# Patient Record
Sex: Female | Born: 1970 | Race: White | Hispanic: No | Smoking: Former smoker
Health system: Southern US, Community
[De-identification: ages and names within clinical notes are randomized; demographics above are authoritative.]

## PROBLEM LIST (undated history)

## (undated) DIAGNOSIS — G4700 Insomnia, unspecified: Secondary | ICD-10-CM

## (undated) DIAGNOSIS — F32A Depression, unspecified: Secondary | ICD-10-CM

## (undated) DIAGNOSIS — K1379 Other lesions of oral mucosa: Secondary | ICD-10-CM

## (undated) DIAGNOSIS — C801 Malignant (primary) neoplasm, unspecified: Secondary | ICD-10-CM

## (undated) DIAGNOSIS — J9610 Chronic respiratory failure, unspecified whether with hypoxia or hypercapnia: Secondary | ICD-10-CM

## (undated) DIAGNOSIS — G8929 Other chronic pain: Secondary | ICD-10-CM

## (undated) DIAGNOSIS — K219 Gastro-esophageal reflux disease without esophagitis: Secondary | ICD-10-CM

## (undated) DIAGNOSIS — F419 Anxiety disorder, unspecified: Secondary | ICD-10-CM

## (undated) DIAGNOSIS — J449 Chronic obstructive pulmonary disease, unspecified: Secondary | ICD-10-CM

## (undated) DIAGNOSIS — F329 Major depressive disorder, single episode, unspecified: Secondary | ICD-10-CM

## (undated) DIAGNOSIS — J309 Allergic rhinitis, unspecified: Secondary | ICD-10-CM

## (undated) DIAGNOSIS — M25569 Pain in unspecified knee: Secondary | ICD-10-CM

## (undated) DIAGNOSIS — M1990 Unspecified osteoarthritis, unspecified site: Secondary | ICD-10-CM

## (undated) DIAGNOSIS — M549 Dorsalgia, unspecified: Secondary | ICD-10-CM

## (undated) DIAGNOSIS — E079 Disorder of thyroid, unspecified: Secondary | ICD-10-CM

## (undated) HISTORY — PX: CHOLECYSTECTOMY: SHX55

## (undated) HISTORY — PX: TONSILLECTOMY: SUR1361

## (undated) HISTORY — PX: ABDOMINAL HYSTERECTOMY: SHX81

## (undated) HISTORY — PX: JOINT REPLACEMENT: SHX530

## (undated) HISTORY — PX: PORTACATH PLACEMENT: SHX2246

## (undated) HISTORY — PX: KNEE SURGERY: SHX244

---

## 1999-10-10 ENCOUNTER — Emergency Department (HOSPITAL_COMMUNITY): Admission: EM | Admit: 1999-10-10 | Discharge: 1999-10-10 | Payer: Self-pay | Admitting: Emergency Medicine

## 1999-10-29 ENCOUNTER — Emergency Department (HOSPITAL_COMMUNITY): Admission: EM | Admit: 1999-10-29 | Discharge: 1999-10-29 | Payer: Self-pay | Admitting: Emergency Medicine

## 2000-10-11 ENCOUNTER — Encounter: Payer: Self-pay | Admitting: Emergency Medicine

## 2000-10-11 ENCOUNTER — Emergency Department (HOSPITAL_COMMUNITY): Admission: EM | Admit: 2000-10-11 | Discharge: 2000-10-11 | Payer: Self-pay | Admitting: Emergency Medicine

## 2000-11-22 ENCOUNTER — Encounter: Payer: Self-pay | Admitting: Emergency Medicine

## 2000-11-22 ENCOUNTER — Emergency Department (HOSPITAL_COMMUNITY): Admission: EM | Admit: 2000-11-22 | Discharge: 2000-11-22 | Payer: Self-pay | Admitting: Emergency Medicine

## 2000-11-25 ENCOUNTER — Encounter: Payer: Self-pay | Admitting: *Deleted

## 2000-11-25 ENCOUNTER — Encounter (INDEPENDENT_AMBULATORY_CARE_PROVIDER_SITE_OTHER): Payer: Self-pay | Admitting: *Deleted

## 2000-11-25 ENCOUNTER — Inpatient Hospital Stay (HOSPITAL_COMMUNITY): Admission: EM | Admit: 2000-11-25 | Discharge: 2000-11-28 | Payer: Self-pay | Admitting: Emergency Medicine

## 2003-12-31 ENCOUNTER — Emergency Department (HOSPITAL_COMMUNITY): Admission: EM | Admit: 2003-12-31 | Discharge: 2003-12-31 | Payer: Self-pay | Admitting: Emergency Medicine

## 2004-02-27 ENCOUNTER — Emergency Department (HOSPITAL_COMMUNITY): Admission: EM | Admit: 2004-02-27 | Discharge: 2004-02-27 | Payer: Self-pay | Admitting: Emergency Medicine

## 2007-02-04 ENCOUNTER — Emergency Department (HOSPITAL_COMMUNITY): Admission: EM | Admit: 2007-02-04 | Discharge: 2007-02-04 | Payer: Self-pay | Admitting: Emergency Medicine

## 2007-02-26 ENCOUNTER — Ambulatory Visit (HOSPITAL_BASED_OUTPATIENT_CLINIC_OR_DEPARTMENT_OTHER): Admission: RE | Admit: 2007-02-26 | Discharge: 2007-02-26 | Payer: Self-pay | Admitting: Orthopedic Surgery

## 2007-03-19 ENCOUNTER — Encounter: Admission: RE | Admit: 2007-03-19 | Discharge: 2007-06-17 | Payer: Self-pay | Admitting: Orthopedic Surgery

## 2007-05-26 ENCOUNTER — Emergency Department (HOSPITAL_COMMUNITY): Admission: EM | Admit: 2007-05-26 | Discharge: 2007-05-26 | Payer: Self-pay | Admitting: Emergency Medicine

## 2009-01-02 ENCOUNTER — Ambulatory Visit: Payer: Self-pay | Admitting: Interventional Radiology

## 2009-01-02 ENCOUNTER — Emergency Department (HOSPITAL_BASED_OUTPATIENT_CLINIC_OR_DEPARTMENT_OTHER): Admission: EM | Admit: 2009-01-02 | Discharge: 2009-01-02 | Payer: Self-pay | Admitting: Emergency Medicine

## 2009-08-28 ENCOUNTER — Emergency Department (HOSPITAL_BASED_OUTPATIENT_CLINIC_OR_DEPARTMENT_OTHER): Admission: EM | Admit: 2009-08-28 | Discharge: 2009-08-28 | Payer: Self-pay | Admitting: Emergency Medicine

## 2010-02-27 ENCOUNTER — Emergency Department (HOSPITAL_COMMUNITY): Admission: EM | Admit: 2010-02-27 | Discharge: 2010-02-27 | Payer: Self-pay | Admitting: Emergency Medicine

## 2010-11-05 LAB — DIFFERENTIAL
Basophils Absolute: 0 10*3/uL (ref 0.0–0.1)
Basophils Relative: 1 % (ref 0–1)
Lymphocytes Relative: 31 % (ref 12–46)
Neutro Abs: 2.7 10*3/uL (ref 1.7–7.7)

## 2010-11-05 LAB — COMPREHENSIVE METABOLIC PANEL
Alkaline Phosphatase: 57 U/L (ref 39–117)
BUN: 7 mg/dL (ref 6–23)
CO2: 25 mEq/L (ref 19–32)
Chloride: 108 mEq/L (ref 96–112)
Creatinine, Ser: 0.83 mg/dL (ref 0.4–1.2)
GFR calc non Af Amer: >60 mL/min (ref >60)
Glucose, Bld: 103 mg/dL — ABNORMAL HIGH (ref 70–99)
Total Bilirubin: 0.8 mg/dL (ref 0.3–1.2)

## 2010-11-05 LAB — CBC
HCT: 34 % — ABNORMAL LOW (ref 36.0–46.0)
Hemoglobin: 11.5 g/dL — ABNORMAL LOW (ref 12.0–15.0)
MCH: 31.9 pg (ref 26.0–34.0)
MCV: 94.4 fL (ref 78.0–100.0)
WBC: 4.9 10*3/uL (ref 4.0–10.5)

## 2010-11-05 LAB — LIPASE, BLOOD: Lipase: 31 U/L (ref 11–59)

## 2010-11-05 LAB — URINALYSIS, ROUTINE W REFLEX MICROSCOPIC
Ketones, ur: NEGATIVE mg/dL
Nitrite: NEGATIVE
Protein, ur: NEGATIVE mg/dL

## 2010-11-08 ENCOUNTER — Other Ambulatory Visit: Payer: Self-pay | Admitting: Family Medicine

## 2010-11-08 DIAGNOSIS — Z09 Encounter for follow-up examination after completed treatment for conditions other than malignant neoplasm: Secondary | ICD-10-CM

## 2010-11-28 LAB — DIFFERENTIAL
Basophils Absolute: 0.1 10*3/uL (ref 0.0–0.1)
Basophils Relative: 1 % (ref 0–1)
Lymphocytes Relative: 20 % (ref 12–46)
Monocytes Absolute: 0.4 10*3/uL (ref 0.1–1.0)
Neutro Abs: 5.6 10*3/uL (ref 1.7–7.7)
Neutrophils Relative %: 72 % (ref 43–77)

## 2010-11-28 LAB — URINALYSIS, ROUTINE W REFLEX MICROSCOPIC
Glucose, UA: NEGATIVE mg/dL
Ketones, ur: NEGATIVE mg/dL
Nitrite: NEGATIVE
Protein, ur: NEGATIVE mg/dL
Urobilinogen, UA: 0.2 mg/dL (ref 0.0–1.0)

## 2010-11-28 LAB — SEDIMENTATION RATE: Sed Rate: 27 mm/hr — ABNORMAL HIGH (ref 0–22)

## 2010-11-28 LAB — BASIC METABOLIC PANEL
Calcium: 9 mg/dL (ref 8.4–10.5)
Creatinine, Ser: 0.8 mg/dL (ref 0.4–1.2)
GFR calc Af Amer: >60 mL/min (ref >60)
GFR calc non Af Amer: >60 mL/min (ref >60)
Glucose, Bld: 87 mg/dL (ref 70–99)
Sodium: 140 mEq/L (ref 135–145)

## 2010-11-28 LAB — PREGNANCY, URINE: Preg Test, Ur: NEGATIVE

## 2010-11-28 LAB — CBC
Hemoglobin: 11.9 g/dL — ABNORMAL LOW (ref 12.0–15.0)
MCHC: 32.8 g/dL (ref 30.0–36.0)
RBC: 4.15 MIL/uL (ref 3.87–5.11)
RDW: 15.2 % (ref 11.5–15.5)

## 2010-12-18 ENCOUNTER — Emergency Department (HOSPITAL_COMMUNITY)
Admission: EM | Admit: 2010-12-18 | Discharge: 2010-12-18 | Disposition: A | Discharge status: Home / Self Care | Payer: Medicaid Other | Attending: Emergency Medicine | Admitting: Emergency Medicine

## 2010-12-18 ENCOUNTER — Emergency Department (HOSPITAL_COMMUNITY): Payer: Medicaid Other

## 2010-12-18 DIAGNOSIS — R197 Diarrhea, unspecified: Secondary | ICD-10-CM | POA: Insufficient documentation

## 2010-12-18 DIAGNOSIS — E039 Hypothyroidism, unspecified: Secondary | ICD-10-CM | POA: Insufficient documentation

## 2010-12-18 DIAGNOSIS — J45909 Unspecified asthma, uncomplicated: Secondary | ICD-10-CM | POA: Insufficient documentation

## 2010-12-18 DIAGNOSIS — R112 Nausea with vomiting, unspecified: Secondary | ICD-10-CM | POA: Insufficient documentation

## 2010-12-18 DIAGNOSIS — K5732 Diverticulitis of large intestine without perforation or abscess without bleeding: Secondary | ICD-10-CM | POA: Insufficient documentation

## 2010-12-18 DIAGNOSIS — F341 Dysthymic disorder: Secondary | ICD-10-CM | POA: Insufficient documentation

## 2010-12-18 DIAGNOSIS — R109 Unspecified abdominal pain: Secondary | ICD-10-CM | POA: Insufficient documentation

## 2010-12-18 DIAGNOSIS — Z79899 Other long term (current) drug therapy: Secondary | ICD-10-CM | POA: Insufficient documentation

## 2010-12-18 LAB — HEPATIC FUNCTION PANEL
Albumin: 3.9 g/dL (ref 3.5–5.2)
Bilirubin, Direct: <0.1 mg/dL (ref 0.0–0.3)
Total Bilirubin: 0.8 mg/dL (ref 0.3–1.2)

## 2010-12-18 LAB — POCT PREGNANCY, URINE: Preg Test, Ur: NEGATIVE

## 2010-12-18 LAB — BASIC METABOLIC PANEL
BUN: 3 mg/dL — ABNORMAL LOW (ref 6–23)
Chloride: 110 mEq/L (ref 96–112)
GFR calc Af Amer: >60 mL/min (ref >60)
GFR calc non Af Amer: >60 mL/min (ref >60)
Potassium: 3.2 mEq/L — ABNORMAL LOW (ref 3.5–5.1)

## 2010-12-18 LAB — DIFFERENTIAL
Basophils Absolute: 0 10*3/uL (ref 0.0–0.1)
Basophils Relative: 0 % (ref 0–1)
Eosinophils Absolute: 0.5 10*3/uL (ref 0.0–0.7)
Eosinophils Relative: 6 % — ABNORMAL HIGH (ref 0–5)
Neutrophils Relative %: 60 % (ref 43–77)

## 2010-12-18 LAB — URINALYSIS, ROUTINE W REFLEX MICROSCOPIC
Ketones, ur: 15 mg/dL — AB
Nitrite: NEGATIVE
Specific Gravity, Urine: 1.025 (ref 1.005–1.030)
Urobilinogen, UA: 0.2 mg/dL (ref 0.0–1.0)
pH: 6 (ref 5.0–8.0)

## 2010-12-18 LAB — CBC
MCV: 90.9 fL (ref 78.0–100.0)
Platelets: 277 10*3/uL (ref 150–400)
RBC: 4.28 MIL/uL (ref 3.87–5.11)
RDW: 14.2 % (ref 11.5–15.5)
WBC: 7.8 10*3/uL (ref 4.0–10.5)

## 2010-12-18 LAB — LIPASE, BLOOD: Lipase: 35 U/L (ref 11–59)

## 2011-01-02 NOTE — Op Note (Signed)
NAMEKATELY, GRAFFAM               ACCOUNT NO.:  [id number]   MEDICAL RECORD NO.:  [id number]          PATIENT TYPE:  AMB   LOCATION:  DSC                          FACILITY:  [facility]   PHYSICIAN:  Dyke Brackett, M.D.    DATE OF BIRTH:  December 04, 1970   DATE OF PROCEDURE:  02/26/2007  DATE OF DISCHARGE:                               OPERATIVE REPORT   PREOPERATIVE DIAGNOSES:  1. Anterior cruciate ligament tear.  2. Osteoarthritis knee.  3. Medial and lateral meniscal tear.   POSTOPERATIVE DIAGNOSES:  1. Anterior cruciate ligament tear.  2. Osteoarthritis knee.  3. Medial and lateral meniscal tear.   PROCEDURE:  1. Partial medial meniscectomy (posterior 50%).  2. Partial lateral meniscectomy (20%).  3. Tri-compartmental debridement.  4. Debridement anterior cruciate ligament stump.   SURGEON:  Dyke Brackett, M.D.   ASSISTANT:  P.A. Clark   INDICATIONS:  A 40 year old with MRI-proven degenerative knee meniscal  tearing and ACL insufficiency.  She was advised that based on the degree  of degenerative change that we may consider reconstruction but if she  had a significant amount of change this would not be feasible.   DESCRIPTION OF PROCEDURE:  The patient had a 3 to 4+ Lachman and a  positive pivot shift.  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  Unfortunately she had significant degenerative change in the  patellofemoral joint, grade 3 borderline grade 4 changes in the  patellofemoral joint.  Likewise, there were corresponding changes  particularly over the femoral condyle medially with a very large  irregularly shaped posterior horn meniscus tear.  It was my judgment  based on the degree of significant osteoarthritis in the patellofemoral  joint and the medial compartment that ACL reconstruction was  contraindicated.  There was mild degenerative change and the beginning  of degenerative change on the lateral compartment.  The posterior horn  of the lateral meniscus was torn as  well requiring debridment of about  20 to 30% of the meniscus substance.  Again, bilateral meniscectomy was  carried out, approximately 40 to 50% of the meniscus removed medially,  and aggressive debridement of the patellofemoral joint  laterally and medially.  The knee was drained free of fluid.  The portals were closed with nylon.  A lightly compressive sterile  dressing was applied.  The patient was taken to the recovery room in  stable condition.      Dyke Brackett, M.D.  Electronically Signed     WDC/MEDQ  D:  02/26/2007  T:  02/27/2007  Job:  [id number]

## 2011-01-05 NOTE — H&P (Signed)
[facility]  Patient:    Debra [name], Debra [name]                      MRN: [id number] Adm. Date:  [id number] Attending:  Sharyn Dross                         History and Physical  CHIEF COMPLAINT: This 40 year old white female was admitted to the hospital for a three-plus week history of diarrhea as well as lower gastrointestinal bleeding.  HISTORY OF PRESENT ILLNESS: The patient states her symptoms started approximately three weeks to a month ago when she started having diarrhea, which has progressively worsened since that time.  She had done to see her primary physician approximately a week after the event started, who advised her she may be dealing with a viral process.  She was treated conservatively but the diarrhea persisted at this time.  She remained home for approximately one week but after she remained home the diarrhea gradually subsided.  She returned to work and within four days after returning to work the diarrhea progressively worsened.  She again went to her primary physician, who felt this may be related to a gastroenteritis process but the treatment remained the same.  She persistently had diarrhea over the course of this time with watery stools throughout.  Initially the stools were clear to greenish in color but approximately three days prior to admission the patient started having bloody stools.  Associated with the bloody stools was crampy infraumbilical pains as well as pains in the left lower quadrant region.  FAMILY HISTORY: There is no family history of inflammatory bowel disease that is noted.  There is no history of any ulcerative colitis or Crohns process, and no history of any infectious process that is ongoing.  SOCIAL HISTORY: The patient denies any ETOH abuse or use at this time or any major smoking process.  She does have a child and she is a single parent, working gainfully at this time.  CURRENT MEDICATIONS:  1.  Flagyl.  2. Vicoprofen.  She was initially to be started on Cipro but in discussion with the EDP prior to my coming down here it was felt I needed to evaluate the patient prior to the use of Cipro being given.  REVIEW OF SYSTEMS: Her Review Of Systems is essentially noncontributory at the moment.  PHYSICAL EXAMINATION:  GENERAL: She is a pleasant female resting comfortably on a stretcher in the emergency room.  VITAL SIGNS: Stable.  HEENT: Negative.  NECK: Supple.  LUNGS: Clear.  HEART: Regular rate and rhythm without heaves, thrills, murmurs, or gallops.  ABDOMEN: Soft.  Positive tenderness to palpation in the infraumbilical region as well as in the left lower quadrant.  No rebound or referred tenderness noted.  RECTAL: Digital examination deferred today.  EXTREMITIES: No clubbing, cyanosis, or edema.  LABORATORY DATA: Presently not available at this time.  X-ray studies done from her previous emergency room evaluation approximately three days ago showed evidence of diffuse edema present throughout the bowel parenchyma tissue.  The differential was considered with pseudomembranous colitis versus inflammatory bowel process.  IMPRESSION:  1. Lower gastrointestinal bleeding.  2. Possibly inflammatory bowel disease.  Rule out other causes at this time.  PLAN:  1. I am going to admit the patient to the hospital for the diarrhea.  2. Stool for analysis.  3. Depending upon what is evaluated colonoscopy in the  a.m. and depending     upon results will determine the course of therapy. DD:  11/25/00 TD:  11/26/00 Job: [id number]

## 2011-01-05 NOTE — Discharge Summary (Signed)
. [facility]  Patient:    Debra [name], Debra [name]                      MRN: [id number] Adm. Date:  [id number] Disc. Date: 11/28/00 Attending:  Sharyn Dross                           Discharge Summary  ADMISSION DIAGNOSIS:  Diarrhea with bloody diarrhea.  DISCHARGE DIAGNOSIS:  Ulcerative colitis, diffuse.  CONDITION ON DISCHARGE:  Stable and improved.  DISCHARGE MEDICATIONS:  Azulfidine 500 mg up to q.i.d.  COMPLICATIONS:  None.  CONSULTING PHYSICIANS:  None.  FOLLOW-UP:  The patient is to follow up with me in two weeks.  PROCEDURE:  November 26, 2000, colonoscopy.  Results; diffuse colitis, ulcerative, to the proximal transverse colon, normal colon from ascending colon to cecum.  HOSPITAL COURSE:  The patient was admitted into the hospital after evaluation of the patient in the emergency room.  Once it was known that the patient had evidence of diarrhea for a months period of time, she was brought in for analysis of her stools as well as colonoscopic examination.  Colonoscopy report was consistent with diffuse colitis that was present at this time from the rectosigmoid through to the proximal transverse colon that was noted. Random biopsies were taken, but the pathology reports presently are not available yet.  The patient was started with Azulfidine on a q.d. basis at this time.  She has done relatively well and her diarrhea has gradually showed evidence of good improvement that was noted.  The patient will be discharged from the hospital today to increase her Azulfidine every three days to a q.i.d. dose.  She will be maintained on a q.i.d. dose for approximately on month, whereabouts she will undergo a repeat colonoscopic examination to see the healing effects of this medication.  She will continually be taking a low residue diet which should help to put the bowel at rest and help to improve for the at least the next two weeks.  She will follow  up to see me in approximately two weeks for a reevaluation. DD:  11/28/00 TD:  11/28/00 Job: [id number]

## 2011-02-17 ENCOUNTER — Emergency Department (HOSPITAL_COMMUNITY)
Admission: EM | Admit: 2011-02-17 | Discharge: 2011-02-17 | Disposition: A | Discharge status: Home / Self Care | Payer: Medicaid Other | Attending: Emergency Medicine | Admitting: Emergency Medicine

## 2011-02-17 DIAGNOSIS — R4182 Altered mental status, unspecified: Secondary | ICD-10-CM | POA: Insufficient documentation

## 2011-02-17 DIAGNOSIS — F341 Dysthymic disorder: Secondary | ICD-10-CM | POA: Insufficient documentation

## 2011-02-17 DIAGNOSIS — J45909 Unspecified asthma, uncomplicated: Secondary | ICD-10-CM | POA: Insufficient documentation

## 2011-02-17 DIAGNOSIS — Z79899 Other long term (current) drug therapy: Secondary | ICD-10-CM | POA: Insufficient documentation

## 2011-02-17 DIAGNOSIS — E039 Hypothyroidism, unspecified: Secondary | ICD-10-CM | POA: Insufficient documentation

## 2011-02-17 LAB — RAPID URINE DRUG SCREEN, HOSP PERFORMED
Barbiturates: NOT DETECTED
Benzodiazepines: NOT DETECTED
Cocaine: POSITIVE — AB

## 2011-02-17 LAB — CBC
Hemoglobin: 12.5 g/dL (ref 12.0–15.0)
Platelets: 227 10*3/uL (ref 150–400)
RBC: 4.08 MIL/uL (ref 3.87–5.11)
WBC: 8.8 10*3/uL (ref 4.0–10.5)

## 2011-02-17 LAB — DIFFERENTIAL
Basophils Relative: 0 % (ref 0–1)
Eosinophils Absolute: 0.3 10*3/uL (ref 0.0–0.7)
Neutro Abs: 5.9 10*3/uL (ref 1.7–7.7)
Neutrophils Relative %: 67 % (ref 43–77)

## 2011-02-17 LAB — COMPREHENSIVE METABOLIC PANEL
CO2: 18 mEq/L — ABNORMAL LOW (ref 19–32)
Calcium: 9 mg/dL (ref 8.4–10.5)
Creatinine, Ser: 1.23 mg/dL — ABNORMAL HIGH (ref 0.50–1.10)
GFR calc Af Amer: 58 mL/min — ABNORMAL LOW (ref >60)
GFR calc non Af Amer: 48 mL/min — ABNORMAL LOW (ref >60)
Glucose, Bld: 78 mg/dL (ref 70–99)

## 2011-06-05 LAB — BASIC METABOLIC PANEL
GFR calc Af Amer: >60
GFR calc non Af Amer: >60
Potassium: 3.6
Sodium: 138

## 2011-06-05 LAB — POCT HEMOGLOBIN-HEMACUE: Operator id: 208731

## 2011-07-07 ENCOUNTER — Other Ambulatory Visit: Payer: Self-pay

## 2011-07-07 ENCOUNTER — Emergency Department (INDEPENDENT_AMBULATORY_CARE_PROVIDER_SITE_OTHER): Payer: Medicaid Other

## 2011-07-07 ENCOUNTER — Emergency Department (HOSPITAL_BASED_OUTPATIENT_CLINIC_OR_DEPARTMENT_OTHER)
Admission: EM | Admit: 2011-07-07 | Discharge: 2011-07-07 | Disposition: A | Discharge status: Home / Self Care | Payer: Medicaid Other | Attending: Emergency Medicine | Admitting: Emergency Medicine

## 2011-07-07 ENCOUNTER — Encounter: Payer: Self-pay | Admitting: Emergency Medicine

## 2011-07-07 DIAGNOSIS — R0602 Shortness of breath: Secondary | ICD-10-CM | POA: Insufficient documentation

## 2011-07-07 DIAGNOSIS — E079 Disorder of thyroid, unspecified: Secondary | ICD-10-CM | POA: Insufficient documentation

## 2011-07-07 DIAGNOSIS — F341 Dysthymic disorder: Secondary | ICD-10-CM | POA: Insufficient documentation

## 2011-07-07 DIAGNOSIS — J40 Bronchitis, not specified as acute or chronic: Secondary | ICD-10-CM | POA: Insufficient documentation

## 2011-07-07 DIAGNOSIS — J45909 Unspecified asthma, uncomplicated: Secondary | ICD-10-CM | POA: Insufficient documentation

## 2011-07-07 DIAGNOSIS — Z79899 Other long term (current) drug therapy: Secondary | ICD-10-CM | POA: Insufficient documentation

## 2011-07-07 DIAGNOSIS — R079 Chest pain, unspecified: Secondary | ICD-10-CM | POA: Insufficient documentation

## 2011-07-07 DIAGNOSIS — G8929 Other chronic pain: Secondary | ICD-10-CM | POA: Insufficient documentation

## 2011-07-07 DIAGNOSIS — R0789 Other chest pain: Secondary | ICD-10-CM

## 2011-07-07 HISTORY — DX: Dorsalgia, unspecified: M54.9

## 2011-07-07 HISTORY — DX: Major depressive disorder, single episode, unspecified: F32.9

## 2011-07-07 HISTORY — DX: Anxiety disorder, unspecified: F41.9

## 2011-07-07 HISTORY — DX: Depression, unspecified: F32.A

## 2011-07-07 HISTORY — DX: Pain in unspecified knee: M25.569

## 2011-07-07 HISTORY — DX: Other chronic pain: G89.29

## 2011-07-07 HISTORY — DX: Disorder of thyroid, unspecified: E07.9

## 2011-07-07 MED ORDER — ALBUTEROL SULFATE (5 MG/ML) 0.5% IN NEBU
5.0000 mg | INHALATION_SOLUTION | Freq: Once | RESPIRATORY_TRACT | Status: AC
  Administered 2011-07-07: 5 mg via RESPIRATORY_TRACT
  Filled 2011-07-07: qty 1

## 2011-07-07 MED ORDER — PREDNISONE 20 MG PO TABS
ORAL_TABLET | ORAL | Status: AC
  Administered 2011-07-07: 60 mg
  Filled 2011-07-07: qty 3

## 2011-07-07 MED ORDER — OXYCODONE-ACETAMINOPHEN 5-325 MG PO TABS
1.0000 | ORAL_TABLET | Freq: Once | ORAL | Status: AC
  Administered 2011-07-07: 1 via ORAL
  Filled 2011-07-07: qty 1

## 2011-07-07 MED ORDER — PREDNISONE 50 MG PO TABS: 60.0000 mg | ORAL_TABLET | Freq: Once | ORAL | Status: DC

## 2011-07-07 MED ORDER — ONDANSETRON 8 MG PO TBDP
8.0000 mg | ORAL_TABLET | Freq: Once | ORAL | Status: AC
  Administered 2011-07-07: 8 mg via ORAL
  Filled 2011-07-07: qty 1

## 2011-07-07 MED ORDER — PREDNISONE 20 MG PO TABS: 60.0000 mg | ORAL_TABLET | Freq: Every day | ORAL | Status: AC

## 2011-07-07 MED ORDER — IPRATROPIUM BROMIDE 0.02 % IN SOLN
0.5000 mg | Freq: Once | RESPIRATORY_TRACT | Status: AC
  Administered 2011-07-07: 0.5 mg via RESPIRATORY_TRACT
  Filled 2011-07-07: qty 2.5

## 2011-07-07 MED ORDER — NAPROXEN 375 MG PO TABS: 375.0000 mg | ORAL_TABLET | Freq: Two times a day (BID) | ORAL | Status: AC

## 2011-07-07 MED ORDER — PREDNISONE 50 MG PO TABS: 60.0000 mg | ORAL_TABLET | Freq: Every day | ORAL | Status: DC

## 2011-07-07 NOTE — ED Provider Notes (Signed)
History     CSN: [id number] Arrival date & time: 07/07/2011 11:26 AM   First MD Initiated Contact with Patient 07/07/11 1149      Chief Complaint  Patient presents with  . Shortness of Breath  . Chest Pain  . Cough    (Consider location/radiation/quality/duration/timing/severity/associated sxs/prior treatment) HPI Comments: Patient has seen her Dr. and has been prescribed antibiotics twice in the last few months. She does not feel like it's been helping that much. She has been taking her albuterol treatments as well is her Singulair. Patient is a having a sharp pain in the center of her chest increased with coughing.  She has not noticed any leg swelling or pain. She does not have history of heart disease or blood clots the  Patient is a 40 y.o. female presenting with shortness of breath, chest pain, and cough. The history is provided by the patient.  Shortness of Breath  The current episode started more than 2 weeks ago. The onset was gradual. The problem occurs continuously. The problem has been unchanged. The problem is moderate. The symptoms are relieved by nothing. The symptoms are aggravated by activity. Associated symptoms include chest pain, cough and shortness of breath. The cough is non-productive. There is no color change associated with the cough. Nothing relieves the cough. Her past medical history is significant for asthma.  Chest Pain Primary symptoms include shortness of breath and cough.  The patient's medical history is significant for asthma.    Cough Associated symptoms include chest pain and shortness of breath. Her past medical history is significant for asthma.    Past Medical History  Diagnosis Date  . Asthma   . Ulcerative colitis   . Anxiety   . Depression   . Chronic back pain   . Chronic knee pain   . Thyroid disease     hypo    Past Surgical History  Procedure Date  . Knee surgery   . Cholecystectomy   . Abdominal hysterectomy   . Cesarean  section     History reviewed. No pertinent family history.  History  Substance Use Topics  . Smoking status: Current Everyday Smoker  . Smokeless tobacco: Not on file  . Alcohol Use: No    OB History    Grav Para Term Preterm Abortions TAB SAB Ect Mult Living                  Review of Systems  Respiratory: Positive for cough and shortness of breath.   Cardiovascular: Positive for chest pain.    Allergies  Penicillins  Home Medications   Current Outpatient Rx  Name Route Sig Dispense Refill  . ALBUTEROL SULFATE (2.5 MG/3ML) 0.083% IN NEBU Nebulization Take 2.5 mg by nebulization every 6 (six) hours as needed.      Maximino Greenland 18-103 MCG/ACT IN AERO Inhalation Inhale 2 puffs into the lungs every 6 (six) hours as needed.      . ALPRAZOLAM 0.25 MG PO TABS Oral Take 0.25 mg by mouth at bedtime as needed.      Brigitte Pulse XL PO Oral Take by mouth.      Marland Kitchen CITALOPRAM HYDROBROMIDE 10 MG PO TABS Oral Take 10 mg by mouth daily.      Marland Kitchen ESOMEPRAZOLE MAGNESIUM 40 MG PO CPDR Oral Take 40 mg by mouth daily before breakfast.      . HYDROCODONE-ACETAMINOPHEN 10-325 MG PO TABS Oral Take 1 tablet by mouth every 6 (six) hours as needed.      Marland Kitchen  LEVOTHYROXINE SODIUM 125 MCG PO TABS Oral Take 125 mcg by mouth daily.      . MORPHINE SULFATE 30 MG PO TABS Oral Take 30 mg by mouth 2 (two) times daily.      Marland Kitchen MOXIFLOXACIN HCL 400 MG PO TABS Oral Take 400 mg by mouth daily.      . TRAZODONE HCL 100 MG PO TABS Oral Take 100 mg by mouth at bedtime.      Marland Kitchen MONTELUKAST SODIUM 10 MG PO TABS Oral Take 10 mg by mouth at bedtime.        BP 109/61  Pulse 56  Temp(Src) 98 F (36.7 C) (Oral)  Resp 18  Ht 5\' 2"  (1.575 m)  Wt 223 lb (101.152 kg)  BMI 40.79 kg/m2  SpO2 100%  Physical Exam  Nursing note and vitals reviewed. Constitutional: She appears well-developed and well-nourished. No distress.  HENT:  Head: Normocephalic and atraumatic.  Right Ear: External ear normal.  Left Ear:  External ear normal.  Eyes: Conjunctivae are normal. Right eye exhibits no discharge. Left eye exhibits no discharge. No scleral icterus.  Neck: Neck supple. No tracheal deviation present.  Cardiovascular: Normal rate, regular rhythm and intact distal pulses.   Pulmonary/Chest: Effort normal. No stridor. No respiratory distress. She has wheezes. She has no rales.  Abdominal: Soft. Bowel sounds are normal. She exhibits no distension. There is no tenderness. There is no rebound and no guarding.  Musculoskeletal: She exhibits no edema and no tenderness.  Neurological: She is alert. She has normal strength. No sensory deficit. Cranial nerve deficit:  no gross defecits noted. She exhibits normal muscle tone. She displays no seizure activity. Coordination normal.  Skin: Skin is warm and dry. No rash noted.  Psychiatric: She has a normal mood and affect.    ED Course  Procedures (including critical care time)   Medications                          predniSONE (DELTASONE) tablet 60 mg (  Oral Canceled Entry 07/07/11 1229)  albuterol (PROVENTIL) (5 MG/ML) 0.5% nebulizer solution 5 mg (5 mg Nebulization Given 07/07/11 1207)  ipratropium (ATROVENT) nebulizer solution 0.5 mg (0.5 mg Nebulization Given 07/07/11 1207)  predniSONE (DELTASONE) 20 MG tablet (60 mg  Given 07/07/11 1227)    1:19 PM patient feeling better after breathing treatment. Repeat exams have any further wheezing    Date: 07/07/2011  Rate: 55  Rhythm: sinus bradycardia  QRS Axis: normal  Intervals: QT prolonged  ST/T Wave abnormalities: normal  Conduction Disutrbances:none  Narrative Interpretation:   Old EKG Reviewed: unchanged   Labs Reviewed - No data to display Dg Chest 2 View  07/07/2011  *RADIOLOGY REPORT*  Clinical Data: Midsternal chest pain and shortness of breath  CHEST - 2 VIEW  Comparison: 02/27/2004  Findings: Mild cardiomegaly again noted. Lung volumes are low with crowding of the bronchovascular  markings.  No focal pulmonary opacity otherwise.  No pleural effusion.  No acute osseous abnormality.  IMPRESSION: Low volumes with bibasilar dependent probable atelectasis.  No focal acute finding.  Original Report Authenticated By: Harrel Lemon, M.D.      MDM  Patient with bronchitis. There is no evidence of pneumonia. She has some mild wheezing but there does not appear to be any evidence of a severe exacerbation. Patient discharged home with  course of steroids. At This point without pneumonia on chest x-ray there is not need  for  another course of antibiotics.    Celene Kras, MD 07/07/11 [phone]

## 2011-07-07 NOTE — ED Notes (Signed)
Pt states she is having chest tightness and SOB.  Is currently being treated for URI by her MD.  Pt states she is not getting better.

## 2012-09-18 ENCOUNTER — Inpatient Hospital Stay (HOSPITAL_COMMUNITY)
Admission: EM | Admit: 2012-09-18 | Discharge: 2012-09-22 | DRG: 193 | Disposition: A | Discharge status: Home / Self Care | Payer: Medicaid Other | Attending: Internal Medicine | Admitting: Internal Medicine

## 2012-09-18 ENCOUNTER — Encounter (HOSPITAL_COMMUNITY): Payer: Self-pay | Admitting: *Deleted

## 2012-09-18 ENCOUNTER — Emergency Department (HOSPITAL_COMMUNITY): Payer: Medicaid Other

## 2012-09-18 DIAGNOSIS — F3289 Other specified depressive episodes: Secondary | ICD-10-CM | POA: Diagnosis present

## 2012-09-18 DIAGNOSIS — J45902 Unspecified asthma with status asthmaticus: Secondary | ICD-10-CM

## 2012-09-18 DIAGNOSIS — E079 Disorder of thyroid, unspecified: Secondary | ICD-10-CM | POA: Diagnosis present

## 2012-09-18 DIAGNOSIS — B370 Candidal stomatitis: Secondary | ICD-10-CM | POA: Diagnosis present

## 2012-09-18 DIAGNOSIS — R7309 Other abnormal glucose: Secondary | ICD-10-CM | POA: Diagnosis not present

## 2012-09-18 DIAGNOSIS — D72829 Elevated white blood cell count, unspecified: Secondary | ICD-10-CM

## 2012-09-18 DIAGNOSIS — M129 Arthropathy, unspecified: Secondary | ICD-10-CM | POA: Diagnosis present

## 2012-09-18 DIAGNOSIS — Z90710 Acquired absence of both cervix and uterus: Secondary | ICD-10-CM

## 2012-09-18 DIAGNOSIS — G4700 Insomnia, unspecified: Secondary | ICD-10-CM | POA: Diagnosis present

## 2012-09-18 DIAGNOSIS — G8929 Other chronic pain: Secondary | ICD-10-CM | POA: Diagnosis present

## 2012-09-18 DIAGNOSIS — E039 Hypothyroidism, unspecified: Secondary | ICD-10-CM | POA: Diagnosis present

## 2012-09-18 DIAGNOSIS — J9600 Acute respiratory failure, unspecified whether with hypoxia or hypercapnia: Secondary | ICD-10-CM | POA: Diagnosis present

## 2012-09-18 DIAGNOSIS — M549 Dorsalgia, unspecified: Secondary | ICD-10-CM | POA: Diagnosis present

## 2012-09-18 DIAGNOSIS — Z6841 Body Mass Index (BMI) 40.0 and over, adult: Secondary | ICD-10-CM

## 2012-09-18 DIAGNOSIS — J13 Pneumonia due to Streptococcus pneumoniae: Principal | ICD-10-CM | POA: Diagnosis present

## 2012-09-18 DIAGNOSIS — J154 Pneumonia due to other streptococci: Secondary | ICD-10-CM

## 2012-09-18 DIAGNOSIS — K219 Gastro-esophageal reflux disease without esophagitis: Secondary | ICD-10-CM | POA: Diagnosis present

## 2012-09-18 DIAGNOSIS — D649 Anemia, unspecified: Secondary | ICD-10-CM

## 2012-09-18 DIAGNOSIS — F411 Generalized anxiety disorder: Secondary | ICD-10-CM | POA: Diagnosis present

## 2012-09-18 DIAGNOSIS — M1990 Unspecified osteoarthritis, unspecified site: Secondary | ICD-10-CM | POA: Diagnosis present

## 2012-09-18 DIAGNOSIS — J309 Allergic rhinitis, unspecified: Secondary | ICD-10-CM | POA: Diagnosis present

## 2012-09-18 DIAGNOSIS — J189 Pneumonia, unspecified organism: Secondary | ICD-10-CM

## 2012-09-18 DIAGNOSIS — Z8719 Personal history of other diseases of the digestive system: Secondary | ICD-10-CM

## 2012-09-18 DIAGNOSIS — F32A Depression, unspecified: Secondary | ICD-10-CM | POA: Diagnosis present

## 2012-09-18 DIAGNOSIS — Z22322 Carrier or suspected carrier of Methicillin resistant Staphylococcus aureus: Secondary | ICD-10-CM

## 2012-09-18 DIAGNOSIS — Z79899 Other long term (current) drug therapy: Secondary | ICD-10-CM

## 2012-09-18 DIAGNOSIS — N766 Ulceration of vulva: Secondary | ICD-10-CM | POA: Diagnosis present

## 2012-09-18 DIAGNOSIS — F329 Major depressive disorder, single episode, unspecified: Secondary | ICD-10-CM | POA: Diagnosis present

## 2012-09-18 DIAGNOSIS — E669 Obesity, unspecified: Secondary | ICD-10-CM | POA: Diagnosis present

## 2012-09-18 DIAGNOSIS — G4733 Obstructive sleep apnea (adult) (pediatric): Secondary | ICD-10-CM | POA: Diagnosis present

## 2012-09-18 DIAGNOSIS — J9601 Acute respiratory failure with hypoxia: Secondary | ICD-10-CM

## 2012-09-18 DIAGNOSIS — Z9089 Acquired absence of other organs: Secondary | ICD-10-CM

## 2012-09-18 DIAGNOSIS — M25569 Pain in unspecified knee: Secondary | ICD-10-CM | POA: Diagnosis present

## 2012-09-18 DIAGNOSIS — T380X5A Adverse effect of glucocorticoids and synthetic analogues, initial encounter: Secondary | ICD-10-CM | POA: Diagnosis not present

## 2012-09-18 DIAGNOSIS — F419 Anxiety disorder, unspecified: Secondary | ICD-10-CM | POA: Diagnosis present

## 2012-09-18 DIAGNOSIS — Z880 Allergy status to penicillin: Secondary | ICD-10-CM

## 2012-09-18 HISTORY — DX: Unspecified osteoarthritis, unspecified site: M19.90

## 2012-09-18 HISTORY — DX: Allergic rhinitis, unspecified: J30.9

## 2012-09-18 HISTORY — DX: Gastro-esophageal reflux disease without esophagitis: K21.9

## 2012-09-18 HISTORY — DX: Insomnia, unspecified: G47.00

## 2012-09-18 LAB — POCT I-STAT, CHEM 8
BUN: 7 mg/dL (ref 6–23)
Calcium, Ion: 1.18 mmol/L (ref 1.12–1.23)
Creatinine, Ser: 1.2 mg/dL — ABNORMAL HIGH (ref 0.50–1.10)
Hemoglobin: 14.3 g/dL (ref 12.0–15.0)
Sodium: 138 mEq/L (ref 135–145)
TCO2: 27 mmol/L (ref 0–100)

## 2012-09-18 LAB — CBC
MCH: 28.2 pg (ref 26.0–34.0)
MCHC: 31.3 g/dL (ref 30.0–36.0)
MCV: 90.1 fL (ref 78.0–100.0)
Platelets: 261 10*3/uL (ref 150–400)

## 2012-09-18 LAB — INFLUENZA PANEL BY PCR (TYPE A & B)
H1N1 flu by pcr: NOT DETECTED
Influenza B By PCR: NEGATIVE

## 2012-09-18 LAB — MRSA PCR SCREENING: MRSA by PCR: POSITIVE — AB

## 2012-09-18 MED ORDER — PANTOPRAZOLE SODIUM 40 MG PO TBEC
80.0000 mg | DELAYED_RELEASE_TABLET | Freq: Every day | ORAL | Status: DC
  Administered 2012-09-18 – 2012-09-21 (×4): 80 mg via ORAL
  Filled 2012-09-18 (×4): qty 2

## 2012-09-18 MED ORDER — BUPROPION HCL ER (XL) 300 MG PO TB24
300.0000 mg | ORAL_TABLET | Freq: Every day | ORAL | Status: DC
Start: 2012-09-18 — End: 2012-09-22
  Administered 2012-09-18 – 2012-09-22 (×5): 300 mg via ORAL
  Filled 2012-09-18 (×5): qty 1

## 2012-09-18 MED ORDER — CHLORHEXIDINE GLUCONATE CLOTH 2 % EX PADS
6.0000 | MEDICATED_PAD | Freq: Every day | CUTANEOUS | Status: DC
  Administered 2012-09-19 – 2012-09-21 (×3): 6 via TOPICAL

## 2012-09-18 MED ORDER — IPRATROPIUM BROMIDE 0.02 % IN SOLN
500.0000 ug | Freq: Four times a day (QID) | RESPIRATORY_TRACT | Status: DC
  Administered 2012-09-18 – 2012-09-20 (×8): 500 ug via RESPIRATORY_TRACT
  Filled 2012-09-18 (×8): qty 2.5

## 2012-09-18 MED ORDER — CITALOPRAM HYDROBROMIDE 40 MG PO TABS
40.0000 mg | ORAL_TABLET | Freq: Every day | ORAL | Status: DC
  Administered 2012-09-18 – 2012-09-22 (×5): 40 mg via ORAL
  Filled 2012-09-18 (×5): qty 1

## 2012-09-18 MED ORDER — MONTELUKAST SODIUM 10 MG PO TABS
10.0000 mg | ORAL_TABLET | Freq: Every day | ORAL | Status: DC
  Administered 2012-09-18 – 2012-09-21 (×4): 10 mg via ORAL
  Filled 2012-09-18 (×5): qty 1

## 2012-09-18 MED ORDER — MAGIC MOUTHWASH
10.0000 mL | Freq: Three times a day (TID) | ORAL | Status: DC
  Administered 2012-09-18 – 2012-09-22 (×14): 10 mL via ORAL
  Filled 2012-09-18 (×19): qty 10

## 2012-09-18 MED ORDER — MUPIROCIN 2 % EX OINT
1.0000 "application " | TOPICAL_OINTMENT | Freq: Two times a day (BID) | CUTANEOUS | Status: DC
  Administered 2012-09-18 – 2012-09-22 (×8): 1 via NASAL
  Filled 2012-09-18 (×2): qty 22

## 2012-09-18 MED ORDER — ALBUTEROL SULFATE (5 MG/ML) 0.5% IN NEBU
2.5000 mg | INHALATION_SOLUTION | Freq: Four times a day (QID) | RESPIRATORY_TRACT | Status: DC
  Administered 2012-09-18 – 2012-09-20 (×7): 2.5 mg via RESPIRATORY_TRACT
  Filled 2012-09-18 (×6): qty 0.5

## 2012-09-18 MED ORDER — IPRATROPIUM BROMIDE 0.02 % IN SOLN
RESPIRATORY_TRACT | Status: AC
  Administered 2012-09-18: 0.5 mg via RESPIRATORY_TRACT
  Filled 2012-09-18: qty 2.5

## 2012-09-18 MED ORDER — ALBUTEROL SULFATE (5 MG/ML) 0.5% IN NEBU
5.0000 mg | INHALATION_SOLUTION | RESPIRATORY_TRACT | Status: DC | PRN
  Administered 2012-09-18: 2.5 mg via RESPIRATORY_TRACT
  Filled 2012-09-18 (×2): qty 0.5

## 2012-09-18 MED ORDER — MORPHINE SULFATE 4 MG/ML IJ SOLN
INTRAMUSCULAR | Status: AC
  Administered 2012-09-18: 4 mg via INTRAVENOUS
  Filled 2012-09-18: qty 1

## 2012-09-18 MED ORDER — DEXTROSE 5 % IV SOLN
1.0000 g | Freq: Three times a day (TID) | INTRAVENOUS | Status: DC
  Administered 2012-09-18 – 2012-09-19 (×3): 1 g via INTRAVENOUS
  Filled 2012-09-18 (×5): qty 1

## 2012-09-18 MED ORDER — ALBUTEROL SULFATE (5 MG/ML) 0.5% IN NEBU
INHALATION_SOLUTION | RESPIRATORY_TRACT | Status: AC
  Administered 2012-09-18: 5 mg via RESPIRATORY_TRACT
  Filled 2012-09-18: qty 0.5

## 2012-09-18 MED ORDER — ALBUTEROL SULFATE (5 MG/ML) 0.5% IN NEBU
INHALATION_SOLUTION | RESPIRATORY_TRACT | Status: AC
  Administered 2012-09-18: 5 mg via RESPIRATORY_TRACT
  Filled 2012-09-18: qty 1

## 2012-09-18 MED ORDER — MORPHINE SULFATE 4 MG/ML IJ SOLN
4.0000 mg | Freq: Once | INTRAMUSCULAR | Status: AC
  Administered 2012-09-18: 4 mg via INTRAVENOUS

## 2012-09-18 MED ORDER — LEVOTHYROXINE SODIUM 125 MCG PO TABS
125.0000 ug | ORAL_TABLET | Freq: Every day | ORAL | Status: DC
  Administered 2012-09-19 – 2012-09-22 (×4): 125 ug via ORAL
  Filled 2012-09-18 (×5): qty 1

## 2012-09-18 MED ORDER — METHYLPREDNISOLONE SODIUM SUCC 125 MG IJ SOLR
60.0000 mg | Freq: Four times a day (QID) | INTRAMUSCULAR | Status: DC
  Administered 2012-09-18 – 2012-09-19 (×4): 60 mg via INTRAVENOUS
  Filled 2012-09-18 (×8): qty 0.96

## 2012-09-18 MED ORDER — LEVOFLOXACIN IN D5W 750 MG/150ML IV SOLN
750.0000 mg | INTRAVENOUS | Status: DC
  Administered 2012-09-18 – 2012-09-19 (×2): 750 mg via INTRAVENOUS
  Filled 2012-09-18 (×2): qty 150

## 2012-09-18 MED ORDER — ALBUTEROL SULFATE (5 MG/ML) 0.5% IN NEBU
5.0000 mg | INHALATION_SOLUTION | Freq: Once | RESPIRATORY_TRACT | Status: AC
  Administered 2012-09-18: 5 mg via RESPIRATORY_TRACT
  Filled 2012-09-18: qty 1

## 2012-09-18 MED ORDER — ALBUTEROL (5 MG/ML) CONTINUOUS INHALATION SOLN
INHALATION_SOLUTION | RESPIRATORY_TRACT | Status: AC
  Filled 2012-09-18: qty 20

## 2012-09-18 MED ORDER — MORPHINE SULFATE 15 MG PO TABS
15.0000 mg | ORAL_TABLET | ORAL | Status: DC | PRN
  Administered 2012-09-18 – 2012-09-22 (×20): 15 mg via ORAL
  Filled 2012-09-18 (×21): qty 1

## 2012-09-18 MED ORDER — ALBUTEROL SULFATE (5 MG/ML) 0.5% IN NEBU
2.5000 mg | INHALATION_SOLUTION | RESPIRATORY_TRACT | Status: DC | PRN
  Administered 2012-09-18 – 2012-09-19 (×2): 2.5 mg via RESPIRATORY_TRACT
  Filled 2012-09-18 (×3): qty 0.5

## 2012-09-18 MED ORDER — MORPHINE SULFATE ER 30 MG PO TBCR
30.0000 mg | EXTENDED_RELEASE_TABLET | Freq: Two times a day (BID) | ORAL | Status: DC
  Administered 2012-09-18 – 2012-09-22 (×9): 30 mg via ORAL
  Filled 2012-09-18 (×9): qty 1

## 2012-09-18 MED ORDER — ALPRAZOLAM 0.25 MG PO TABS
0.2500 mg | ORAL_TABLET | Freq: Two times a day (BID) | ORAL | Status: DC
  Administered 2012-09-18 – 2012-09-19 (×3): 0.25 mg via ORAL
  Filled 2012-09-18 (×3): qty 1

## 2012-09-18 MED ORDER — HYDROMORPHONE HCL PF 1 MG/ML IJ SOLN: 1.0000 mg | Freq: Once | INTRAMUSCULAR | Status: DC

## 2012-09-18 MED ORDER — IPRATROPIUM BROMIDE 0.02 % IN SOLN
0.5000 mg | Freq: Once | RESPIRATORY_TRACT | Status: AC
  Administered 2012-09-18: 0.5 mg via RESPIRATORY_TRACT
  Filled 2012-09-18: qty 2.5

## 2012-09-18 MED ORDER — IPRATROPIUM BROMIDE 0.02 % IN SOLN
0.5000 mg | RESPIRATORY_TRACT | Status: AC
  Administered 2012-09-18: 0.5 mg via RESPIRATORY_TRACT

## 2012-09-18 MED ORDER — ALBUTEROL SULFATE (5 MG/ML) 0.5% IN NEBU
5.0000 mg | INHALATION_SOLUTION | RESPIRATORY_TRACT | Status: AC
  Administered 2012-09-18: 5 mg via RESPIRATORY_TRACT

## 2012-09-18 MED ORDER — ALBUTEROL (5 MG/ML) CONTINUOUS INHALATION SOLN
10.0000 mg/h | INHALATION_SOLUTION | RESPIRATORY_TRACT | Status: DC
  Administered 2012-09-18: 10 mg/h via RESPIRATORY_TRACT

## 2012-09-18 MED ORDER — SODIUM CHLORIDE 0.9 % IV SOLN
1250.0000 mg | Freq: Two times a day (BID) | INTRAVENOUS | Status: DC
  Administered 2012-09-18 (×2): 1250 mg via INTRAVENOUS
  Filled 2012-09-18 (×3): qty 1250

## 2012-09-18 MED ORDER — TRAZODONE HCL 100 MG PO TABS
100.0000 mg | ORAL_TABLET | Freq: Every day | ORAL | Status: DC
  Administered 2012-09-18 – 2012-09-21 (×4): 100 mg via ORAL
  Filled 2012-09-18 (×5): qty 1

## 2012-09-18 MED ORDER — ENOXAPARIN SODIUM 40 MG/0.4ML ~~LOC~~ SOLN
40.0000 mg | SUBCUTANEOUS | Status: DC
  Administered 2012-09-18 – 2012-09-20 (×3): 40 mg via SUBCUTANEOUS
  Filled 2012-09-18 (×5): qty 0.4

## 2012-09-18 NOTE — ED Notes (Signed)
Attempted to call report x1. RN requests call back in 10 min.

## 2012-09-18 NOTE — ED Notes (Signed)
Pt c/o "spot" on labia x1 wk that she wants looked at. Jacubowitz, EDP made aware.

## 2012-09-18 NOTE — ED Notes (Signed)
[id number]<QI> Expected date:<BR> Expected time:<BR> Means of arrival:<BR> Comments:<BR> COPD nebs on board

## 2012-09-18 NOTE — Progress Notes (Signed)
ANTIBIOTIC CONSULT NOTE - INITIAL  Pharmacy Consult for vancomycin, pharmacy to adjust abx (cefepime/levofloxacin) Indication: rule out pneumonia  Allergies  Allergen Reactions  . Penicillins Rash    Patient Measurements:   Adjusted Body Weight:   Vital Signs: Temp: 100.5 F (38.1 C) (01/30 0812) BP: 128/73 mmHg (01/30 0812) Pulse Rate: 111  (01/30 0812) Intake/Output from previous day:   Intake/Output from this shift:    Labs:  Basename 09/18/12 0944 09/18/12 0930  WBC -- 13.7*  HGB 14.3 12.5  PLT -- 261  LABCREA -- --  CREATININE 1.20* --   The CrCl is unknown because both a height and weight (above a minimum accepted value) are required for this calculation. No results found for this basename: VANCOTROUGH:2,VANCOPEAK:2,VANCORANDOM:2,GENTTROUGH:2,GENTPEAK:2,GENTRANDOM:2,TOBRATROUGH:2,TOBRAPEAK:2,TOBRARND:2,AMIKACINPEAK:2,AMIKACINTROU:2,AMIKACIN:2, in the last 72 hours   Microbiology: No results found for this or any previous visit (from the past 720 hour(s)).  Medical History: Past Medical History  Diagnosis Date  . Asthma   . Ulcerative colitis   . Anxiety   . Depression   . Chronic back pain   . Chronic knee pain   . Thyroid disease     hypo    Assessment: 60 YOF admitted with asthma attack.  CXR shows possible RUL PNA. Orders for broad spectrum abx, ? HCAP vs CAP (does not appear to meet criteria for HCAP). 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

## 2012-09-18 NOTE — Progress Notes (Signed)
Pt confirms Debra [name] as pcp epic updated

## 2012-09-18 NOTE — ED Notes (Signed)
Pt reports asthma attack this am. Normally takes 3-4 breathing tx a day. Was taking one this am and power went out. Was unable to complete treatment. Ems called to complete. Pt received 15mg  albuterol and 1mg  atrovent, 125mg  solumedrol en route. Pt remains wheezy but ems and pt reports much better after treatments. Pt c/o chest pain after coughing. Usually has 2 asthma attacks per week.

## 2012-09-18 NOTE — ED Notes (Signed)
Pt disconnected from continuous neb tx to ambulate to bathroom. Pt insisted on ambulation and refused nonambulatory methods. Pt also had moved self out of bed to chair at bedside without assistance and had removed O2 monitor and spilled part of continuous neb tx. Unknown how much pt received or spilled. Will inform RT and have them reassess at completion of remaining neb.

## 2012-09-18 NOTE — H&P (Signed)
[facility] History and Physical  TANEESHA EDGIN NWG:[id number] DOB: 1971-05-10 DOA: 09/18/2012  Referring physician:   Doug Sou PCP:  Karle Plumber, MD  PCP:  [facility] Ponto  Chief Complaint:  Shortness of breath HPI:  The patient is a 42 y.o. year-old F with severe persistent asthma since childhood, multiple admissions to the ICU with bipap but denies intubation, who states that she was at her baseline health until 2 weeks ago.  Around 4AM this morning, she woke up and felt Kayren Holck of breath so she gave herself a duoneb, which helped for about an hour or hour and twenty minutes.  Normally they last for about 3 hours.  She then felt very Anhthu Perdew of breath and gave herself a second duoneb, however, the electricity went out in the middle of her treatment.  She has been having a dry cough for the last two weeks, increasing.  Cough is productive of green or yellow phlegm occasionally.  She has felt very wheezy and feels SOB even at rest.  For the last few weeks she has been giving herself breathing treatments 3-4 times per day.   She has also had some chest tightness and dull aching pain today.  She has had some fevers today.  She has had her flu shot and pneumonia shot.  She has had rhinorrhea, sinus congestion and some dry scratchy throat.    Baseline: Daytime:  daily Nighttime:  daily Albuterol:  Once daily  Exercise:  1 block DOE Exacerbation:  6 X in last year, last in November at [facility] Pulmonologist:  Dr. Eulis Foster at [facility]  In EMS, she received solumedrol and albuterol 15mg /h and atrovent x 1.  In the ER, she required continuous albuterol at 10mg /h and was placed on 2L [place] with oxygen saturations in the low 90s.  CXR demonstrated possible infiltrate.  She is being admitted for status asthmaticus.    Review of Systems:  Endorses fever in the emergency department, denies chills, changes in hearing and vision.  Mild sore throat.  Substernal chest tightness  since this morning that is relieved somewhat by nebulizer treatments.  Denies nausea, vomiting, diarrhea, constipation, dysuria.  She has a genital sore (swabbed by ER physician).  Denies rash, abnormal bruising or bleeding, focal numbness or weakness, slurred speech, confusion.  Endorses anxiety and stress related to assisting her mother with caring for her father with Parkinsons.    Past Medical History  Diagnosis Date  . Asthma   . Ulcerative colitis   . Anxiety   . Depression   . Chronic back pain   . Chronic knee pain   . Thyroid disease     hypo  . GERD (gastroesophageal reflux disease)   . Insomnia   . Allergic rhinitis   . Arthritis    Past Surgical History  Procedure Date  . Knee surgery     x3, two on left and one on right.  Arthoscopy  . Cholecystectomy   . Abdominal hysterectomy   . Cesarean section   . Tonsillectomy    Social History:  reports that she quit smoking about 5 months ago. Her smoking use included Cigarettes. She smoked .25 packs per day. She has never used smokeless tobacco. She reports that she does not drink alcohol or use illicit drugs. Lives with her mom currently, but she usually rents an apartment with her daughter.  Occasionally uses a cane.    PCP:  D[facility] Ponto  Allergies  Allergen Reactions  . Penicillins  Swelling and Rash    Family History  Problem Relation Age of Onset  . High blood pressure Mother   . Bipolar disorder Mother   . Asthma Father   . Parkinson's disease Father     Prior to Admission medications   Medication Sig Start Date End Date Taking? Authorizing Provider  albuterol (PROVENTIL) (2.5 MG/3ML) 0.083% nebulizer solution Take 2.5 mg by nebulization every 6 (six) hours as needed.     Yes Historical Provider, MD  albuterol-ipratropium (COMBIVENT) 18-103 MCG/ACT inhaler Inhale 2 puffs into the lungs every 6 (six) hours as needed.     Yes Historical Provider, MD  ALPRAZolam (XANAX) 0.25 MG tablet Take 0.25 mg by mouth 2  (two) times daily.    Yes Historical Provider, MD  buPROPion (WELLBUTRIN XL) 300 MG 24 hr tablet Take 300 mg by mouth daily.   Yes Historical Provider, MD  citalopram (CELEXA) 10 MG tablet Take 40 mg by mouth daily.    Yes Historical Provider, MD  ipratropium (ATROVENT) 0.02 % nebulizer solution Take 500 mcg by nebulization 4 (four) times daily.   Yes Historical Provider, MD  levothyroxine (SYNTHROID, LEVOTHROID) 125 MCG tablet Take 125 mcg by mouth daily.     Yes Historical Provider, MD  montelukast (SINGULAIR) 10 MG tablet Take 10 mg by mouth at bedtime.     Yes Historical Provider, MD  morphine (MS CONTIN) 30 MG 12 hr tablet Take 30 mg by mouth 2 (two) times daily.   Yes Historical Provider, MD  morphine (MSIR) 15 MG tablet Take 15 mg by mouth every 4 (four) hours as needed. pain   Yes Historical Provider, MD  omeprazole (PRILOSEC) 40 MG capsule Take 40 mg by mouth daily.   Yes Historical Provider, MD  traZODone (DESYREL) 100 MG tablet Take 100 mg by mouth at bedtime.     Yes Historical Provider, MD   Physical Exam: Filed Vitals:   09/18/12 1600 09/18/12 1654 09/18/12 1700 09/18/12 1800  BP: 112/58  110/65 115/99  Pulse: 78  74 81  Temp: 96.7 F (35.9 C)     TempSrc: Axillary     Resp: 12  13 11   Height:      Weight:      SpO2: 88% 93% 91% 93%     General:  Caucasian female, moderate respiratory distress, sitting up on stretcher, SCM and subcostal retractions and forced expiratory phase 20 minutes after stopping continuous albuterol.    Eyes:  PERRL, anicteric, non-injected.  ENT:  Nares mildly congested.  OP with dry MM and erythema of the posterior oropharynx, no exudates.    Neck:  Supple without TM or JVD.  Lymph:  No cervical, supraclavicular, or submandibular LAD.  Cardiovascular:  RRR without m/r/g.  Respiratory:   Course bilateral rales at the bases and diminished.  Moderate to high pitched full expiratory wheeze throughout, no rhonchi.    Abdomen:  NABS.  Soft, ND,  mild tenderness diffusely to palpation without rebound or guarding.   Skin:  No rashes or focal lesions.  Musculoskeletal:  Normal bulk and tone.  No LE edema.  Psychiatric:  A & O x 4.  Appropriate affect.  Neurologic:  CN 3-12 intact.  5/5 strength.  Sensation intact.  Labs on Admission:  Basic Metabolic Panel:  Lab 09/18/12 4540  NA 138  K 4.3  CL 102  CO2 --  GLUCOSE 123*  BUN 7  CREATININE 1.20*  CALCIUM --  MG --  PHOS --  Liver Function Tests: No results found for this basename: AST:5,ALT:5,ALKPHOS:5,BILITOT:5,PROT:5,ALBUMIN:5 in the last 168 hours No results found for this basename: LIPASE:5,AMYLASE:5 in the last 168 hours No results found for this basename: AMMONIA:5 in the last 168 hours CBC:  Lab 09/18/12 0944 09/18/12 0930  WBC -- 13.7*  NEUTROABS -- --  HGB 14.3 12.5  HCT 42.0 39.9  MCV -- 90.1  PLT -- 261   Cardiac Enzymes:  Lab 09/18/12 1426  CKTOTAL --  CKMB --  CKMBINDEX --  TROPONINI <0.30    BNP (last 3 results) No results found for this basename: PROBNP:3 in the last 8760 hours CBG: No results found for this basename: GLUCAP:5 in the last 168 hours  Radiological Exams on Admission: Dg Chest 2 View  09/18/2012  *RADIOLOGY REPORT*  Clinical Data: Cough.  Wheezing.  Shortness of breath.  Asthma.  CHEST - 2 VIEW  Comparison: 07/07/2011  Findings: Patient is partially rotated to the right however there is also some element right lung volume loss.  Airspace disease is seen in the right upper lobe, suspicious for pneumonia.  Left lung is clear.  No evidence of pleural effusion.  Hilar and mediastinal structures are difficult to evaluate due to patient rotation.  Impression:  Right upper lobe airspace disease and volume loss, suspicious for pneumonia. Post-treatment  radiographic followup recommended to confirm resolution.   Original Report Authenticated By: Myles Rosenthal, M.D.     EKG: Independently reviewed. Sinus  tachycardia  Assessment/Plan Active Problems:  Status asthmaticus  HCAP (healthcare-associated pneumonia)  Acute respiratory failure with hypoxia  Anxiety  Depression  Thyroid disease  GERD (gastroesophageal reflux disease)  Insomnia  Allergic rhinitis  Arthritis  Acute hypoxic respiratory failure:  Likely due to pneumonia and asthma.   -  Wean nasal canula as tolerated, keeping oxygen saturations > 92%  Status asthmaticus, likely triggered by URI and weather change and pneumonia -  Atrovent q6h -  Albuterol 2.5mg  q6h and neb q2h prn -  Solumedrol 60mg  IV q6h -  Continue singulair  HCAP, right upper lobe  -  Flu PCR -  tamiflu -  Vanc, cefepime, levofloxacin per pharmacy -  Legionella, s. Pneumo ag positive (also MRSA swab positive) -  Radiology recommending post-tx CXR to confirm resolution -  F/u blood cultures  Chest pressure:  Likely due to asthma and pneumonia -  Telemetry -  Cycle troponins  Sore throat, appears thrush like and may be related to her high dose adviar:  Magic mouthwash  Anxiety & Depression:  Stable. Continue wellbutrin and xanax  Thyroid disease:   Stable.  Continue synthroid  GERD (gastroesophageal reflux disease):  Stable.  Continue PPI  Insomnia:  Stable,  Continue trazodone  Allergic rhinitis:    Arthritis:  Stable.  Continue MS contin and morphine IR  Diet:  Regular Access:  PIV IVF:  none Proph:  lovenox  Code Status: full code Family Communication: spoke with patient  Disposition Plan:  Admit to stepdown  Time spent: 60 min  Samarrah Tranchina [facility] Pager [phone]  If 7PM-7AM, please contact night-coverage [email or web address] Password [email or web address] 09/18/2012, 7:07 PM

## 2012-09-18 NOTE — ED Provider Notes (Signed)
History     CSN: [id number]  Arrival date & time 09/18/12  0802   First MD Initiated Contact with Patient 09/18/12 [phone]      Chief Complaint  Patient presents with  . Asthma    (Consider location/radiation/quality/duration/timing/severity/associated sxs/prior treatment) HPI Complaint of wheezing 2 asthma onset 5:30 AM today. Treated herself with home nebulizer. EMS treated patient with albuterol 15 mg and Atrovent 1 mg prior to arrival and Solu-Medrol 25 mg IV. Reports some improvement of breathing. [place] chest pain with cough. No other complaint. No other associated symptoms. No fever. Past Medical History  Diagnosis Date  . Asthma   . Ulcerative colitis   . Anxiety   . Depression   . Chronic back pain   . Chronic knee pain   . Thyroid disease     hypo   Chronic pain Past Surgical History  Procedure Date  . Knee surgery   . Cholecystectomy   . Abdominal hysterectomy   . Cesarean section     No family history on file.  History  Substance Use Topics  . Smoking status: Current Every Day Smoker  . Smokeless tobacco: Not on file  . Alcohol Use: No    OB History    Grav Para Term Preterm Abortions TAB SAB Ect Mult Living                  Review of Systems  Constitutional: Negative.   HENT: Negative.   Respiratory: Positive for cough, shortness of breath and wheezing.   Cardiovascular: Negative.   Gastrointestinal: Negative.   Genitourinary: Positive for vaginal pain.       Lesion on right labia for past 1.5 weeks  Musculoskeletal: Negative.   Skin: Negative.   Neurological: Negative.   Hematological: Negative.   Psychiatric/Behavioral: Negative.   All other systems reviewed and are negative.    Allergies  Penicillins  Home Medications   Current Outpatient Rx  Name  Route  Sig  Dispense  Refill  . ALBUTEROL SULFATE (2.5 MG/3ML) 0.083% IN NEBU   Nebulization   Take 2.5 mg by nebulization every 6 (six) hours as needed.           Maximino Greenland 18-103 MCG/ACT IN AERO   Inhalation   Inhale 2 puffs into the lungs every 6 (six) hours as needed.           . ALPRAZOLAM 0.25 MG PO TABS   Oral   Take 0.25 mg by mouth at bedtime as needed.           Brigitte Pulse XL PO   Oral   Take by mouth.           Marland Kitchen CITALOPRAM HYDROBROMIDE 10 MG PO TABS   Oral   Take 10 mg by mouth daily.           Marland Kitchen ESOMEPRAZOLE MAGNESIUM 40 MG PO CPDR   Oral   Take 40 mg by mouth daily before breakfast.           . HYDROCODONE-ACETAMINOPHEN 10-325 MG PO TABS   Oral   Take 1 tablet by mouth every 6 (six) hours as needed.           Marland Kitchen LEVOTHYROXINE SODIUM 125 MCG PO TABS   Oral   Take 125 mcg by mouth daily.           Marland Kitchen MONTELUKAST SODIUM 10 MG PO TABS   Oral   Take 10 mg by mouth at  bedtime.           . MORPHINE SULFATE 30 MG PO TABS   Oral   Take 30 mg by mouth 2 (two) times daily.           Marland Kitchen MOXIFLOXACIN HCL 400 MG PO TABS   Oral   Take 400 mg by mouth daily.           . TRAZODONE HCL 100 MG PO TABS   Oral   Take 100 mg by mouth at bedtime.             There were no vitals taken for this visit.  Physical Exam  Nursing note and vitals reviewed. Constitutional: She appears well-developed and well-nourished. No distress.       Speaks in paragraphs  HENT:  Head: Normocephalic and atraumatic.  Eyes: Conjunctivae normal are normal. Pupils are equal, round, and reactive to light.  Neck: Neck supple. No tracheal deviation present. No thyromegaly present.  Cardiovascular: Regular rhythm.   No murmur heard.      Mildly tachycardic  Pulmonary/Chest: She exhibits tenderness.       Prolonged expiratory phase with expiratory wheeze. Chest wall is tender anteriorly, reproducing pain exactly  Abdominal: Soft. Bowel sounds are normal. She exhibits no distension. There is no tenderness.       obese  Genitourinary:       External exam only, right labia minora with dime sized reddened area , tenderwith  central whitish clearing  Musculoskeletal: Normal range of motion. She exhibits no edema and no tenderness.  Neurological: She is alert. Coordination normal.  Skin: Skin is warm and dry. No rash noted.  Psychiatric: She has a normal mood and affect.    ED Course  Procedures (including critical care time)  Labs Reviewed - No data to display No results found.   No diagnosis found.   Date: 09/18/2012  Rate: 110  Rhythm: sinus tachycardia  QRS Axis: normal  Intervals: normal  ST/T Wave abnormalities: normal  Conduction Disutrbances:none  Narrative Interpretation:   Old EKG Reviewed: Tracing from 07/07/2011 shows sinus bradycardia at 55 beats per minute otherwise no significant change from today's tracing, interpreted by me Results for orders placed during the hospital encounter of 09/18/12  CBC      Component Value Range   WBC 13.7 (*) 4.0 - 10.5 K/uL   RBC 4.43  3.87 - 5.11 MIL/uL   Hemoglobin 12.5  12.0 - 15.0 g/dL   HCT 16.1  09.6 - 04.5 %   MCV 90.1  78.0 - 100.0 fL   MCH 28.2  26.0 - 34.0 pg   MCHC 31.3  30.0 - 36.0 g/dL   RDW 40.9  81.1 - 91.4 %   Platelets 261  150 - 400 K/uL  POCT I-STAT, CHEM 8      Component Value Range   Sodium 138  135 - 145 mEq/L   Potassium 4.3  3.5 - 5.1 mEq/L   Chloride 102  96 - 112 mEq/L   BUN 7  6 - 23 mg/dL   Creatinine, Ser 7.82 (*) 0.50 - 1.10 mg/dL   Glucose, Bld 956 (*) 70 - 99 mg/dL   Calcium, Ion 2.13  0.86 - 1.23 mmol/L   TCO2 27  0 - 100 mmol/L   Hemoglobin 14.3  12.0 - 15.0 g/dL   HCT 57.8  46.9 - 62.9 %   Dg Chest 2 View  09/18/2012  *RADIOLOGY REPORT*  Clinical Data: Cough.  Wheezing.  Shortness of breath.  Asthma.  CHEST - 2 VIEW  Comparison: 07/07/2011  Findings: Patient is partially rotated to the right however there is also some element right lung volume loss.  Airspace disease is seen in the right upper lobe, suspicious for pneumonia.  Left lung is clear.  No evidence of pleural effusion.  Hilar and mediastinal  structures are difficult to evaluate due to patient rotation.  Impression:  Right upper lobe airspace disease and volume loss, suspicious for pneumonia. Post-treatment  radiographic followup recommended to confirm resolution.   Original Report Authenticated By: Myles Rosenthal, M.D.     11:30 PM chest pain improved after treatment with morphine however requesting more pain medicine. Additional morphine ordered. After continuous nebulization in the emergency department after wheeze protocol patient's breathing is not baseline. She continues to wheeze she speaks in paragraphs has prolonged expiratory phase expiratory wheezes. MDM  Up with Dr. Malachi Bonds plan admit step down unit We'll treat for healthcare associated pneumonia in light of fever, cough and chest x-ray findings. Patient was hospitalized December 2013 Diagnosis #1 status asthmaticus #2 healthcare associated pneumonia #3Genital lesion  CRITICAL CARE Performed by: Doug Sou   Total critical care time: 30 minute  Critical care time was exclusive of separately billable procedures and treating other patients.  Critical care was necessary to treat or prevent imminent or life-threatening deterioration.  Critical care was time spent personally by me on the following activities: development of treatment plan with patient and/or surrogate as well as nursing, discussions with consultants, evaluation of patient's response to treatment, examination of patient, obtaining history from patient or surrogate, ordering and performing treatments and interventions, ordering and review of laboratory studies, ordering and review of radiographic studies, pulse oximetry and re-evaluation of patient's condition.      Doug Sou, MD 09/18/12 1110

## 2012-09-18 NOTE — ED Notes (Signed)
Attempted to call report x2. RN on phone receiving report on another pt. Will reattempt in 10 min.

## 2012-09-18 NOTE — ED Notes (Signed)
Patient transported to X-ray 

## 2012-09-19 DIAGNOSIS — D649 Anemia, unspecified: Secondary | ICD-10-CM

## 2012-09-19 DIAGNOSIS — J154 Pneumonia due to other streptococci: Secondary | ICD-10-CM

## 2012-09-19 DIAGNOSIS — D72829 Elevated white blood cell count, unspecified: Secondary | ICD-10-CM

## 2012-09-19 LAB — CBC
MCHC: 31.4 g/dL (ref 30.0–36.0)
MCV: 89.3 fL (ref 78.0–100.0)
Platelets: 243 10*3/uL (ref 150–400)
RDW: 15.1 % (ref 11.5–15.5)
WBC: 17.1 10*3/uL — ABNORMAL HIGH (ref 4.0–10.5)

## 2012-09-19 LAB — BASIC METABOLIC PANEL
CO2: 25 mEq/L (ref 19–32)
Calcium: 9.1 mg/dL (ref 8.4–10.5)
Creatinine, Ser: 0.86 mg/dL (ref 0.50–1.10)
GFR calc Af Amer: >90 mL/min (ref >90)

## 2012-09-19 LAB — LEGIONELLA ANTIGEN, URINE: Legionella Antigen, Urine: NEGATIVE

## 2012-09-19 LAB — TROPONIN I: Troponin I: <0.3 ng/mL (ref <0.30)

## 2012-09-19 MED ORDER — METHYLPREDNISOLONE SODIUM SUCC 125 MG IJ SOLR
60.0000 mg | Freq: Two times a day (BID) | INTRAMUSCULAR | Status: DC
  Administered 2012-09-19 – 2012-09-21 (×4): 60 mg via INTRAVENOUS
  Filled 2012-09-19 (×6): qty 0.96

## 2012-09-19 MED ORDER — ALPRAZOLAM 0.25 MG PO TABS: 0.2500 mg | ORAL_TABLET | Freq: Two times a day (BID) | ORAL | Status: DC

## 2012-09-19 MED ORDER — DEXTROSE 5 % IV SOLN
2.0000 g | INTRAVENOUS | Status: DC
  Administered 2012-09-19 – 2012-09-22 (×4): 2 g via INTRAVENOUS
  Filled 2012-09-19 (×4): qty 2

## 2012-09-19 MED ORDER — ALPRAZOLAM 0.25 MG PO TABS
0.2500 mg | ORAL_TABLET | Freq: Two times a day (BID) | ORAL | Status: DC
  Administered 2012-09-19: 0.25 mg via ORAL
  Filled 2012-09-19: qty 1

## 2012-09-19 MED ORDER — BENZONATATE 100 MG PO CAPS
200.0000 mg | ORAL_CAPSULE | Freq: Three times a day (TID) | ORAL | Status: DC
  Administered 2012-09-19 – 2012-09-22 (×9): 200 mg via ORAL
  Filled 2012-09-19 (×12): qty 2

## 2012-09-19 MED ORDER — ALPRAZOLAM 1 MG PO TABS
1.0000 mg | ORAL_TABLET | Freq: Two times a day (BID) | ORAL | Status: DC
  Administered 2012-09-20 – 2012-09-22 (×6): 1 mg via ORAL
  Filled 2012-09-19 (×6): qty 1

## 2012-09-19 MED ORDER — ALPRAZOLAM 0.5 MG PO TABS
0.7500 mg | ORAL_TABLET | Freq: Once | ORAL | Status: AC
  Administered 2012-09-19: 0.75 mg via ORAL
  Filled 2012-09-19: qty 3

## 2012-09-19 NOTE — Progress Notes (Signed)
Offered support. Patient was wheezing some; talked about needing a breathing treatment. Reported this to RN.  Patient's father is on palliative unit in 1328.  Her mother was recently released from the hospital. Patient talked about feeling overwhelmed with all the medical situations in her family. She requested prayer; prayed. Presence; listening.

## 2012-09-19 NOTE — Progress Notes (Signed)
[facility] PROGRESS NOTE  Debra [name] NWG:[id number] DOB: 1971-04-17 DOA: 09/18/2012 PCP: Karle Plumber, MD  Assessment/Plan  Acute hypoxic respiratory failure: Likely due to pneumonia and asthma.  - Wean nasal canula as tolerated, keeping oxygen saturations > 92%   Status asthmaticus, likely triggered by URI and weather change and pneumonia:  Was ambulating in room a little on nasal canula yesterday - Atrovent q6h  - Albuterol 2.5mg  q6h and neb q2h prn  - Wean Solumedrol 60mg  IV q12h  - Continue singulair   HCAP, right upper lobe  - Flu PCR neg - D/C tamiflu  - Vanc, cefepime, levofloxacin per pharmacy  - Legionella, s. Pneumo ag positive (also MRSA swab positive)  - Radiology recommending post-tx CXR to confirm resolution  - blood cultures pending -  Will touch base with infectious disease regarding tapering of antibiotics based on ag.    Evidence of obstructive sleep apnea with desaturations to the high 70s overnight -  Oxygen QHS during hospitalization -  Will need outpatient sleep study for CPAP titration  Chest pressure: Likely due to asthma and pneumonia  - Telemetry  - troponins neg  Hyperglycemia:  Likely steroid induced -  Start low dose SSI Leukocytosis:  May be due to pneumonia or more likely due to steroids -  Trend Normocytic anemia:  May be dilutional versus secondary to acute illness  -  Trend hgb -  Monitor for signs of bleeding -  Tx for hgb < 7  Sore throat, appears thrush like and may be related to her high dose advair, improved: Magic mouthwash  Anxiety & Depression: Stable. Continue wellbutrin and xanax  Thyroid disease: Stable. Continue synthroid  GERD (gastroesophageal reflux disease): Stable. Continue PPI  Insomnia: Stable, Continue trazodone  Allergic rhinitis:  Arthritis: Stable. Continue MS contin and morphine IR  Diet: Regular  Access: PIV  IVF: none  Proph: lovenox   Code Status: full code  Family Communication: spoke  with patient  Disposition Plan:  Transfer to telemetry   Consultants:  none  Procedures:  none  Antibiotics:  vanc 1/30 >>  Cefepime 1/30 >>  Levofloxacin 1/30 >>   HPI/Subjective:  Patient states that her breathing feels worse than yesterday.  She continues to have substernal chest pressure and wheezing and cough.  Has nausea without vomiting.  Denies diarrhea, constipation, fevers, chills.  Throat feels better this morning  Objective: Filed Vitals:   09/18/12 2100 09/19/12 0000 09/19/12 0400 09/19/12 0547  BP: 96/55 96/57    Pulse: 77 75    Temp:  98.6 F (37 C) 97.9 F (36.6 C)   TempSrc:  Oral Oral   Resp: 13 11    Height:      Weight:      SpO2: 93% 92% 91% 94%    Intake/Output Summary (Last 24 hours) at 09/19/12 0758 Last data filed at 09/19/12 0600  Gross per 24 hour  Intake   1890 ml  Output   2900 ml  Net  -1010 ml   Filed Weights   09/18/12 1211  Weight: 96.8 kg (213 lb 6.5 oz)    Exam:   General:  Obese CF, moderate respiratory distress with SCM and subcostal retractions and forced expiratory phase.    HEENT:  Nonrebreather in place.  MMM  Cardiovascular:  RRR, no mrg, 2+ pulses  Respiratory:  Rales at the bilateral bases L>R, diminished at bases.  Full expiratory wheeze and prolonged expiratory phase  Abdomen:  NABS, soft, ND/NT  MSK:  Trace ankle edema  Neuro:  Grossly intact.  A&Ox4  Data Reviewed: Basic Metabolic Panel:  Lab 09/19/12 8119 09/18/12 0944  NA 135 138  K 4.4 4.3  CL 101 102  CO2 25 --  GLUCOSE 162* 123*  BUN 13 7  CREATININE 0.86 1.20*  CALCIUM 9.1 --  MG -- --  PHOS -- --   Liver Function Tests: No results found for this basename: AST:5,ALT:5,ALKPHOS:5,BILITOT:5,PROT:5,ALBUMIN:5 in the last 168 hours No results found for this basename: LIPASE:5,AMYLASE:5 in the last 168 hours No results found for this basename: AMMONIA:5 in the last 168 hours CBC:  Lab 09/19/12 0206 09/18/12 0944 09/18/12 0930  WBC  17.1* -- 13.7*  NEUTROABS -- -- --  HGB 11.0* 14.3 12.5  HCT 35.0* 42.0 39.9  MCV 89.3 -- 90.1  PLT 243 -- 261   Cardiac Enzymes:  Lab 09/19/12 0206 09/18/12 1933 09/18/12 1426  CKTOTAL -- -- --  CKMB -- -- --  CKMBINDEX -- -- --  TROPONINI <0.30 <0.30 <0.30   BNP (last 3 results) No results found for this basename: PROBNP:3 in the last 8760 hours CBG: No results found for this basename: GLUCAP:5 in the last 168 hours  Recent Results (from the past 240 hour(s))  MRSA PCR SCREENING     Status: Abnormal   Collection Time   09/18/12  2:00 PM      Component Value Range Status Comment   MRSA by PCR POSITIVE (*) NEGATIVE Final   CULTURE, BLOOD (ROUTINE X 2)     Status: Normal (Preliminary result)   Collection Time   09/18/12  2:26 PM      Component Value Range Status Comment   Specimen Description BLOOD RIGHT ARM   Final    Special Requests BOTTLES DRAWN AEROBIC AND ANAEROBIC 4CC   Final    Culture  Setup Time 09/18/2012 20:21   Final    Culture     Final    Value:        BLOOD CULTURE RECEIVED NO GROWTH TO DATE CULTURE WILL BE HELD FOR 5 DAYS BEFORE ISSUING A FINAL NEGATIVE REPORT   Report Status PENDING   Incomplete   CULTURE, BLOOD (ROUTINE X 2)     Status: Normal (Preliminary result)   Collection Time   09/18/12  2:33 PM      Component Value Range Status Comment   Specimen Description BLOOD LEFT HAND   Final    Special Requests BOTTLES DRAWN AEROBIC ONLY 5CC   Final    Culture  Setup Time 09/18/2012 20:21   Final    Culture     Final    Value:        BLOOD CULTURE RECEIVED NO GROWTH TO DATE CULTURE WILL BE HELD FOR 5 DAYS BEFORE ISSUING A FINAL NEGATIVE REPORT   Report Status PENDING   Incomplete      Studies: Dg Chest 2 View  09/18/2012  *RADIOLOGY REPORT*  Clinical Data: Cough.  Wheezing.  Shortness of breath.  Asthma.  CHEST - 2 VIEW  Comparison: 07/07/2011  Findings: Patient is partially rotated to the right however there is also some element right lung volume loss.   Airspace disease is seen in the right upper lobe, suspicious for pneumonia.  Left lung is clear.  No evidence of pleural effusion.  Hilar and mediastinal structures are difficult to evaluate due to patient rotation.  Impression:  Right upper lobe airspace disease and volume loss, suspicious for pneumonia. Post-treatment  radiographic followup recommended to confirm resolution.   Original Report Authenticated By: Myles Rosenthal, M.D.     Scheduled Meds:   . albuterol  2.5 mg Nebulization Q6H  . ALPRAZolam  0.25 mg Oral BID  . benzonatate  200 mg Oral TID  . buPROPion  300 mg Oral Daily  . ceFEPime (MAXIPIME) IV  1 g Intravenous Q8H  . Chlorhexidine Gluconate Cloth  6 each Topical Q0600  . citalopram  40 mg Oral Daily  . enoxaparin (LOVENOX) injection  40 mg Subcutaneous Q24H  . ipratropium  500 mcg Nebulization QID  . levofloxacin (LEVAQUIN) IV  750 mg Intravenous Q24H  . levothyroxine  125 mcg Oral Daily  . magic mouthwash  10 mL Oral TID AC & HS  . methylPREDNISolone (SOLU-MEDROL) injection  60 mg Intravenous Q6H  . montelukast  10 mg Oral QHS  . morphine  30 mg Oral BID  . mupirocin ointment  1 application Nasal BID  . pantoprazole  80 mg Oral Daily  . traZODone  100 mg Oral QHS  . vancomycin  1,250 mg Intravenous Q12H   Continuous Infusions:   Active Problems:  Status asthmaticus  HCAP (healthcare-associated pneumonia)  Acute respiratory failure with hypoxia  Anxiety  Depression  Thyroid disease  GERD (gastroesophageal reflux disease)  Insomnia  Allergic rhinitis  Arthritis    Time spent: 30 min    Najae Filsaime, [facility]  [facility] Pager [phone]. If 8PM-8AM, please contact night-coverage at [email or web address], password [facility] 09/19/2012, 7:58 AM  LOS: 1 day

## 2012-09-19 NOTE — Progress Notes (Signed)
   CARE MANAGEMENT NOTE 09/19/2012  Patient:  Debra [name],Debra [name]   Account Number:  [id number]  Date Initiated:  09/19/2012  Documentation initiated by:  Jiles Crocker  Subjective/Objective Assessment:   ADMITTED WITH PNEUMONIA     Action/Plan:   PCP:  Karle Plumber, MD  LIVES WITH HER DAUGHTER   Anticipated DC Date:  09/26/2012   Anticipated DC Plan:  HOME/SELF CARE      DC Planning Services  CM consult         Status of service:  In process, will continue to follow Medicare Important Message given?  NA - LOS <3 / Initial given by admissions (If response is "NO", the following Medicare IM given date fields will be blank)  Per UR Regulation:  Reviewed for med. necessity/level of care/duration of stay  Comments:  09/19/2012- B CHANDLERR RN,BSN,MHA

## 2012-09-19 NOTE — Progress Notes (Signed)
IV removed do to infiltration and 2 restarts unsuccessful. IV team notified and will be coming to replace IV. Erskin Burnet RN

## 2012-09-20 DIAGNOSIS — J189 Pneumonia, unspecified organism: Secondary | ICD-10-CM

## 2012-09-20 LAB — CBC
Hemoglobin: 10.3 g/dL — ABNORMAL LOW (ref 12.0–15.0)
MCHC: 31.4 g/dL (ref 30.0–36.0)
Platelets: 226 10*3/uL (ref 150–400)

## 2012-09-20 LAB — BASIC METABOLIC PANEL
GFR calc Af Amer: >90 mL/min (ref >90)
GFR calc non Af Amer: 85 mL/min — ABNORMAL LOW (ref >90)
Glucose, Bld: 160 mg/dL — ABNORMAL HIGH (ref 70–99)
Potassium: 4.2 mEq/L (ref 3.5–5.1)
Sodium: 141 mEq/L (ref 135–145)

## 2012-09-20 LAB — GLUCOSE, CAPILLARY

## 2012-09-20 MED ORDER — DOCUSATE SODIUM 100 MG PO CAPS
100.0000 mg | ORAL_CAPSULE | Freq: Two times a day (BID) | ORAL | Status: DC
  Administered 2012-09-20 – 2012-09-22 (×5): 100 mg via ORAL
  Filled 2012-09-20 (×6): qty 1

## 2012-09-20 MED ORDER — FLUTICASONE PROPIONATE 50 MCG/ACT NA SUSP
2.0000 | Freq: Every day | NASAL | Status: DC
  Administered 2012-09-20 – 2012-09-22 (×3): 2 via NASAL
  Filled 2012-09-20: qty 16

## 2012-09-20 MED ORDER — INSULIN ASPART 100 UNIT/ML ~~LOC~~ SOLN
0.0000 [IU] | Freq: Three times a day (TID) | SUBCUTANEOUS | Status: DC
  Administered 2012-09-20: 3 [IU] via SUBCUTANEOUS
  Administered 2012-09-21: 1 [IU] via SUBCUTANEOUS

## 2012-09-20 MED ORDER — INSULIN ASPART 100 UNIT/ML ~~LOC~~ SOLN: 0.0000 [IU] | Freq: Every day | SUBCUTANEOUS | Status: DC

## 2012-09-20 MED ORDER — ALUM & MAG HYDROXIDE-SIMETH 200-200-20 MG/5ML PO SUSP
15.0000 mL | ORAL | Status: DC | PRN
  Administered 2012-09-20: 15 mL via ORAL
  Filled 2012-09-20: qty 30

## 2012-09-20 MED ORDER — IPRATROPIUM BROMIDE 0.02 % IN SOLN
500.0000 ug | RESPIRATORY_TRACT | Status: DC
  Administered 2012-09-20 – 2012-09-22 (×13): 500 ug via RESPIRATORY_TRACT
  Filled 2012-09-20 (×13): qty 2.5

## 2012-09-20 MED ORDER — ALBUTEROL SULFATE (5 MG/ML) 0.5% IN NEBU
2.5000 mg | INHALATION_SOLUTION | RESPIRATORY_TRACT | Status: DC
  Administered 2012-09-20 – 2012-09-22 (×13): 2.5 mg via RESPIRATORY_TRACT
  Filled 2012-09-20 (×13): qty 0.5

## 2012-09-20 MED ORDER — BISACODYL 5 MG PO TBEC
10.0000 mg | DELAYED_RELEASE_TABLET | Freq: Every day | ORAL | Status: DC | PRN
  Administered 2012-09-20: 10 mg via ORAL
  Filled 2012-09-20: qty 2

## 2012-09-20 NOTE — Progress Notes (Addendum)
[facility] PROGRESS NOTE  Debra [name] WNU:[id number] DOB: 02/23/1971 DOA: 09/18/2012 PCP: Karle Plumber, MD  Assessment/Plan  Acute hypoxic respiratory failure: Likely due to pneumonia and asthma.  - Wean nasal canula as tolerated, keeping oxygen saturations > 92%   Status asthmaticus, likely triggered by URI and weather change and pneumonia:  Was ambulating in room a little on nasal canula yesterday.  Patient states that she uses her atrovent q4h at home and would like more frequent treatments.   - Atrovent q4h  - Albuterol 2.5mg  q4h and neb q2h prn  - Wean Solumedrol 60mg  IV q12h again today and wean to prednisone tomorrow - Continue singulair  - Add BID PPI  HCAP, Strep pneumo pneumonia (Ag positive).  Tapered to ceftriaxone yesterday.   - Flu PCR neg - Ceftriaxone 2gm once daily - Legionella neg, s. Pneumo ag positive (also MRSA swab positive)  - Radiology recommending post-tx CXR to confirm resolution  - blood cultures NGTD  Evidence of obstructive sleep apnea with desaturations to the high 70s overnight -  Oxygen QHS during hospitalization -  Will need outpatient sleep study for CPAP titration  Chest pressure: Likely due to asthma and pneumonia  - Telemetry  - troponins neg  Hyperglycemia:  Likely steroid induced -  Start low dose SSI Leukocytosis:  May be due to pneumonia or more likely due to steroids, trending down -  Trend Normocytic anemia:  May be dilutional versus secondary to acute illness  -  Trend hgb -  Monitor for signs of bleeding -  Tx for hgb < 7  Sore throat, appears thrush like and may be related to her high dose advair, improved: Magic mouthwash  Anxiety & Depression: Stable. Continue wellbutrin and xanax  Thyroid disease: Stable. Continue synthroid  GERD (gastroesophageal reflux disease): Stable. Continue PPI  Insomnia: Stable, Continue trazodone  Allergic rhinitis: stable.  Add flonase to singulair Arthritis: Stable. Continue MS  contin and morphine IR  Diet: Regular  Access: PIV  IVF: none  Proph: lovenox   Code Status: full code  Family Communication: spoke with patient  Disposition Plan:   Pending weaned from oxygen.  Will need close follow up with pulmonology     Consultants:  none  Procedures:  none  Antibiotics:  vanc 1/30 >> 1/31  Cefepime 1/30 >> 1/31  Levofloxacin 1/30 >> 1/31  Ceftriaxone 1/31 >>  HPI/Subjective:  Patient states that her breathing feels better than yesterday.  Denies substernal chest pressure.  Has wheezing and cough.  Denies nausea without vomiting and tolerating diet.  Denies diarrhea, constipation, fevers, chills.  Throat feels better this morning  Objective: Filed Vitals:   09/19/12 2329 09/20/12 0602 09/20/12 0834 09/20/12 0841  BP: 114/57 111/66    Pulse: 77 81    Temp: 98.1 F (36.7 C) 98.5 F (36.9 C)    TempSrc: Oral Oral    Resp: 20 20    Height:      Weight:      SpO2: 89% 92% 94% 94%    Intake/Output Summary (Last 24 hours) at 09/20/12 1026 Last data filed at 09/20/12 0834  Gross per 24 hour  Intake    770 ml  Output      0 ml  Net    770 ml   Filed Weights   09/18/12 1211 09/19/12 0955  Weight: 96.8 kg (213 lb 6.5 oz) 99.247 kg (218 lb 12.8 oz)    Exam:   General:  Obese CF, mild respiratory  distress with subtle SCM retractions when speaking and forced expiratory phase.  Nasal canula in place  HEENT:  MMM  Cardiovascular:  RRR, no mrg, 2+ pulses  Respiratory:  Rales at the bilateral bases, diminished at bases.  Full high pitched expiratory wheeze and prolonged expiratory phase  Abdomen:  NABS, soft, ND/NT  MSK:  Trace ankle edema  Neuro:  Grossly intact.  A&Ox4  Data Reviewed: Basic Metabolic Panel:  Lab 09/20/12 4098 09/19/12 0206 09/18/12 0944  NA 141 135 138  K 4.2 4.4 4.3  CL 107 101 102  CO2 27 25 --  GLUCOSE 160* 162* 123*  BUN 16 13 7   CREATININE 0.84 0.86 1.20*  CALCIUM 9.0 9.1 --  MG -- -- --  PHOS -- --  --   Liver Function Tests: No results found for this basename: AST:5,ALT:5,ALKPHOS:5,BILITOT:5,PROT:5,ALBUMIN:5 in the last 168 hours No results found for this basename: LIPASE:5,AMYLASE:5 in the last 168 hours No results found for this basename: AMMONIA:5 in the last 168 hours CBC:  Lab 09/20/12 0515 09/19/12 0206 09/18/12 0944 09/18/12 0930  WBC 15.1* 17.1* -- 13.7*  NEUTROABS -- -- -- --  HGB 10.3* 11.0* 14.3 12.5  HCT 32.8* 35.0* 42.0 39.9  MCV 90.9 89.3 -- 90.1  PLT 226 243 -- 261   Cardiac Enzymes:  Lab 09/19/12 0206 09/18/12 1933 09/18/12 1426  CKTOTAL -- -- --  CKMB -- -- --  CKMBINDEX -- -- --  TROPONINI <0.30 <0.30 <0.30   BNP (last 3 results) No results found for this basename: PROBNP:3 in the last 8760 hours CBG: No results found for this basename: GLUCAP:5 in the last 168 hours  Recent Results (from the past 240 hour(s))  MRSA PCR SCREENING     Status: Abnormal   Collection Time   09/18/12  2:00 PM      Component Value Range Status Comment   MRSA by PCR POSITIVE (*) NEGATIVE Final   CULTURE, BLOOD (ROUTINE X 2)     Status: Normal (Preliminary result)   Collection Time   09/18/12  2:26 PM      Component Value Range Status Comment   Specimen Description BLOOD RIGHT ARM   Final    Special Requests BOTTLES DRAWN AEROBIC AND ANAEROBIC 4CC   Final    Culture  Setup Time 09/18/2012 20:21   Final    Culture     Final    Value:        BLOOD CULTURE RECEIVED NO GROWTH TO DATE CULTURE WILL BE HELD FOR 5 DAYS BEFORE ISSUING A FINAL NEGATIVE REPORT   Report Status PENDING   Incomplete   CULTURE, BLOOD (ROUTINE X 2)     Status: Normal (Preliminary result)   Collection Time   09/18/12  2:33 PM      Component Value Range Status Comment   Specimen Description BLOOD LEFT HAND   Final    Special Requests BOTTLES DRAWN AEROBIC ONLY 5CC   Final    Culture  Setup Time 09/18/2012 20:21   Final    Culture     Final    Value:        BLOOD CULTURE RECEIVED NO GROWTH TO DATE  CULTURE WILL BE HELD FOR 5 DAYS BEFORE ISSUING A FINAL NEGATIVE REPORT   Report Status PENDING   Incomplete      Studies: No results found.  Scheduled Meds:    . albuterol  2.5 mg Nebulization Q6H  . ALPRAZolam  1 mg Oral BID  .  benzonatate  200 mg Oral TID  . buPROPion  300 mg Oral Daily  . cefTRIAXone (ROCEPHIN)  IV  2 g Intravenous Q24H  . Chlorhexidine Gluconate Cloth  6 each Topical Q0600  . citalopram  40 mg Oral Daily  . enoxaparin (LOVENOX) injection  40 mg Subcutaneous Q24H  . ipratropium  500 mcg Nebulization QID  . levothyroxine  125 mcg Oral Daily  . magic mouthwash  10 mL Oral TID AC & HS  . methylPREDNISolone (SOLU-MEDROL) injection  60 mg Intravenous Q12H  . montelukast  10 mg Oral QHS  . morphine  30 mg Oral BID  . mupirocin ointment  1 application Nasal BID  . pantoprazole  80 mg Oral Daily  . traZODone  100 mg Oral QHS   Continuous Infusions:   Principal Problem:  *Status asthmaticus Active Problems:  HCAP (healthcare-associated pneumonia)  Acute respiratory failure with hypoxia  Anxiety  Depression  Thyroid disease  GERD (gastroesophageal reflux disease)  Insomnia  Allergic rhinitis  Arthritis  Leukocytosis  Normocytic anemia  Streptococcal pneumonia    Time spent: 30 min    Joliet Mallozzi, [facility]  [facility] Pager [phone]. If 8PM-8AM, please contact night-coverage at [email or web address], password [facility] 09/20/2012, 10:26 AM  LOS: 2 days

## 2012-09-20 NOTE — Progress Notes (Signed)
Patients immediate family member, who wishes to remain anonymous to patient, is concerned patient is abusing pain medication.  Family member states other family members are also concerned about the change in her behavior, enjoying the way the pain medications make her feel.  Family member wants to see patient get help because she believes patient is addicted to pain medication.

## 2012-09-20 NOTE — Plan of Care (Signed)
Problem: Phase II Progression Outcomes Goal: Wean O2 if indicated Outcome: Progressing 98% on 1.5L at rest

## 2012-09-21 DIAGNOSIS — K219 Gastro-esophageal reflux disease without esophagitis: Secondary | ICD-10-CM

## 2012-09-21 DIAGNOSIS — F329 Major depressive disorder, single episode, unspecified: Secondary | ICD-10-CM

## 2012-09-21 LAB — GLUCOSE, CAPILLARY
Glucose-Capillary: 153 mg/dL — ABNORMAL HIGH (ref 70–99)
Glucose-Capillary: 96 mg/dL (ref 70–99)

## 2012-09-21 MED ORDER — PREDNISONE 50 MG PO TABS
60.0000 mg | ORAL_TABLET | Freq: Every day | ORAL | Status: DC
  Administered 2012-09-22: 60 mg via ORAL
  Filled 2012-09-21 (×2): qty 1

## 2012-09-21 MED ORDER — FAMOTIDINE 20 MG PO TABS
20.0000 mg | ORAL_TABLET | Freq: Two times a day (BID) | ORAL | Status: DC
  Administered 2012-09-21 – 2012-09-22 (×2): 20 mg via ORAL
  Filled 2012-09-21 (×4): qty 1

## 2012-09-21 NOTE — Progress Notes (Addendum)
[facility] PROGRESS NOTE  Debra [name] ZOX:[id number] DOB: [birth year] DOA: 09/18/2012 PCP: Karle Plumber, MD  Assessment/Plan  Acute hypoxic respiratory failure: Likely due to pneumonia and asthma.  - Wean nasal canula as tolerated, keeping oxygen saturations > 92%  -  Walking pulse oximetry today  Status asthmaticus, likely triggered by URI and weather change and pneumonia:  Was ambulating in room a little on nasal canula yesterday.  Patient states that she uses her atrovent q4h at home and would like more frequent treatments.   - Atrovent q4h  - Albuterol 2.5mg  q4h and neb q2h prn  - Wean to prednisone tomorrow - Continue singulair  -  D/c PPI due to drug-drug interaction with celexa  HCAP, Strep pneumo pneumonia (Ag positive).  Tapered to ceftriaxone.   - Flu PCR neg - Ceftriaxone 2gm once daily day 4 of antibiotics - Legionella neg, s. Pneumo ag positive (also MRSA swab positive)  - Radiology recommending post-tx CXR to confirm resolution  - blood cultures NGTD  Evidence of obstructive sleep apnea with desaturations to the high 70s overnight -  Oxygen QHS during hospitalization -  Will need outpatient sleep study for CPAP titration  Chest pressure: Likely due to asthma and pneumonia.  No events on telemetry - d/c Telemetry  - troponins neg  Hyperglycemia:  Likely steroid induced, improved -  Continue low dose SSI  Leukocytosis and anemia:  Repeat as outpatient to ensure resolution  Sore throat, appears thrush like and may be related to her high dose advair, improved: Magic mouthwash  Anxiety & Depression: Stable. Continue wellbutrin and xanax  Thyroid disease: Stable. Continue synthroid  GERD (gastroesophageal reflux disease): Stable. D/c PPI due to drug-drug interaction and start famotidine 20mg  bid Insomnia: Stable, Continue trazodone  Allergic rhinitis: stable.  Add flonase to singulair Arthritis: Stable. Continue MS contin and morphine IR.  Will need  outpatient pain management consultation.    Diet: Regular  Access: PIV  IVF: none  Proph: lovenox   Code Status: full code  Family Communication: spoke with patient  Disposition Plan:   Pending weaned from oxygen.  Will need close follow up with pulmonology.    Consultants:  none  Procedures:  none  Antibiotics:  vanc 1/30 >> 1/31  Cefepime 1/30 >> 1/31  Levofloxacin 1/30 >> 1/31  Ceftriaxone 1/31 >>  HPI/Subjective:  Patient states that her breathing feels better than yesterday, but she required reapplication of oxygen due to desaturations and severe dyspnea after going to the bathroom without oxygen on.  Denies substernal chest pressure.  Has wheezing and cough and had coughing spell overnight.  Denies nausea without vomiting and tolerating diet.  Denies diarrhea, constipation, fevers, chills.      Objective: Filed Vitals:   09/21/12 0302 09/21/12 0604 09/21/12 0735 09/21/12 1151  BP:  124/65    Pulse:  73    Temp:  97.7 F (36.5 C)    TempSrc:  Oral    Resp:  18    Height:      Weight:      SpO2: 94% 94% 97% 95%    Intake/Output Summary (Last 24 hours) at 09/21/12 1229 Last data filed at 09/21/12 0500  Gross per 24 hour  Intake   2020 ml  Output      0 ml  Net   2020 ml   Filed Weights   09/18/12 1211 09/19/12 0955  Weight: 96.8 kg (213 lb 6.5 oz) 99.247 kg (218 lb 12.8 oz)  Exam:   General:  Obese CF, no respiratory disress, nasal canula in place  HEENT:  MMM  Cardiovascular:  RRR, no mrg, 2+ pulses  Respiratory:   Full moderate pitched expiratory wheeze and prolonged expiratory phase, no focal rales or rhonchi  Abdomen:  NABS, soft, ND/NT  MSK:  Trace ankle edema  Neuro:  Grossly intact.  A&Ox4  Data Reviewed: Basic Metabolic Panel:  Lab 09/20/12 4098 09/19/12 0206 09/18/12 0944  NA 141 135 138  K 4.2 4.4 4.3  CL 107 101 102  CO2 27 25 --  GLUCOSE 160* 162* 123*  BUN 16 13 7   CREATININE 0.84 0.86 1.20*  CALCIUM 9.0 9.1 --   MG -- -- --  PHOS -- -- --   Liver Function Tests: No results found for this basename: AST:5,ALT:5,ALKPHOS:5,BILITOT:5,PROT:5,ALBUMIN:5 in the last 168 hours No results found for this basename: LIPASE:5,AMYLASE:5 in the last 168 hours No results found for this basename: AMMONIA:5 in the last 168 hours CBC:  Lab 09/20/12 0515 09/19/12 0206 09/18/12 0944 09/18/12 0930  WBC 15.1* 17.1* -- 13.7*  NEUTROABS -- -- -- --  HGB 10.3* 11.0* 14.3 12.5  HCT 32.8* 35.0* 42.0 39.9  MCV 90.9 89.3 -- 90.1  PLT 226 243 -- 261   Cardiac Enzymes:  Lab 09/19/12 0206 09/18/12 1933 09/18/12 1426  CKTOTAL -- -- --  CKMB -- -- --  CKMBINDEX -- -- --  TROPONINI <0.30 <0.30 <0.30   BNP (last 3 results) No results found for this basename: PROBNP:3 in the last 8760 hours CBG:  Lab 09/21/12 1132 09/21/12 0757 09/20/12 2118 09/20/12 1720 09/20/12 1156  GLUCAP 128* 96 153* 154* 108*    Recent Results (from the past 240 hour(s))  MRSA PCR SCREENING     Status: Abnormal   Collection Time   09/18/12  2:00 PM      Component Value Range Status Comment   MRSA by PCR POSITIVE (*) NEGATIVE Final   CULTURE, BLOOD (ROUTINE X 2)     Status: Normal (Preliminary result)   Collection Time   09/18/12  2:26 PM      Component Value Range Status Comment   Specimen Description BLOOD RIGHT ARM   Final    Special Requests BOTTLES DRAWN AEROBIC AND ANAEROBIC 4CC   Final    Culture  Setup Time 09/18/2012 20:21   Final    Culture     Final    Value:        BLOOD CULTURE RECEIVED NO GROWTH TO DATE CULTURE WILL BE HELD FOR 5 DAYS BEFORE ISSUING A FINAL NEGATIVE REPORT   Report Status PENDING   Incomplete   CULTURE, BLOOD (ROUTINE X 2)     Status: Normal (Preliminary result)   Collection Time   09/18/12  2:33 PM      Component Value Range Status Comment   Specimen Description BLOOD LEFT HAND   Final    Special Requests BOTTLES DRAWN AEROBIC ONLY 5CC   Final    Culture  Setup Time 09/18/2012 20:21   Final    Culture      Final    Value:        BLOOD CULTURE RECEIVED NO GROWTH TO DATE CULTURE WILL BE HELD FOR 5 DAYS BEFORE ISSUING A FINAL NEGATIVE REPORT   Report Status PENDING   Incomplete      Studies: No results found.  Scheduled Meds:    . albuterol  2.5 mg Nebulization Q4H  . ALPRAZolam  1  mg Oral BID  . benzonatate  200 mg Oral TID  . buPROPion  300 mg Oral Daily  . cefTRIAXone (ROCEPHIN)  IV  2 g Intravenous Q24H  . Chlorhexidine Gluconate Cloth  6 each Topical Q0600  . citalopram  40 mg Oral Daily  . docusate sodium  100 mg Oral BID  . enoxaparin (LOVENOX) injection  40 mg Subcutaneous Q24H  . fluticasone  2 spray Each Nare Daily  . insulin aspart  0-5 Units Subcutaneous QHS  . insulin aspart  0-9 Units Subcutaneous TID WC  . ipratropium  500 mcg Nebulization Q4H  . levothyroxine  125 mcg Oral Daily  . magic mouthwash  10 mL Oral TID AC & HS  . methylPREDNISolone (SOLU-MEDROL) injection  60 mg Intravenous Q12H  . montelukast  10 mg Oral QHS  . morphine  30 mg Oral BID  . mupirocin ointment  1 application Nasal BID  . pantoprazole  80 mg Oral Daily  . traZODone  100 mg Oral QHS   Continuous Infusions:   Principal Problem:  *Status asthmaticus Active Problems:  HCAP (healthcare-associated pneumonia)  Acute respiratory failure with hypoxia  Anxiety  Depression  Thyroid disease  GERD (gastroesophageal reflux disease)  Insomnia  Allergic rhinitis  Arthritis  Leukocytosis  Normocytic anemia  Streptococcal pneumonia    Time spent: 30 min    Lugene Hitt, [facility]  [facility] Pager [phone]. If 7PM-7AM, please contact night-coverage at [email or web address], password [facility] 09/21/2012, 12:29 PM  LOS: 3 days

## 2012-09-21 NOTE — Discharge Summary (Signed)
Physician Discharge Summary  Debra [name] XBJ:[id number] DOB: 05-15-1971 DOA: 09/18/2012  PCP: Karle Plumber, MD  Admit date: 09/18/2012 Discharge date: 09/22/2012  Recommendations for Outpatient Follow-up:  1. Follow up within 1 week of discharge with primary care doctor for respiratory exam.  Repeat CBC to document improvement in leukocytosis and anemia 2. Follow up with pulmonology for PFTs and sleep study as outpatient when feeling better.   3. PCP to repeat CXR in 4-6 weeks to document that opacity has cleared and follow up the final results of the blood cultures and vulvar lesion HSV test.   4. F/u at Pain management clinic for ongoing care 5. Radiology recommending outpatient repeat CXR to document resolution of pneumonia.    Discharge Diagnoses:  Principal Problem:  *Status asthmaticus Active Problems:  HCAP (healthcare-associated pneumonia)  Acute respiratory failure with hypoxia  Anxiety  Depression  Thyroid disease  GERD (gastroesophageal reflux disease)  Insomnia  Allergic rhinitis  Arthritis  Leukocytosis  Normocytic anemia  Streptococcal pneumonia   Discharge Condition: stable, improved  Diet recommendation: regular  Wt Readings from Last 3 Encounters:  09/19/12 99.247 kg (218 lb 12.8 oz)  07/07/11 101.152 kg (223 lb)    History of present illness:   The patient is a 42 y.o. year-old F with severe persistent asthma since childhood, multiple admissions to the ICU with bipap but denies intubation, who states that she was at her baseline health until 2 weeks ago. Around 4AM this morning, she woke up and felt Joelys Staubs of breath so she gave herself a duoneb, which helped for about an hour or hour and twenty minutes. Normally they last for about 3 hours. She then felt very Cire Deyarmin of breath and gave herself a second duoneb, however, the electricity went out in the middle of her treatment. She has been having a dry cough for the last two weeks, increasing. Cough is  productive of green or yellow phlegm occasionally. She has felt very wheezy and feels SOB even at rest. For the last few weeks she has been giving herself breathing treatments 3-4 times per day. She has also had some chest tightness and dull aching pain today. She has had some fevers today. She has had her flu shot and pneumonia shot. She has had rhinorrhea, sinus congestion and some dry scratchy throat.   Hospital Course:   Acute hypoxic respiratory failure: Likely due to pneumonia and asthma.  She was admitted to the stepdown unit where she required oxygen up to nonrebreather overnight during her first night.  She appeared to have sleep apnea with desaturations to the high 70s intermittently.  During the day, however, she was able to maintain her oxygen saturations on room air by the time of discharge.  She should have a sleep study done to evaluate for sleep apnea.    Status asthmaticus, likely triggered by URI, weather change and pneumonia:  She initially required frequent albuterol every 2-4 hours, but was able to space to every 4 hours.  She was given IV solumedrol and should complete a long taper of steroids.  She should continue breathing treatments every 4-6 hours until she follows up with her primary care doctor.  She should continue singulair and her ICS/LABA.    HCAP, Strep pneumo pneumonia (Ag positive). Transitioned from vancomycin, cefepime, and levofloxacin to ceftriaxone when the urine antigen was positive.  Her blood cultures were negative.   - Flu PCR neg  - She completed 5 days of antibiotics and remained afebrile.   -  Legionella neg, s. Pneumo ag positive (also MRSA swab positive)  - Radiology recommending post-tx CXR to confirm resolution  - blood cultures NGTD   Evidence of obstructive sleep apnea with desaturations to the high 70s overnight.   - Will need outpatient sleep study for CPAP titration   Chest pressure: Likely due to asthma and pneumonia. No events on telemetry  and troponins neg.  Improved with improvement in breathing  Hyperglycemia: Likely steroid induced, improved.  She does not need insulin as an outpateint and should follow up with her PCP for routine diabetes screening. - Healthy heart, low carbohydrate diet, particularly when on steroids.    Vulvar ulcer:  Swabbed for HSV, results pending at time of discharge. -  PCP to follow up final results -  HIV nonreactive.    Leukocytosis and anemia: Repeat as outpatient to ensure resolution  Sore throat, appeared thrush like and may be related to her high dose advair, improved: Magic mouthwash  Anxiety & Depression: Stable. Continue wellbutrin and xanax  Thyroid disease: Stable. Continue synthroid  GERD (gastroesophageal reflux disease): Stable. D/c'd PPI due to drug-drug interaction with celexa and started famotidine 20mg  bid inpatient.  Given Rx for ranitidine outpatient.   Insomnia: Stable, Continued trazodone  Allergic rhinitis: stable. Added flonase to singulair  Arthritis: Stable. Continue MS contin and morphine IR. Follow up with pain clinic for ongoing care.     Consultants:  none Procedures:  none Antibiotics:  vanc 1/30 >> 1/31  Cefepime 1/30 >> 1/31  Levofloxacin 1/30 >> 1/31  Ceftriaxone 1/31 >> 2/3   Discharge Exam: Filed Vitals:   09/22/12 0617  BP: 129/87  Pulse: 68  Temp: 98.5 F (36.9 C)  Resp: 18   Filed Vitals:   09/21/12 2357 09/22/12 0411 09/22/12 0617 09/22/12 0817  BP:   129/87   Pulse:   68   Temp:   98.5 F (36.9 C)   TempSrc:   Oral   Resp:   18   Height:      Weight:      SpO2: 93% 92% 96% 95%   Patient states that she feels well and was able to ambulate downstairs to visit her dad without SOB until she returned, at which time she was due for a breathing treatment.    General: Obese CF, no respiratory disress on room air HEENT: MMM  Cardiovascular: RRR, no mrg, 2+ pulses  Respiratory: Full moderate pitched expiratory wheeze and prolonged  expiratory phase, no focal rales or rhonchi  Abdomen: NABS, soft, ND/NT  MSK: Trace ankle edema  Neuro: Grossly intact. A&Ox4  Discharge Instructions      Discharge Orders    Future Orders Please Complete By Expires   Diet - low sodium heart healthy      Increase activity slowly      Discharge instructions      Comments:   You were hospitalized with Strep pneumoniae pneumonia and asthma exacerbation.  You were treated with IV antibiotics for 5 days and have completed a course.  You were also given IV steroids and breathing treatments every 2-4 hours and your wheezing improved.  At the time of discharge, you do not require oxygen, even with exertion.  Please take a long taper of steroids.  Take 6 tabs by mouth daily for 2 days, 5 tabs by mouth daily for 2 days, 4 tabs by mouth for 2 days, 3 tabs by mouth for 2 days, 2 tabs by mouth for 2 days, 1 tab  by mouth for 2 days, then stop.  Continue your breathing treatments every 4-6 hours scheduled until you follow up with your primary care doctor within one week.  Stop your omeprazole due to a drug-drug interaction and start ranitidine instead.  C   Call MD for:  temperature >100.4      Call MD for:  persistant nausea and vomiting      Call MD for:  severe uncontrolled pain      Call MD for:  difficulty breathing, headache or visual disturbances      Call MD for:  hives      Call MD for:  persistant dizziness or light-headedness      Call MD for:  extreme fatigue          Medication List     As of 09/22/2012 12:56 PM    STOP taking these medications         omeprazole 40 MG capsule   Commonly known as: PRILOSEC      TAKE these medications         albuterol (2.5 MG/3ML) 0.083% nebulizer solution   Commonly known as: PROVENTIL   Take 3 mLs (2.5 mg total) by nebulization every 4 (four) hours as needed for wheezing or shortness of breath.      albuterol-ipratropium 18-103 MCG/ACT inhaler   Commonly known as: COMBIVENT   Inhale 2 puffs  into the lungs every 6 (six) hours as needed.      ALPRAZolam 0.25 MG tablet   Commonly known as: XANAX   Take 4 tablets (1 mg total) by mouth 2 (two) times daily as needed for anxiety.      benzonatate 200 MG capsule   Commonly known as: TESSALON   Take 1 capsule (200 mg total) by mouth 3 (three) times daily.      bisacodyl 5 MG EC tablet   Commonly known as: DULCOLAX   Take 2 tablets (10 mg total) by mouth daily as needed.      buPROPion 300 MG 24 hr tablet   Commonly known as: WELLBUTRIN XL   Take 300 mg by mouth daily.      citalopram 10 MG tablet   Commonly known as: CELEXA   Take 40 mg by mouth daily.      DSS 100 MG Caps   Take 100 mg by mouth 2 (two) times daily.      fluticasone 50 MCG/ACT nasal spray   Commonly known as: FLONASE   Place 2 sprays into the nose daily.      Fluticasone-Salmeterol 500-50 MCG/DOSE Aepb   Commonly known as: ADVAIR   Inhale 1 puff into the lungs every 12 (twelve) hours.      ipratropium 0.02 % nebulizer solution   Commonly known as: ATROVENT   Take 2.5 mLs (500 mcg total) by nebulization 4 (four) times daily.      levothyroxine 125 MCG tablet   Commonly known as: SYNTHROID, LEVOTHROID   Take 125 mcg by mouth daily.      montelukast 10 MG tablet   Commonly known as: SINGULAIR   Take 10 mg by mouth at bedtime.      morphine 15 MG tablet   Commonly known as: MSIR   Take 15 mg by mouth every 4 (four) hours as needed. pain      morphine 30 MG 12 hr tablet   Commonly known as: MS CONTIN   Take 30 mg by mouth 2 (two) times daily.  predniSONE 10 MG tablet   Commonly known as: DELTASONE   Take 6 tabs PO daily x 2 days, 5 tabs x 2 days, 4 tabs x 2 days, 3 tabs x 2 days, 2 tabs x 2 days, 1 tab x 2 days, then stop.      ranitidine 150 MG tablet   Commonly known as: ZANTAC   Take 1 tablet (150 mg total) by mouth 2 (two) times daily.      traZODone 100 MG tablet   Commonly known as: DESYREL   Take 100 mg by mouth at bedtime.          Follow-up Information    Follow up with Karle Plumber, MD. Schedule an appointment as soon as possible for a visit in 1 week.   Contact information:   [address] [place] [address] [phone]           The results of significant diagnostics from this hospitalization (including imaging, microbiology, ancillary and laboratory) are listed below for reference.    Significant Diagnostic Studies: Dg Chest 2 View  09/18/2012  *RADIOLOGY REPORT*  Clinical Data: Cough.  Wheezing.  Shortness of breath.  Asthma.  CHEST - 2 VIEW  Comparison: 07/07/2011  Findings: Patient is partially rotated to the right however there is also some element right lung volume loss.  Airspace disease is seen in the right upper lobe, suspicious for pneumonia.  Left lung is clear.  No evidence of pleural effusion.  Hilar and mediastinal structures are difficult to evaluate due to patient rotation.  Impression:  Right upper lobe airspace disease and volume loss, suspicious for pneumonia. Post-treatment  radiographic followup recommended to confirm resolution.   Original Report Authenticated By: Myles Rosenthal, M.D.     Microbiology: Recent Results (from the past 240 hour(s))  HERPES SIMPLEX VIRUS CULTURE     Status: Normal (Preliminary result)   Collection Time   09/18/12 10:40 AM      Component Value Range Status Comment   Specimen Description VAGINA   Final    Special Requests Normal   Final    Culture Culture has been initiated.   Final    Report Status PENDING   Incomplete   MRSA PCR SCREENING     Status: Abnormal   Collection Time   09/18/12  2:00 PM      Component Value Range Status Comment   MRSA by PCR POSITIVE (*) NEGATIVE Final   CULTURE, BLOOD (ROUTINE X 2)     Status: Normal (Preliminary result)   Collection Time   09/18/12  2:26 PM      Component Value Range Status Comment   Specimen Description BLOOD RIGHT ARM   Final    Special Requests BOTTLES DRAWN AEROBIC AND ANAEROBIC 4CC    Final    Culture  Setup Time 09/18/2012 20:21   Final    Culture     Final    Value:        BLOOD CULTURE RECEIVED NO GROWTH TO DATE CULTURE WILL BE HELD FOR 5 DAYS BEFORE ISSUING A FINAL NEGATIVE REPORT   Report Status PENDING   Incomplete   CULTURE, BLOOD (ROUTINE X 2)     Status: Normal (Preliminary result)   Collection Time   09/18/12  2:33 PM      Component Value Range Status Comment   Specimen Description BLOOD LEFT HAND   Final    Special Requests BOTTLES DRAWN AEROBIC ONLY 5CC   Final  Culture  Setup Time 09/18/2012 20:21   Final    Culture     Final    Value:        BLOOD CULTURE RECEIVED NO GROWTH TO DATE CULTURE WILL BE HELD FOR 5 DAYS BEFORE ISSUING A FINAL NEGATIVE REPORT   Report Status PENDING   Incomplete      Labs: Basic Metabolic Panel:  Lab 09/20/12 4696 09/19/12 0206 09/18/12 0944  NA 141 135 138  K 4.2 4.4 4.3  CL 107 101 102  CO2 27 25 --  GLUCOSE 160* 162* 123*  BUN 16 13 7   CREATININE 0.84 0.86 1.20*  CALCIUM 9.0 9.1 --  MG -- -- --  PHOS -- -- --   Liver Function Tests: No results found for this basename: AST:5,ALT:5,ALKPHOS:5,BILITOT:5,PROT:5,ALBUMIN:5 in the last 168 hours No results found for this basename: LIPASE:5,AMYLASE:5 in the last 168 hours No results found for this basename: AMMONIA:5 in the last 168 hours CBC:  Lab 09/20/12 0515 09/19/12 0206 09/18/12 0944 09/18/12 0930  WBC 15.1* 17.1* -- 13.7*  NEUTROABS -- -- -- --  HGB 10.3* 11.0* 14.3 12.5  HCT 32.8* 35.0* 42.0 39.9  MCV 90.9 89.3 -- 90.1  PLT 226 243 -- 261   Cardiac Enzymes:  Lab 09/19/12 0206 09/18/12 1933 09/18/12 1426  CKTOTAL -- -- --  CKMB -- -- --  CKMBINDEX -- -- --  TROPONINI <0.30 <0.30 <0.30   BNP: BNP (last 3 results) No results found for this basename: PROBNP:3 in the last 8760 hours CBG:  Lab 09/22/12 1203 09/22/12 0754 09/21/12 2056 09/21/12 1719 09/21/12 1132  GLUCAP 93 119* 116* 119* 128*    Time coordinating discharge: 45  minutes  Signed:  Ambrosio Reuter  [facility] 09/22/2012, 12:56 PM

## 2012-09-21 NOTE — Progress Notes (Signed)
No labs available

## 2012-09-22 DIAGNOSIS — J309 Allergic rhinitis, unspecified: Secondary | ICD-10-CM

## 2012-09-22 DIAGNOSIS — M129 Arthropathy, unspecified: Secondary | ICD-10-CM

## 2012-09-22 DIAGNOSIS — F411 Generalized anxiety disorder: Secondary | ICD-10-CM

## 2012-09-22 DIAGNOSIS — J9600 Acute respiratory failure, unspecified whether with hypoxia or hypercapnia: Secondary | ICD-10-CM

## 2012-09-22 DIAGNOSIS — J45902 Unspecified asthma with status asthmaticus: Secondary | ICD-10-CM

## 2012-09-22 LAB — HERPES SIMPLEX VIRUS CULTURE: Special Requests: NORMAL

## 2012-09-22 MED ORDER — PREDNISONE 10 MG PO TABS: ORAL_TABLET | ORAL | Status: DC

## 2012-09-22 MED ORDER — ALPRAZOLAM 0.25 MG PO TABS: 1.0000 mg | ORAL_TABLET | Freq: Two times a day (BID) | ORAL | Status: DC | PRN

## 2012-09-22 MED ORDER — ALBUTEROL SULFATE (2.5 MG/3ML) 0.083% IN NEBU: 2.5000 mg | INHALATION_SOLUTION | RESPIRATORY_TRACT | Status: DC | PRN

## 2012-09-22 MED ORDER — FLUTICASONE PROPIONATE 50 MCG/ACT NA SUSP: 2.0000 | Freq: Every day | NASAL | Status: DC

## 2012-09-22 MED ORDER — RANITIDINE HCL 150 MG PO TABS: 150.0000 mg | ORAL_TABLET | Freq: Two times a day (BID) | ORAL | Status: DC

## 2012-09-22 MED ORDER — BENZONATATE 200 MG PO CAPS: 200.0000 mg | ORAL_CAPSULE | Freq: Three times a day (TID) | ORAL | Status: DC

## 2012-09-22 MED ORDER — DSS 100 MG PO CAPS: 100.0000 mg | ORAL_CAPSULE | Freq: Two times a day (BID) | ORAL | Status: DC

## 2012-09-22 MED ORDER — BISACODYL 5 MG PO TBEC: 10.0000 mg | DELAYED_RELEASE_TABLET | Freq: Every day | ORAL | Status: DC | PRN

## 2012-09-22 MED ORDER — IPRATROPIUM BROMIDE 0.02 % IN SOLN: 500.0000 ug | Freq: Four times a day (QID) | RESPIRATORY_TRACT | Status: DC

## 2012-09-22 NOTE — Progress Notes (Signed)
Patient has Medicaid insurance for medical and prescription coverage Health Benefit Plan Coverage Coverage status: Active Coverage Covered: Individual Insurance Type: HMO Description:  ACCESS Primary Care Provider: [facility] Address: [address] [place], [place] [address] Contact: [facility] Telephone: [phone] Telephone: [phone] Coverage status: Active Coverage Covered: Individual Insurance Type: Other Description: 38 Other/Additional Payer's Identifiers: Group Number: [id number] Insurance Policy Number: [id number] Eligibility Dates: Eligibility: 07/30/2005 Insured (Other Insurance): Woody Address: [address] [place] [address] Contact: Andree Elk [facility] Telephone: [phone]

## 2012-09-24 LAB — CULTURE, BLOOD (ROUTINE X 2)

## 2012-09-27 ENCOUNTER — Emergency Department (HOSPITAL_COMMUNITY)
Admission: EM | Admit: 2012-09-27 | Discharge: 2012-09-27 | Disposition: A | Discharge status: Home / Self Care | Payer: Medicaid Other | Attending: Emergency Medicine | Admitting: Emergency Medicine

## 2012-09-27 DIAGNOSIS — J45909 Unspecified asthma, uncomplicated: Secondary | ICD-10-CM | POA: Insufficient documentation

## 2012-09-27 DIAGNOSIS — G4700 Insomnia, unspecified: Secondary | ICD-10-CM | POA: Insufficient documentation

## 2012-09-27 DIAGNOSIS — Z79899 Other long term (current) drug therapy: Secondary | ICD-10-CM | POA: Insufficient documentation

## 2012-09-27 DIAGNOSIS — G8929 Other chronic pain: Secondary | ICD-10-CM | POA: Insufficient documentation

## 2012-09-27 DIAGNOSIS — K5190 Ulcerative colitis, unspecified, without complications: Secondary | ICD-10-CM | POA: Insufficient documentation

## 2012-09-27 DIAGNOSIS — Z90710 Acquired absence of both cervix and uterus: Secondary | ICD-10-CM | POA: Insufficient documentation

## 2012-09-27 DIAGNOSIS — E039 Hypothyroidism, unspecified: Secondary | ICD-10-CM | POA: Insufficient documentation

## 2012-09-27 DIAGNOSIS — Z87891 Personal history of nicotine dependence: Secondary | ICD-10-CM | POA: Insufficient documentation

## 2012-09-27 DIAGNOSIS — K219 Gastro-esophageal reflux disease without esophagitis: Secondary | ICD-10-CM | POA: Insufficient documentation

## 2012-09-27 DIAGNOSIS — Z9089 Acquired absence of other organs: Secondary | ICD-10-CM | POA: Insufficient documentation

## 2012-09-27 DIAGNOSIS — F411 Generalized anxiety disorder: Secondary | ICD-10-CM | POA: Insufficient documentation

## 2012-09-27 DIAGNOSIS — F329 Major depressive disorder, single episode, unspecified: Secondary | ICD-10-CM | POA: Insufficient documentation

## 2012-09-27 DIAGNOSIS — R197 Diarrhea, unspecified: Secondary | ICD-10-CM | POA: Insufficient documentation

## 2012-09-27 DIAGNOSIS — M549 Dorsalgia, unspecified: Secondary | ICD-10-CM | POA: Insufficient documentation

## 2012-09-27 DIAGNOSIS — R109 Unspecified abdominal pain: Secondary | ICD-10-CM | POA: Insufficient documentation

## 2012-09-27 DIAGNOSIS — R112 Nausea with vomiting, unspecified: Secondary | ICD-10-CM | POA: Insufficient documentation

## 2012-09-27 DIAGNOSIS — F3289 Other specified depressive episodes: Secondary | ICD-10-CM | POA: Insufficient documentation

## 2012-09-27 DIAGNOSIS — M25569 Pain in unspecified knee: Secondary | ICD-10-CM | POA: Insufficient documentation

## 2012-09-27 DIAGNOSIS — J309 Allergic rhinitis, unspecified: Secondary | ICD-10-CM | POA: Insufficient documentation

## 2012-09-27 DIAGNOSIS — IMO0002 Reserved for concepts with insufficient information to code with codable children: Secondary | ICD-10-CM | POA: Insufficient documentation

## 2012-09-27 LAB — CBC WITH DIFFERENTIAL/PLATELET
Basophils Absolute: 0 10*3/uL (ref 0.0–0.1)
Basophils Relative: 0 % (ref 0–1)
Eosinophils Absolute: 0 10*3/uL (ref 0.0–0.7)
Eosinophils Relative: 0 % (ref 0–5)
MCH: 28.1 pg (ref 26.0–34.0)
MCV: 87.2 fL (ref 78.0–100.0)
Platelets: 383 10*3/uL (ref 150–400)
RDW: 15.1 % (ref 11.5–15.5)
WBC: 12.5 10*3/uL — ABNORMAL HIGH (ref 4.0–10.5)

## 2012-09-27 LAB — POCT I-STAT, CHEM 8
Calcium, Ion: 0.99 mmol/L — ABNORMAL LOW (ref 1.12–1.23)
HCT: 45 % (ref 36.0–46.0)
TCO2: 22 mmol/L (ref 0–100)

## 2012-09-27 LAB — LIPASE, BLOOD: Lipase: 19 U/L (ref 11–59)

## 2012-09-27 MED ORDER — HYDROMORPHONE HCL PF 1 MG/ML IJ SOLN
1.0000 mg | Freq: Once | INTRAMUSCULAR | Status: AC
  Administered 2012-09-27: 1 mg via INTRAVENOUS
  Filled 2012-09-27: qty 1

## 2012-09-27 MED ORDER — ONDANSETRON HCL 4 MG/2ML IJ SOLN
4.0000 mg | Freq: Once | INTRAMUSCULAR | Status: AC
  Administered 2012-09-27: 4 mg via INTRAVENOUS
  Filled 2012-09-27: qty 2

## 2012-09-27 MED ORDER — SODIUM CHLORIDE 0.9 % IV SOLN
Freq: Once | INTRAVENOUS | Status: AC
  Administered 2012-09-27: 21:00:00 via INTRAVENOUS

## 2012-09-27 NOTE — ED Notes (Signed)
JYN:[id number]<AO> Expected date:09/27/12<BR> Expected time: 6:20 PM<BR> Means of arrival:Ambulance<BR> Comments:<BR> N/V/D

## 2012-09-27 NOTE — ED Provider Notes (Signed)
History     CSN: [id number]  Arrival date & time 09/27/12  9562   First MD Initiated Contact with Patient 09/27/12 1938      Chief Complaint  Patient presents with  . Abdominal Pain  . Nausea  . Emesis  . Diarrhea    (Consider location/radiation/quality/duration/timing/severity/associated sxs/prior treatment) HPI Patient presents with abdominal pain, nausea, vomiting, diarrhea.  She states that he began approximately 2 days ago, since onset symptoms have been persistent, with no relief from anything.  She states that she is also out of her home medication.  She states that these symptoms are focally about the left upper quadrant, with minimal radiation, the pain is sharp.  She associates this pain with ulcer of colitis flares. She denies fever, chills, headache, confusion, disorientation.  Past Medical History  Diagnosis Date  . Asthma   . Ulcerative colitis   . Anxiety   . Depression   . Chronic back pain   . Chronic knee pain   . Thyroid disease     hypo  . GERD (gastroesophageal reflux disease)   . Insomnia   . Allergic rhinitis   . Arthritis     Past Surgical History  Procedure Laterality Date  . Knee surgery      x3, two on left and one on right.  Arthoscopy  . Cholecystectomy    . Abdominal hysterectomy    . Cesarean section    . Tonsillectomy      Family History  Problem Relation Age of Onset  . High blood pressure Mother   . Bipolar disorder Mother   . Asthma Father   . Parkinson's disease Father     History  Substance Use Topics  . Smoking status: Former Smoker -- 0.25 packs/day    Types: Cigarettes    Quit date: 03/20/2012  . Smokeless tobacco: Never Used  . Alcohol Use: No    OB History   Grav Para Term Preterm Abortions TAB SAB Ect Mult Living                  Review of Systems  Constitutional:       Per HPI, otherwise negative  HENT:       Per HPI, otherwise negative  Respiratory:       Per HPI, otherwise negative   Cardiovascular:       Per HPI, otherwise negative  Gastrointestinal: Positive for nausea, vomiting, abdominal pain and diarrhea.  Endocrine:       Negative aside from HPI  Genitourinary:       Neg aside from HPI   Musculoskeletal:       Per HPI, otherwise negative  Skin: Negative.   Neurological: Negative for syncope.    Allergies  Penicillins  Home Medications   Current Outpatient Rx  Name  Route  Sig  Dispense  Refill  . albuterol (PROVENTIL) (2.5 MG/3ML) 0.083% nebulizer solution   Nebulization   Take 3 mLs (2.5 mg total) by nebulization every 4 (four) hours as needed for wheezing or shortness of breath.   75 mL   0   . ALPRAZolam (XANAX) 0.25 MG tablet   Oral   Take 4 tablets (1 mg total) by mouth 2 (two) times daily as needed for anxiety.   30 tablet   0   . buPROPion (WELLBUTRIN XL) 300 MG 24 hr tablet   Oral   Take 300 mg by mouth daily.         . citalopram (  CELEXA) 10 MG tablet   Oral   Take 40 mg by mouth daily.          . fluticasone (FLONASE) 50 MCG/ACT nasal spray   Nasal   Place 2 sprays into the nose daily.   16 g   0   . Fluticasone-Salmeterol (ADVAIR) 500-50 MCG/DOSE AEPB   Inhalation   Inhale 1 puff into the lungs every 12 (twelve) hours.         Marland Kitchen ipratropium (ATROVENT) 0.02 % nebulizer solution   Nebulization   Take 2.5 mLs (500 mcg total) by nebulization 4 (four) times daily.   75 mL   0   . levothyroxine (SYNTHROID, LEVOTHROID) 125 MCG tablet   Oral   Take 125 mcg by mouth daily.           . montelukast (SINGULAIR) 10 MG tablet   Oral   Take 10 mg by mouth at bedtime.           Marland Kitchen morphine (MS CONTIN) 30 MG 12 hr tablet   Oral   Take 30 mg by mouth 2 (two) times daily.         Marland Kitchen morphine (MSIR) 15 MG tablet   Oral   Take 15 mg by mouth every 4 (four) hours as needed. pain         . predniSONE (DELTASONE) 10 MG tablet      Take 6 tabs PO daily x 2 days, 5 tabs x 2 days, 4 tabs x 2 days, 3 tabs x 2 days, 2  tabs x 2 days, 1 tab x 2 days, then stop.   42 tablet   0   . ranitidine (ZANTAC) 150 MG tablet   Oral   Take 1 tablet (150 mg total) by mouth 2 (two) times daily.   60 tablet   0   . traZODone (DESYREL) 100 MG tablet   Oral   Take 100 mg by mouth at bedtime.             BP 133/89  Pulse 66  Temp(Src) 98 F (36.7 C) (Oral)  Resp 21  SpO2 100%  Physical Exam  Nursing note and vitals reviewed. Constitutional: She is oriented to person, place, and time. She appears well-developed and well-nourished. No distress.  HENT:  Head: Normocephalic and atraumatic.  Eyes: Conjunctivae and EOM are normal.  Cardiovascular: Normal rate and regular rhythm.   Pulmonary/Chest: Effort normal and breath sounds normal. No stridor. No respiratory distress.  Abdominal: She exhibits no distension. There is tenderness in the epigastric area and left upper quadrant.  Musculoskeletal: She exhibits no edema.  Neurological: She is alert and oriented to person, place, and time. No cranial nerve deficit.  Skin: Skin is warm and dry.  Psychiatric: She has a normal mood and affect.    ED Course  Procedures (including critical care time)  Labs Reviewed  CBC WITH DIFFERENTIAL - Abnormal; Notable for the following:    WBC 12.5 (*)    Neutrophils Relative 79 (*)    Neutro Abs 9.8 (*)    All other components within normal limits  POCT I-STAT, CHEM 8 - Abnormal; Notable for the following:    Calcium, Ion 0.99 (*)    Hemoglobin 15.3 (*)    All other components within normal limits  LIPASE, BLOOD  URINALYSIS, ROUTINE W REFLEX MICROSCOPIC   No results found.   No diagnosis found.  Update: Patient is substantially better.  MDM  This patient with  chronic abdominal pain/ulcerative colitis presents with new left upper quadrant, epigastric pain.  On exam she is afebrile, with unremarkable vital signs.  The patient was substantially with fluids, analgesics, antiemetics.  She was discharged in stable  condition after this improvement, with PMD followup  Gerhard Munch, MD 09/27/12 [phone]

## 2012-09-27 NOTE — ED Notes (Signed)
Pt c/o nausea, vomiting, abdominal pain and diarrhea x 2 days. Pt states she has UC and she thinks this is a flare up.

## 2013-04-01 ENCOUNTER — Emergency Department (HOSPITAL_BASED_OUTPATIENT_CLINIC_OR_DEPARTMENT_OTHER): Payer: Medicaid Other

## 2013-04-01 ENCOUNTER — Encounter (HOSPITAL_BASED_OUTPATIENT_CLINIC_OR_DEPARTMENT_OTHER): Payer: Self-pay | Admitting: *Deleted

## 2013-04-01 ENCOUNTER — Inpatient Hospital Stay (HOSPITAL_BASED_OUTPATIENT_CLINIC_OR_DEPARTMENT_OTHER)
Admission: EM | Admit: 2013-04-01 | Discharge: 2013-04-04 | DRG: 193 | Disposition: A | Discharge status: Home / Self Care | Payer: Medicaid Other | Attending: Internal Medicine | Admitting: Internal Medicine

## 2013-04-01 DIAGNOSIS — F419 Anxiety disorder, unspecified: Secondary | ICD-10-CM

## 2013-04-01 DIAGNOSIS — E039 Hypothyroidism, unspecified: Secondary | ICD-10-CM | POA: Diagnosis present

## 2013-04-01 DIAGNOSIS — J9601 Acute respiratory failure with hypoxia: Secondary | ICD-10-CM

## 2013-04-01 DIAGNOSIS — F319 Bipolar disorder, unspecified: Secondary | ICD-10-CM | POA: Diagnosis present

## 2013-04-01 DIAGNOSIS — E079 Disorder of thyroid, unspecified: Secondary | ICD-10-CM

## 2013-04-01 DIAGNOSIS — J9692 Respiratory failure, unspecified with hypercapnia: Secondary | ICD-10-CM

## 2013-04-01 DIAGNOSIS — G8929 Other chronic pain: Secondary | ICD-10-CM | POA: Diagnosis present

## 2013-04-01 DIAGNOSIS — S0990XA Unspecified injury of head, initial encounter: Secondary | ICD-10-CM | POA: Diagnosis present

## 2013-04-01 DIAGNOSIS — R51 Headache: Secondary | ICD-10-CM | POA: Diagnosis present

## 2013-04-01 DIAGNOSIS — D72829 Elevated white blood cell count, unspecified: Secondary | ICD-10-CM

## 2013-04-01 DIAGNOSIS — M25579 Pain in unspecified ankle and joints of unspecified foot: Secondary | ICD-10-CM | POA: Diagnosis present

## 2013-04-01 DIAGNOSIS — F411 Generalized anxiety disorder: Secondary | ICD-10-CM | POA: Diagnosis present

## 2013-04-01 DIAGNOSIS — J154 Pneumonia due to other streptococci: Secondary | ICD-10-CM

## 2013-04-01 DIAGNOSIS — J309 Allergic rhinitis, unspecified: Secondary | ICD-10-CM

## 2013-04-01 DIAGNOSIS — K219 Gastro-esophageal reflux disease without esophagitis: Secondary | ICD-10-CM | POA: Diagnosis present

## 2013-04-01 DIAGNOSIS — J9600 Acute respiratory failure, unspecified whether with hypoxia or hypercapnia: Secondary | ICD-10-CM | POA: Diagnosis present

## 2013-04-01 DIAGNOSIS — J441 Chronic obstructive pulmonary disease with (acute) exacerbation: Secondary | ICD-10-CM | POA: Diagnosis present

## 2013-04-01 DIAGNOSIS — F329 Major depressive disorder, single episode, unspecified: Secondary | ICD-10-CM

## 2013-04-01 DIAGNOSIS — IMO0002 Reserved for concepts with insufficient information to code with codable children: Secondary | ICD-10-CM

## 2013-04-01 DIAGNOSIS — J189 Pneumonia, unspecified organism: Principal | ICD-10-CM

## 2013-04-01 DIAGNOSIS — M1990 Unspecified osteoarthritis, unspecified site: Secondary | ICD-10-CM

## 2013-04-01 DIAGNOSIS — D649 Anemia, unspecified: Secondary | ICD-10-CM | POA: Diagnosis present

## 2013-04-01 DIAGNOSIS — R4182 Altered mental status, unspecified: Secondary | ICD-10-CM

## 2013-04-01 DIAGNOSIS — J45902 Unspecified asthma with status asthmaticus: Secondary | ICD-10-CM

## 2013-04-01 DIAGNOSIS — W010XXA Fall on same level from slipping, tripping and stumbling without subsequent striking against object, initial encounter: Secondary | ICD-10-CM | POA: Diagnosis present

## 2013-04-01 DIAGNOSIS — Z87891 Personal history of nicotine dependence: Secondary | ICD-10-CM

## 2013-04-01 DIAGNOSIS — G4700 Insomnia, unspecified: Secondary | ICD-10-CM

## 2013-04-01 DIAGNOSIS — Z880 Allergy status to penicillin: Secondary | ICD-10-CM

## 2013-04-01 DIAGNOSIS — F1920 Other psychoactive substance dependence, uncomplicated: Secondary | ICD-10-CM | POA: Diagnosis present

## 2013-04-01 HISTORY — DX: Chronic obstructive pulmonary disease, unspecified: J44.9

## 2013-04-01 LAB — COMPREHENSIVE METABOLIC PANEL
ALT: 38 U/L — ABNORMAL HIGH (ref 0–35)
AST: 65 U/L — ABNORMAL HIGH (ref 0–37)
Albumin: 3.2 g/dL — ABNORMAL LOW (ref 3.5–5.2)
CO2: 31 mEq/L (ref 19–32)
Chloride: 103 mEq/L (ref 96–112)
Creatinine, Ser: 1.1 mg/dL (ref 0.50–1.10)
GFR calc non Af Amer: 61 mL/min — ABNORMAL LOW (ref >90)
Potassium: 3.9 mEq/L (ref 3.5–5.1)
Sodium: 141 mEq/L (ref 135–145)
Total Bilirubin: 1 mg/dL (ref 0.3–1.2)

## 2013-04-01 LAB — POCT I-STAT 3, ART BLOOD GAS (G3+)
Bicarbonate: 30 mEq/L — ABNORMAL HIGH (ref 20.0–24.0)
pCO2 arterial: 59.3 mmHg (ref 35.0–45.0)
pO2, Arterial: 65 mmHg — ABNORMAL LOW (ref 80.0–100.0)

## 2013-04-01 LAB — URINALYSIS, ROUTINE W REFLEX MICROSCOPIC
Bilirubin Urine: NEGATIVE
Hgb urine dipstick: NEGATIVE
Nitrite: POSITIVE — AB
Protein, ur: NEGATIVE mg/dL
Urobilinogen, UA: 0.2 mg/dL (ref 0.0–1.0)

## 2013-04-01 LAB — URINE MICROSCOPIC-ADD ON

## 2013-04-01 LAB — RAPID URINE DRUG SCREEN, HOSP PERFORMED
Amphetamines: NOT DETECTED
Barbiturates: NOT DETECTED
Opiates: POSITIVE — AB
Tetrahydrocannabinol: NOT DETECTED

## 2013-04-01 LAB — CBC WITH DIFFERENTIAL/PLATELET
Basophils Absolute: 0 10*3/uL (ref 0.0–0.1)
Basophils Relative: 0 % (ref 0–1)
HCT: 34.5 % — ABNORMAL LOW (ref 36.0–46.0)
Lymphocytes Relative: 16 % (ref 12–46)
MCHC: 29.6 g/dL — ABNORMAL LOW (ref 30.0–36.0)
Monocytes Absolute: 0.7 10*3/uL (ref 0.1–1.0)
Neutro Abs: 13.2 10*3/uL — ABNORMAL HIGH (ref 1.7–7.7)
Neutrophils Relative %: 79 % — ABNORMAL HIGH (ref 43–77)
Platelets: 264 10*3/uL (ref 150–400)
RDW: 15.3 % (ref 11.5–15.5)
WBC: 16.8 10*3/uL — ABNORMAL HIGH (ref 4.0–10.5)

## 2013-04-01 MED ORDER — ONDANSETRON HCL 4 MG/2ML IJ SOLN
4.0000 mg | Freq: Once | INTRAMUSCULAR | Status: AC
  Administered 2013-04-01: 4 mg via INTRAVENOUS
  Filled 2013-04-01: qty 2

## 2013-04-01 MED ORDER — IOHEXOL 300 MG/ML  SOLN
80.0000 mL | Freq: Once | INTRAMUSCULAR | Status: AC | PRN
  Administered 2013-04-01: 80 mL via INTRAVENOUS

## 2013-04-01 MED ORDER — DEXTROSE 5 % IV SOLN
500.0000 mg | Freq: Once | INTRAVENOUS | Status: AC
  Administered 2013-04-02: 500 mg via INTRAVENOUS
  Filled 2013-04-01: qty 500

## 2013-04-01 MED ORDER — DEXTROSE 5 % IV SOLN
1.0000 g | Freq: Once | INTRAVENOUS | Status: AC
  Administered 2013-04-01: 1 g via INTRAVENOUS
  Filled 2013-04-01: qty 10

## 2013-04-01 NOTE — Progress Notes (Signed)
Patient ambulated while on pulse oximeter.  Patient's SPO2 dropped to 83% after walking 60 feet.  Patient returned to her bed and placed back on 2 liter nasal cannula.

## 2013-04-01 NOTE — ED Notes (Addendum)
EMS reports patient C/O dyspnea due to a diagnosis of pneumonia 4 days ago. Patient C/O falling early Monday morning. States that she struck her head.  Patient states that she felt dizzy and lightheaded.  States that she stepped in a hole and fell again twisting her ankle.  C/O having a headache with pain behind her ears and in her neck. Patient denies LOC.

## 2013-04-01 NOTE — ED Provider Notes (Signed)
CSN: [id number]     Arrival date & time 04/01/13  2030 History  This chart was scribed for Glynn Octave, MD by Bennett Scrape, ED Scribe. This patient was seen in room MH08/MH08 and the patient's care was started at 8:53 PM.   Chief Complaint  Patient presents with  . Shortness of Breath    The history is provided by the patient. No language interpreter was used.   HPI Comments: Debra [name] is a 42 y.o. female with a h/o COPD and asthma brought in by ambulance, who presents to the Emergency Department complaining of constant dyspnea due to an asthma attack that started gradually today and has been worsening since onset. She reports a recent PNA diagnosis 4 days ago. She states that she has used her albuterol inhaler x5 today with no improvement and wears O2 at home PRN, mostly at night. Her oxygen saturation was 83% upon arrival but improved to 94% with 2L [place]. Pt states that she currently feels improved with the [place] oxygen. She states that she has been tested for sleep apnea before but denies any prior diagnoses.  She also c/o a fall in a bedroom shelf that occurred 3 days ago with positive head trauma. She states that she did loose consciousness and has been having a constant frontal HA with 3 associated episodes of emesis and intermittent lightheadedness. Last episode of emesis was yesterday morning. Mother witnessed the fall and denies LOC.  Pt also reports that she tripped in a hole in the yard and fell yesterday. Pt states that she felt dizzy at the time of the fall and c/o associated  right ankle pain. She denies being evaluated until today and states that she came in due to the asthma attack. She states that she has been taking pain medication since the age of 4 after a severe trauma. She admits to taking ibuprofen, morphine and Xanax today with no improvement. Currently, her words are slurred and her voice is hoarse. Mother states that this is not her baseline.   Past Medical History   Diagnosis Date  . Asthma   . Ulcerative colitis   . Anxiety   . Depression   . Chronic back pain   . Chronic knee pain   . Thyroid disease     hypo  . GERD (gastroesophageal reflux disease)   . Insomnia   . Allergic rhinitis   . Arthritis    Past Surgical History  Procedure Laterality Date  . Knee surgery      x3, two on left and one on right.  Arthoscopy  . Cholecystectomy    . Abdominal hysterectomy    . Cesarean section    . Tonsillectomy     Family History  Problem Relation Age of Onset  . High blood pressure Mother   . Bipolar disorder Mother   . Asthma Father   . Parkinson's disease Father    History  Substance Use Topics  . Smoking status: Former Smoker -- 0.25 packs/day    Types: Cigarettes    Quit date: 03/20/2012  . Smokeless tobacco: Never Used  . Alcohol Use: No   No OB history provided.  Review of Systems  A complete 10 system review of systems was obtained and all systems are negative except as noted in the HPI and PMH.   Allergies  Penicillins  Home Medications   Current Outpatient Rx  Name  Route  Sig  Dispense  Refill  . albuterol (PROVENTIL) (2.5 MG/3ML)  0.083% nebulizer solution   Nebulization   Take 3 mLs (2.5 mg total) by nebulization every 4 (four) hours as needed for wheezing or shortness of breath.   75 mL   0   . ALPRAZolam (XANAX) 0.25 MG tablet   Oral   Take 4 tablets (1 mg total) by mouth 2 (two) times daily as needed for anxiety.   30 tablet   0   . buPROPion (WELLBUTRIN XL) 300 MG 24 hr tablet   Oral   Take 300 mg by mouth daily.         . citalopram (CELEXA) 10 MG tablet   Oral   Take 40 mg by mouth daily.          . fluticasone (FLONASE) 50 MCG/ACT nasal spray   Nasal   Place 2 sprays into the nose daily.   16 g   0   . Fluticasone-Salmeterol (ADVAIR) 500-50 MCG/DOSE AEPB   Inhalation   Inhale 1 puff into the lungs every 12 (twelve) hours.         Marland Kitchen ipratropium (ATROVENT) 0.02 % nebulizer  solution   Nebulization   Take 2.5 mLs (500 mcg total) by nebulization 4 (four) times daily.   75 mL   0   . levothyroxine (SYNTHROID, LEVOTHROID) 125 MCG tablet   Oral   Take 125 mcg by mouth daily.           . montelukast (SINGULAIR) 10 MG tablet   Oral   Take 10 mg by mouth at bedtime.           Marland Kitchen morphine (MS CONTIN) 30 MG 12 hr tablet   Oral   Take 30 mg by mouth 2 (two) times daily.         Marland Kitchen morphine (MSIR) 15 MG tablet   Oral   Take 15 mg by mouth every 4 (four) hours as needed. pain         . predniSONE (DELTASONE) 10 MG tablet      Take 6 tabs PO daily x 2 days, 5 tabs x 2 days, 4 tabs x 2 days, 3 tabs x 2 days, 2 tabs x 2 days, 1 tab x 2 days, then stop.   42 tablet   0   . ranitidine (ZANTAC) 150 MG tablet   Oral   Take 1 tablet (150 mg total) by mouth 2 (two) times daily.   60 tablet   0   . traZODone (DESYREL) 100 MG tablet   Oral   Take 100 mg by mouth at bedtime.            Triage Vitals: BP 102/50  Pulse 80  Temp(Src) 98.2 F (36.8 C) (Oral)  Resp 20  SpO2 83%  Physical Exam  Nursing note and vitals reviewed. Constitutional: She is oriented to person, place, and time. She appears well-developed and well-nourished. No distress.  HENT:  Head: Normocephalic.  Abrasion to left forehead, no trismus, dentition is normal  Eyes: Conjunctivae and EOM are normal. Pupils are equal, round, and reactive to light.  Neck: Neck supple. No tracheal deviation present.  No cervical spine tenderness, no step offs or deformities  Cardiovascular: Normal rate, regular rhythm and intact distal pulses.   Pulmonary/Chest: Effort normal and breath sounds normal. No respiratory distress. She has no wheezes.  Abdominal: Soft. She exhibits no distension.  Musculoskeletal: Normal range of motion. She exhibits tenderness. She exhibits no edema.  Mild right lateral malleolus tenderness, no deformity or  swelling  Neurological: She is alert and oriented to person,  place, and time. No sensory deficit.  Slurring words, appears overmedicated, 5/5 strength throughout, moving all extremities   Skin: Skin is warm and dry.  ecchymosis to left ulnar forearm  Psychiatric: She has a normal mood and affect. Her behavior is normal.    ED Course   DIAGNOSTIC STUDIES: Oxygen Saturation is 98% on [place], normal by my interpretation.    COORDINATION OF CARE: 9:02 PM-Advised pt that she will not receive any narcotics for pain which pt verbalized her understanding of. Discussed treatment plan which includes CT of c-spine, CT of head, x-ray of ankle, CXR, CBC panel, CMP and UA with pt at bedside and pt agreed to plan.   Procedures (including critical care time)  Labs Reviewed  CBC WITH DIFFERENTIAL - Abnormal; Notable for the following:    WBC 16.8 (*)    Hemoglobin 10.2 (*)    HCT 34.5 (*)    MCHC 29.6 (*)    Neutrophils Relative % 79 (*)    Neutro Abs 13.2 (*)    All other components within normal limits  COMPREHENSIVE METABOLIC PANEL - Abnormal; Notable for the following:    Albumin 3.2 (*)    AST 65 (*)    ALT 38 (*)    GFR calc non Af Amer 61 (*)    GFR calc Af Amer 70 (*)    All other components within normal limits  URINE RAPID DRUG SCREEN (HOSP PERFORMED) - Abnormal; Notable for the following:    Opiates POSITIVE (*)    Benzodiazepines POSITIVE (*)    All other components within normal limits  URINALYSIS, ROUTINE W REFLEX MICROSCOPIC - Abnormal; Notable for the following:    Nitrite POSITIVE (*)    All other components within normal limits  URINE MICROSCOPIC-ADD ON - Abnormal; Notable for the following:    Bacteria, UA MANY (*)    All other components within normal limits  POCT I-STAT 3, BLOOD GAS (G3+) - Abnormal; Notable for the following:    pH, Arterial 7.310 (*)    pCO2 arterial 59.3 (*)    pO2, Arterial 65.0 (*)    Bicarbonate 30.0 (*)    Acid-Base Excess 3.0 (*)    All other components within normal limits  TROPONIN I  D-DIMER,  QUANTITATIVE   Dg Chest 2 View  04/01/2013   *RADIOLOGY REPORT*  Clinical Data: Shortness of breath.  Dyspnea.  CHEST - 2 VIEW  Comparison: Chest x-ray 09/18/2012.  Findings: Diffuse interstitial and patchy air space disease noted throughout the right lung, with some evidence of volume loss is demonstrated by slight rightward shift of cardiomediastinal structures.  Fullness in the right infrahilar region noted only on the frontal projection.  Left lung is clear.  No definite pleural effusions.  Heart size is within normal limits.  IMPRESSION: 1.  Highly unusual appearance of the thorax.  Findings may suggest post infectious or inflammatory scarring or post inflammatory process such as cryptogenic organizing pneumonia, particularly given the apparent right-sided pneumonia on chest x-ray 09/18/2012. However, there is soft tissue fullness in the right infrahilar region which could be indicative of an obstructing neoplasm or lymphadenopathy.  Clinical correlation is recommended, with consideration for further evaluation with contrast enhanced chest CT to better evaluate these findings.   Original Report Authenticated By: Trudie Reed, M.D.   Dg Forearm Right  04/01/2013   *RADIOLOGY REPORT*  Clinical Data: Right forearm pain.  History of fall.  RIGHT FOREARM - 2 VIEW  Comparison: No priors.  Findings: AP and lateral views of the right forearm demonstrate no acute displaced fractures.  Overlying soft tissues are unremarkable.  IMPRESSION: 1.  No acute radiographic abnormality of the right radius or ulna.   Original Report Authenticated By: Trudie Reed, M.D.   Dg Ankle Complete Right  04/01/2013   *RADIOLOGY REPORT*  Clinical Data: Shortness of breath.  History of fall.  Ankle pain.  RIGHT ANKLE - COMPLETE 3+ VIEW  Comparison: No priors.  Findings: Three views of the right ankle demonstrate no definite acute displaced fracture, subluxation, dislocation, joint or soft tissue abnormality.  A small plantar  calcaneal spur incidentally noted.  IMPRESSION: No acute radiographic abnormality of the right ankle.   Original Report Authenticated By: Trudie Reed, M.D.   Ct Head Wo Contrast  04/01/2013   *RADIOLOGY REPORT*  Clinical Data: Trauma post fall 3 days ago with headache and neck pain as well as shortness of breath, nausea and dizziness.  CT HEAD WITHOUT CONTRAST  Technique:  Contiguous axial images were obtained from the base of the skull through the vertex without contrast.  Comparison: None.  Findings: Ventricles, cisterns and others CSF spaces are within normal.  There is no mass, mass effect, shift midline structures or acute hemorrhage.  There is no evidence of acute infarction. Remaining bones and soft tissues are unremarkable.  IMPRESSION: No acute intracranial findings.   Original Report Authenticated By: Elberta Fortis, M.D.   Ct Angio Chest Pe W/cm &/or Wo Cm  04/01/2013   *RADIOLOGY REPORT*  Clinical Data: Shortness of breath and chest pain.  Abnormal chest x-ray.  CT ANGIOGRAPHY CHEST  Technique:  Multidetector CT imaging of the chest using the standard protocol during bolus administration of intravenous contrast. Multiplanar reconstructed images including MIPs were obtained and reviewed to evaluate the vascular anatomy.  Contrast: 80mL OMNIPAQUE IOHEXOL 300 MG/ML  SOLN  Comparison: No priors.  Findings:  Mediastinum: There are no filling defects within the pulmonary arterial tree to suggest underlying pulmonary embolism. Heart size is mildly enlarged. There is no significant pericardial fluid, thickening or pericardial calcification.  Numerous borderline enlarged and mildly enlarged mediastinal and right hilar lymph nodes are noted, with the largest node or a conglomeration of lymph nodes in the right hilar region measuring up to 2.1 x 1.4 cm. Small hiatal hernia.  Lungs/Pleura: There is diffuse bronchial wall thickening in the lungs bilaterally (right greater than left), and patchy areas of  profound thickening of the peribronchovascular interstitium throughout the right lung.  In addition, there are innumerable centrilobular ground-glass attenuation micro and micronodules throughout the right lung, with a small amount in the medial aspect of the left lower lobe as well.  In several areas within the right lung these nodules have formed confluent consolidative areas.  No pleural effusion.  No centrally obstructing endobronchial lesion is identified.  Upper Abdomen: Status post cholecystectomy.  Musculoskeletal: There are no aggressive appearing lytic or blastic lesions noted in the visualized portions of the skeleton.  IMPRESSION: 1.  No evidence of pulmonary embolism. 2.  Findings on the recent chest x-ray appear to represent a very severe multilobar bronchopneumonia in the right lung with some early endobronchial spread of infection into the medial aspect of the left lower lobe.  At this time, there is no definite central obstructing lesion identified. 3.  Borderline enlarged and mildly enlarged mediastinal and right hilar lymph nodes are presumably reactive. 4.  Mild cardiomegaly. 5.  Small hiatal hernia. 6.  Status post cholecystectomy.   Original Report Authenticated By: Trudie Reed, M.D.   Ct Cervical Spine Wo Contrast  04/01/2013   *RADIOLOGY REPORT*  Clinical Data: Post fall 3 days ago with neck pain.  CT CERVICAL SPINE WITHOUT CONTRAST  Technique:  Multidetector CT imaging of the cervical spine was performed. Multiplanar CT image reconstructions were also generated.  Comparison: None.  Findings: Vertebral body alignment, heights and disc spaces are within normal.  There is minimal spondylosis present.  Prevertebral soft tissues are within normal.  There is no evidence of compression fracture or subluxation.  Remainder of the exam is within normal.  IMPRESSION: No acute findings.   Original Report Authenticated By: Elberta Fortis, M.D.   1. Respiratory failure with hypercapnia   2. CAP  (community acquired pneumonia)   3. Mental status change     MDM  Patient with multiple complaints. Had a fall 3 days ago and struck her head and that headache and dizziness since then. Feels her breathing worsens tonight and her asthma is acting up. Denies any chest pain or abdominal pain.  She is slurring her words and appears to be overly medicated. She is not wheezing on exam. Initially hypoxic in the 80s with improvement in the mid 90s with 2 L. She does wear 2 L of oxygen at home as needed.  Hypoxic to 80s on RA. 90s on 2L [place]. CXR abnormal.  CT without PE but severe appearing RLL pneumonia.  Antibiotics started.  Mental status appears to be depressed 2/2 overmedication. CT head negative. CO2 retention on ABG.  bipap started.  No wheezing on exam.   Date: 04/01/2013  Rate: 76  Rhythm: normal sinus rhythm  QRS Axis: normal  Intervals: normal  ST/T Wave abnormalities: normal  Conduction Disutrbances:none  Narrative Interpretation:   Old EKG Reviewed: unchanged  CRITICAL CARE Performed by: Glynn Octave Total critical care time: 40 Critical care time was exclusive of separately billable procedures and treating other patients. Critical care was necessary to treat or prevent imminent or life-threatening deterioration. Critical care was time spent personally by me on the following activities: development of treatment plan with patient and/or surrogate as well as nursing, discussions with consultants, evaluation of patient's response to treatment, examination of patient, obtaining history from patient or surrogate, ordering and performing treatments and interventions, ordering and review of laboratory studies, ordering and review of radiographic studies, pulse oximetry and re-evaluation of patient's condition.     I personally performed the services described in this documentation, which was scribed in my presence. The recorded information has been reviewed and is  accurate.    Glynn Octave, MD 04/02/13 [phone]

## 2013-04-01 NOTE — Progress Notes (Signed)
Patient SPO2 is 85% on room air.  Patient placed on 2 Liter nasal cannula.  Patient's SPO2 increased and remained at 94%

## 2013-04-02 ENCOUNTER — Encounter (HOSPITAL_COMMUNITY): Payer: Self-pay | Admitting: *Deleted

## 2013-04-02 DIAGNOSIS — J189 Pneumonia, unspecified organism: Secondary | ICD-10-CM

## 2013-04-02 DIAGNOSIS — J9692 Respiratory failure, unspecified with hypercapnia: Secondary | ICD-10-CM

## 2013-04-02 DIAGNOSIS — J9600 Acute respiratory failure, unspecified whether with hypoxia or hypercapnia: Secondary | ICD-10-CM

## 2013-04-02 DIAGNOSIS — E039 Hypothyroidism, unspecified: Secondary | ICD-10-CM | POA: Diagnosis present

## 2013-04-02 DIAGNOSIS — J45902 Unspecified asthma with status asthmaticus: Secondary | ICD-10-CM

## 2013-04-02 LAB — CBC WITH DIFFERENTIAL/PLATELET
Basophils Absolute: 0 10*3/uL (ref 0.0–0.1)
Basophils Relative: 0 % (ref 0–1)
Eosinophils Relative: 0 % (ref 0–5)
HCT: 34.6 % — ABNORMAL LOW (ref 36.0–46.0)
MCH: 25.7 pg — ABNORMAL LOW (ref 26.0–34.0)
MCHC: 30.1 g/dL (ref 30.0–36.0)
MCV: 85.6 fL (ref 78.0–100.0)
Monocytes Absolute: 0.1 10*3/uL (ref 0.1–1.0)
RDW: 15.4 % (ref 11.5–15.5)

## 2013-04-02 LAB — COMPREHENSIVE METABOLIC PANEL
ALT: 35 U/L (ref 0–35)
Albumin: 3.3 g/dL — ABNORMAL LOW (ref 3.5–5.2)
Alkaline Phosphatase: 90 U/L (ref 39–117)
Glucose, Bld: 123 mg/dL — ABNORMAL HIGH (ref 70–99)
Potassium: 4.3 mEq/L (ref 3.5–5.1)
Sodium: 139 mEq/L (ref 135–145)
Total Protein: 6.8 g/dL (ref 6.0–8.3)

## 2013-04-02 LAB — TSH: TSH: 0.465 u[IU]/mL (ref 0.350–4.500)

## 2013-04-02 MED ORDER — METHYLPREDNISOLONE SODIUM SUCC 125 MG IJ SOLR
60.0000 mg | Freq: Two times a day (BID) | INTRAMUSCULAR | Status: DC
  Administered 2013-04-02 (×2): 60 mg via INTRAVENOUS
  Filled 2013-04-02 (×5): qty 0.96

## 2013-04-02 MED ORDER — ALBUTEROL SULFATE (5 MG/ML) 0.5% IN NEBU
2.5000 mg | INHALATION_SOLUTION | Freq: Four times a day (QID) | RESPIRATORY_TRACT | Status: DC
  Administered 2013-04-02 – 2013-04-03 (×6): 2.5 mg via RESPIRATORY_TRACT
  Filled 2013-04-02 (×4): qty 0.5

## 2013-04-02 MED ORDER — CITALOPRAM HYDROBROMIDE 40 MG PO TABS
40.0000 mg | ORAL_TABLET | Freq: Every day | ORAL | Status: DC
  Administered 2013-04-02 – 2013-04-04 (×3): 40 mg via ORAL
  Filled 2013-04-02 (×3): qty 1

## 2013-04-02 MED ORDER — BUDESONIDE 0.25 MG/2ML IN SUSP
0.2500 mg | Freq: Two times a day (BID) | RESPIRATORY_TRACT | Status: DC
  Administered 2013-04-02 – 2013-04-04 (×5): 0.25 mg via RESPIRATORY_TRACT
  Filled 2013-04-02 (×7): qty 2

## 2013-04-02 MED ORDER — BUDESONIDE 0.25 MG/2ML IN SUSP
0.2500 mg | Freq: Two times a day (BID) | RESPIRATORY_TRACT | Status: DC
  Filled 2013-04-02 (×5): qty 2

## 2013-04-02 MED ORDER — LEVOTHYROXINE SODIUM 125 MCG PO TABS
125.0000 ug | ORAL_TABLET | Freq: Every day | ORAL | Status: DC
  Administered 2013-04-02 – 2013-04-04 (×3): 125 ug via ORAL
  Filled 2013-04-02 (×5): qty 1

## 2013-04-02 MED ORDER — SODIUM CHLORIDE 0.9 % IJ SOLN
3.0000 mL | Freq: Two times a day (BID) | INTRAMUSCULAR | Status: DC
  Administered 2013-04-02 – 2013-04-04 (×5): 3 mL via INTRAVENOUS

## 2013-04-02 MED ORDER — ENOXAPARIN SODIUM 40 MG/0.4ML ~~LOC~~ SOLN
40.0000 mg | SUBCUTANEOUS | Status: DC
  Administered 2013-04-02: 40 mg via SUBCUTANEOUS
  Filled 2013-04-02 (×2): qty 0.4

## 2013-04-02 MED ORDER — ALPRAZOLAM 0.5 MG PO TABS: 1.0000 mg | ORAL_TABLET | Freq: Two times a day (BID) | ORAL | Status: DC | PRN

## 2013-04-02 MED ORDER — METHYLPREDNISOLONE SODIUM SUCC 40 MG IJ SOLR
40.0000 mg | Freq: Every day | INTRAMUSCULAR | Status: DC
  Filled 2013-04-02: qty 1

## 2013-04-02 MED ORDER — BUPROPION HCL ER (XL) 300 MG PO TB24
300.0000 mg | ORAL_TABLET | Freq: Every day | ORAL | Status: DC
  Administered 2013-04-02 – 2013-04-04 (×3): 300 mg via ORAL
  Filled 2013-04-02 (×3): qty 1

## 2013-04-02 MED ORDER — IPRATROPIUM BROMIDE 0.02 % IN SOLN
0.5000 mg | Freq: Once | RESPIRATORY_TRACT | Status: AC
  Administered 2013-04-02: 0.5 mg via RESPIRATORY_TRACT
  Filled 2013-04-02: qty 2.5

## 2013-04-02 MED ORDER — ALBUTEROL SULFATE (5 MG/ML) 0.5% IN NEBU
5.0000 mg | INHALATION_SOLUTION | Freq: Once | RESPIRATORY_TRACT | Status: AC
  Administered 2013-04-02: 5 mg via RESPIRATORY_TRACT
  Filled 2013-04-02: qty 1

## 2013-04-02 MED ORDER — ACETAMINOPHEN 650 MG RE SUPP: 650.0000 mg | Freq: Four times a day (QID) | RECTAL | Status: DC | PRN

## 2013-04-02 MED ORDER — ONDANSETRON HCL 4 MG/2ML IJ SOLN: 4.0000 mg | Freq: Four times a day (QID) | INTRAMUSCULAR | Status: DC | PRN

## 2013-04-02 MED ORDER — ACETAMINOPHEN 325 MG PO TABS
650.0000 mg | ORAL_TABLET | Freq: Four times a day (QID) | ORAL | Status: DC | PRN
  Administered 2013-04-02: 650 mg via ORAL
  Filled 2013-04-02: qty 2

## 2013-04-02 MED ORDER — CEFTRIAXONE SODIUM 1 G IJ SOLR
1.0000 g | INTRAMUSCULAR | Status: DC
  Administered 2013-04-02 – 2013-04-03 (×2): 1 g via INTRAVENOUS
  Filled 2013-04-02 (×4): qty 10

## 2013-04-02 MED ORDER — SODIUM CHLORIDE 0.9 % IJ SOLN
3.0000 mL | Freq: Two times a day (BID) | INTRAMUSCULAR | Status: DC
  Administered 2013-04-02 – 2013-04-04 (×3): 3 mL via INTRAVENOUS

## 2013-04-02 MED ORDER — DEXTROSE 5 % IV SOLN
500.0000 mg | INTRAVENOUS | Status: DC
  Administered 2013-04-02: 500 mg via INTRAVENOUS
  Filled 2013-04-02 (×2): qty 500

## 2013-04-02 MED ORDER — MORPHINE SULFATE ER 30 MG PO TBCR: 30.0000 mg | EXTENDED_RELEASE_TABLET | Freq: Two times a day (BID) | ORAL | Status: DC

## 2013-04-02 MED ORDER — ALBUTEROL SULFATE (5 MG/ML) 0.5% IN NEBU: 2.5000 mg | INHALATION_SOLUTION | RESPIRATORY_TRACT | Status: DC | PRN

## 2013-04-02 MED ORDER — ONDANSETRON HCL 4 MG/2ML IJ SOLN: 4.0000 mg | Freq: Three times a day (TID) | INTRAMUSCULAR | Status: DC | PRN

## 2013-04-02 MED ORDER — TRAZODONE HCL 100 MG PO TABS
100.0000 mg | ORAL_TABLET | Freq: Every day | ORAL | Status: DC
  Filled 2013-04-02: qty 1

## 2013-04-02 MED ORDER — ALPRAZOLAM 0.25 MG PO TABS
0.5000 mg | ORAL_TABLET | Freq: Two times a day (BID) | ORAL | Status: DC | PRN
  Administered 2013-04-02 (×2): 0.5 mg via ORAL
  Filled 2013-04-02 (×2): qty 2

## 2013-04-02 MED ORDER — ACETAMINOPHEN 500 MG PO TABS
ORAL_TABLET | ORAL | Status: AC
  Administered 2013-04-02: 1000 mg via ORAL
  Filled 2013-04-02: qty 2

## 2013-04-02 MED ORDER — FLUTICASONE PROPIONATE 50 MCG/ACT NA SUSP
2.0000 | Freq: Every day | NASAL | Status: DC
  Administered 2013-04-02 – 2013-04-04 (×3): 2 via NASAL
  Filled 2013-04-02: qty 16

## 2013-04-02 MED ORDER — MONTELUKAST SODIUM 10 MG PO TABS
10.0000 mg | ORAL_TABLET | Freq: Every day | ORAL | Status: DC
  Administered 2013-04-02 – 2013-04-03 (×2): 10 mg via ORAL
  Filled 2013-04-02 (×3): qty 1

## 2013-04-02 MED ORDER — FAMOTIDINE 20 MG PO TABS
20.0000 mg | ORAL_TABLET | Freq: Two times a day (BID) | ORAL | Status: DC
  Administered 2013-04-02 – 2013-04-04 (×5): 20 mg via ORAL
  Filled 2013-04-02 (×7): qty 1

## 2013-04-02 MED ORDER — IPRATROPIUM BROMIDE 0.02 % IN SOLN
0.5000 mg | Freq: Four times a day (QID) | RESPIRATORY_TRACT | Status: DC
  Administered 2013-04-02 – 2013-04-03 (×6): 0.5 mg via RESPIRATORY_TRACT
  Filled 2013-04-02 (×4): qty 2.5

## 2013-04-02 MED ORDER — ONDANSETRON HCL 4 MG PO TABS: 4.0000 mg | ORAL_TABLET | Freq: Four times a day (QID) | ORAL | Status: DC | PRN

## 2013-04-02 MED ORDER — ACETAMINOPHEN 500 MG PO TABS: 1000.0000 mg | ORAL_TABLET | Freq: Once | ORAL | Status: AC

## 2013-04-02 MED ORDER — MORPHINE SULFATE 15 MG PO TABS
15.0000 mg | ORAL_TABLET | ORAL | Status: DC | PRN
  Administered 2013-04-02: 15 mg via ORAL
  Filled 2013-04-02: qty 1

## 2013-04-02 MED ORDER — MORPHINE SULFATE ER 15 MG PO TBCR
15.0000 mg | EXTENDED_RELEASE_TABLET | Freq: Two times a day (BID) | ORAL | Status: DC
  Administered 2013-04-02 (×2): 15 mg via ORAL
  Filled 2013-04-02 (×2): qty 1

## 2013-04-02 MED ORDER — METHYLPREDNISOLONE SODIUM SUCC 125 MG IJ SOLR
125.0000 mg | Freq: Once | INTRAMUSCULAR | Status: AC
  Administered 2013-04-02: 125 mg via INTRAVENOUS
  Filled 2013-04-02: qty 2

## 2013-04-02 NOTE — ED Notes (Signed)
Jasmine, RN on 2600 called and updated on admin of Tylenol and that pt was en route to her.

## 2013-04-02 NOTE — H&P (Addendum)
[facility] History and Physical  TORIANA [name] WJX:[id number] DOB: [birth year] DOA: 04/01/2013  Referring physician: ER physician. Patient was transferred from [facility]. PCP: Karle Plumber, MD   Chief Complaint: Shortness of breath.  HPI: Debra [name] is a 42 y.o. female with history of asthma chronic pain and hypothyroidism presented to the ER because of shortness of breath. Patient states she's been short of breath last 2 weeks which has been gradually worsening with nonproductive cough. Patient also has subjective feeling of fever chills. Denies any chest pain diaphoresis or any palpitations. In addition patient had a fall 3 days ago which patient states she came down some heaps of books. CT head was negative for any acute. Patient had CT angiography chest which shows pneumonic process. Since patient also was found to be mildly lethargic probably from her chronic pain and antianxiety medications and ABG was done which showed mild hypercarbia. Patient has been admitted for further management and placed on BiPAP. Antibiotics have been ordered he started for pneumonia. Patient was admitted to the hospital was in March at [facility]. Patient was admitted to [facility] in January of this year for pneumonia.  Review of Systems: As presented in the history of presenting illness, rest negative.  Past Medical History  Diagnosis Date  . Asthma   . Ulcerative colitis   . Anxiety   . Depression   . Chronic back pain   . Chronic knee pain   . Thyroid disease     hypo  . GERD (gastroesophageal reflux disease)   . Insomnia   . Allergic rhinitis   . Arthritis   . COPD (chronic obstructive pulmonary disease)    Past Surgical History  Procedure Laterality Date  . Knee surgery      x3, two on left and one on right.  Arthoscopy  . Cholecystectomy    . Abdominal hysterectomy    . Cesarean section    . Tonsillectomy    . Joint replacement       2x on left, 1x on right   Social History:  reports that she quit smoking about 17 months ago. Her smoking use included Cigarettes. She has a 3 pack-year smoking history. She has never used smokeless tobacco. She reports that she does not drink alcohol or use illicit drugs. Home. where does patient live-- Can do ADLs. Can patient participate in ADLs?  Allergies  Allergen Reactions  . Penicillins Swelling and Rash    Family History  Problem Relation Age of Onset  . High blood pressure Mother   . Bipolar disorder Mother   . Asthma Father   . Parkinson's disease Father       Prior to Admission medications   Medication Sig Start Date End Date Taking? Authorizing Provider  albuterol (PROVENTIL) (2.5 MG/3ML) 0.083% nebulizer solution Take 3 mLs (2.5 mg total) by nebulization every 4 (four) hours as needed for wheezing or shortness of breath. 09/22/12   Renae Fickle, MD  ALPRAZolam Prudy Feeler) 0.25 MG tablet Take 1 mg by mouth 2 (two) times daily as needed for anxiety (2-3 times a day PRN anxiety). 09/22/12   Renae Fickle, MD  buPROPion (WELLBUTRIN XL) 300 MG 24 hr tablet Take 300 mg by mouth daily.    Historical Provider, MD  citalopram (CELEXA) 10 MG tablet Take 40 mg by mouth daily.     Historical Provider, MD  fluticasone (FLONASE) 50 MCG/ACT nasal spray Place 2 sprays into the nose  daily. 09/22/12   Renae Fickle, MD  Fluticasone-Salmeterol (ADVAIR) 500-50 MCG/DOSE AEPB Inhale 1 puff into the lungs every 12 (twelve) hours.    Historical Provider, MD  ipratropium (ATROVENT) 0.02 % nebulizer solution Take 2.5 mLs (500 mcg total) by nebulization 4 (four) times daily. 09/22/12   Renae Fickle, MD  levothyroxine (SYNTHROID, LEVOTHROID) 125 MCG tablet Take 125 mcg by mouth daily.      Historical Provider, MD  montelukast (SINGULAIR) 10 MG tablet Take 10 mg by mouth at bedtime.      Historical Provider, MD  morphine (MS CONTIN) 30 MG 12 hr tablet Take 30 mg by mouth 2 (two) times daily.     Historical Provider, MD  morphine (MSIR) 15 MG tablet Take 15 mg by mouth every 4 (four) hours as needed. pain    Historical Provider, MD  predniSONE (DELTASONE) 10 MG tablet Take 6 tabs PO daily x 2 days, 5 tabs x 2 days, 4 tabs x 2 days, 3 tabs x 2 days, 2 tabs x 2 days, 1 tab x 2 days, then stop. 09/22/12   Renae Fickle, MD  ranitidine (ZANTAC) 150 MG tablet Take 1 tablet (150 mg total) by mouth 2 (two) times daily. 09/22/12   Renae Fickle, MD  traZODone (DESYREL) 100 MG tablet Take 100 mg by mouth at bedtime.      Historical Provider, MD   Physical Exam: Filed Vitals:   04/02/13 0037 04/02/13 0043 04/02/13 0230 04/02/13 0236  BP:  109/73 96/63 96/63   Pulse:  88 78 77  Temp:    97.8 F (36.6 C)  TempSrc:    Oral  Resp:  10 21 14   Height:    5\' 2"  (1.575 m)  Weight:    104.4 kg (230 lb 2.6 oz)  SpO2: 96% 96% 100% 98%     General:  Well-developed well-nourished.  Eyes:  Anicteric no pallor.  ENT:  No discharge from ears eyes nose mouth.  Neck:  No mass felt.  Cardiovascular:  S1-S2 heard.  Respiratory:  At this time I don't hear any wheeze or crepitations.  Abdomen:  Soft nontender bowel sounds present.  Skin:  No rash.  Musculoskeletal:  No edema.  Psychiatric:  Appears normal.  Neurologic:  Alert oriented to time place and person. Moves all extremities.  Labs on Admission:  Basic Metabolic Panel:  Recent Labs Lab 04/01/13 2130  NA 141  K 3.9  CL 103  CO2 31  GLUCOSE 96  BUN 8  CREATININE 1.10  CALCIUM 9.0   Liver Function Tests:  Recent Labs Lab 04/01/13 2130  AST 65*  ALT 38*  ALKPHOS 87  BILITOT 1.0  PROT 6.5  ALBUMIN 3.2*   No results found for this basename: LIPASE, AMYLASE,  in the last 168 hours No results found for this basename: AMMONIA,  in the last 168 hours CBC:  Recent Labs Lab 04/01/13 2130  WBC 16.8*  NEUTROABS 13.2*  HGB 10.2*  HCT 34.5*  MCV 88.0  PLT 264   Cardiac Enzymes:  Recent Labs Lab 04/01/13 2130   TROPONINI <0.30    BNP (last 3 results) No results found for this basename: PROBNP,  in the last 8760 hours CBG: No results found for this basename: GLUCAP,  in the last 168 hours  Radiological Exams on Admission: Dg Chest 2 View  04/01/2013   *RADIOLOGY REPORT*  Clinical Data: Shortness of breath.  Dyspnea.  CHEST - 2 VIEW  Comparison: Chest x-ray 09/18/2012.  Findings:  Diffuse interstitial and patchy air space disease noted throughout the right lung, with some evidence of volume loss is demonstrated by slight rightward shift of cardiomediastinal structures.  Fullness in the right infrahilar region noted only on the frontal projection.  Left lung is clear.  No definite pleural effusions.  Heart size is within normal limits.  IMPRESSION: 1.  Highly unusual appearance of the thorax.  Findings may suggest post infectious or inflammatory scarring or post inflammatory process such as cryptogenic organizing pneumonia, particularly given the apparent right-sided pneumonia on chest x-ray 09/18/2012. However, there is soft tissue fullness in the right infrahilar region which could be indicative of an obstructing neoplasm or lymphadenopathy.  Clinical correlation is recommended, with consideration for further evaluation with contrast enhanced chest CT to better evaluate these findings.   Original Report Authenticated By: Trudie Reed, M.D.   Dg Forearm Right  04/01/2013   *RADIOLOGY REPORT*  Clinical Data: Right forearm pain.  History of fall.  RIGHT FOREARM - 2 VIEW  Comparison: No priors.  Findings: AP and lateral views of the right forearm demonstrate no acute displaced fractures.  Overlying soft tissues are unremarkable.  IMPRESSION: 1.  No acute radiographic abnormality of the right radius or ulna.   Original Report Authenticated By: Trudie Reed, M.D.   Dg Ankle Complete Right  04/01/2013   *RADIOLOGY REPORT*  Clinical Data: Shortness of breath.  History of fall.  Ankle pain.  RIGHT ANKLE -  COMPLETE 3+ VIEW  Comparison: No priors.  Findings: Three views of the right ankle demonstrate no definite acute displaced fracture, subluxation, dislocation, joint or soft tissue abnormality.  A small plantar calcaneal spur incidentally noted.  IMPRESSION: No acute radiographic abnormality of the right ankle.   Original Report Authenticated By: Trudie Reed, M.D.   Ct Head Wo Contrast  04/01/2013   *RADIOLOGY REPORT*  Clinical Data: Trauma post fall 3 days ago with headache and neck pain as well as shortness of breath, nausea and dizziness.  CT HEAD WITHOUT CONTRAST  Technique:  Contiguous axial images were obtained from the base of the skull through the vertex without contrast.  Comparison: None.  Findings: Ventricles, cisterns and others CSF spaces are within normal.  There is no mass, mass effect, shift midline structures or acute hemorrhage.  There is no evidence of acute infarction. Remaining bones and soft tissues are unremarkable.  IMPRESSION: No acute intracranial findings.   Original Report Authenticated By: Elberta Fortis, M.D.   Ct Angio Chest Pe W/cm &/or Wo Cm  04/01/2013   *RADIOLOGY REPORT*  Clinical Data: Shortness of breath and chest pain.  Abnormal chest x-ray.  CT ANGIOGRAPHY CHEST  Technique:  Multidetector CT imaging of the chest using the standard protocol during bolus administration of intravenous contrast. Multiplanar reconstructed images including MIPs were obtained and reviewed to evaluate the vascular anatomy.  Contrast: 80mL OMNIPAQUE IOHEXOL 300 MG/ML  SOLN  Comparison: No priors.  Findings:  Mediastinum: There are no filling defects within the pulmonary arterial tree to suggest underlying pulmonary embolism. Heart size is mildly enlarged. There is no significant pericardial fluid, thickening or pericardial calcification.  Numerous borderline enlarged and mildly enlarged mediastinal and right hilar lymph nodes are noted, with the largest node or a conglomeration of lymph nodes in  the right hilar region measuring up to 2.1 x 1.4 cm. Small hiatal hernia.  Lungs/Pleura: There is diffuse bronchial wall thickening in the lungs bilaterally (right greater than left), and patchy areas of profound thickening of the peribronchovascular  interstitium throughout the right lung.  In addition, there are innumerable centrilobular ground-glass attenuation micro and micronodules throughout the right lung, with a small amount in the medial aspect of the left lower lobe as well.  In several areas within the right lung these nodules have formed confluent consolidative areas.  No pleural effusion.  No centrally obstructing endobronchial lesion is identified.  Upper Abdomen: Status post cholecystectomy.  Musculoskeletal: There are no aggressive appearing lytic or blastic lesions noted in the visualized portions of the skeleton.  IMPRESSION: 1.  No evidence of pulmonary embolism. 2.  Findings on the recent chest x-ray appear to represent a very severe multilobar bronchopneumonia in the right lung with some early endobronchial spread of infection into the medial aspect of the left lower lobe.  At this time, there is no definite central obstructing lesion identified. 3.  Borderline enlarged and mildly enlarged mediastinal and right hilar lymph nodes are presumably reactive. 4.  Mild cardiomegaly. 5.  Small hiatal hernia. 6.  Status post cholecystectomy.   Original Report Authenticated By: Trudie Reed, M.D.   Ct Cervical Spine Wo Contrast  04/01/2013   *RADIOLOGY REPORT*  Clinical Data: Post fall 3 days ago with neck pain.  CT CERVICAL SPINE WITHOUT CONTRAST  Technique:  Multidetector CT imaging of the cervical spine was performed. Multiplanar CT image reconstructions were also generated.  Comparison: None.  Findings: Vertebral body alignment, heights and disc spaces are within normal.  There is minimal spondylosis present.  Prevertebral soft tissues are within normal.  There is no evidence of compression  fracture or subluxation.  Remainder of the exam is within normal.  IMPRESSION: No acute findings.   Original Report Authenticated By: Elberta Fortis, M.D.     Assessment/Plan Principal Problem:   Respiratory failure with hypercapnia Active Problems:   CAP (community acquired pneumonia)   Hypothyroidism   1. Acute respiratory failure secondary to pneumonia and asthma exacerbation - patient is on BiPAP which will be continued. Continue with ceftriaxone and Zithromax along with IV steroids Pulmicort nebulizer. Recheck ABG in a.m. Check urine strep and Legionella. 2. Chronic pain - patient's lethargy partially probably is contributed by her pain medications and antianxiety medications. Closely observe. 3. Hypothyroidism - continue present medications. Check TSH. 4. Anemia - chronic. Follow CBC. 5. Anxiety and depression - continue present medications.    Code Status:  Full code.  Family Communication:  None.  Disposition Plan:  Admit to inpatient.    Rayel Santizo N. [facility] Pager [phone].  If 7PM-7AM, please contact night-coverage [email or web address] Password [facility] 04/02/2013, 3:52 AM

## 2013-04-02 NOTE — Progress Notes (Signed)
Utilization Review Completed.  

## 2013-04-02 NOTE — Progress Notes (Signed)
[facility] PROGRESS NOTE  Debra [name] ZOX:[id number] DOB: November 16, 1970 DOA: 04/01/2013 PCP: Karle Plumber, MD  Assessment/Plan  Acute hypoxic respiratory failure secondary to community acquired pneumonia, acute asthma exacerbation and over sedation by narcotic and benzodiazepine medications  -  Minimize narcotics and benzodiazepines -  Continue antibiotics for CAP -  Continue bipap for now until more fully awake -  If she is still sleepy in a few more hours, will repeat ABG and consider narcan -  Continue nebs  -  Increase IV steroids -  Continue pulmicort  Allergic rhinitis, stable, continue flonase and singulair  Depression/anxiety, stable, oversedated - continue celexa and wellbutrin - 1/2 dose of xanax prn  Hypothyroidism, stable.  Continue synthroid  Chronic pain with narcotic dependence with oversedation -  1/2 dose long acting morphine, discontinue prn morphine -  Continue prn tylenol  Diet:  Regular once more awake Access:  PIV IVF:  OFF Proph:  lovenox  Code Status: full  Family Communication: patient alone Disposition Plan: pending improvement in respiratory status   Consultants:  None  Procedures:  CTa chest  Antibiotics:  Ceftriaxone 8/13 >>  Azithro 8/13 >>  HPI/Subjective:  Patient sleepy but endorses wheezing and tight chest.  Bipap mask in place  Objective: Filed Vitals:   04/02/13 0230 04/02/13 0236 04/02/13 0404 04/02/13 0811  BP: 96/63 96/63 121/85 97/64  Pulse: 78 77 74 70  Temp:  97.8 F (36.6 C) 97.6 F (36.4 C) 97.7 F (36.5 C)  TempSrc:  Oral Oral Axillary  Resp: 21 14 13 10   Height:  5\' 2"  (1.575 m)    Weight:  104.4 kg (230 lb 2.6 oz) 104.4 kg (230 lb 2.6 oz)   SpO2: 100% 98% 98% 95%    Intake/Output Summary (Last 24 hours) at 04/02/13 8119 Last data filed at 04/02/13 0600  Gross per 24 hour  Intake      0 ml  Output    400 ml  Net   -400 ml   Filed Weights   04/02/13 0236 04/02/13 0404  Weight:  104.4 kg (230 lb 2.6 oz) 104.4 kg (230 lb 2.6 oz)    Exam:   General:  Sleeping and difficult to arouse.  Only wakes up for a few seconds a at a time, then falls back asleep.  No acute distress  HEENT:  NCAT, MMM  Cardiovascular:  RRR, nl S1, S2 no mrg, 2+ pulses, warm extremities  Respiratory:  Diminished bilateral breath sounds, no rales or rhonchi anteriorly, no increased WOB  Abdomen:   NABS, soft, NT/ND  MSK:   Normal tone and bulk, no LEE  Neuro:  Grossly intact  Data Reviewed: Basic Metabolic Panel:  Recent Labs Lab 04/01/13 2130 04/02/13 0530  NA 141 139  K 3.9 4.3  CL 103 101  CO2 31 27  GLUCOSE 96 123*  BUN 8 8  CREATININE 1.10 0.98  CALCIUM 9.0 8.9   Liver Function Tests:  Recent Labs Lab 04/01/13 2130 04/02/13 0530  AST 65* 35  ALT 38* 35  ALKPHOS 87 90  BILITOT 1.0 0.6  PROT 6.5 6.8  ALBUMIN 3.2* 3.3*   No results found for this basename: LIPASE, AMYLASE,  in the last 168 hours No results found for this basename: AMMONIA,  in the last 168 hours CBC:  Recent Labs Lab 04/01/13 2130 04/02/13 0530  WBC 16.8* 13.8*  NEUTROABS 13.2* 13.2*  HGB 10.2* 10.4*  HCT 34.5* 34.6*  MCV 88.0 85.6  PLT 264  249   Cardiac Enzymes:  Recent Labs Lab 04/01/13 2130  TROPONINI <0.30   BNP (last 3 results) No results found for this basename: PROBNP,  in the last 8760 hours CBG: No results found for this basename: GLUCAP,  in the last 168 hours  No results found for this or any previous visit (from the past 240 hour(s)).   Studies: Dg Chest 2 View  04/01/2013   *RADIOLOGY REPORT*  Clinical Data: Shortness of breath.  Dyspnea.  CHEST - 2 VIEW  Comparison: Chest x-ray 09/18/2012.  Findings: Diffuse interstitial and patchy air space disease noted throughout the right lung, with some evidence of volume loss is demonstrated by slight rightward shift of cardiomediastinal structures.  Fullness in the right infrahilar region noted only on the frontal  projection.  Left lung is clear.  No definite pleural effusions.  Heart size is within normal limits.  IMPRESSION: 1.  Highly unusual appearance of the thorax.  Findings may suggest post infectious or inflammatory scarring or post inflammatory process such as cryptogenic organizing pneumonia, particularly given the apparent right-sided pneumonia on chest x-ray 09/18/2012. However, there is soft tissue fullness in the right infrahilar region which could be indicative of an obstructing neoplasm or lymphadenopathy.  Clinical correlation is recommended, with consideration for further evaluation with contrast enhanced chest CT to better evaluate these findings.   Original Report Authenticated By: Trudie Reed, M.D.   Dg Forearm Right  04/01/2013   *RADIOLOGY REPORT*  Clinical Data: Right forearm pain.  History of fall.  RIGHT FOREARM - 2 VIEW  Comparison: No priors.  Findings: AP and lateral views of the right forearm demonstrate no acute displaced fractures.  Overlying soft tissues are unremarkable.  IMPRESSION: 1.  No acute radiographic abnormality of the right radius or ulna.   Original Report Authenticated By: Trudie Reed, M.D.   Dg Ankle Complete Right  04/01/2013   *RADIOLOGY REPORT*  Clinical Data: Shortness of breath.  History of fall.  Ankle pain.  RIGHT ANKLE - COMPLETE 3+ VIEW  Comparison: No priors.  Findings: Three views of the right ankle demonstrate no definite acute displaced fracture, subluxation, dislocation, joint or soft tissue abnormality.  A small plantar calcaneal spur incidentally noted.  IMPRESSION: No acute radiographic abnormality of the right ankle.   Original Report Authenticated By: Trudie Reed, M.D.   Ct Head Wo Contrast  04/01/2013   *RADIOLOGY REPORT*  Clinical Data: Trauma post fall 3 days ago with headache and neck pain as well as shortness of breath, nausea and dizziness.  CT HEAD WITHOUT CONTRAST  Technique:  Contiguous axial images were obtained from the base of  the skull through the vertex without contrast.  Comparison: None.  Findings: Ventricles, cisterns and others CSF spaces are within normal.  There is no mass, mass effect, shift midline structures or acute hemorrhage.  There is no evidence of acute infarction. Remaining bones and soft tissues are unremarkable.  IMPRESSION: No acute intracranial findings.   Original Report Authenticated By: Elberta Fortis, M.D.   Ct Angio Chest Pe W/cm &/or Wo Cm  04/01/2013   *RADIOLOGY REPORT*  Clinical Data: Shortness of breath and chest pain.  Abnormal chest x-ray.  CT ANGIOGRAPHY CHEST  Technique:  Multidetector CT imaging of the chest using the standard protocol during bolus administration of intravenous contrast. Multiplanar reconstructed images including MIPs were obtained and reviewed to evaluate the vascular anatomy.  Contrast: 80mL OMNIPAQUE IOHEXOL 300 MG/ML  SOLN  Comparison: No priors.  Findings:  Mediastinum:  There are no filling defects within the pulmonary arterial tree to suggest underlying pulmonary embolism. Heart size is mildly enlarged. There is no significant pericardial fluid, thickening or pericardial calcification.  Numerous borderline enlarged and mildly enlarged mediastinal and right hilar lymph nodes are noted, with the largest node or a conglomeration of lymph nodes in the right hilar region measuring up to 2.1 x 1.4 cm. Small hiatal hernia.  Lungs/Pleura: There is diffuse bronchial wall thickening in the lungs bilaterally (right greater than left), and patchy areas of profound thickening of the peribronchovascular interstitium throughout the right lung.  In addition, there are innumerable centrilobular ground-glass attenuation micro and micronodules throughout the right lung, with a small amount in the medial aspect of the left lower lobe as well.  In several areas within the right lung these nodules have formed confluent consolidative areas.  No pleural effusion.  No centrally obstructing endobronchial  lesion is identified.  Upper Abdomen: Status post cholecystectomy.  Musculoskeletal: There are no aggressive appearing lytic or blastic lesions noted in the visualized portions of the skeleton.  IMPRESSION: 1.  No evidence of pulmonary embolism. 2.  Findings on the recent chest x-ray appear to represent a very severe multilobar bronchopneumonia in the right lung with some early endobronchial spread of infection into the medial aspect of the left lower lobe.  At this time, there is no definite central obstructing lesion identified. 3.  Borderline enlarged and mildly enlarged mediastinal and right hilar lymph nodes are presumably reactive. 4.  Mild cardiomegaly. 5.  Small hiatal hernia. 6.  Status post cholecystectomy.   Original Report Authenticated By: Trudie Reed, M.D.   Ct Cervical Spine Wo Contrast  04/01/2013   *RADIOLOGY REPORT*  Clinical Data: Post fall 3 days ago with neck pain.  CT CERVICAL SPINE WITHOUT CONTRAST  Technique:  Multidetector CT imaging of the cervical spine was performed. Multiplanar CT image reconstructions were also generated.  Comparison: None.  Findings: Vertebral body alignment, heights and disc spaces are within normal.  There is minimal spondylosis present.  Prevertebral soft tissues are within normal.  There is no evidence of compression fracture or subluxation.  Remainder of the exam is within normal.  IMPRESSION: No acute findings.   Original Report Authenticated By: Elberta Fortis, M.D.    Scheduled Meds: . albuterol  2.5 mg Nebulization Q6H  . azithromycin  500 mg Intravenous Q24H  . budesonide (PULMICORT) nebulizer solution  0.25 mg Nebulization BID  . budesonide (PULMICORT) nebulizer solution  0.25 mg Nebulization BID  . buPROPion  300 mg Oral Daily  . cefTRIAXone (ROCEPHIN)  IV  1 g Intravenous Q24H  . citalopram  40 mg Oral Daily  . enoxaparin (LOVENOX) injection  40 mg Subcutaneous Q24H  . famotidine  20 mg Oral BID  . fluticasone  2 spray Each Nare Daily  .  ipratropium  0.5 mg Nebulization Q6H  . levothyroxine  125 mcg Oral QAC breakfast  . methylPREDNISolone (SOLU-MEDROL) injection  60 mg Intravenous Q12H  . montelukast  10 mg Oral QHS  . morphine  15 mg Oral BID  . sodium chloride  3 mL Intravenous Q12H  . sodium chloride  3 mL Intravenous Q12H   Continuous Infusions:   Principal Problem:   Respiratory failure with hypercapnia Active Problems:   CAP (community acquired pneumonia)   Hypothyroidism    Time spent: 30 min    Perri Lamagna, Ms [facility]  [facility] Pager [phone]. If 7PM-7AM, please contact night-coverage at [email or web address], password [facility] 04/02/2013, 8:22  AM  LOS: 1 day

## 2013-04-02 NOTE — ED Notes (Signed)
Mother informed of rm number by phone

## 2013-04-03 DIAGNOSIS — K219 Gastro-esophageal reflux disease without esophagitis: Secondary | ICD-10-CM

## 2013-04-03 DIAGNOSIS — F329 Major depressive disorder, single episode, unspecified: Secondary | ICD-10-CM

## 2013-04-03 DIAGNOSIS — D72829 Elevated white blood cell count, unspecified: Secondary | ICD-10-CM

## 2013-04-03 LAB — LEGIONELLA ANTIGEN, URINE

## 2013-04-03 MED ORDER — AZITHROMYCIN 500 MG PO TABS
500.0000 mg | ORAL_TABLET | Freq: Every day | ORAL | Status: DC
  Administered 2013-04-03: 500 mg via ORAL
  Filled 2013-04-03 (×2): qty 1

## 2013-04-03 MED ORDER — ONDANSETRON HCL 4 MG PO TABS: 4.0000 mg | ORAL_TABLET | Freq: Four times a day (QID) | ORAL | Status: DC | PRN

## 2013-04-03 MED ORDER — ACETAMINOPHEN 650 MG RE SUPP: 650.0000 mg | Freq: Four times a day (QID) | RECTAL | Status: DC | PRN

## 2013-04-03 MED ORDER — ALPRAZOLAM 0.5 MG PO TABS
1.0000 mg | ORAL_TABLET | Freq: Two times a day (BID) | ORAL | Status: DC | PRN
  Administered 2013-04-03: 1 mg via ORAL
  Filled 2013-04-03: qty 4

## 2013-04-03 MED ORDER — MORPHINE SULFATE ER 15 MG PO TBCR
30.0000 mg | EXTENDED_RELEASE_TABLET | Freq: Two times a day (BID) | ORAL | Status: DC
  Administered 2013-04-03 – 2013-04-04 (×3): 30 mg via ORAL
  Filled 2013-04-03 (×2): qty 2
  Filled 2013-04-03: qty 1

## 2013-04-03 MED ORDER — ONDANSETRON HCL 4 MG/2ML IJ SOLN: 4.0000 mg | Freq: Four times a day (QID) | INTRAMUSCULAR | Status: DC | PRN

## 2013-04-03 MED ORDER — IPRATROPIUM BROMIDE 0.02 % IN SOLN
0.5000 mg | Freq: Three times a day (TID) | RESPIRATORY_TRACT | Status: DC
  Administered 2013-04-04: 0.5 mg via RESPIRATORY_TRACT
  Filled 2013-04-03: qty 2.5

## 2013-04-03 MED ORDER — ALBUTEROL SULFATE (5 MG/ML) 0.5% IN NEBU
2.5000 mg | INHALATION_SOLUTION | Freq: Three times a day (TID) | RESPIRATORY_TRACT | Status: DC
  Administered 2013-04-04: 2.5 mg via RESPIRATORY_TRACT
  Filled 2013-04-03: qty 0.5

## 2013-04-03 MED ORDER — ALBUTEROL SULFATE (5 MG/ML) 0.5% IN NEBU: 2.5000 mg | INHALATION_SOLUTION | RESPIRATORY_TRACT | Status: DC | PRN

## 2013-04-03 MED ORDER — METHYLPREDNISOLONE SODIUM SUCC 125 MG IJ SOLR
60.0000 mg | Freq: Every day | INTRAMUSCULAR | Status: DC
  Administered 2013-04-03 – 2013-04-04 (×2): 60 mg via INTRAVENOUS
  Filled 2013-04-03 (×2): qty 0.96

## 2013-04-03 MED ORDER — ACETAMINOPHEN 325 MG PO TABS: 650.0000 mg | ORAL_TABLET | Freq: Four times a day (QID) | ORAL | Status: DC | PRN

## 2013-04-03 MED ORDER — ALPRAZOLAM 0.5 MG PO TABS
1.0000 mg | ORAL_TABLET | Freq: Two times a day (BID) | ORAL | Status: DC | PRN
  Administered 2013-04-03 – 2013-04-04 (×2): 1 mg via ORAL
  Filled 2013-04-03 (×2): qty 2

## 2013-04-03 MED ORDER — ENOXAPARIN SODIUM 40 MG/0.4ML ~~LOC~~ SOLN
40.0000 mg | SUBCUTANEOUS | Status: DC
  Administered 2013-04-03: 40 mg via SUBCUTANEOUS
  Filled 2013-04-03 (×2): qty 0.4

## 2013-04-03 NOTE — Progress Notes (Signed)
Pharmacy Consult: lovenox for VTE prophylaxis   42 yo female here with SOB noted with CAP to begin lovenox for VTE prophylaxis/ She was previously on lovenox 40mg  sq q24hr and last dose was am of 04/02/13.  -SCr= 0.98, CrCl ~ 80 -Hg/Hct= 10.4/34.6 -pltc= 249  Plan -Restart lovenox tonight 40mg  sq q24hr -Will sign off for now, please contact pharmacy with any other needs  Thank you, Harland German, Pharm D 04/03/2013 5:06 PM

## 2013-04-03 NOTE — Progress Notes (Signed)
[facility] PROGRESS NOTE  Debra [name] ZOX:[id number] DOB: 1971-05-21 DOA: 04/01/2013 PCP: Karle Plumber, MD  Assessment/Plan  Acute hypoxic respiratory failure secondary to community acquired pneumonia, acute asthma exacerbation and over sedation by narcotic and benzodiazepine medications  -  Patient agreed to decrease xanax to twice daily from TID -  Patient agreed to no PRN narcotics and continue basal narcotics at previous dose -  Patient agreed to d/c trazodone. -  Continue antibiotics for CAP -  Continue nebs  -  Wean IV steroids -  Continue pulmicort -  Will include information about pulmonology for follow up -  Sleep study as outpatient.  Allergic rhinitis, stable, continue flonase and singulair  Depression/anxiety, uncontrolled.  - continue celexa and wellbutrin - Xanax as above -  Has follow up in 2 weeks with psychiatry.    Hypothyroidism, stable.  Continue synthroid  Chronic pain with narcotic dependence with oversedation -  See above -  Continue prn tylenol  Chronic steroid use -  Continue calcium/vit D -  No other underlying immunodeficiency so will not start prophylactic bactrim at this time.    Diet:  Regular  Access:  PIV IVF:  OFF Proph:  lovenox  Code Status: full  Family Communication: patient alone Disposition Plan: transfer to telemetry   Consultants:  None  Procedures:  CTa chest  Antibiotics:  Ceftriaxone 8/13 >>  Azithro 8/13 >>  HPI/Subjective:  Patient improved today.  Breathing and mentation improved.     Objective: Filed Vitals:   04/03/13 0400 04/03/13 0500 04/03/13 0600 04/03/13 0817  BP: 103/53 105/54 104/58 114/67  Pulse: 66 70 72 80  Temp:    97.7 F (36.5 C)  TempSrc:    Oral  Resp: 10 20 12 14   Height:      Weight:      SpO2: 95% 93% 96% 96%    Intake/Output Summary (Last 24 hours) at 04/03/13 0836 Last data filed at 04/02/13 2300  Gross per 24 hour  Intake    778 ml  Output   1175 ml   Net   -397 ml   Filed Weights   04/02/13 0236 04/02/13 0404  Weight: 104.4 kg (230 lb 2.6 oz) 104.4 kg (230 lb 2.6 oz)    Exam:   General:  Awake, alert, obese CF, no acute distress, nasal canula in place  HEENT:  NCAT, MMM  Cardiovascular:  RRR, nl S1, S2 no mrg, 2+ pulses, warm extremities  Respiratory:  Markedly improved aeration, no obvious rales, rhonchi, or wheeze, no increased WOB  Abdomen:   NABS, soft, NT/ND  MSK:   Normal tone and bulk, no LEE  Neuro:  Grossly intact  Data Reviewed: Basic Metabolic Panel:  Recent Labs Lab 04/01/13 2130 04/02/13 0530  NA 141 139  K 3.9 4.3  CL 103 101  CO2 31 27  GLUCOSE 96 123*  BUN 8 8  CREATININE 1.10 0.98  CALCIUM 9.0 8.9   Liver Function Tests:  Recent Labs Lab 04/01/13 2130 04/02/13 0530  AST 65* 35  ALT 38* 35  ALKPHOS 87 90  BILITOT 1.0 0.6  PROT 6.5 6.8  ALBUMIN 3.2* 3.3*   No results found for this basename: LIPASE, AMYLASE,  in the last 168 hours No results found for this basename: AMMONIA,  in the last 168 hours CBC:  Recent Labs Lab 04/01/13 2130 04/02/13 0530  WBC 16.8* 13.8*  NEUTROABS 13.2* 13.2*  HGB 10.2* 10.4*  HCT 34.5* 34.6*  MCV 88.0  85.6  PLT 264 249   Cardiac Enzymes:  Recent Labs Lab 04/01/13 2130  TROPONINI <0.30   BNP (last 3 results) No results found for this basename: PROBNP,  in the last 8760 hours CBG: No results found for this basename: GLUCAP,  in the last 168 hours  Recent Results (from the past 240 hour(s))  MRSA PCR SCREENING     Status: None   Collection Time    04/02/13  6:50 AM      Result Value Range Status   MRSA by PCR NEGATIVE  NEGATIVE Final   Comment:            The GeneXpert MRSA Assay (FDA     approved for NASAL specimens     only), is one component of a     comprehensive MRSA colonization     surveillance program. It is not     intended to diagnose MRSA     infection nor to guide or     monitor treatment for     MRSA infections.      Studies: Dg Chest 2 View  04/01/2013   *RADIOLOGY REPORT*  Clinical Data: Shortness of breath.  Dyspnea.  CHEST - 2 VIEW  Comparison: Chest x-ray 09/18/2012.  Findings: Diffuse interstitial and patchy air space disease noted throughout the right lung, with some evidence of volume loss is demonstrated by slight rightward shift of cardiomediastinal structures.  Fullness in the right infrahilar region noted only on the frontal projection.  Left lung is clear.  No definite pleural effusions.  Heart size is within normal limits.  IMPRESSION: 1.  Highly unusual appearance of the thorax.  Findings may suggest post infectious or inflammatory scarring or post inflammatory process such as cryptogenic organizing pneumonia, particularly given the apparent right-sided pneumonia on chest x-ray 09/18/2012. However, there is soft tissue fullness in the right infrahilar region which could be indicative of an obstructing neoplasm or lymphadenopathy.  Clinical correlation is recommended, with consideration for further evaluation with contrast enhanced chest CT to better evaluate these findings.   Original Report Authenticated By: Trudie Reed, M.D.   Dg Forearm Right  04/01/2013   *RADIOLOGY REPORT*  Clinical Data: Right forearm pain.  History of fall.  RIGHT FOREARM - 2 VIEW  Comparison: No priors.  Findings: AP and lateral views of the right forearm demonstrate no acute displaced fractures.  Overlying soft tissues are unremarkable.  IMPRESSION: 1.  No acute radiographic abnormality of the right radius or ulna.   Original Report Authenticated By: Trudie Reed, M.D.   Dg Ankle Complete Right  04/01/2013   *RADIOLOGY REPORT*  Clinical Data: Shortness of breath.  History of fall.  Ankle pain.  RIGHT ANKLE - COMPLETE 3+ VIEW  Comparison: No priors.  Findings: Three views of the right ankle demonstrate no definite acute displaced fracture, subluxation, dislocation, joint or soft tissue abnormality.  A small plantar  calcaneal spur incidentally noted.  IMPRESSION: No acute radiographic abnormality of the right ankle.   Original Report Authenticated By: Trudie Reed, M.D.   Ct Head Wo Contrast  04/01/2013   *RADIOLOGY REPORT*  Clinical Data: Trauma post fall 3 days ago with headache and neck pain as well as shortness of breath, nausea and dizziness.  CT HEAD WITHOUT CONTRAST  Technique:  Contiguous axial images were obtained from the base of the skull through the vertex without contrast.  Comparison: None.  Findings: Ventricles, cisterns and others CSF spaces are within normal.  There is no mass,  mass effect, shift midline structures or acute hemorrhage.  There is no evidence of acute infarction. Remaining bones and soft tissues are unremarkable.  IMPRESSION: No acute intracranial findings.   Original Report Authenticated By: Elberta Fortis, M.D.   Ct Angio Chest Pe W/cm &/or Wo Cm  04/01/2013   *RADIOLOGY REPORT*  Clinical Data: Shortness of breath and chest pain.  Abnormal chest x-ray.  CT ANGIOGRAPHY CHEST  Technique:  Multidetector CT imaging of the chest using the standard protocol during bolus administration of intravenous contrast. Multiplanar reconstructed images including MIPs were obtained and reviewed to evaluate the vascular anatomy.  Contrast: 80mL OMNIPAQUE IOHEXOL 300 MG/ML  SOLN  Comparison: No priors.  Findings:  Mediastinum: There are no filling defects within the pulmonary arterial tree to suggest underlying pulmonary embolism. Heart size is mildly enlarged. There is no significant pericardial fluid, thickening or pericardial calcification.  Numerous borderline enlarged and mildly enlarged mediastinal and right hilar lymph nodes are noted, with the largest node or a conglomeration of lymph nodes in the right hilar region measuring up to 2.1 x 1.4 cm. Small hiatal hernia.  Lungs/Pleura: There is diffuse bronchial wall thickening in the lungs bilaterally (right greater than left), and patchy areas of  profound thickening of the peribronchovascular interstitium throughout the right lung.  In addition, there are innumerable centrilobular ground-glass attenuation micro and micronodules throughout the right lung, with a small amount in the medial aspect of the left lower lobe as well.  In several areas within the right lung these nodules have formed confluent consolidative areas.  No pleural effusion.  No centrally obstructing endobronchial lesion is identified.  Upper Abdomen: Status post cholecystectomy.  Musculoskeletal: There are no aggressive appearing lytic or blastic lesions noted in the visualized portions of the skeleton.  IMPRESSION: 1.  No evidence of pulmonary embolism. 2.  Findings on the recent chest x-ray appear to represent a very severe multilobar bronchopneumonia in the right lung with some early endobronchial spread of infection into the medial aspect of the left lower lobe.  At this time, there is no definite central obstructing lesion identified. 3.  Borderline enlarged and mildly enlarged mediastinal and right hilar lymph nodes are presumably reactive. 4.  Mild cardiomegaly. 5.  Small hiatal hernia. 6.  Status post cholecystectomy.   Original Report Authenticated By: Trudie Reed, M.D.   Ct Cervical Spine Wo Contrast  04/01/2013   *RADIOLOGY REPORT*  Clinical Data: Post fall 3 days ago with neck pain.  CT CERVICAL SPINE WITHOUT CONTRAST  Technique:  Multidetector CT imaging of the cervical spine was performed. Multiplanar CT image reconstructions were also generated.  Comparison: None.  Findings: Vertebral body alignment, heights and disc spaces are within normal.  There is minimal spondylosis present.  Prevertebral soft tissues are within normal.  There is no evidence of compression fracture or subluxation.  Remainder of the exam is within normal.  IMPRESSION: No acute findings.   Original Report Authenticated By: Elberta Fortis, M.D.    Scheduled Meds: . albuterol  2.5 mg Nebulization  Q6H  . azithromycin  500 mg Intravenous Q24H  . budesonide (PULMICORT) nebulizer solution  0.25 mg Nebulization BID  . buPROPion  300 mg Oral Daily  . cefTRIAXone (ROCEPHIN)  IV  1 g Intravenous Q24H  . citalopram  40 mg Oral Daily  . enoxaparin (LOVENOX) injection  40 mg Subcutaneous Q24H  . famotidine  20 mg Oral BID  . fluticasone  2 spray Each Nare Daily  . ipratropium  0.5 mg Nebulization Q6H  . levothyroxine  125 mcg Oral QAC breakfast  . methylPREDNISolone (SOLU-MEDROL) injection  60 mg Intravenous Daily  . montelukast  10 mg Oral QHS  . morphine  30 mg Oral BID  . sodium chloride  3 mL Intravenous Q12H  . sodium chloride  3 mL Intravenous Q12H   Continuous Infusions:   Principal Problem:   Respiratory failure with hypercapnia Active Problems:   CAP (community acquired pneumonia)   Hypothyroidism    Time spent: 30 min    Kaelin Holford, [facility]  [facility] Pager [phone]. If 7PM-7AM, please contact night-coverage at [email or web address], password [facility] 04/03/2013, 8:36 AM  LOS: 2 days

## 2013-04-03 NOTE — Progress Notes (Signed)
Nurse called report to [facility] on new unit.  Pt was alert and oriented prior to transport without questions or concerns.  Pt belongings were carried with pt during transport.

## 2013-04-03 NOTE — Care Management Note (Signed)
    Page 1 of 1   04/03/2013     3:21:03 PM   CARE MANAGEMENT NOTE 04/03/2013  Patient:  Debra [name], Debra [name]   Account Number:  [id number]  Date Initiated:  04/03/2013  Documentation initiated by:  Alvira Philips Assessment:   42 yr-old female adm with dx of resp failure; lives with parents     In-house referral  Artist      DC Planning Services  CM consult  [facility] Program      Per UR Regulation:  Reviewed for med. necessity/level of care/duration of stay  Comments:  PCP:  Dr Barney Drain  04/03/13 [address] RN MSN BSN CCM Received referral for assistance with meds.  Per pt, her MCD termed and she has not re-applied, will not be able to purchase meds.  Pt thinks she may d/c this weekend, will notify weekend CM of potential need for [facility] program. Referral to financial counselor.

## 2013-04-04 DIAGNOSIS — F411 Generalized anxiety disorder: Secondary | ICD-10-CM

## 2013-04-04 DIAGNOSIS — J309 Allergic rhinitis, unspecified: Secondary | ICD-10-CM

## 2013-04-04 MED ORDER — ALBUTEROL SULFATE (2.5 MG/3ML) 0.083% IN NEBU: 2.5000 mg | INHALATION_SOLUTION | RESPIRATORY_TRACT | Status: DC | PRN

## 2013-04-04 MED ORDER — MORPHINE SULFATE ER 30 MG PO TBCR: 30.0000 mg | EXTENDED_RELEASE_TABLET | Freq: Two times a day (BID) | ORAL | Status: DC

## 2013-04-04 MED ORDER — IPRATROPIUM-ALBUTEROL 20-100 MCG/ACT IN AERS: 1.0000 | INHALATION_SPRAY | Freq: Four times a day (QID) | RESPIRATORY_TRACT | Status: DC | PRN

## 2013-04-04 MED ORDER — ALBUTEROL SULFATE HFA 108 (90 BASE) MCG/ACT IN AERS: 2.0000 | INHALATION_SPRAY | Freq: Four times a day (QID) | RESPIRATORY_TRACT | Status: DC | PRN

## 2013-04-04 MED ORDER — IPRATROPIUM BROMIDE 0.02 % IN SOLN: 500.0000 ug | Freq: Four times a day (QID) | RESPIRATORY_TRACT | Status: DC

## 2013-04-04 MED ORDER — PREDNISONE 10 MG PO TABS: ORAL_TABLET | ORAL | Status: DC

## 2013-04-04 MED ORDER — ALPRAZOLAM 0.25 MG PO TABS: 1.0000 mg | ORAL_TABLET | Freq: Two times a day (BID) | ORAL | Status: DC | PRN

## 2013-04-04 NOTE — Discharge Summary (Addendum)
Physician Discharge Summary  Debra [name] ZOX:[id number] DOB: 09-18-1970 DOA: 04/01/2013  PCP: Karle Plumber, MD  Admit date: 04/01/2013 Discharge date: 04/04/2013  Recommendations for Outpatient Follow-up:  1. Follow up with pulmonology within 2 weeks of discharge.  Recommend outpatient sleep study and PFTs.  Consider trying to transition from systemic steroids to other controlled medications if tolerated.   2. Primary care doctor as needed.  CBC for anemia evaluation if not already complete 3. Psychiatry for ongoing management of depression and anxiety.  Discharge Diagnoses:  Principal Problem:   Respiratory failure with hypercapnia Active Problems:   CAP (community acquired pneumonia)   Hypothyroidism   Discharge Condition: stable, improved  Diet recommendation: healthy heart  Wt Readings from Last 3 Encounters:  04/03/13 103.1 kg (227 lb 4.7 oz)  09/19/12 99.247 kg (218 lb 12.8 oz)  07/07/11 101.152 kg (223 lb)    History of present illness:  Debra [name] is a 42 y.o. female with history of asthma chronic pain and hypothyroidism presented to the ER because of shortness of breath. Patient states she's been Debra [name] of breath last 2 weeks which has been gradually worsening with nonproductive cough. Patient also has subjective feeling of fever chills. Denies any chest pain diaphoresis or any palpitations. In addition patient had a fall 3 days ago which patient states she came down some heaps of books. CT head was negative for any acute. Patient had CT angiography chest which shows pneumonic process. Since patient also was found to be mildly lethargic probably from her chronic pain and antianxiety medications and ABG was done which showed mild hypercarbia. Patient has been admitted for further management and placed on BiPAP. Antibiotics have been ordered he started for pneumonia.  Patient was admitted to the hospital was in March at [facility]. Patient was  admitted to [facility] in January of this year for pneumonia.  Hospital Course:   Acute hypoxic respiratory failure secondary to community acquired pneumonia and asthma exacerbation and sedation by narcotic and benzodiazepine medications.  She was admitted to the stepdown unit and placed on bipap which she continued for less than 24 hours and was able to quickly wean to nasal canula and subsequently to room air.  She was started on solumedrol which was tapered and transitioned to 60mg  daily of prednisone to start a long taper.  The patient states that she had been taking 20mg  daily of prednisone at home for the last several months.  She does not follow regularly with any pulmonologist.  She received duonebs and I will give her an Rx for duonebs and respimat inhaler for home.  She completed azithromycin 500mg  daily x 3 days and ceftriaxone during the day 3 days and she did not have fevers.  She did not have a leukocytosis which was likely due to high dose steroids.  She should continue her controller advair at discharge.    Allergic rhinitis, stable, continue flonase and singulair.  Depression/anxiety, uncontrolled.  Continue celexa and wellbutrin.  Xanax weaned to 1mg  twice daily as needed.   Follow up in 2 weeks with psychiatry.   Hypothyroidism, stable. Continue synthroid   Chronic pain with narcotic dependence with oversedation.  Patient amenable to discontinuing prn pain medication and using only her long acting.  Recommend outpatient weaning of her long acting narcotics as tolerated.  Discontinued trazodone altogether.    Chronic steroid use.  Continue calcium/vit D.  No other underlying immunodeficiency so will not start prophylactic bactrim  at this time.   Consultants:  None Procedures:  CTa chest Antibiotics:  Ceftriaxone 8/13 >> 8/16 Azithro 8/13 >> 8/16  Discharge Exam: Filed Vitals:   04/04/13 0531  BP: 103/63  Pulse: 66  Temp: 97.9 F (36.6 C)  Resp: 18   Filed  Vitals:   04/03/13 2045 04/03/13 2059 04/04/13 0531 04/04/13 0845  BP:  108/52 103/63   Pulse:  71 66   Temp:  97.8 F (36.6 C) 97.9 F (36.6 C)   TempSrc:  Oral Oral   Resp:  18 18   Height:  5\' 2"  (1.575 m)    Weight:  103.1 kg (227 lb 4.7 oz)    SpO2: 95% 96% 100% 100%    General: Awake, alert, obese CF, no acute distress HEENT: NCAT, MMM  Cardiovascular: RRR, nl S1, S2 no mrg, 2+ pulses, warm extremities  Respiratory: Markedly improved aeration, no obvious rales, rhonchi, or wheeze, no increased WOB  Abdomen: NABS, soft, NT/ND  MSK: Normal tone and bulk, no LEE  Neuro: Grossly intact   Discharge Instructions      Discharge Orders   Future Orders Complete By Expires   Call MD for:  difficulty breathing, headache or visual disturbances  As directed    Call MD for:  extreme fatigue  As directed    Call MD for:  hives  As directed    Call MD for:  persistant dizziness or light-headedness  As directed    Call MD for:  persistant nausea and vomiting  As directed    Call MD for:  severe uncontrolled pain  As directed    Call MD for:  temperature >100.4  As directed    Diet - low sodium heart healthy  As directed    Discharge instructions  As directed    Comments:     You were hospitalized with asthma exacerbation.  Please take prednisone in a long taper and follow up with pulmonology within 2 weeks of discharge.  Please use your nebulizer at least three times a day and you may use your albuterol inhaler as needed when out of the house.  Please reduce your xanax to twice a day as needed.  Stop using your as needed pain medication and use your long acting only.  Stop your trazodone.   Increase activity slowly  As directed        Medication List    STOP taking these medications       traZODone 100 MG tablet  Commonly known as:  DESYREL      TAKE these medications       albuterol (2.5 MG/3ML) 0.083% nebulizer solution  Commonly known as:  PROVENTIL  Take 3 mL (2.5 mg  total) by nebulization every 4 (four) hours as needed for wheezing or shortness of breath.     ALPRAZolam 0.25 MG tablet  Commonly known as:  XANAX  Take 4 tablets (1 mg total) by mouth 2 (two) times daily as needed for anxiety (2 times a day PRN anxiety).     buPROPion 300 MG 24 hr tablet  Commonly known as:  WELLBUTRIN XL  Take 300 mg by mouth daily.     citalopram 10 MG tablet  Commonly known as:  CELEXA  Take 40 mg by mouth daily.     fluticasone 50 MCG/ACT nasal spray  Commonly known as:  FLONASE  Place 2 sprays into the nose daily.     Fluticasone-Salmeterol 500-50 MCG/DOSE Aepb  Commonly known as:  ADVAIR  Inhale 1 puff into the lungs every 12 (twelve) hours.     ibuprofen 200 MG tablet  Commonly known as:  ADVIL,MOTRIN  Take 600 mg by mouth every 6 (six) hours as needed for pain.     ipratropium 0.02 % nebulizer solution  Commonly known as:  ATROVENT  Take 2.5 mL (500 mcg total) by nebulization 4 (four) times daily.     Ipratropium-Albuterol 20-100 MCG/ACT Aers respimat  Commonly known as:  COMBIVENT RESPIMAT  Inhale 1 puff into the lungs every 6 (six) hours as needed for wheezing.     levothyroxine 125 MCG tablet  Commonly known as:  SYNTHROID, LEVOTHROID  Take 125 mcg by mouth daily.     montelukast 10 MG tablet  Commonly known as:  SINGULAIR  Take 10 mg by mouth at bedtime.     morphine 30 MG 12 hr tablet  Commonly known as:  MS CONTIN  Take 1 tablet (30 mg total) by mouth 2 (two) times daily.     predniSONE 10 MG tablet  Commonly known as:  DELTASONE  Take 6 tabs daily x 5 days, 5 tabs daily x 5 days, 4 tabs daily x 5 days, 3 tabs daily x 5 days, 2 tabs daily x 5 days, then 10mg  daily thereafter     ranitidine 150 MG tablet  Commonly known as:  ZANTAC  Take 1 tablet (150 mg total) by mouth 2 (two) times daily.       Follow-up Information   Follow up with Karle Plumber, MD. Schedule an appointment as soon as possible for a visit in 2 weeks.    Specialty:  Internal Medicine   Contact information:   [address] [place] [address] [phone]       Follow up with [facility]. Schedule an appointment as soon as possible for a visit in 2 weeks.   Specialty:  Pulmonology   Contact information:   [address] [place] [address] [phone]      The results of significant diagnostics from this hospitalization (including imaging, microbiology, ancillary and laboratory) are listed below for reference.    Significant Diagnostic Studies: Dg Chest 2 View  04/01/2013   *RADIOLOGY REPORT*  Clinical Data: Shortness of breath.  Dyspnea.  CHEST - 2 VIEW  Comparison: Chest x-ray 09/18/2012.  Findings: Diffuse interstitial and patchy air space disease noted throughout the right lung, with some evidence of volume loss is demonstrated by slight rightward shift of cardiomediastinal structures.  Fullness in the right infrahilar region noted only on the frontal projection.  Left lung is clear.  No definite pleural effusions.  Heart size is within normal limits.  IMPRESSION: 1.  Highly unusual appearance of the thorax.  Findings may suggest post infectious or inflammatory scarring or post inflammatory process such as cryptogenic organizing pneumonia, particularly given the apparent right-sided pneumonia on chest x-ray 09/18/2012. However, there is soft tissue fullness in the right infrahilar region which could be indicative of an obstructing neoplasm or lymphadenopathy.  Clinical correlation is recommended, with consideration for further evaluation with contrast enhanced chest CT to better evaluate these findings.   Original Report Authenticated By: Trudie Reed, M.D.   Dg Forearm Right  04/01/2013   *RADIOLOGY REPORT*  Clinical Data: Right forearm pain.  History of fall.  RIGHT FOREARM - 2 VIEW  Comparison: No priors.  Findings: AP and lateral views of the right forearm demonstrate no acute displaced fractures.  Overlying soft  tissues are unremarkable.  IMPRESSION: 1.  No acute radiographic abnormality of the right radius or ulna.   Original Report Authenticated By: Trudie Reed, M.D.   Dg Ankle Complete Right  04/01/2013   *RADIOLOGY REPORT*  Clinical Data: Shortness of breath.  History of fall.  Ankle pain.  RIGHT ANKLE - COMPLETE 3+ VIEW  Comparison: No priors.  Findings: Three views of the right ankle demonstrate no definite acute displaced fracture, subluxation, dislocation, joint or soft tissue abnormality.  A small plantar calcaneal spur incidentally noted.  IMPRESSION: No acute radiographic abnormality of the right ankle.   Original Report Authenticated By: Trudie Reed, M.D.   Ct Head Wo Contrast  04/01/2013   *RADIOLOGY REPORT*  Clinical Data: Trauma post fall 3 days ago with headache and neck pain as well as shortness of breath, nausea and dizziness.  CT HEAD WITHOUT CONTRAST  Technique:  Contiguous axial images were obtained from the base of the skull through the vertex without contrast.  Comparison: None.  Findings: Ventricles, cisterns and others CSF spaces are within normal.  There is no mass, mass effect, shift midline structures or acute hemorrhage.  There is no evidence of acute infarction. Remaining bones and soft tissues are unremarkable.  IMPRESSION: No acute intracranial findings.   Original Report Authenticated By: Elberta Fortis, M.D.   Ct Angio Chest Pe W/cm &/or Wo Cm  04/01/2013   *RADIOLOGY REPORT*  Clinical Data: Shortness of breath and chest pain.  Abnormal chest x-ray.  CT ANGIOGRAPHY CHEST  Technique:  Multidetector CT imaging of the chest using the standard protocol during bolus administration of intravenous contrast. Multiplanar reconstructed images including MIPs were obtained and reviewed to evaluate the vascular anatomy.  Contrast: 80mL OMNIPAQUE IOHEXOL 300 MG/ML  SOLN  Comparison: No priors.  Findings:  Mediastinum: There are no filling defects within the pulmonary arterial tree to  suggest underlying pulmonary embolism. Heart size is mildly enlarged. There is no significant pericardial fluid, thickening or pericardial calcification.  Numerous borderline enlarged and mildly enlarged mediastinal and right hilar lymph nodes are noted, with the largest node or a conglomeration of lymph nodes in the right hilar region measuring up to 2.1 x 1.4 cm. Small hiatal hernia.  Lungs/Pleura: There is diffuse bronchial wall thickening in the lungs bilaterally (right greater than left), and patchy areas of profound thickening of the peribronchovascular interstitium throughout the right lung.  In addition, there are innumerable centrilobular ground-glass attenuation micro and micronodules throughout the right lung, with a small amount in the medial aspect of the left lower lobe as well.  In several areas within the right lung these nodules have formed confluent consolidative areas.  No pleural effusion.  No centrally obstructing endobronchial lesion is identified.  Upper Abdomen: Status post cholecystectomy.  Musculoskeletal: There are no aggressive appearing lytic or blastic lesions noted in the visualized portions of the skeleton.  IMPRESSION: 1.  No evidence of pulmonary embolism. 2.  Findings on the recent chest x-ray appear to represent a very severe multilobar bronchopneumonia in the right lung with some early endobronchial spread of infection into the medial aspect of the left lower lobe.  At this time, there is no definite central obstructing lesion identified. 3.  Borderline enlarged and mildly enlarged mediastinal and right hilar lymph nodes are presumably reactive. 4.  Mild cardiomegaly. 5.  Small hiatal hernia. 6.  Status post cholecystectomy.   Original Report Authenticated By: Trudie Reed, M.D.   Ct Cervical Spine Wo Contrast  04/01/2013   *  RADIOLOGY REPORT*  Clinical Data: Post fall 3 days ago with neck pain.  CT CERVICAL SPINE WITHOUT CONTRAST  Technique:  Multidetector CT imaging of the  cervical spine was performed. Multiplanar CT image reconstructions were also generated.  Comparison: None.  Findings: Vertebral body alignment, heights and disc spaces are within normal.  There is minimal spondylosis present.  Prevertebral soft tissues are within normal.  There is no evidence of compression fracture or subluxation.  Remainder of the exam is within normal.  IMPRESSION: No acute findings.   Original Report Authenticated By: Elberta Fortis, M.D.    Microbiology: Recent Results (from the past 240 hour(s))  MRSA PCR SCREENING     Status: None   Collection Time    04/02/13  6:50 AM      Result Value Range Status   MRSA by PCR NEGATIVE  NEGATIVE Final   Comment:            The GeneXpert MRSA Assay (FDA     approved for NASAL specimens     only), is one component of a     comprehensive MRSA colonization     surveillance program. It is not     intended to diagnose MRSA     infection nor to guide or     monitor treatment for     MRSA infections.     Labs: Basic Metabolic Panel:  Recent Labs Lab 04/01/13 2130 04/02/13 0530  NA 141 139  K 3.9 4.3  CL 103 101  CO2 31 27  GLUCOSE 96 123*  BUN 8 8  CREATININE 1.10 0.98  CALCIUM 9.0 8.9   Liver Function Tests:  Recent Labs Lab 04/01/13 2130 04/02/13 0530  AST 65* 35  ALT 38* 35  ALKPHOS 87 90  BILITOT 1.0 0.6  PROT 6.5 6.8  ALBUMIN 3.2* 3.3*   No results found for this basename: LIPASE, AMYLASE,  in the last 168 hours No results found for this basename: AMMONIA,  in the last 168 hours CBC:  Recent Labs Lab 04/01/13 2130 04/02/13 0530  WBC 16.8* 13.8*  NEUTROABS 13.2* 13.2*  HGB 10.2* 10.4*  HCT 34.5* 34.6*  MCV 88.0 85.6  PLT 264 249   Cardiac Enzymes:  Recent Labs Lab 04/01/13 2130  TROPONINI <0.30   BNP: BNP (last 3 results) No results found for this basename: PROBNP,  in the last 8760 hours CBG: No results found for this basename: GLUCAP,  in the last 168 hours  Time coordinating  discharge: 45 minutes  Signed:  Jani Ploeger  [facility] 04/04/2013, 11:17 AM

## 2013-04-04 NOTE — Progress Notes (Signed)
Patient discharge Home per Md order.  Discharge instructions reviewed with patient and family.  Copies of all forms given and explained. Patient/family voiced understanding of all instructions.  Discharge in no acute distress. 

## 2013-05-01 ENCOUNTER — Emergency Department (HOSPITAL_COMMUNITY): Payer: Medicaid Other

## 2013-05-01 ENCOUNTER — Emergency Department (HOSPITAL_COMMUNITY)
Admission: EM | Admit: 2013-05-01 | Discharge: 2013-05-01 | Disposition: A | Discharge status: Home / Self Care | Payer: Medicaid Other | Attending: Emergency Medicine | Admitting: Emergency Medicine

## 2013-05-01 ENCOUNTER — Encounter (HOSPITAL_COMMUNITY): Payer: Self-pay | Admitting: *Deleted

## 2013-05-01 DIAGNOSIS — Z880 Allergy status to penicillin: Secondary | ICD-10-CM | POA: Insufficient documentation

## 2013-05-01 DIAGNOSIS — R630 Anorexia: Secondary | ICD-10-CM | POA: Insufficient documentation

## 2013-05-01 DIAGNOSIS — M549 Dorsalgia, unspecified: Secondary | ICD-10-CM | POA: Insufficient documentation

## 2013-05-01 DIAGNOSIS — J189 Pneumonia, unspecified organism: Secondary | ICD-10-CM

## 2013-05-01 DIAGNOSIS — F411 Generalized anxiety disorder: Secondary | ICD-10-CM | POA: Insufficient documentation

## 2013-05-01 DIAGNOSIS — Z8719 Personal history of other diseases of the digestive system: Secondary | ICD-10-CM | POA: Insufficient documentation

## 2013-05-01 DIAGNOSIS — M129 Arthropathy, unspecified: Secondary | ICD-10-CM | POA: Insufficient documentation

## 2013-05-01 DIAGNOSIS — E079 Disorder of thyroid, unspecified: Secondary | ICD-10-CM | POA: Insufficient documentation

## 2013-05-01 DIAGNOSIS — J4489 Other specified chronic obstructive pulmonary disease: Secondary | ICD-10-CM | POA: Insufficient documentation

## 2013-05-01 DIAGNOSIS — J309 Allergic rhinitis, unspecified: Secondary | ICD-10-CM | POA: Insufficient documentation

## 2013-05-01 DIAGNOSIS — F3289 Other specified depressive episodes: Secondary | ICD-10-CM | POA: Insufficient documentation

## 2013-05-01 DIAGNOSIS — G8929 Other chronic pain: Secondary | ICD-10-CM | POA: Insufficient documentation

## 2013-05-01 DIAGNOSIS — Z87891 Personal history of nicotine dependence: Secondary | ICD-10-CM | POA: Insufficient documentation

## 2013-05-01 DIAGNOSIS — R509 Fever, unspecified: Secondary | ICD-10-CM | POA: Insufficient documentation

## 2013-05-01 DIAGNOSIS — R0602 Shortness of breath: Secondary | ICD-10-CM | POA: Insufficient documentation

## 2013-05-01 DIAGNOSIS — F329 Major depressive disorder, single episode, unspecified: Secondary | ICD-10-CM | POA: Insufficient documentation

## 2013-05-01 DIAGNOSIS — J449 Chronic obstructive pulmonary disease, unspecified: Secondary | ICD-10-CM | POA: Insufficient documentation

## 2013-05-01 DIAGNOSIS — J45909 Unspecified asthma, uncomplicated: Secondary | ICD-10-CM | POA: Insufficient documentation

## 2013-05-01 DIAGNOSIS — K219 Gastro-esophageal reflux disease without esophagitis: Secondary | ICD-10-CM | POA: Insufficient documentation

## 2013-05-01 DIAGNOSIS — Z79899 Other long term (current) drug therapy: Secondary | ICD-10-CM | POA: Insufficient documentation

## 2013-05-01 DIAGNOSIS — G4700 Insomnia, unspecified: Secondary | ICD-10-CM | POA: Insufficient documentation

## 2013-05-01 LAB — COMPREHENSIVE METABOLIC PANEL
Albumin: 3.1 g/dL — ABNORMAL LOW (ref 3.5–5.2)
BUN: 6 mg/dL (ref 6–23)
Calcium: 9 mg/dL (ref 8.4–10.5)
Creatinine, Ser: 0.96 mg/dL (ref 0.50–1.10)
GFR calc Af Amer: 83 mL/min — ABNORMAL LOW (ref >90)
Potassium: 3.3 mEq/L — ABNORMAL LOW (ref 3.5–5.1)
Total Protein: 6.4 g/dL (ref 6.0–8.3)

## 2013-05-01 LAB — CBC WITH DIFFERENTIAL/PLATELET
Basophils Relative: 0 % (ref 0–1)
Eosinophils Absolute: 0.4 10*3/uL (ref 0.0–0.7)
Eosinophils Relative: 6 % — ABNORMAL HIGH (ref 0–5)
Hemoglobin: 10.3 g/dL — ABNORMAL LOW (ref 12.0–15.0)
MCH: 25.7 pg — ABNORMAL LOW (ref 26.0–34.0)
MCHC: 31.5 g/dL (ref 30.0–36.0)
MCV: 81.5 fL (ref 78.0–100.0)
Monocytes Absolute: 0.5 10*3/uL (ref 0.1–1.0)
Monocytes Relative: 6 % (ref 3–12)
Neutrophils Relative %: 80 % — ABNORMAL HIGH (ref 43–77)

## 2013-05-01 LAB — URINALYSIS, ROUTINE W REFLEX MICROSCOPIC
Glucose, UA: NEGATIVE mg/dL
Hgb urine dipstick: NEGATIVE
Leukocytes, UA: NEGATIVE
Specific Gravity, Urine: 1.014 (ref 1.005–1.030)
Urobilinogen, UA: 0.2 mg/dL (ref 0.0–1.0)

## 2013-05-01 LAB — POCT I-STAT TROPONIN I

## 2013-05-01 LAB — CG4 I-STAT (LACTIC ACID): Lactic Acid, Venous: 2.64 mmol/L — ABNORMAL HIGH (ref 0.5–2.2)

## 2013-05-01 MED ORDER — FLUCONAZOLE 150 MG PO TABS: 150.0000 mg | ORAL_TABLET | Freq: Once | ORAL | Status: DC

## 2013-05-01 MED ORDER — ACETAMINOPHEN 325 MG PO TABS
650.0000 mg | ORAL_TABLET | Freq: Four times a day (QID) | ORAL | Status: DC | PRN
  Administered 2013-05-01: 650 mg via ORAL
  Filled 2013-05-01: qty 2

## 2013-05-01 MED ORDER — MORPHINE SULFATE 15 MG PO TABS: 15.0000 mg | ORAL_TABLET | ORAL | Status: DC | PRN

## 2013-05-01 MED ORDER — MORPHINE SULFATE ER 30 MG PO TBCR
30.0000 mg | EXTENDED_RELEASE_TABLET | Freq: Two times a day (BID) | ORAL | Status: DC
  Administered 2013-05-01: 30 mg via ORAL
  Filled 2013-05-01: qty 1

## 2013-05-01 MED ORDER — LEVOFLOXACIN 750 MG PO TABS
750.0000 mg | ORAL_TABLET | Freq: Once | ORAL | Status: AC
  Administered 2013-05-01: 750 mg via ORAL
  Filled 2013-05-01: qty 1

## 2013-05-01 MED ORDER — LEVOFLOXACIN 750 MG PO TABS: 750.0000 mg | ORAL_TABLET | Freq: Every day | ORAL | Status: DC

## 2013-05-01 NOTE — ED Notes (Signed)
Lactic acid results shown to Dr. Fonnie Jarvis

## 2013-05-01 NOTE — ED Notes (Addendum)
EMS called to pt home b/c of sob (sob is her norm, but she was vomiting).  Given 4 mg zofran (with relief) and 5 mg albuterol/0.5 atrovent (with relief).  Pt c/o back pain in between shoulder blades which increases with movement.  Pt would like me to know she takes a lot of pain meds on a daily basis.  Temp of 102.8.  D/c'd from [facility] 2 weeks prior for pnx.

## 2013-05-01 NOTE — ED Provider Notes (Signed)
CSN: [id number]     Arrival date & time 05/01/13  0731 History   First MD Initiated Contact with Patient 05/01/13 [phone]     Chief Complaint  Patient presents with  . Emesis  . Fever  . Back Pain   (Consider location/radiation/quality/duration/timing/severity/associated sxs/prior Treatment) HPI Comments: Debra [name] is a 42 y.o. female who is here for evaluation of shortness of breath, fever, and vomiting. The symptoms worsened this morning. She has had decreased appetite for 4 weeks. She has had intermittent episodes of vomiting in the last week. She is able to tolerate her usual meds, and took them this morning. She recently completed a prednisone taper as treatment for "double pneumonia". She states that she was discharged from the hospital without antibiotics several weeks ago. She saw her PCP, once, and had an x-ray that showed "scar tissue". She has had a nonproductive cough. She denies dysuria or urinary frequency. She feels like she has had double pneumonia since March 2014. There are no other known modifying factors.  Patient is a 42 y.o. female presenting with vomiting, fever, and back pain. The history is provided by the patient.  Emesis Fever Associated symptoms: vomiting   Back Pain Associated symptoms: fever     Past Medical History  Diagnosis Date  . Asthma   . Ulcerative colitis   . Anxiety   . Depression   . Chronic back pain   . Chronic knee pain   . Thyroid disease     hypo  . GERD (gastroesophageal reflux disease)   . Insomnia   . Allergic rhinitis   . Arthritis   . COPD (chronic obstructive pulmonary disease)    Past Surgical History  Procedure Laterality Date  . Knee surgery      x3, two on left and one on right.  Arthoscopy  . Cholecystectomy    . Abdominal hysterectomy    . Cesarean section    . Tonsillectomy    . Joint replacement      2x on left, 1x on right   Family History  Problem Relation Age of Onset  . High blood pressure Mother   .  Bipolar disorder Mother   . Asthma Father   . Parkinson's disease Father    History  Substance Use Topics  . Smoking status: Former Smoker -- 1.00 packs/day for 3 years    Types: Cigarettes    Quit date: 10/19/2011  . Smokeless tobacco: Never Used  . Alcohol Use: No   OB History   Grav Para Term Preterm Abortions TAB SAB Ect Mult Living                 Review of Systems  Constitutional: Positive for fever.  Gastrointestinal: Positive for vomiting.  Musculoskeletal: Positive for back pain.  All other systems reviewed and are negative.    Allergies  Penicillins  Home Medications   Current Outpatient Rx  Name  Route  Sig  Dispense  Refill  . albuterol (PROVENTIL HFA;VENTOLIN HFA) 108 (90 BASE) MCG/ACT inhaler   Inhalation   Inhale 2 puffs into the lungs every 6 (six) hours as needed for wheezing.         Marland Kitchen ALPRAZolam (XANAX) 1 MG tablet   Oral   Take 1 mg by mouth 3 (three) times daily as needed for anxiety.         Marland Kitchen buPROPion (WELLBUTRIN XL) 300 MG 24 hr tablet   Oral   Take 300 mg by mouth  daily.         . citalopram (CELEXA) 40 MG tablet   Oral   Take 40 mg by mouth daily.         . fluticasone (FLONASE) 50 MCG/ACT nasal spray   Nasal   Place 2 sprays into the nose daily as needed for rhinitis or allergies.         . Fluticasone-Salmeterol (ADVAIR) 500-50 MCG/DOSE AEPB   Inhalation   Inhale 1 puff into the lungs every 12 (twelve) hours.         . Ipratropium-Albuterol (COMBIVENT) 20-100 MCG/ACT AERS respimat   Inhalation   Inhale 2 puffs into the lungs 2 (two) times daily.         Marland Kitchen ipratropium-albuterol (DUONEB) 0.5-2.5 (3) MG/3ML SOLN   Nebulization   Take 3 mLs by nebulization every 6 (six) hours as needed (for wheezing).         Marland Kitchen levothyroxine (SYNTHROID, LEVOTHROID) 150 MCG tablet   Oral   Take 150 mcg by mouth daily before breakfast.         . montelukast (SINGULAIR) 10 MG tablet   Oral   Take 10 mg by mouth at bedtime.           Marland Kitchen morphine (MS CONTIN) 15 MG 12 hr tablet   Oral   Take 15 mg by mouth every 4 (four) hours as needed for pain.         Marland Kitchen morphine (MS CONTIN) 30 MG 12 hr tablet   Oral   Take 1 tablet (30 mg total) by mouth 2 (two) times daily.   60 tablet   0   . omeprazole (PRILOSEC) 40 MG capsule   Oral   Take 40 mg by mouth daily.         . promethazine (PHENERGAN) 25 MG suppository   Rectal   Place 25 mg rectally every 6 (six) hours as needed for nausea.         . traZODone (DESYREL) 100 MG tablet   Oral   Take 100 mg by mouth at bedtime.         . fluconazole (DIFLUCAN) 150 MG tablet   Oral   Take 1 tablet (150 mg total) by mouth once.   1 tablet   0   . levofloxacin (LEVAQUIN) 750 MG tablet   Oral   Take 1 tablet (750 mg total) by mouth daily. X 7 days   9 tablet   0    BP 100/40  Pulse 100  Temp(Src) 98.3 F (36.8 C) (Oral)  Resp 18  Ht 5\' 2"  (1.575 m)  Wt 217 lb (98.431 kg)  BMI 39.68 kg/m2  SpO2 96% Physical Exam  Nursing note and vitals reviewed. Constitutional: She is oriented to person, place, and time. She appears well-developed.  Obese  HENT:  Head: Normocephalic and atraumatic.  Eyes: Conjunctivae and EOM are normal. Pupils are equal, round, and reactive to light.  Neck: Normal range of motion and phonation normal. Neck supple.  Cardiovascular: Normal rate, regular rhythm and intact distal pulses.   Pulmonary/Chest: Effort normal and breath sounds normal. She exhibits no tenderness.  Somewhat decreased air movement bilaterally without wheezes, rales, or rhonchi  Abdominal: Soft. She exhibits no distension. There is no tenderness. There is no guarding.  Musculoskeletal: Normal range of motion.  Neurological: She is alert and oriented to person, place, and time. She has normal strength. She exhibits normal muscle tone.  Skin: Skin is warm and  dry.  Psychiatric: She has a normal mood and affect. Her behavior is normal. Judgment and thought  content normal.    ED Course  Procedures (including critical care time) 0830-after examination, she indicated that she was thirsty and wanted to have her regular "scheduled pain medicine".       Date: 05/01/13  Rate: 115  Rhythm: sinus tachycardia  QRS Axis: normal  PR and QT Intervals: QT prolonged  ST/T Wave abnormalities: normal  PR and QRS Conduction Disutrbances:Prolonged QT  Narrative Interpretation:   Old EKG Reviewed: changes noted- 09/18/12   Labs Review Labs Reviewed  CBC WITH DIFFERENTIAL - Abnormal; Notable for the following:    Hemoglobin 10.3 (*)    HCT 32.7 (*)    MCH 25.7 (*)    RDW 15.6 (*)    Neutrophils Relative % 80 (*)    Lymphocytes Relative 9 (*)    Lymphs Abs 0.6 (*)    Eosinophils Relative 6 (*)    All other components within normal limits  COMPREHENSIVE METABOLIC PANEL - Abnormal; Notable for the following:    Potassium 3.3 (*)    Albumin 3.1 (*)    GFR calc non Af Amer 71 (*)    GFR calc Af Amer 83 (*)    All other components within normal limits  CG4 I-STAT (LACTIC ACID) - Abnormal; Notable for the following:    Lactic Acid, Venous 2.64 (*)    All other components within normal limits  CULTURE, BLOOD (ROUTINE X 2)  CULTURE, BLOOD (ROUTINE X 2)  URINE CULTURE  URINALYSIS, ROUTINE W REFLEX MICROSCOPIC  LIPASE, BLOOD  POCT I-STAT TROPONIN I   Imaging Review Dg Chest 2 View  05/01/2013   *RADIOLOGY REPORT*  Clinical Data: Chest pain  CHEST - 2 VIEW  Comparison: 04/01/2013  Findings: There is been significant improvement and aeration in the right lung although some persistent right basilar infiltrate is seen.  Mild left basilar atelectasis is noted.  Cardiac shadow is stable.  IMPRESSION: Improved but persistent infiltrate on the right.   Original Report Authenticated By: Alcide Clever, M.D.    MDM   1. Community acquired pneumonia    She has a mild pneumonia. She is hemodynamically stable. Chest x-ray shows marked improvement from recent  CT scan. There certainly element of bronchitis, associated with this. She is stable for discharge without change in management, with the exception of oral antibiotic.   Nursing Notes Reviewed/ Care Coordinated, and agree without changes. Applicable Imaging Reviewed.  Interpretation of Laboratory Data incorporated into ED treatment   Plan: Home Medications- Levaquin; Home Treatments and Observation- watch for progressive symptoms; return here if the recommended treatment, does not improve the symptoms; Recommended follow up- PCP, checkup as needed. Follow up with pulmonologist next week as previously directed      Flint Melter, MD 05/01/13 1840

## 2013-05-02 LAB — URINE CULTURE

## 2013-05-07 LAB — CULTURE, BLOOD (ROUTINE X 2): Culture: NO GROWTH

## 2013-05-15 ENCOUNTER — Encounter (HOSPITAL_COMMUNITY): Payer: Self-pay | Admitting: Emergency Medicine

## 2013-05-15 ENCOUNTER — Emergency Department (HOSPITAL_COMMUNITY): Payer: Medicaid Other

## 2013-05-15 ENCOUNTER — Inpatient Hospital Stay (HOSPITAL_COMMUNITY)
Admission: EM | Admit: 2013-05-15 | Discharge: 2013-05-20 | DRG: 193 | Disposition: A | Discharge status: Home / Self Care | Payer: Medicaid Other | Attending: Internal Medicine | Admitting: Internal Medicine

## 2013-05-15 DIAGNOSIS — G4733 Obstructive sleep apnea (adult) (pediatric): Secondary | ICD-10-CM | POA: Diagnosis present

## 2013-05-15 DIAGNOSIS — E669 Obesity, unspecified: Secondary | ICD-10-CM | POA: Diagnosis present

## 2013-05-15 DIAGNOSIS — Y95 Nosocomial condition: Secondary | ICD-10-CM | POA: Diagnosis present

## 2013-05-15 DIAGNOSIS — J9600 Acute respiratory failure, unspecified whether with hypoxia or hypercapnia: Secondary | ICD-10-CM | POA: Diagnosis present

## 2013-05-15 DIAGNOSIS — D649 Anemia, unspecified: Secondary | ICD-10-CM | POA: Diagnosis present

## 2013-05-15 DIAGNOSIS — E662 Morbid (severe) obesity with alveolar hypoventilation: Secondary | ICD-10-CM | POA: Diagnosis present

## 2013-05-15 DIAGNOSIS — R0902 Hypoxemia: Secondary | ICD-10-CM

## 2013-05-15 DIAGNOSIS — R600 Localized edema: Secondary | ICD-10-CM | POA: Diagnosis present

## 2013-05-15 DIAGNOSIS — K219 Gastro-esophageal reflux disease without esophagitis: Secondary | ICD-10-CM | POA: Diagnosis present

## 2013-05-15 DIAGNOSIS — R609 Edema, unspecified: Secondary | ICD-10-CM | POA: Diagnosis present

## 2013-05-15 DIAGNOSIS — F3289 Other specified depressive episodes: Secondary | ICD-10-CM | POA: Diagnosis present

## 2013-05-15 DIAGNOSIS — J9692 Respiratory failure, unspecified with hypercapnia: Secondary | ICD-10-CM | POA: Diagnosis present

## 2013-05-15 DIAGNOSIS — E039 Hypothyroidism, unspecified: Secondary | ICD-10-CM | POA: Diagnosis present

## 2013-05-15 DIAGNOSIS — J449 Chronic obstructive pulmonary disease, unspecified: Secondary | ICD-10-CM | POA: Diagnosis present

## 2013-05-15 DIAGNOSIS — G4700 Insomnia, unspecified: Secondary | ICD-10-CM | POA: Diagnosis present

## 2013-05-15 DIAGNOSIS — Z87891 Personal history of nicotine dependence: Secondary | ICD-10-CM | POA: Clinically undetermined

## 2013-05-15 DIAGNOSIS — I2510 Atherosclerotic heart disease of native coronary artery without angina pectoris: Secondary | ICD-10-CM | POA: Diagnosis present

## 2013-05-15 DIAGNOSIS — F329 Major depressive disorder, single episode, unspecified: Secondary | ICD-10-CM | POA: Diagnosis present

## 2013-05-15 DIAGNOSIS — K5190 Ulcerative colitis, unspecified, without complications: Secondary | ICD-10-CM | POA: Diagnosis present

## 2013-05-15 DIAGNOSIS — G894 Chronic pain syndrome: Secondary | ICD-10-CM | POA: Diagnosis present

## 2013-05-15 DIAGNOSIS — J9601 Acute respiratory failure with hypoxia: Secondary | ICD-10-CM | POA: Diagnosis present

## 2013-05-15 DIAGNOSIS — D509 Iron deficiency anemia, unspecified: Secondary | ICD-10-CM | POA: Diagnosis present

## 2013-05-15 DIAGNOSIS — J189 Pneumonia, unspecified organism: Secondary | ICD-10-CM | POA: Diagnosis present

## 2013-05-15 LAB — URINALYSIS, ROUTINE W REFLEX MICROSCOPIC
Nitrite: NEGATIVE
Specific Gravity, Urine: 1.007 (ref 1.005–1.030)
pH: 5 (ref 5.0–8.0)

## 2013-05-15 LAB — BASIC METABOLIC PANEL
CO2: 26 mEq/L (ref 19–32)
Chloride: 103 mEq/L (ref 96–112)
GFR calc Af Amer: 74 mL/min — ABNORMAL LOW (ref >90)
Potassium: 3.9 mEq/L (ref 3.5–5.1)

## 2013-05-15 LAB — CBC WITH DIFFERENTIAL/PLATELET
Basophils Absolute: 0 10*3/uL (ref 0.0–0.1)
Basophils Relative: 0 % (ref 0–1)
Hemoglobin: 10.6 g/dL — ABNORMAL LOW (ref 12.0–15.0)
Lymphocytes Relative: 9 % — ABNORMAL LOW (ref 12–46)
MCHC: 30.6 g/dL (ref 30.0–36.0)
Neutro Abs: 9.8 10*3/uL — ABNORMAL HIGH (ref 1.7–7.7)
Neutrophils Relative %: 82 % — ABNORMAL HIGH (ref 43–77)
RDW: 15.8 % — ABNORMAL HIGH (ref 11.5–15.5)
WBC: 12 10*3/uL — ABNORMAL HIGH (ref 4.0–10.5)

## 2013-05-15 LAB — URINE MICROSCOPIC-ADD ON

## 2013-05-15 MED ORDER — LEVOTHYROXINE SODIUM 150 MCG PO TABS
150.0000 ug | ORAL_TABLET | Freq: Every day | ORAL | Status: DC
  Administered 2013-05-16 – 2013-05-20 (×5): 150 ug via ORAL
  Filled 2013-05-15 (×7): qty 1

## 2013-05-15 MED ORDER — MORPHINE SULFATE ER 15 MG PO TBCR
30.0000 mg | EXTENDED_RELEASE_TABLET | Freq: Two times a day (BID) | ORAL | Status: DC
  Administered 2013-05-16 – 2013-05-20 (×8): 30 mg via ORAL
  Filled 2013-05-15 (×4): qty 2
  Filled 2013-05-15: qty 1
  Filled 2013-05-15: qty 2
  Filled 2013-05-15: qty 1
  Filled 2013-05-15: qty 2

## 2013-05-15 MED ORDER — METHYLPREDNISOLONE SODIUM SUCC 125 MG IJ SOLR
125.0000 mg | Freq: Once | INTRAMUSCULAR | Status: AC
  Administered 2013-05-15: 125 mg via INTRAVENOUS
  Filled 2013-05-15: qty 2

## 2013-05-15 MED ORDER — LEVOFLOXACIN IN D5W 750 MG/150ML IV SOLN
750.0000 mg | INTRAVENOUS | Status: DC
  Administered 2013-05-15 – 2013-05-16 (×2): 750 mg via INTRAVENOUS
  Filled 2013-05-15 (×4): qty 150

## 2013-05-15 MED ORDER — ALPRAZOLAM 0.5 MG PO TABS
1.0000 mg | ORAL_TABLET | Freq: Three times a day (TID) | ORAL | Status: DC | PRN
  Administered 2013-05-16 – 2013-05-20 (×10): 1 mg via ORAL
  Filled 2013-05-15: qty 2
  Filled 2013-05-15: qty 4
  Filled 2013-05-15: qty 2
  Filled 2013-05-15 (×2): qty 4
  Filled 2013-05-15 (×5): qty 2

## 2013-05-15 MED ORDER — SODIUM CHLORIDE 0.9 % IV BOLUS (SEPSIS): 1000.0000 mL | Freq: Once | INTRAVENOUS | Status: DC

## 2013-05-15 MED ORDER — TRAZODONE HCL 100 MG PO TABS
100.0000 mg | ORAL_TABLET | Freq: Every day | ORAL | Status: DC
  Administered 2013-05-16 – 2013-05-19 (×5): 100 mg via ORAL
  Filled 2013-05-15 (×6): qty 1

## 2013-05-15 MED ORDER — BUPROPION HCL ER (XL) 300 MG PO TB24
300.0000 mg | ORAL_TABLET | Freq: Every day | ORAL | Status: DC
  Administered 2013-05-16 – 2013-05-20 (×5): 300 mg via ORAL
  Filled 2013-05-15 (×5): qty 1

## 2013-05-15 MED ORDER — PANTOPRAZOLE SODIUM 40 MG PO TBEC
40.0000 mg | DELAYED_RELEASE_TABLET | Freq: Every day | ORAL | Status: DC
  Administered 2013-05-17 – 2013-05-20 (×4): 40 mg via ORAL
  Filled 2013-05-15 (×5): qty 1

## 2013-05-15 MED ORDER — MORPHINE SULFATE 15 MG PO TABS
15.0000 mg | ORAL_TABLET | ORAL | Status: DC | PRN
  Administered 2013-05-15: 15 mg via ORAL
  Filled 2013-05-15: qty 1

## 2013-05-15 MED ORDER — VANCOMYCIN HCL IN DEXTROSE 1-5 GM/200ML-% IV SOLN
1000.0000 mg | Freq: Once | INTRAVENOUS | Status: AC
  Administered 2013-05-16: 1000 mg via INTRAVENOUS
  Filled 2013-05-15: qty 200

## 2013-05-15 MED ORDER — ALBUTEROL SULFATE (5 MG/ML) 0.5% IN NEBU
2.5000 mg | INHALATION_SOLUTION | Freq: Once | RESPIRATORY_TRACT | Status: AC
  Administered 2013-05-15: 2.5 mg via RESPIRATORY_TRACT
  Filled 2013-05-15: qty 0.5

## 2013-05-15 MED ORDER — MONTELUKAST SODIUM 10 MG PO TABS
10.0000 mg | ORAL_TABLET | Freq: Every day | ORAL | Status: DC
  Administered 2013-05-16 – 2013-05-19 (×5): 10 mg via ORAL
  Filled 2013-05-15 (×7): qty 1

## 2013-05-15 NOTE — ED Notes (Signed)
IV team called for IV start and blood draws.

## 2013-05-15 NOTE — ED Provider Notes (Signed)
CSN: [id number]     Arrival date & time 05/15/13  1948 History   First MD Initiated Contact with Patient 05/15/13 2000     Chief Complaint  Patient presents with  . Leg Swelling   (Consider location/radiation/quality/duration/timing/severity/associated sxs/prior Treatment) HPI Comments: This is a morbidly obese, chronically ill 42 year old woman, who comes in today with 7 days of peripheral lower extremity swelling, increased pain.  She has no previous history of heart failure, or peripheral vascular disease.  She has not contacted her primary care physician.  She states she had some leftover Lasix from "bloating, from her IBS" but only had to 20 mg tablets, and she took one pill last 2, days, without any decrease in the swelling of her legs.  She denies decreased urinary output or change in her respiratory status although this is normally difficult.  For her.  She has COPD and is not oxygen dependent, but has been using her father's oxygen when necessary.   The history is provided by the patient.    Past Medical History  Diagnosis Date  . Asthma   . Ulcerative colitis   . Anxiety   . Depression   . Chronic back pain   . Chronic knee pain   . Thyroid disease     hypo  . GERD (gastroesophageal reflux disease)   . Insomnia   . Allergic rhinitis   . Arthritis   . COPD (chronic obstructive pulmonary disease)    Past Surgical History  Procedure Laterality Date  . Knee surgery      x3, two on left and one on right.  Arthoscopy  . Cholecystectomy    . Abdominal hysterectomy    . Cesarean section    . Tonsillectomy    . Joint replacement      2x on left, 1x on right   Family History  Problem Relation Age of Onset  . High blood pressure Mother   . Bipolar disorder Mother   . Asthma Father   . Parkinson's disease Father    History  Substance Use Topics  . Smoking status: Former Smoker -- 1.00 packs/day for 3 years    Types: Cigarettes    Quit date: 10/19/2011  . Smokeless  tobacco: Never Used  . Alcohol Use: No   OB History   Grav Para Term Preterm Abortions TAB SAB Ect Mult Living                 Review of Systems  Constitutional: Negative for fever.  Respiratory: Positive for shortness of breath and wheezing.   Cardiovascular: Positive for leg swelling. Negative for chest pain and palpitations.  Gastrointestinal: Negative for nausea, abdominal pain, diarrhea and constipation.  Musculoskeletal: Positive for myalgias and arthralgias.  Neurological: Negative for dizziness and headaches.  All other systems reviewed and are negative.    Allergies  Penicillins  Home Medications   Current Outpatient Rx  Name  Route  Sig  Dispense  Refill  . albuterol (PROVENTIL HFA;VENTOLIN HFA) 108 (90 BASE) MCG/ACT inhaler   Inhalation   Inhale 2 puffs into the lungs every 6 (six) hours as needed for wheezing.         Marland Kitchen ALPRAZolam (XANAX) 1 MG tablet   Oral   Take 1 mg by mouth 3 (three) times daily as needed for anxiety.         Marland Kitchen buPROPion (WELLBUTRIN XL) 300 MG 24 hr tablet   Oral   Take 300 mg by mouth daily.         Marland Kitchen  citalopram (CELEXA) 40 MG tablet   Oral   Take 40 mg by mouth daily.         . fluticasone (FLONASE) 50 MCG/ACT nasal spray   Nasal   Place 2 sprays into the nose daily as needed for rhinitis or allergies.         . Fluticasone-Salmeterol (ADVAIR) 500-50 MCG/DOSE AEPB   Inhalation   Inhale 1 puff into the lungs every 12 (twelve) hours.         . Ipratropium-Albuterol (COMBIVENT) 20-100 MCG/ACT AERS respimat   Inhalation   Inhale 2 puffs into the lungs 2 (two) times daily.         Marland Kitchen ipratropium-albuterol (DUONEB) 0.5-2.5 (3) MG/3ML SOLN   Nebulization   Take 3 mLs by nebulization every 6 (six) hours as needed (for wheezing).         Marland Kitchen levothyroxine (SYNTHROID, LEVOTHROID) 150 MCG tablet   Oral   Take 150 mcg by mouth daily before breakfast.         . montelukast (SINGULAIR) 10 MG tablet   Oral   Take 10 mg  by mouth at bedtime.          Marland Kitchen morphine (MS CONTIN) 15 MG 12 hr tablet   Oral   Take 15 mg by mouth every 4 (four) hours as needed for pain.         Marland Kitchen morphine (MS CONTIN) 30 MG 12 hr tablet   Oral   Take 1 tablet (30 mg total) by mouth 2 (two) times daily.   60 tablet   0   . omeprazole (PRILOSEC) 40 MG capsule   Oral   Take 40 mg by mouth daily.         . traZODone (DESYREL) 100 MG tablet   Oral   Take 100 mg by mouth at bedtime.          BP 127/64  Pulse 98  Temp(Src) 97.8 F (36.6 C) (Oral)  Resp 12  SpO2 96% Physical Exam  Vitals reviewed. Constitutional: She is oriented to person, place, and time. She appears well-developed and well-nourished.  Obese  HENT:  Head: Normocephalic.  Eyes: Pupils are equal, round, and reactive to light.  Neck: Normal range of motion.  Cardiovascular: Normal rate and regular rhythm.   Pulmonary/Chest: No apnea and not tachypneic. No respiratory distress. She has wheezes. She has no rhonchi. She has no rales.  Abdominal:  Obese  Musculoskeletal: Normal range of motion. She exhibits edema and tenderness.  Pitting edema to the knee bilaterally  Neurological: She is alert and oriented to person, place, and time.  Skin: Skin is warm.    ED Course  Procedures (including critical care time) Labs Review Labs Reviewed  CBC WITH DIFFERENTIAL - Abnormal; Notable for the following:    WBC 12.0 (*)    Hemoglobin 10.6 (*)    HCT 34.6 (*)    MCH 25.1 (*)    RDW 15.8 (*)    Neutrophils Relative % 82 (*)    Neutro Abs 9.8 (*)    Lymphocytes Relative 9 (*)    All other components within normal limits  BASIC METABOLIC PANEL - Abnormal; Notable for the following:    GFR calc non Af Amer 64 (*)    GFR calc Af Amer 74 (*)    All other components within normal limits  URINALYSIS, ROUTINE W REFLEX MICROSCOPIC - Abnormal; Notable for the following:    APPearance CLOUDY (*)  Leukocytes, UA SMALL (*)    All other components within  normal limits  URINE MICROSCOPIC-ADD ON - Abnormal; Notable for the following:    Squamous Epithelial / LPF FEW (*)    Bacteria, UA FEW (*)    All other components within normal limits  URINE CULTURE  BLOOD GAS, ARTERIAL   Imaging Review Dg Chest 2 View  05/15/2013   CLINICAL DATA:  Shortness of breath with leg swelling.  EXAM: CHEST  2 VIEW  COMPARISON:  05/01/2013  FINDINGS: Midline trachea. Mild cardiomegaly, accentuated by a mildly low lung volumes. Mildly motion degraded lateral view. No pleural effusion or pneumothorax. Improvement in right base aeration. There is left base airspace disease which is new. Most apparent on the lateral view.  IMPRESSION: Resolution of right-sided airspace disease.  New left lower lobe airspace disease, most consistent with infection.   Electronically Signed   By: Jeronimo Greaves   On: 05/15/2013 21:41    MDM   1. Hospital-acquired pneumonia   2. Hypoxia   3. COPD (chronic obstructive pulmonary disease)   4. Peripheral edema    This patient has multiple medical comorbidities concern for CHF, versus renal insufficiency, labs and x-rays pending  X-ray reveals a new pneumonia, and still awaiting lab values, that I have started her on vancomycin and Levaquin.  Do, to her penicillin allergy for hospital acquired pneumonia.  Due to recent hospitalization.  I started her on steroids for her COPD and hypoxia.  She is requiring 3 L of oxygen at rest and maintain O2 sat at 90%. Patient admitted to MedSurg unit   Arman Filter, NP 05/16/13 0003  Medical screening examination/treatment/procedure(s) were conducted as a shared visit with non-physician practitioner(s) or resident  and myself.  I personally evaluated the patient during the encounter and agree with the findings and plan unless otherwise indicated.   Worsening sob, cough.  Hx of copd, pneumonia and recent hospitalization.  Rales on lung exam, lung swelling bilateral LE, supple neck/ no jvd, no distress.  CXR reviewed, pneumonia.  Discussed admission with pt.  Pt requiring [place] O2 in ED, feels improved.     HCAP, COPD exacerbation, Dyspnea   Enid Skeens, MD 05/16/13 [phone]

## 2013-05-15 NOTE — ED Notes (Signed)
Pt states she noticed her legs swelling about 5 days ago. Pt states the swelling has not decreased. Pt took 15mg  morphine prior to EMS arrival and states it did not work. Pt rates pain 10/10. Vitals 130/90, 90 p, 14 resp, 102 CBG.

## 2013-05-15 NOTE — ED Notes (Signed)
Pt states she has some bilateral leg swelling and pain that started 5 days ago. Pt states she ambulated legs and took morphine and it did not relieve her pain.

## 2013-05-16 ENCOUNTER — Inpatient Hospital Stay (HOSPITAL_COMMUNITY): Payer: Medicaid Other

## 2013-05-16 DIAGNOSIS — R609 Edema, unspecified: Secondary | ICD-10-CM

## 2013-05-16 DIAGNOSIS — J189 Pneumonia, unspecified organism: Secondary | ICD-10-CM | POA: Insufficient documentation

## 2013-05-16 DIAGNOSIS — E039 Hypothyroidism, unspecified: Secondary | ICD-10-CM

## 2013-05-16 DIAGNOSIS — D649 Anemia, unspecified: Secondary | ICD-10-CM

## 2013-05-16 DIAGNOSIS — R0902 Hypoxemia: Secondary | ICD-10-CM | POA: Diagnosis present

## 2013-05-16 DIAGNOSIS — J449 Chronic obstructive pulmonary disease, unspecified: Secondary | ICD-10-CM | POA: Diagnosis present

## 2013-05-16 DIAGNOSIS — K219 Gastro-esophageal reflux disease without esophagitis: Secondary | ICD-10-CM

## 2013-05-16 LAB — BASIC METABOLIC PANEL
BUN: 7 mg/dL (ref 6–23)
Calcium: 8.4 mg/dL (ref 8.4–10.5)
Chloride: 105 mEq/L (ref 96–112)
Creatinine, Ser: 1.01 mg/dL (ref 0.50–1.10)
GFR calc Af Amer: 78 mL/min — ABNORMAL LOW (ref >90)
GFR calc non Af Amer: 67 mL/min — ABNORMAL LOW (ref >90)
Glucose, Bld: 133 mg/dL — ABNORMAL HIGH (ref 70–99)
Sodium: 140 mEq/L (ref 135–145)

## 2013-05-16 LAB — BLOOD GAS, ARTERIAL
Acid-Base Excess: 2 mmol/L (ref 0.0–2.0)
Delivery systems: POSITIVE
Expiratory PAP: 5
FIO2: 0.5 %
Inspiratory PAP: 12
Patient temperature: 98.6
pH, Arterial: 7.37 (ref 7.350–7.450)
pO2, Arterial: 64.6 mmHg — ABNORMAL LOW (ref 80.0–100.0)

## 2013-05-16 LAB — CBC
HCT: 32.7 % — ABNORMAL LOW (ref 36.0–46.0)
Hemoglobin: 9.9 g/dL — ABNORMAL LOW (ref 12.0–15.0)
MCHC: 30.3 g/dL (ref 30.0–36.0)
Platelets: 244 10*3/uL (ref 150–400)
RDW: 15.7 % — ABNORMAL HIGH (ref 11.5–15.5)
WBC: 15.6 10*3/uL — ABNORMAL HIGH (ref 4.0–10.5)

## 2013-05-16 LAB — POCT I-STAT 3, ART BLOOD GAS (G3+)
O2 Saturation: 91 %
TCO2: 28 mmol/L (ref 0–100)
pCO2 arterial: 47.2 mmHg — ABNORMAL HIGH (ref 35.0–45.0)
pH, Arterial: 7.353 (ref 7.350–7.450)

## 2013-05-16 MED ORDER — ONDANSETRON HCL 4 MG PO TABS: 4.0000 mg | ORAL_TABLET | Freq: Four times a day (QID) | ORAL | Status: DC | PRN

## 2013-05-16 MED ORDER — ENOXAPARIN SODIUM 40 MG/0.4ML ~~LOC~~ SOLN
40.0000 mg | SUBCUTANEOUS | Status: DC
  Administered 2013-05-16 – 2013-05-19 (×4): 40 mg via SUBCUTANEOUS
  Filled 2013-05-16 (×5): qty 0.4

## 2013-05-16 MED ORDER — ACETAMINOPHEN 325 MG PO TABS: 650.0000 mg | ORAL_TABLET | Freq: Four times a day (QID) | ORAL | Status: DC | PRN

## 2013-05-16 MED ORDER — SODIUM CHLORIDE 0.9 % IV SOLN: INTRAVENOUS | Status: DC

## 2013-05-16 MED ORDER — HYDROMORPHONE HCL PF 1 MG/ML IJ SOLN
0.5000 mg | INTRAMUSCULAR | Status: DC | PRN
  Administered 2013-05-16: 1 mg via INTRAVENOUS
  Filled 2013-05-16: qty 1

## 2013-05-16 MED ORDER — CITALOPRAM HYDROBROMIDE 40 MG PO TABS
40.0000 mg | ORAL_TABLET | Freq: Every day | ORAL | Status: DC
  Administered 2013-05-16 – 2013-05-20 (×5): 40 mg via ORAL
  Filled 2013-05-16 (×5): qty 1

## 2013-05-16 MED ORDER — IPRATROPIUM-ALBUTEROL 20-100 MCG/ACT IN AERS
2.0000 | INHALATION_SPRAY | Freq: Two times a day (BID) | RESPIRATORY_TRACT | Status: DC
  Filled 2013-05-16: qty 4

## 2013-05-16 MED ORDER — FLUTICASONE PROPIONATE 50 MCG/ACT NA SUSP: 2.0000 | Freq: Every day | NASAL | Status: DC | PRN

## 2013-05-16 MED ORDER — ALBUTEROL SULFATE (5 MG/ML) 0.5% IN NEBU
2.5000 mg | INHALATION_SOLUTION | RESPIRATORY_TRACT | Status: DC | PRN
  Administered 2013-05-16 – 2013-05-20 (×3): 2.5 mg via RESPIRATORY_TRACT
  Filled 2013-05-16 (×4): qty 0.5

## 2013-05-16 MED ORDER — IPRATROPIUM BROMIDE 0.02 % IN SOLN
0.5000 mg | RESPIRATORY_TRACT | Status: DC
  Administered 2013-05-16 – 2013-05-18 (×15): 0.5 mg via RESPIRATORY_TRACT
  Filled 2013-05-16 (×15): qty 2.5

## 2013-05-16 MED ORDER — LEVOFLOXACIN IN D5W 500 MG/100ML IV SOLN: 500.0000 mg | INTRAVENOUS | Status: DC

## 2013-05-16 MED ORDER — VANCOMYCIN HCL IN DEXTROSE 1-5 GM/200ML-% IV SOLN
1000.0000 mg | Freq: Three times a day (TID) | INTRAVENOUS | Status: DC
  Administered 2013-05-16 – 2013-05-17 (×5): 1000 mg via INTRAVENOUS
  Filled 2013-05-16 (×7): qty 200

## 2013-05-16 MED ORDER — ALBUTEROL SULFATE (5 MG/ML) 0.5% IN NEBU
2.5000 mg | INHALATION_SOLUTION | RESPIRATORY_TRACT | Status: DC
  Administered 2013-05-16 – 2013-05-18 (×15): 2.5 mg via RESPIRATORY_TRACT
  Filled 2013-05-16 (×15): qty 0.5

## 2013-05-16 MED ORDER — ZOLPIDEM TARTRATE 5 MG PO TABS: 5.0000 mg | ORAL_TABLET | Freq: Every evening | ORAL | Status: DC | PRN

## 2013-05-16 MED ORDER — OXYCODONE HCL 5 MG PO TABS: 5.0000 mg | ORAL_TABLET | ORAL | Status: DC | PRN

## 2013-05-16 MED ORDER — ACETAMINOPHEN 650 MG RE SUPP: 650.0000 mg | Freq: Four times a day (QID) | RECTAL | Status: DC | PRN

## 2013-05-16 MED ORDER — ONDANSETRON HCL 4 MG/2ML IJ SOLN: 4.0000 mg | Freq: Four times a day (QID) | INTRAMUSCULAR | Status: DC | PRN

## 2013-05-16 MED ORDER — ALUM & MAG HYDROXIDE-SIMETH 200-200-20 MG/5ML PO SUSP: 30.0000 mL | Freq: Four times a day (QID) | ORAL | Status: DC | PRN

## 2013-05-16 NOTE — Progress Notes (Signed)
Patient ID: Debra [name], female   DOB: 01-20-1971, 42 y.o.   MRN: [id number] [facility] PROGRESS NOTE  Debra [name] WUJ:[id number] DOB: 1971/01/18 DOA: 05/15/2013 PCP: Karle Plumber, MD  Assessment/Plan:   Acute respiratory failure with hypoxia and hypercarbia:  Patient with history of COPD/asthma, probable OSA, obesity, narcotics.  Patient currently on the floor with non rebreather at 100%, sats in the 90%'s.  Decrease immediately off of mask, can not maintain with [place]. She is somnolent and does seem to have some obstruction of breathing.  Will transfer to SDU and start on BiPAP.  Check ABG one hour after starting.  Hold sedatives for now- continue with long acting morphine avoid PRNs, will leave alprazolam on med list to facilitate BiPAP if needed.  Otherwise avoid sedation.  She was advised to follow up with pulmonology after discharge at last admission in 03/2013, if does not improve rapidly may benefit from inpatient consultation. Hospital Acquired PNA:  Continue on levoquin and vancomycin. Peripheral edema: patient reports significant increase in edema over past few weeks. Check BNP. I do not see a recent ECHO, once respiratory status improves this may be useful.   Normocytic anemia: check anemia labs. Continue to monitor. No overt signs of blood loss. Likely chronic iron deficiency.   Hypothyroidism: TSH checked one month ago was normal. Check again today to be sure she has been taking medications as prescribed.  Code Status: FULL Family Communication: spoke with patient at bedside. Disposition Plan: transfer to SDU   Consultants:  none  Procedures:  Bipap  Antibiotics:  Vancomycin started 9/26 for HCAP  levoquin started 9/26 for HCAP  HPI/Subjective: Patient groggy.  Reports difficulty breathing and chronic back pain.  Objective: Filed Vitals:   05/16/13 0602  BP: 122/87  Pulse: 83  Temp: 98.8 F (37.1 C)  Resp: 20    Intake/Output Summary (Last 24  hours) at 05/16/13 1107 Last data filed at 05/16/13 0600  Gross per 24 hour  Intake    660 ml  Output      0 ml  Net    660 ml   Filed Weights   05/16/13 0017  Weight: 98.4 kg (216 lb 14.9 oz)    Exam:   General:  Groggy, responds appropriately to questions, then back to sleep, oriented  Cardiovascular: distant, regular no mrg  Respiratory: shallow resps, diffuse wheezes and crackles  Abdomen: soft, non tender, non distended, bs normal  Extremities:  2+ edema bilaterally, no cyanosis or discoloration, pulses 2+  Data Reviewed: Basic Metabolic Panel:  Recent Labs Lab 05/15/13 2235 05/16/13 0415  NA 141 140  K 3.9 3.7  CL 103 105  CO2 26 24  GLUCOSE 90 133*  BUN 7 7  CREATININE 1.05 1.01  CALCIUM 8.8 8.4   Liver Function Tests: No results found for this basename: AST, ALT, ALKPHOS, BILITOT, PROT, ALBUMIN,  in the last 168 hours No results found for this basename: LIPASE, AMYLASE,  in the last 168 hours No results found for this basename: AMMONIA,  in the last 168 hours CBC:  Recent Labs Lab 05/15/13 2235 05/16/13 0415  WBC 12.0* 15.6*  NEUTROABS 9.8*  --   HGB 10.6* 9.9*  HCT 34.6* 32.7*  MCV 82.0 82.0  PLT 229 244   Cardiac Enzymes: No results found for this basename: CKTOTAL, CKMB, CKMBINDEX, TROPONINI,  in the last 168 hours BNP (last 3 results) No results found for this basename: PROBNP,  in the last 8760 hours CBG:  No results found for this basename: GLUCAP,  in the last 168 hours  No results found for this or any previous visit (from the past 240 hour(s)).   Studies: Dg Chest 2 View  05/15/2013   CLINICAL DATA:  Shortness of breath with leg swelling.  EXAM: CHEST  2 VIEW  COMPARISON:  05/01/2013  FINDINGS: Midline trachea. Mild cardiomegaly, accentuated by a mildly low lung volumes. Mildly motion degraded lateral view. No pleural effusion or pneumothorax. Improvement in right base aeration. There is left base airspace disease which is new. Most  apparent on the lateral view.  IMPRESSION: Resolution of right-sided airspace disease.  New left lower lobe airspace disease, most consistent with infection.   Electronically Signed   By: Jeronimo Greaves   On: 05/15/2013 21:41    Scheduled Meds: . ipratropium  0.5 mg Nebulization Q4H   And  . albuterol  2.5 mg Nebulization Q4H  . buPROPion  300 mg Oral Daily  . citalopram  40 mg Oral Daily  . enoxaparin (LOVENOX) injection  40 mg Subcutaneous Q24H  . [START ON 05/17/2013] levofloxacin (LEVAQUIN) IV  500 mg Intravenous Q24H  . levofloxacin  750 mg Intravenous Q24H  . levothyroxine  150 mcg Oral QAC breakfast  . montelukast  10 mg Oral QHS  . morphine  30 mg Oral BID  . pantoprazole  40 mg Oral Daily  . traZODone  100 mg Oral QHS  . vancomycin  1,000 mg Intravenous Q8H   Continuous Infusions: . sodium chloride      Active Problems:   Acute respiratory failure with hypoxia   GERD (gastroesophageal reflux disease)   Normocytic anemia   Respiratory failure with hypercapnia   Hypothyroidism   Hospital-acquired pneumonia   COPD (chronic obstructive pulmonary disease)   Hypoxia   Peripheral edema    Time spent: 35 minutes    [facility]  [facility] Pager [phone]. If 7PM-7AM, please contact night-coverage at [email or web address], password [email or web address] 05/16/2013, 11:07 AM  LOS: 1 day

## 2013-05-16 NOTE — Progress Notes (Signed)
Patient on 100% non rebreather on the floor.  Unable to maintain sats on Nasal canula.  Needs continuous 02 monitoring and BIPAP.  Transfer to SDU.

## 2013-05-16 NOTE — Progress Notes (Signed)
Patient found with O2 sat 84% on 4L nasal cannula. Patient placed on 50% venturi mask.  Patient unable to clear heavy secretions in upper airway. Nasotracheal suctioning yielded copious amount of thin yellow-brown secretions. O2 sats improved to 88-91. Patient placed on 100% non-rebreather. O2 sat currently 94-97%. RN present.

## 2013-05-16 NOTE — H&P (Signed)
[facility] History and Physical  [name] AVW:[id number] DOB: Jun 23, 1971 DOA: 05/15/2013  Referring physician: EDP PCP: Karle Plumber, MD  Specialists:   Chief Complaint: SOB and Fever, and Swelling of Both legs  HPI: Debra [name] is a 42 y.o. female who presents to the ED with complaints of increased SOB along with fevers and chills and cough  For the past 2 weeks.  She came to the ED today due to swelling in both of her lower legs.   She reports having fever and chills with a temperature of 103.3 2 days ago.   She reports coughing up grayish sputum.   She has had pneumonia multiple times and was discharged to home after CAP on 09/08.  In the ED,  she was found to have a a new LLL pneumonia.   She was placed on IV Vancomycin and Levaquin and referred for admission.      Review of Systems: The patient denies anorexia, headaches, weight loss, vision loss, diplopia, dizziness, decreased hearing, rhinitis, hoarseness, chest pain, syncope, dyspnea on exertion, peripheral edema, balance deficits, cough, hemoptysis, abdominal pain, nausea, vomiting, diarrhea, constipation, hematemesis, melena, hematochezia, severe indigestion/heartburn, dysuria, hematuria, incontinence, muscle weakness, suspicious skin lesions, transient blindness, difficulty walking, depression, unusual weight change, abnormal bleeding, enlarged lymph nodes, angioedema, and breast masses.    Past Medical History  Diagnosis Date  . Asthma   . Ulcerative colitis   . Anxiety   . Depression   . Chronic back pain   . Chronic knee pain   . Thyroid disease     hypo  . GERD (gastroesophageal reflux disease)   . Insomnia   . Allergic rhinitis   . Arthritis   . COPD (chronic obstructive pulmonary disease)     Past Surgical History  Procedure Laterality Date  . Knee surgery      x3, two on left and one on right.  Arthoscopy  . Cholecystectomy    . Abdominal hysterectomy    . Cesarean section    .  Tonsillectomy    . Joint replacement      2x on left, 1x on right    Prior to Admission medications   Medication Sig Start Date End Date Taking? Authorizing Provider  albuterol (PROVENTIL HFA;VENTOLIN HFA) 108 (90 BASE) MCG/ACT inhaler Inhale 2 puffs into the lungs every 6 (six) hours as needed for wheezing.   Yes Historical Provider, MD  ALPRAZolam Prudy Feeler) 1 MG tablet Take 1 mg by mouth 3 (three) times daily as needed for anxiety.   Yes Historical Provider, MD  buPROPion (WELLBUTRIN XL) 300 MG 24 hr tablet Take 300 mg by mouth daily.   Yes Historical Provider, MD  citalopram (CELEXA) 40 MG tablet Take 40 mg by mouth daily.   Yes Historical Provider, MD  fluticasone (FLONASE) 50 MCG/ACT nasal spray Place 2 sprays into the nose daily as needed for rhinitis or allergies. 09/22/12  Yes Renae Fickle, MD  Fluticasone-Salmeterol (ADVAIR) 500-50 MCG/DOSE AEPB Inhale 1 puff into the lungs every 12 (twelve) hours.   Yes Historical Provider, MD  Ipratropium-Albuterol (COMBIVENT) 20-100 MCG/ACT AERS respimat Inhale 2 puffs into the lungs 2 (two) times daily. 04/04/13  Yes Renae Fickle, MD  ipratropium-albuterol (DUONEB) 0.5-2.5 (3) MG/3ML SOLN Take 3 mLs by nebulization every 6 (six) hours as needed (for wheezing).   Yes Historical Provider, MD  levothyroxine (SYNTHROID, LEVOTHROID) 150 MCG tablet Take 150 mcg by mouth daily before breakfast.   Yes Historical Provider, MD  montelukast (  SINGULAIR) 10 MG tablet Take 10 mg by mouth at bedtime.    Yes Historical Provider, MD  morphine (MS CONTIN) 15 MG 12 hr tablet Take 15 mg by mouth every 4 (four) hours as needed for pain.   Yes Historical Provider, MD  morphine (MS CONTIN) 30 MG 12 hr tablet Take 1 tablet (30 mg total) by mouth 2 (two) times daily. 04/04/13  Yes Renae Fickle, MD  omeprazole (PRILOSEC) 40 MG capsule Take 40 mg by mouth daily.   Yes Historical Provider, MD  traZODone (DESYREL) 100 MG tablet Take 100 mg by mouth at bedtime.   Yes  Historical Provider, MD    Allergies  Allergen Reactions  . Penicillins Swelling and Rash    Social History:  reports that she quit smoking about 18 months ago. Her smoking use included Cigarettes. She has a 3 pack-year smoking history. She has never used smokeless tobacco. She reports that she does not drink alcohol or use illicit drugs.     Family History  Problem Relation Age of Onset  . High blood pressure Mother   . Bipolar disorder Mother   . Asthma Father   . Parkinson's disease Father        CAD in Both Parents  Physical Exam:  GEN:  Pleasant  Obese  42 y.o. Caucasian female  examined  and in no acute distress; cooperative with exam Filed Vitals:   05/16/13 0100 05/16/13 0110 05/16/13 0134 05/16/13 0212  BP: 107/49  122/72   Pulse: 116  91   Temp:  99.8 F (37.7 C) 99 F (37.2 C)   TempSrc:   Oral   Resp:      Height:      Weight:      SpO2: 89%  91% 94%   Blood pressure 122/72, pulse 91, temperature 99 F (37.2 C), temperature source Oral, resp. rate 12, height 5\' 2"  (1.575 m), weight 98.4 kg (216 lb 14.9 oz), SpO2 94.00%. PSYCH: She is alert and oriented x4; does not appear anxious does not appear depressed; affect is normal HEENT: Normocephalic and Atraumatic, Mucous membranes pink; PERRLA; EOM intact; Fundi:  Benign;  No scleral icterus, Nares: Patent, Oropharynx: Clear, Fair Dentition, Neck:  FROM, no cervical lymphadenopathy nor thyromegaly or carotid bruit; no JVD; Breasts:: Not examined CHEST WALL: No tenderness CHEST: Normal respiration, clear to auscultation bilaterally HEART: Regular rate and rhythm; no murmurs rubs or gallops BACK: No kyphosis or scoliosis; no CVA tenderness ABDOMEN: Positive Bowel Sounds, Obese, soft non-tender; no masses, no organomegaly, no pannus; no intertriginous candida. Rectal Exam: Not done EXTREMITIES: No cyanosis, clubbing, 2+ EDEMA of BLEs. Genitalia: not examined PULSES: 2+ and symmetric SKIN: Normal hydration no rash  or ulceration CNS: Cranial nerves 2-12 grossly intact no focal neurologic deficit    Labs on Admission:  Basic Metabolic Panel:  Recent Labs Lab 05/15/13 2235  NA 141  K 3.9  CL 103  CO2 26  GLUCOSE 90  BUN 7  CREATININE 1.05  CALCIUM 8.8   Liver Function Tests: No results found for this basename: AST, ALT, ALKPHOS, BILITOT, PROT, ALBUMIN,  in the last 168 hours No results found for this basename: LIPASE, AMYLASE,  in the last 168 hours No results found for this basename: AMMONIA,  in the last 168 hours CBC:  Recent Labs Lab 05/15/13 2235  WBC 12.0*  NEUTROABS 9.8*  HGB 10.6*  HCT 34.6*  MCV 82.0  PLT 229   Cardiac Enzymes: No results found for  this basename: CKTOTAL, CKMB, CKMBINDEX, TROPONINI,  in the last 168 hours  BNP (last 3 results) No results found for this basename: PROBNP,  in the last 8760 hours CBG: No results found for this basename: GLUCAP,  in the last 168 hours  Radiological Exams on Admission: Dg Chest 2 View  05/15/2013   CLINICAL DATA:  Shortness of breath with leg swelling.  EXAM: CHEST  2 VIEW  COMPARISON:  05/01/2013  FINDINGS: Midline trachea. Mild cardiomegaly, accentuated by a mildly low lung volumes. Mildly motion degraded lateral view. No pleural effusion or pneumothorax. Improvement in right base aeration. There is left base airspace disease which is new. Most apparent on the lateral view.  IMPRESSION: Resolution of right-sided airspace disease.  New left lower lobe airspace disease, most consistent with infection.   Electronically Signed   By: Jeronimo Greaves   On: 05/15/2013 21:41     EKG: Independently reviewed.    Assessment/Plan Active Problems:   Hospital-acquired pneumonia   COPD (chronic obstructive pulmonary disease)   Hypoxia   Peripheral edema   GERD (gastroesophageal reflux disease)   Normocytic anemia   Hypothyroidism   Chronic Pain Syndrome    1.  HAP-   IV Vancomycin and IV Levaquin, and Nebulizer Rx and O2 PRN.      2.   COPD -  DuoNebs, O2.    3.   Hypoxia- O2 PRN monitor O2 levels.    4.   Hypothyroid- Continue  Levothyroxine, Check TSH Level.    5.   Normocytic Anemia-  Check Anemia panel.     6. Chronic Pain Syndrome- continue Pain Regimen.    7.  DVT Prophylaxis with Lovenox.        Code Status:    FULL CODE   Family Communication:    Mother at Bedside Disposition Plan:       Inpatient  Time spent:  28 Minutes  Ron Parker [facility] Pager [phone]  If 7PM-7AM, please contact night-coverage [email or web address] Password [email or web address] 05/16/2013, 3:01 AM

## 2013-05-16 NOTE — Progress Notes (Signed)
ANTIBIOTIC CONSULT NOTE - INITIAL  Pharmacy Consult for vancomycin Indication: rule out pneumonia  Allergies  Allergen Reactions  . Penicillins Swelling and Rash    Patient Measurements: Height: 5\' 2"  (157.5 cm) Weight: 216 lb 14.9 oz (98.4 kg) IBW/kg (Calculated) : 50.1  Vital Signs: Temp: 97.8 F (36.6 C) (09/26 2012) Temp src: Oral (09/26 2012) BP: 127/64 mmHg (09/26 2012) Pulse Rate: 98 (09/26 2012)  Labs:  Recent Labs  05/15/13 2235  WBC 12.0*  HGB 10.6*  PLT 229  CREATININE 1.05   Estimated Creatinine Clearance: 75.7 ml/min (by C-G formula based on Cr of 1.05).   Microbiology: Recent Results (from the past 720 hour(s))  CULTURE, BLOOD (ROUTINE X 2)     Status: None   Collection Time    05/01/13  7:55 AM      Result Value Range Status   Specimen Description BLOOD RIGHT HAND   Final   Special Requests BOTTLES DRAWN AEROBIC AND ANAEROBIC 10CC   Final   Culture  Setup Time     Final   Value: 05/01/2013 12:33     Performed at [facility]   Culture     Final   Value: NO GROWTH 5 DAYS     Performed at [facility]   Report Status 05/07/2013 FINAL   Final  CULTURE, BLOOD (ROUTINE X 2)     Status: None   Collection Time    05/01/13  8:05 AM      Result Value Range Status   Specimen Description BLOOD RIGHT ARM   Final   Special Requests BOTTLES DRAWN AEROBIC AND ANAEROBIC 10CC   Final   Culture  Setup Time     Final   Value: 05/01/2013 12:34     Performed at [facility]   Culture     Final   Value: NO GROWTH 5 DAYS     Performed at [facility]   Report Status 05/07/2013 FINAL   Final  URINE CULTURE     Status: None   Collection Time    05/01/13  9:22 AM      Result Value Range Status   Specimen Description URINE, CLEAN CATCH   Final   Special Requests NONE   Final   Culture  Setup Time     Final   Value: 05/01/2013 10:22     Performed at [facility] Count     Final   Value: 20,OOO COLONIES/ML   Performed at [facility]   Culture     Final   Value: Multiple bacterial morphotypes present, none predominant. Suggest appropriate recollection if clinically indicated.     Performed at [facility]   Report Status 05/02/2013 FINAL   Final    Medical History: Past Medical History  Diagnosis Date  . Asthma   . Ulcerative colitis   . Anxiety   . Depression   . Chronic back pain   . Chronic knee pain   . Thyroid disease     hypo  . GERD (gastroesophageal reflux disease)   . Insomnia   . Allergic rhinitis   . Arthritis   . COPD (chronic obstructive pulmonary disease)     Assessment: 42yo female c/o LE swelling x5d, noted w/ CXR c/w infection, to begin IV ABX.  Goal of Therapy:  Vancomycin trough level 15-20 mcg/ml  Plan:  Will begin vancomycin 1g IV Q8H and monitor heparin levels and CBC.  Vernard Gambles,  PharmD, BCPS  05/16/2013,1:01 AM

## 2013-05-17 LAB — BASIC METABOLIC PANEL
BUN: 11 mg/dL (ref 6–23)
Chloride: 103 mEq/L (ref 96–112)
Creatinine, Ser: 0.91 mg/dL (ref 0.50–1.10)
GFR calc Af Amer: 88 mL/min — ABNORMAL LOW (ref >90)
GFR calc non Af Amer: 76 mL/min — ABNORMAL LOW (ref >90)
Potassium: 3.4 mEq/L — ABNORMAL LOW (ref 3.5–5.1)

## 2013-05-17 LAB — RETICULOCYTES
RBC.: 3.54 MIL/uL — ABNORMAL LOW (ref 3.87–5.11)
Retic Count, Absolute: 49.6 10*3/uL (ref 19.0–186.0)

## 2013-05-17 LAB — CBC
MCHC: 31 g/dL (ref 30.0–36.0)
RBC: 3.54 MIL/uL — ABNORMAL LOW (ref 3.87–5.11)
RDW: 16 % — ABNORMAL HIGH (ref 11.5–15.5)

## 2013-05-17 LAB — URINE CULTURE

## 2013-05-17 LAB — FOLATE: Folate: 15.9 ng/mL

## 2013-05-17 LAB — MRSA PCR SCREENING: MRSA by PCR: NEGATIVE

## 2013-05-17 LAB — IRON AND TIBC: UIBC: 328 ug/dL (ref 125–400)

## 2013-05-17 LAB — VITAMIN B12: Vitamin B-12: 825 pg/mL (ref 211–911)

## 2013-05-17 NOTE — Progress Notes (Signed)
Patient ID: Debra [name], female   DOB: 10/05/1970, 42 y.o.   MRN: [id number] [facility] PROGRESS NOTE  DARCIE MELLONE [id number]:[id number] DOB: Nov 01, 1970 DOA: 05/15/2013 PCP: Karle Plumber, MD  Assessment/Plan:   Acute respiratory failure with hypoxia: Significantly improved with overnight BiPAP.  She is much more alert this morning and already seems to be transitioning easily to nasal canula.  Continue to hold sedatives as much as possible as I believe these are contributing to her hypoxia hypercarbia.  She will also need an OSA evaluation as an outpatient. HCAP: improving.  No fever, WBC's decreasing.  Currently on vanc and levoquin IV.  Will start oral levaquin and stop vanc. Peripheral edema: decreased today.   Normocytic anemia: labs are in progress Hypothyroidism: continue synthroid GERD: continue protonix Depression: continue welbutrin, celexa, xanax prn, trazodone   Code Status: FULL Family Communication: spoke with patient at bedside Disposition Plan: transfer to telemetry   Consultants:  none  Procedures:  none  Antibiotics:  vanc and IV levaquin 9/26>>9/28  Oral levaquin 9/28>>  HPI/Subjective: Doing well this morning. Much more alert and no respiratory distress.  Wants BiPAP off.  Objective: Filed Vitals:   05/17/13 0700  BP: 111/68  Pulse: 84  Temp: 97.9 F (36.6 C)  Resp: 15    Intake/Output Summary (Last 24 hours) at 05/17/13 0928 Last data filed at 05/17/13 0910  Gross per 24 hour  Intake    470 ml  Output   1175 ml  Net   -705 ml   Filed Weights   05/16/13 0017  Weight: 98.4 kg (216 lb 14.9 oz)    Exam:   General:  Alret, no distress  Cardiovascular: tachycardic, regular, no mrg  Respiratory:  No distress, bibasilar crackles, no wheezes  Abdomen: soft, non tender, non distended, bs normal  Musculoskeletal: +1 edema, no bruising,   Data Reviewed: Basic Metabolic Panel:  Recent Labs Lab 05/15/13 2235 05/16/13 0415  05/17/13 0540  NA 141 140 139  K 3.9 3.7 3.4*  CL 103 105 103  CO2 26 24 25   GLUCOSE 90 133* 89  BUN 7 7 11   CREATININE 1.05 1.01 0.91  CALCIUM 8.8 8.4 8.6   Liver Function Tests: No results found for this basename: AST, ALT, ALKPHOS, BILITOT, PROT, ALBUMIN,  in the last 168 hours No results found for this basename: LIPASE, AMYLASE,  in the last 168 hours No results found for this basename: AMMONIA,  in the last 168 hours CBC:  Recent Labs Lab 05/15/13 2235 05/16/13 0415 05/17/13 0540  WBC 12.0* 15.6* 8.4  NEUTROABS 9.8*  --   --   HGB 10.6* 9.9* 9.0*  HCT 34.6* 32.7* 29.0*  MCV 82.0 82.0 81.9  PLT 229 244 203   Cardiac Enzymes: No results found for this basename: CKTOTAL, CKMB, CKMBINDEX, TROPONINI,  in the last 168 hours BNP (last 3 results) No results found for this basename: PROBNP,  in the last 8760 hours CBG: No results found for this basename: GLUCAP,  in the last 168 hours  Recent Results (from the past 240 hour(s))  MRSA PCR SCREENING     Status: None   Collection Time    05/16/13 10:55 PM      Result Value Range Status   MRSA by PCR NEGATIVE  NEGATIVE Final   Comment:            The GeneXpert MRSA Assay (FDA     approved for NASAL specimens     only),  is one component of a     comprehensive MRSA colonization     surveillance program. It is not     intended to diagnose MRSA     infection nor to guide or     monitor treatment for     MRSA infections.     Studies: Dg Chest 2 View  05/15/2013   CLINICAL DATA:  Shortness of breath with leg swelling.  EXAM: CHEST  2 VIEW  COMPARISON:  05/01/2013  FINDINGS: Midline trachea. Mild cardiomegaly, accentuated by a mildly low lung volumes. Mildly motion degraded lateral view. No pleural effusion or pneumothorax. Improvement in right base aeration. There is left base airspace disease which is new. Most apparent on the lateral view.  IMPRESSION: Resolution of right-sided airspace disease.  New left lower lobe  airspace disease, most consistent with infection.   Electronically Signed   By: Jeronimo Greaves   On: 05/15/2013 21:41   Dg Chest Port 1 View  05/16/2013   CLINICAL DATA:  Pneumonia. Hypoxia.  EXAM: PORTABLE CHEST - 1 VIEW  COMPARISON:  05/15/2013  FINDINGS: Mild cardiomegaly.  Stable left base opacity since the previous day study suggesting pneumonia. This could be atelectasis. Milder opacity at the right lung base is likely atelectasis. No other lung opacities.  No pleural effusion or pneumothorax.  IMPRESSION: No change from the previous day's study.  Left basilar opacity suggests pneumonia, could be due to atelectasis. Mild medial lung base opacity is likely atelectasis.   Electronically Signed   By: Amie Portland   On: 05/16/2013 16:34    Scheduled Meds: . ipratropium  0.5 mg Nebulization Q4H   And  . albuterol  2.5 mg Nebulization Q4H  . buPROPion  300 mg Oral Daily  . citalopram  40 mg Oral Daily  . enoxaparin (LOVENOX) injection  40 mg Subcutaneous Q24H  . levofloxacin  750 mg Intravenous Q24H  . levothyroxine  150 mcg Oral QAC breakfast  . montelukast  10 mg Oral QHS  . morphine  30 mg Oral BID  . pantoprazole  40 mg Oral Daily  . traZODone  100 mg Oral QHS  . vancomycin  1,000 mg Intravenous Q8H   Continuous Infusions:   Active Problems:   Acute respiratory failure with hypoxia   GERD (gastroesophageal reflux disease)   Normocytic anemia   Respiratory failure with hypercapnia   Hypothyroidism   Hospital-acquired pneumonia   COPD (chronic obstructive pulmonary disease)   Hypoxia   Peripheral edema    Time spent: 45 minutes   [facility]  [facility] Pager [phone] If 7PM-7AM, please contact night-coverage at [email or web address], password [employer] 05/17/2013, 9:28 AM  LOS: 2 days

## 2013-05-18 ENCOUNTER — Inpatient Hospital Stay (HOSPITAL_COMMUNITY): Payer: Medicaid Other

## 2013-05-18 LAB — CBC
Hemoglobin: 8.7 g/dL — ABNORMAL LOW (ref 12.0–15.0)
MCHC: 30.6 g/dL (ref 30.0–36.0)
RBC: 3.49 MIL/uL — ABNORMAL LOW (ref 3.87–5.11)
RDW: 16 % — ABNORMAL HIGH (ref 11.5–15.5)

## 2013-05-18 LAB — BASIC METABOLIC PANEL
CO2: 28 mEq/L (ref 19–32)
GFR calc Af Amer: 88 mL/min — ABNORMAL LOW (ref >90)
GFR calc non Af Amer: 76 mL/min — ABNORMAL LOW (ref >90)
Glucose, Bld: 100 mg/dL — ABNORMAL HIGH (ref 70–99)
Potassium: 3.3 mEq/L — ABNORMAL LOW (ref 3.5–5.1)
Sodium: 140 mEq/L (ref 135–145)

## 2013-05-18 MED ORDER — IPRATROPIUM BROMIDE 0.02 % IN SOLN
0.5000 mg | Freq: Four times a day (QID) | RESPIRATORY_TRACT | Status: DC
  Administered 2013-05-18 – 2013-05-20 (×7): 0.5 mg via RESPIRATORY_TRACT
  Filled 2013-05-18 (×7): qty 2.5

## 2013-05-18 MED ORDER — OXYCODONE-ACETAMINOPHEN 5-325 MG PO TABS
1.0000 | ORAL_TABLET | Freq: Four times a day (QID) | ORAL | Status: DC | PRN
  Administered 2013-05-18 – 2013-05-20 (×5): 1 via ORAL
  Filled 2013-05-18 (×6): qty 1

## 2013-05-18 MED ORDER — FERROUS SULFATE 325 (65 FE) MG PO TABS
325.0000 mg | ORAL_TABLET | Freq: Two times a day (BID) | ORAL | Status: DC
  Administered 2013-05-18 – 2013-05-20 (×5): 325 mg via ORAL
  Filled 2013-05-18 (×7): qty 1

## 2013-05-18 MED ORDER — LEVOFLOXACIN 750 MG PO TABS
750.0000 mg | ORAL_TABLET | Freq: Every day | ORAL | Status: DC
  Administered 2013-05-18 – 2013-05-20 (×3): 750 mg via ORAL
  Filled 2013-05-18 (×3): qty 1

## 2013-05-18 MED ORDER — ALBUTEROL SULFATE (5 MG/ML) 0.5% IN NEBU
2.5000 mg | INHALATION_SOLUTION | Freq: Four times a day (QID) | RESPIRATORY_TRACT | Status: DC
  Administered 2013-05-18 – 2013-05-20 (×7): 2.5 mg via RESPIRATORY_TRACT
  Filled 2013-05-18 (×6): qty 0.5

## 2013-05-18 MED ORDER — POTASSIUM CHLORIDE CRYS ER 20 MEQ PO TBCR
40.0000 meq | EXTENDED_RELEASE_TABLET | Freq: Once | ORAL | Status: AC
  Administered 2013-05-18: 40 meq via ORAL
  Filled 2013-05-18: qty 2

## 2013-05-18 MED ORDER — KETOROLAC TROMETHAMINE 30 MG/ML IJ SOLN
30.0000 mg | Freq: Three times a day (TID) | INTRAMUSCULAR | Status: DC | PRN
  Administered 2013-05-19: 30 mg via INTRAVENOUS
  Filled 2013-05-18: qty 1

## 2013-05-18 NOTE — Progress Notes (Signed)
Utilization review completed.  

## 2013-05-18 NOTE — Progress Notes (Signed)
Patient complaining of new, severe, upper right back pain with inspiration. Characterized as "someone stabbing me in the back." Homan's sign negative bilaterally; legs are equally warm, equally mildly edematous with no reddened areas. O2 sat 92% on room air with slight increase in work of breathing reported by patient. Does appear anxious.  Gave oxycodone/APAP 5/235 PO for pain and paged NP-on-call for [facility]. Orders rec'd for chest x-ray, toradol 30mg  IV x 1 and K-pad for discomfort.  Will give medication when available and continue to monitor.

## 2013-05-18 NOTE — Progress Notes (Signed)
Patient ID: Debra [name], female   DOB: 01-Nov-1970, 42 y.o.   MRN: [id number] [facility] PROGRESS NOTE  Debra [name] [id number] DOB: Oct 17, 1970 DOA: 05/15/2013 PCP: Karle Plumber, MD  Assessment/Plan:   Acute respiratory failure with hypoxia: Improved. Doing well on nasal canula.  Continue to hold sedatives as much as possible as I believe these are contributing to her hypoxia hypercarbia.  She will also need an OSA evaluation as an outpatient.  She was to see pulmonology after last admission, but did not make it to follow up appointment.  HCAP: improving.  No fever, WBC's decreasing. On oral levaquin today.  Will need 10 day course, stop on 10/6   Peripheral edema: decreased today. Increase mobility should help.  Will check ECHO to be sure no signs of diastolic dysfunction. Normocytic anemia: iron deficient. Add suppliment. Hypothyroidism: continue synthroid GERD: continue protonix Depression: continue welbutrin, celexa, xanax prn, trazodone   Code Status: FULL Family Communication: spoke with patient at bedside Disposition Plan: ambulate, if does well will hope to dc in am.  Consultants:  none  Procedures:  none  Antibiotics:  vanc and IV levaquin 9/26>>9/28  Oral levaquin 9/28>> 10/6  HPI/Subjective: Doing well this morning. Alert, no distress.  Objective: Filed Vitals:   05/18/13 0831  BP: 121/79  Pulse: 88  Temp: 98.4 F (36.9 C)  Resp: 20    Intake/Output Summary (Last 24 hours) at 05/18/13 0855 Last data filed at 05/17/13 1957  Gross per 24 hour  Intake    120 ml  Output   1050 ml  Net   -930 ml   Filed Weights   05/16/13 0017  Weight: 98.4 kg (216 lb 14.9 oz)    Exam:   General:  Alret, no distress  Cardiovascular: rrr, no mrg  Respiratory:  No distress, bibasilar crackles, no wheezes  Abdomen: soft, non tender, non distended, bs normal  Musculoskeletal: +1 edema, no bruising,   Data Reviewed: Basic Metabolic  Panel:  Recent Labs Lab 05/15/13 2235 05/16/13 0415 05/17/13 0540  NA 141 140 139  K 3.9 3.7 3.4*  CL 103 105 103  CO2 26 24 25   GLUCOSE 90 133* 89  BUN 7 7 11   CREATININE 1.05 1.01 0.91  CALCIUM 8.8 8.4 8.6   Liver Function Tests: No results found for this basename: AST, ALT, ALKPHOS, BILITOT, PROT, ALBUMIN,  in the last 168 hours No results found for this basename: LIPASE, AMYLASE,  in the last 168 hours No results found for this basename: AMMONIA,  in the last 168 hours CBC:  Recent Labs Lab 05/15/13 2235 05/16/13 0415 05/17/13 0540  WBC 12.0* 15.6* 8.4  NEUTROABS 9.8*  --   --   HGB 10.6* 9.9* 9.0*  HCT 34.6* 32.7* 29.0*  MCV 82.0 82.0 81.9  PLT 229 244 203   Cardiac Enzymes: No results found for this basename: CKTOTAL, CKMB, CKMBINDEX, TROPONINI,  in the last 168 hours BNP (last 3 results) No results found for this basename: PROBNP,  in the last 8760 hours CBG: No results found for this basename: GLUCAP,  in the last 168 hours  Recent Results (from the past 240 hour(s))  URINE CULTURE     Status: None   Collection Time    05/15/13 11:31 PM      Result Value Range Status   Specimen Description URINE, RANDOM   Final   Special Requests CX ADDED AT 2357 ON [id number]   Final   Culture  Setup Time  Final   Value: 05/16/2013 06:18     Performed at [facility] Count     Final   Value: 85,000 COLONIES/ML     Performed at [facility]   Culture     Final   Value: Multiple bacterial morphotypes present, none predominant. Suggest appropriate recollection if clinically indicated.     Performed at [facility]   Report Status 05/17/2013 FINAL   Final  MRSA PCR SCREENING     Status: None   Collection Time    05/16/13 10:55 PM      Result Value Range Status   MRSA by PCR NEGATIVE  NEGATIVE Final   Comment:            The GeneXpert MRSA Assay (FDA     approved for NASAL specimens     only), is one component of a      comprehensive MRSA colonization     surveillance program. It is not     intended to diagnose MRSA     infection nor to guide or     monitor treatment for     MRSA infections.     Studies: Dg Chest Port 1 View  05/16/2013   CLINICAL DATA:  Pneumonia. Hypoxia.  EXAM: PORTABLE CHEST - 1 VIEW  COMPARISON:  05/15/2013  FINDINGS: Mild cardiomegaly.  Stable left base opacity since the previous day study suggesting pneumonia. This could be atelectasis. Milder opacity at the right lung base is likely atelectasis. No other lung opacities.  No pleural effusion or pneumothorax.  IMPRESSION: No change from the previous day's study.  Left basilar opacity suggests pneumonia, could be due to atelectasis. Mild medial lung base opacity is likely atelectasis.   Electronically Signed   By: Amie Portland   On: 05/16/2013 16:34    Scheduled Meds: . ipratropium  0.5 mg Nebulization Q4H   And  . albuterol  2.5 mg Nebulization Q4H  . buPROPion  300 mg Oral Daily  . citalopram  40 mg Oral Daily  . enoxaparin (LOVENOX) injection  40 mg Subcutaneous Q24H  . levofloxacin  750 mg Oral Daily  . levothyroxine  150 mcg Oral QAC breakfast  . montelukast  10 mg Oral QHS  . morphine  30 mg Oral BID  . pantoprazole  40 mg Oral Daily  . traZODone  100 mg Oral QHS   Continuous Infusions:   Active Problems:   Acute respiratory failure with hypoxia   GERD (gastroesophageal reflux disease)   Normocytic anemia   Respiratory failure with hypercapnia   Hypothyroidism   Hospital-acquired pneumonia   COPD (chronic obstructive pulmonary disease)   Hypoxia   Peripheral edema    Time spent: 45 minutes   [facility]  [facility] Pager [phone] If 7PM-7AM, please contact night-coverage at [email or web address], password [facility] 05/18/2013, 8:55 AM  LOS: 3 days

## 2013-05-18 NOTE — Progress Notes (Signed)
SATURATION QUALIFICATIONS: (This note is used to comply with regulatory documentation for home oxygen)  Patient Saturations on Room Air at Rest = 91%  Patient Saturations on Room Air while Ambulating = 89%  Pt never dropped below 89%

## 2013-05-19 DIAGNOSIS — I517 Cardiomegaly: Secondary | ICD-10-CM

## 2013-05-19 DIAGNOSIS — J9600 Acute respiratory failure, unspecified whether with hypoxia or hypercapnia: Secondary | ICD-10-CM

## 2013-05-19 LAB — BASIC METABOLIC PANEL
BUN: 6 mg/dL (ref 6–23)
Chloride: 104 mEq/L (ref 96–112)
GFR calc Af Amer: 88 mL/min — ABNORMAL LOW (ref >90)
Potassium: 3.4 mEq/L — ABNORMAL LOW (ref 3.5–5.1)
Sodium: 141 mEq/L (ref 135–145)

## 2013-05-19 LAB — CBC
HCT: 29 % — ABNORMAL LOW (ref 36.0–46.0)
Hemoglobin: 9.1 g/dL — ABNORMAL LOW (ref 12.0–15.0)
RBC: 3.6 MIL/uL — ABNORMAL LOW (ref 3.87–5.11)
WBC: 4.2 10*3/uL (ref 4.0–10.5)

## 2013-05-19 NOTE — Care Management Note (Unsigned)
    Page 1 of 1   05/19/2013     12:14:48 PM   CARE MANAGEMENT NOTE 05/19/2013  Patient:  Debra [name],Debra [name]   Account Number:  [id number]  Date Initiated:  05/19/2013  Documentation initiated by:  GRAVES-BIGELOW,Thorsten Climer  Subjective/Objective Assessment:   Pt admitted for SOB and Fever, and Swelling of Both legs. Pt states she lives with her mom now. She stated she had medicaid since her daughter was in the home. Daughter is now 86 and she is disabled.     Action/Plan:   CM did call the financial counselor-Maddie to see if she could assist pt with the issue. CM will continue to monitor for disposition needs.   Anticipated DC Date:  05/21/2013   Anticipated DC Plan:  HOME W HOME HEALTH SERVICES      DC Planning Services  CM consult      Choice offered to / List presented to:             Status of service:  In process, will continue to follow Medicare Important Message given?   (If response is "NO", the following Medicare IM given date fields will be blank) Date Medicare IM given:   Date Additional Medicare IM given:    Discharge Disposition:    Per UR Regulation:  Reviewed for med. necessity/level of care/duration of stay  If discussed at Long Length of Stay Meetings, dates discussed:    Comments:

## 2013-05-19 NOTE — Progress Notes (Signed)
*  PRELIMINARY RESULTS* Echocardiogram 2D Echocardiogram has been performed.  Debra [name] 05/19/2013, 4:29 PM

## 2013-05-19 NOTE — Progress Notes (Signed)
CSW (Clinical Child psychotherapist) left message with financial counselor about pt concerns about insurance.  Christabell Loseke, LCSWA [phone]

## 2013-05-19 NOTE — Progress Notes (Signed)
Patient ID: Debra [name], female   DOB: [birth year], 42 y.o.   MRN: [id number] [facility] PROGRESS NOTE  Debra [name] WUJ:[id number] DOB: June 20, 1971 DOA: 05/15/2013 PCP: Karle Plumber, MD  Assessment/Plan:   Acute respiratory failure with hypoxia: Resolved. Likely due to HCAP as well as narcotic sedation, obesity hypoventilation.  I do note that there is possible pulmonary edema on CXR over night as well as bilateral edema- which is improving.  Will get an ECHO today to be sure not missing systolic dysfunction.  Suspect there will be some diastolic dysfunction. HCAP: improving.  Doing well.  oral levaquin today.  Will need 10 day course, stop on 10/6   Peripheral edema: improving Normocytic anemia: iron deficient. continue suppliment. Hypothyroidism: continue synthroid GERD: continue protonix Depression: continue welbutrin, celexa, xanax prn, trazodone Dispo: will dc in am if echo is norma. Social work consult as she reports no insurance an assistance needed for payment/med access.   Code Status: FULL Family Communication: spoke with patient at bedside Disposition Plan: if echo normal plan to dc in am  Consultants:  none  Procedures:  none  Antibiotics:  vanc and IV levaquin 9/26>>9/28  Oral levaquin 9/28>> 10/6  HPI/Subjective: Doing well this morning. Alert, no distress.  Objective: Filed Vitals:   05/19/13 0512  BP: 110/58  Pulse: 76  Temp: 98.2 F (36.8 C)  Resp: 18   No intake or output data in the 24 hours ending 05/19/13 0939 Filed Weights   05/16/13 0017  Weight: 98.4 kg (216 lb 14.9 oz)    Exam:   General:  Alret, no distress  Cardiovascular: rrr, no mrg  Respiratory:  No distress, bibasilar crackles, no wheezes  Abdomen: soft, non tender, non distended, bs normal  Musculoskeletal: +1 edema, no bruising,   Data Reviewed: Basic Metabolic Panel:  Recent Labs Lab 05/15/13 2235 05/16/13 0415 05/17/13 0540 05/18/13 1000  05/19/13 0505  NA 141 140 139 140 141  K 3.9 3.7 3.4* 3.3* 3.4*  CL 103 105 103 103 104  CO2 26 24 25 28 27   GLUCOSE 90 133* 89 100* 88  BUN 7 7 11 7 6   CREATININE 1.05 1.01 0.91 0.91 0.91  CALCIUM 8.8 8.4 8.6 8.5 8.8   Liver Function Tests: No results found for this basename: AST, ALT, ALKPHOS, BILITOT, PROT, ALBUMIN,  in the last 168 hours No results found for this basename: LIPASE, AMYLASE,  in the last 168 hours No results found for this basename: AMMONIA,  in the last 168 hours CBC:  Recent Labs Lab 05/15/13 2235 05/16/13 0415 05/17/13 0540 05/18/13 1000 05/19/13 0505  WBC 12.0* 15.6* 8.4 5.0 4.2  NEUTROABS 9.8*  --   --   --   --   HGB 10.6* 9.9* 9.0* 8.7* 9.1*  HCT 34.6* 32.7* 29.0* 28.4* 29.0*  MCV 82.0 82.0 81.9 81.4 80.6  PLT 229 244 203 197 225   Cardiac Enzymes: No results found for this basename: CKTOTAL, CKMB, CKMBINDEX, TROPONINI,  in the last 168 hours BNP (last 3 results) No results found for this basename: PROBNP,  in the last 8760 hours CBG: No results found for this basename: GLUCAP,  in the last 168 hours  Recent Results (from the past 240 hour(s))  URINE CULTURE     Status: None   Collection Time    05/15/13 11:31 PM      Result Value Range Status   Specimen Description URINE, RANDOM   Final   Special Requests CX ADDED  AT 2357 ON 161096   Final   Culture  Setup Time     Final   Value: 05/16/2013 06:18     Performed at [facility]   Colony Count     Final   Value: 85,000 COLONIES/ML     Performed at [facility]   Culture     Final   Value: Multiple bacterial morphotypes present, none predominant. Suggest appropriate recollection if clinically indicated.     Performed at [facility]   Report Status 05/17/2013 FINAL   Final  MRSA PCR SCREENING     Status: None   Collection Time    05/16/13 10:55 PM      Result Value Range Status   MRSA by PCR NEGATIVE  NEGATIVE Final   Comment:            The GeneXpert MRSA  Assay (FDA     approved for NASAL specimens     only), is one component of a     comprehensive MRSA colonization     surveillance program. It is not     intended to diagnose MRSA     infection nor to guide or     monitor treatment for     MRSA infections.     Studies: Dg Chest Port 1 View  05/18/2013   CLINICAL DATA:  Right chest and back pain.  EXAM: PORTABLE CHEST - 1 VIEW  COMPARISON:  05/16/2013  FINDINGS: Diffuse interstitial prominence throughout the lungs, increased since prior study concerning for interstitial edema. Mild cardiomegaly. Bibasilar atelectasis. No visible effusions.  IMPRESSION: Increasing interstitial prominence, likely interstitial edema. Bibasilar atelectasis.   Electronically Signed   By: Charlett Nose M.D.   On: 05/18/2013 23:11    Scheduled Meds: . ipratropium  0.5 mg Nebulization Q6H   And  . albuterol  2.5 mg Nebulization Q6H  . buPROPion  300 mg Oral Daily  . citalopram  40 mg Oral Daily  . enoxaparin (LOVENOX) injection  40 mg Subcutaneous Q24H  . ferrous sulfate  325 mg Oral BID WC  . levofloxacin  750 mg Oral Daily  . levothyroxine  150 mcg Oral QAC breakfast  . montelukast  10 mg Oral QHS  . morphine  30 mg Oral BID  . pantoprazole  40 mg Oral Daily  . traZODone  100 mg Oral QHS   Continuous Infusions:   Active Problems:   Acute respiratory failure with hypoxia   GERD (gastroesophageal reflux disease)   Normocytic anemia   Respiratory failure with hypercapnia   Hypothyroidism   Hospital-acquired pneumonia   COPD (chronic obstructive pulmonary disease)   Hypoxia   Peripheral edema    Time spent: 45 minutes   [facility]  [facility] Pager [phone] If 7PM-7AM, please contact night-coverage at [email or web address], password [facility] 05/19/2013, 9:39 AM  LOS: 4 days

## 2013-05-20 DIAGNOSIS — F329 Major depressive disorder, single episode, unspecified: Secondary | ICD-10-CM

## 2013-05-20 MED ORDER — LEVOFLOXACIN 750 MG PO TABS: 750.0000 mg | ORAL_TABLET | Freq: Every day | ORAL | Status: DC

## 2013-05-20 MED ORDER — FERROUS SULFATE 325 (65 FE) MG PO TABS: 325.0000 mg | ORAL_TABLET | Freq: Two times a day (BID) | ORAL | Status: DC

## 2013-05-20 NOTE — Discharge Summary (Signed)
Physician Discharge Summary  Debra [name] [id number] DOB: [birth year] DOA: 05/15/2013  PCP: Karle Plumber, MD  Admit date: 05/15/2013 Discharge date: 05/20/2013  Time spent: 60 minutes  Recommendations for Outpatient Follow-up:  1. Follow up with PCP within next 2 weeks.  Patient reports sleep study negative for OSA but did exhibit hypoventilation in hospital and would benefit from sleep study and pft's.  Discharge Diagnoses:  Principal Problem:   Hospital-acquired pneumonia Active Problems:   Acute respiratory failure with hypoxia   GERD (gastroesophageal reflux disease)   Normocytic anemia   Respiratory failure with hypercapnia   Hypothyroidism     Peripheral edema  Discharge Condition: good  Diet recommendation: Heart Healthy  Filed Weights   05/16/13 0017  Weight: 98.4 kg (216 lb 14.9 oz)    History of present illness:  Debra [name] presented to the ED on 9/26 with shortness of breath, fevers, chills, grey sputum and LE swelling.  She had recently been discharged after admission for CAP on 04/27/13.  In ED she was found to have a new LLL pneumonia.  Hospital Course:  1) Hospital Acquired Pneumonia:  With recent hospital discharge 12 days ago she was treated for HAP.  She was initally started on Vancomycin and Levaquin.  She was transitioned to IV and then PO Levaquin.  She will need a total of 10 days treatment and will completed PO Levaquin on 10/6.   2) Acute respiratory failure with hypoxia and hypercarbia:  She was transferred to SDU for BiPAP from 9/26-9/27.  On presentation she was fairly sedated.  PCO2 elevated at 48.  Hypoventilation due to medications as well as obesity is suspected.  Sedating medications were avoided as much as possible during this admission and she was counseled regarding the risk of respiratory suppression.  She states that she has had a sleep study negative for OSA.  3) normocytic anemia: hgb 9.9 ferritin 64, iron 18, likely iron deficiency.   She was started on iron BID. 4) peripheral edema: significant dependant edema.  ECHO showed normal heart function with an EF of 65-70%.   5) hypothyrodism: no changes to home regimen. 6) GERD: no changes to home regimen 7) depression: no changes to home regimen 8) obesity: couseled regarding importance of weight loss    Procedures:  ECHO: 9/30: EF 65-70%.  No valvular abnormalities.    Consultations: None  Discharge Exam: Filed Vitals:   05/20/13 0700  BP: 122/72  Pulse: 81  Temp: 98.6 F (37 C)  Resp:     General: alert, comfortable Cardiovascular: RRR no mrg Respiratory: bibasilar crackles, good air movement EXT: 2+ edema bilaterally  Discharge Instructions  Discharge Orders   Future Orders Complete By Expires   Activity as tolerated - No restrictions  As directed    Call MD for:  difficulty breathing, headache or visual disturbances  As directed    Call MD for:  temperature >100.4  As directed    Diet - low sodium heart healthy  As directed    Discharge instructions  As directed    Comments:     Stop drinking soda. Start walking 15 minutes daily.       Medication List    TAKE these medications       albuterol 108 (90 BASE) MCG/ACT inhaler  Commonly known as:  PROVENTIL HFA;VENTOLIN HFA  Inhale 2 puffs into the lungs every 6 (six) hours as needed for wheezing.     ALPRAZolam 1 MG tablet  Commonly known as:  XANAX  Take 1 mg by mouth 3 (three) times daily as needed for anxiety.     buPROPion 300 MG 24 hr tablet  Commonly known as:  WELLBUTRIN XL  Take 300 mg by mouth daily.     citalopram 40 MG tablet  Commonly known as:  CELEXA  Take 40 mg by mouth daily.     ferrous sulfate 325 (65 FE) MG tablet  Take 1 tablet (325 mg total) by mouth 2 (two) times daily with a meal.     fluticasone 50 MCG/ACT nasal spray  Commonly known as:  FLONASE  Place 2 sprays into the nose daily as needed for rhinitis or allergies.     Ipratropium-Albuterol 20-100  MCG/ACT Aers respimat  Commonly known as:  COMBIVENT  Inhale 2 puffs into the lungs 2 (two) times daily.     ipratropium-albuterol 0.5-2.5 (3) MG/3ML Soln  Commonly known as:  DUONEB  Take 3 mLs by nebulization every 6 (six) hours as needed (for wheezing).     levofloxacin 750 MG tablet  Commonly known as:  LEVAQUIN  Take 1 tablet (750 mg total) by mouth daily.     levothyroxine 150 MCG tablet  Commonly known as:  SYNTHROID, LEVOTHROID  Take 150 mcg by mouth daily before breakfast.     montelukast 10 MG tablet  Commonly known as:  SINGULAIR  Take 10 mg by mouth at bedtime.     morphine 30 MG 12 hr tablet  Commonly known as:  Debra CONTIN  Take 1 tablet (30 mg total) by mouth 2 (two) times daily.     morphine 15 MG 12 hr tablet  Commonly known as:  Debra CONTIN  Take 15 mg by mouth every 4 (four) hours as needed for pain.     omeprazole 40 MG capsule  Commonly known as:  PRILOSEC  Take 40 mg by mouth daily.     traZODone 100 MG tablet  Commonly known as:  DESYREL  Take 100 mg by mouth at bedtime.      ASK your doctor about these medications       Fluticasone-Salmeterol 500-50 MCG/DOSE Aepb  Commonly known as:  ADVAIR  Inhale 1 puff into the lungs every 12 (twelve) hours.       Allergies  Allergen Reactions  . Penicillins Swelling and Rash       Follow-up Information   Follow up with Karle Plumber, MD.   Specialty:  Internal Medicine   Contact information:   [address] [place] [address] [phone]        The results of significant diagnostics from this hospitalization (including imaging, microbiology, ancillary and laboratory) are listed below for reference.    Significant Diagnostic Studies: Dg Chest 2 View  05/15/2013   CLINICAL DATA:  Shortness of breath with leg swelling.  EXAM: CHEST  2 VIEW  COMPARISON:  05/01/2013  FINDINGS: Midline trachea. Mild cardiomegaly, accentuated by a mildly low lung volumes. Mildly motion degraded lateral view.  No pleural effusion or pneumothorax. Improvement in right base aeration. There is left base airspace disease which is new. Most apparent on the lateral view.  IMPRESSION: Resolution of right-sided airspace disease.  New left lower lobe airspace disease, most consistent with infection.   Electronically Signed   By: Jeronimo Greaves   On: 05/15/2013 21:41   Dg Chest 2 View  05/01/2013   *RADIOLOGY REPORT*  Clinical Data: Chest pain  CHEST - 2 VIEW  Comparison: 04/01/2013  Findings:  There is been significant improvement and aeration in the right lung although some persistent right basilar infiltrate is seen.  Mild left basilar atelectasis is noted.  Cardiac shadow is stable.  IMPRESSION: Improved but persistent infiltrate on the right.   Original Report Authenticated By: Alcide Clever, M.D.   Dg Chest Port 1 View  05/18/2013   CLINICAL DATA:  Right chest and back pain.  EXAM: PORTABLE CHEST - 1 VIEW  COMPARISON:  05/16/2013  FINDINGS: Diffuse interstitial prominence throughout the lungs, increased since prior study concerning for interstitial edema. Mild cardiomegaly. Bibasilar atelectasis. No visible effusions.  IMPRESSION: Increasing interstitial prominence, likely interstitial edema. Bibasilar atelectasis.   Electronically Signed   By: Charlett Nose M.D.   On: 05/18/2013 23:11   Dg Chest Port 1 View  05/16/2013   CLINICAL DATA:  Pneumonia. Hypoxia.  EXAM: PORTABLE CHEST - 1 VIEW  COMPARISON:  05/15/2013  FINDINGS: Mild cardiomegaly.  Stable left base opacity since the previous day study suggesting pneumonia. This could be atelectasis. Milder opacity at the right lung base is likely atelectasis. No other lung opacities.  No pleural effusion or pneumothorax.  IMPRESSION: No change from the previous day's study.  Left basilar opacity suggests pneumonia, could be due to atelectasis. Mild medial lung base opacity is likely atelectasis.   Electronically Signed   By: Amie Portland   On: 05/16/2013 16:34     Microbiology: Recent Results (from the past 240 hour(s))  URINE CULTURE     Status: None   Collection Time    05/15/13 11:31 PM      Result Value Range Status   Specimen Description URINE, RANDOM   Final   Special Requests CX ADDED AT 2357 ON 161096   Final   Culture  Setup Time     Final   Value: 05/16/2013 06:18     Performed at [facility]   Colony Count     Final   Value: 85,000 COLONIES/ML     Performed at [facility]   Culture     Final   Value: Multiple bacterial morphotypes present, none predominant. Suggest appropriate recollection if clinically indicated.     Performed at [facility]   Report Status 05/17/2013 FINAL   Final  MRSA PCR SCREENING     Status: None   Collection Time    05/16/13 10:55 PM      Result Value Range Status   MRSA by PCR NEGATIVE  NEGATIVE Final   Comment:            The GeneXpert MRSA Assay (FDA     approved for NASAL specimens     only), is one component of a     comprehensive MRSA colonization     surveillance program. It is not     intended to diagnose MRSA     infection nor to guide or     monitor treatment for     MRSA infections.     Labs: Basic Metabolic Panel:  Recent Labs Lab 05/15/13 2235 05/16/13 0415 05/17/13 0540 05/18/13 1000 05/19/13 0505  NA 141 140 139 140 141  K 3.9 3.7 3.4* 3.3* 3.4*  CL 103 105 103 103 104  CO2 26 24 25 28 27   GLUCOSE 90 133* 89 100* 88  BUN 7 7 11 7 6   CREATININE 1.05 1.01 0.91 0.91 0.91  CALCIUM 8.8 8.4 8.6 8.5 8.8   Liver Function Tests: No results found for this basename: AST,  ALT, ALKPHOS, BILITOT, PROT, ALBUMIN,  in the last 168 hours No results found for this basename: LIPASE, AMYLASE,  in the last 168 hours No results found for this basename: AMMONIA,  in the last 168 hours CBC:  Recent Labs Lab 05/15/13 2235 05/16/13 0415 05/17/13 0540 05/18/13 1000 05/19/13 0505  WBC 12.0* 15.6* 8.4 5.0 4.2  NEUTROABS 9.8*  --   --   --   --   HGB 10.6*  9.9* 9.0* 8.7* 9.1*  HCT 34.6* 32.7* 29.0* 28.4* 29.0*  MCV 82.0 82.0 81.9 81.4 80.6  PLT 229 244 203 197 225   Cardiac Enzymes: No results found for this basename: CKTOTAL, CKMB, CKMBINDEX, TROPONINI,  in the last 168 hours BNP: BNP (last 3 results) No results found for this basename: PROBNP,  in the last 8760 hours CBG: No results found for this basename: GLUCAP,  in the last 168 hours     Signed:  Ceylon Arenson  [facility] 05/20/2013, 10:46 AM

## 2013-05-20 NOTE — Care Management (Signed)
Case Manager did call [facility] in [place] to see how much Rx's would be and both total 33.00. Pt states her mom will be able to help with the cost of medications. No further needs from CM at this time. Gala Lewandowsky, RN,BSN [phone]

## 2013-05-20 NOTE — Progress Notes (Signed)
D/c orders received;IV removed with gauze on, pt remains in stable condition, pt meds and instructions reviewed and given to pt; pt d/c to home 

## 2013-05-30 ENCOUNTER — Encounter (HOSPITAL_BASED_OUTPATIENT_CLINIC_OR_DEPARTMENT_OTHER): Payer: Self-pay | Admitting: Emergency Medicine

## 2013-05-30 ENCOUNTER — Emergency Department (HOSPITAL_BASED_OUTPATIENT_CLINIC_OR_DEPARTMENT_OTHER): Payer: Medicaid Other

## 2013-05-30 ENCOUNTER — Emergency Department (HOSPITAL_BASED_OUTPATIENT_CLINIC_OR_DEPARTMENT_OTHER)
Admission: EM | Admit: 2013-05-30 | Discharge: 2013-05-30 | Disposition: A | Discharge status: Home / Self Care | Payer: Medicaid Other | Attending: Emergency Medicine | Admitting: Emergency Medicine

## 2013-05-30 DIAGNOSIS — F3289 Other specified depressive episodes: Secondary | ICD-10-CM | POA: Insufficient documentation

## 2013-05-30 DIAGNOSIS — G8929 Other chronic pain: Secondary | ICD-10-CM | POA: Insufficient documentation

## 2013-05-30 DIAGNOSIS — Z79899 Other long term (current) drug therapy: Secondary | ICD-10-CM | POA: Insufficient documentation

## 2013-05-30 DIAGNOSIS — E039 Hypothyroidism, unspecified: Secondary | ICD-10-CM | POA: Insufficient documentation

## 2013-05-30 DIAGNOSIS — J441 Chronic obstructive pulmonary disease with (acute) exacerbation: Secondary | ICD-10-CM | POA: Insufficient documentation

## 2013-05-30 DIAGNOSIS — G4700 Insomnia, unspecified: Secondary | ICD-10-CM | POA: Insufficient documentation

## 2013-05-30 DIAGNOSIS — Z9889 Other specified postprocedural states: Secondary | ICD-10-CM | POA: Insufficient documentation

## 2013-05-30 DIAGNOSIS — F411 Generalized anxiety disorder: Secondary | ICD-10-CM | POA: Insufficient documentation

## 2013-05-30 DIAGNOSIS — L500 Allergic urticaria: Secondary | ICD-10-CM | POA: Insufficient documentation

## 2013-05-30 DIAGNOSIS — L509 Urticaria, unspecified: Secondary | ICD-10-CM

## 2013-05-30 DIAGNOSIS — IMO0002 Reserved for concepts with insufficient information to code with codable children: Secondary | ICD-10-CM | POA: Insufficient documentation

## 2013-05-30 DIAGNOSIS — K219 Gastro-esophageal reflux disease without esophagitis: Secondary | ICD-10-CM | POA: Insufficient documentation

## 2013-05-30 DIAGNOSIS — J449 Chronic obstructive pulmonary disease, unspecified: Secondary | ICD-10-CM

## 2013-05-30 DIAGNOSIS — Z87891 Personal history of nicotine dependence: Secondary | ICD-10-CM | POA: Insufficient documentation

## 2013-05-30 DIAGNOSIS — M129 Arthropathy, unspecified: Secondary | ICD-10-CM | POA: Insufficient documentation

## 2013-05-30 DIAGNOSIS — F329 Major depressive disorder, single episode, unspecified: Secondary | ICD-10-CM | POA: Insufficient documentation

## 2013-05-30 DIAGNOSIS — Z880 Allergy status to penicillin: Secondary | ICD-10-CM | POA: Insufficient documentation

## 2013-05-30 MED ORDER — FAMOTIDINE IN NACL 20-0.9 MG/50ML-% IV SOLN
20.0000 mg | Freq: Once | INTRAVENOUS | Status: AC
  Administered 2013-05-30: 20 mg via INTRAVENOUS
  Filled 2013-05-30: qty 50

## 2013-05-30 MED ORDER — PREDNISONE 50 MG PO TABS: ORAL_TABLET | ORAL | Status: DC

## 2013-05-30 MED ORDER — ALBUTEROL SULFATE (5 MG/ML) 0.5% IN NEBU
5.0000 mg | INHALATION_SOLUTION | Freq: Once | RESPIRATORY_TRACT | Status: AC
  Administered 2013-05-30: 5 mg via RESPIRATORY_TRACT
  Filled 2013-05-30: qty 1

## 2013-05-30 MED ORDER — KETOROLAC TROMETHAMINE 30 MG/ML IJ SOLN
30.0000 mg | Freq: Once | INTRAMUSCULAR | Status: AC
  Administered 2013-05-30: 30 mg via INTRAVENOUS
  Filled 2013-05-30: qty 1

## 2013-05-30 MED ORDER — METHYLPREDNISOLONE SODIUM SUCC 125 MG IJ SOLR
125.0000 mg | Freq: Once | INTRAMUSCULAR | Status: AC
  Administered 2013-05-30: 125 mg via INTRAVENOUS
  Filled 2013-05-30: qty 2

## 2013-05-30 MED ORDER — PREDNISONE 10 MG PO TABS: 50.0000 mg | ORAL_TABLET | Freq: Every day | ORAL | Status: DC

## 2013-05-30 MED ORDER — DIPHENHYDRAMINE HCL 50 MG/ML IJ SOLN
12.5000 mg | Freq: Once | INTRAMUSCULAR | Status: AC
  Administered 2013-05-30: 12.5 mg via INTRAVENOUS
  Filled 2013-05-30: qty 1

## 2013-05-30 MED ORDER — IPRATROPIUM BROMIDE 0.02 % IN SOLN
0.5000 mg | Freq: Once | RESPIRATORY_TRACT | Status: AC
  Administered 2013-05-30: 0.5 mg via RESPIRATORY_TRACT
  Filled 2013-05-30: qty 2.5

## 2013-05-30 MED ORDER — FAMOTIDINE 20 MG PO TABS: 20.0000 mg | ORAL_TABLET | Freq: Two times a day (BID) | ORAL | Status: DC

## 2013-05-30 NOTE — ED Notes (Signed)
Pt given rx x 2 for prednisone and pepcid- d/c home with ride

## 2013-05-30 NOTE — ED Provider Notes (Signed)
CSN: [id number]     Arrival date & time 05/30/13  0409 History   First MD Initiated Contact with Patient 05/30/13 0429     Chief Complaint  Patient presents with  . Leg Pain   (Consider location/radiation/quality/duration/timing/severity/associated sxs/prior Treatment) Patient is a 42 y.o. female presenting with rash.  Rash Location:  Leg Leg rash location:  L hip and R lower leg Quality: itchiness   Quality: not blistering   Severity:  Mild Onset quality:  Gradual Timing:  Constant Progression:  Unchanged Chronicity:  New Context: not nuts   Relieved by:  Nothing Worsened by:  Nothing tried Ineffective treatments:  None tried Associated symptoms: no abdominal pain, no throat swelling and no tongue swelling   Seen by PMD on the second and they were reportedly there at that time but PMD was managing her chronic pain and peripheral edema and she states was not concerned with the itchy skin lesions.  Has chronic pain and edema in the legs  Past Medical History  Diagnosis Date  . Asthma   . Ulcerative colitis   . Anxiety   . Depression   . Chronic back pain   . Chronic knee pain   . Thyroid disease     hypo  . GERD (gastroesophageal reflux disease)   . Insomnia   . Allergic rhinitis   . Arthritis   . COPD (chronic obstructive pulmonary disease)    Past Surgical History  Procedure Laterality Date  . Knee surgery      x3, two on left and one on right.  Arthoscopy  . Cholecystectomy    . Abdominal hysterectomy    . Cesarean section    . Tonsillectomy    . Joint replacement      2x on left, 1x on right   Family History  Problem Relation Age of Onset  . High blood pressure Mother   . Bipolar disorder Mother   . Asthma Father   . Parkinson's disease Father    History  Substance Use Topics  . Smoking status: Former Smoker -- 1.00 packs/day for 3 years    Types: Cigarettes    Quit date: 10/19/2011  . Smokeless tobacco: Never Used  . Alcohol Use: No   OB  History   Grav Para Term Preterm Abortions TAB SAB Ect Mult Living                 Review of Systems  Gastrointestinal: Negative for abdominal pain.  Skin: Positive for rash.  All other systems reviewed and are negative.    Allergies  Penicillins  Home Medications   Current Outpatient Rx  Name  Route  Sig  Dispense  Refill  . albuterol (PROVENTIL HFA;VENTOLIN HFA) 108 (90 BASE) MCG/ACT inhaler   Inhalation   Inhale 2 puffs into the lungs every 6 (six) hours as needed for wheezing.         Marland Kitchen ALPRAZolam (XANAX) 1 MG tablet   Oral   Take 1 mg by mouth 3 (three) times daily as needed for anxiety.         Marland Kitchen buPROPion (WELLBUTRIN XL) 300 MG 24 hr tablet   Oral   Take 300 mg by mouth daily.         . citalopram (CELEXA) 40 MG tablet   Oral   Take 40 mg by mouth daily.         . fluticasone (FLONASE) 50 MCG/ACT nasal spray   Nasal   Place 2 sprays  into the nose daily as needed for rhinitis or allergies.         . Fluticasone-Salmeterol (ADVAIR) 500-50 MCG/DOSE AEPB   Inhalation   Inhale 1 puff into the lungs every 12 (twelve) hours.         . Ipratropium-Albuterol (COMBIVENT) 20-100 MCG/ACT AERS respimat   Inhalation   Inhale 2 puffs into the lungs 2 (two) times daily.         Marland Kitchen ipratropium-albuterol (DUONEB) 0.5-2.5 (3) MG/3ML SOLN   Nebulization   Take 3 mLs by nebulization every 6 (six) hours as needed (for wheezing).         Marland Kitchen levothyroxine (SYNTHROID, LEVOTHROID) 150 MCG tablet   Oral   Take 150 mcg by mouth daily before breakfast.         . montelukast (SINGULAIR) 10 MG tablet   Oral   Take 10 mg by mouth at bedtime.          Marland Kitchen morphine (MS CONTIN) 15 MG 12 hr tablet   Oral   Take 15 mg by mouth every 4 (four) hours as needed for pain.         Marland Kitchen morphine (MS CONTIN) 30 MG 12 hr tablet   Oral   Take 1 tablet (30 mg total) by mouth 2 (two) times daily.   60 tablet   0   . omeprazole (PRILOSEC) 40 MG capsule   Oral   Take 40 mg  by mouth daily.         . ferrous sulfate 325 (65 FE) MG tablet   Oral   Take 1 tablet (325 mg total) by mouth 2 (two) times daily with a meal.   60 tablet   3   . levofloxacin (LEVAQUIN) 750 MG tablet   Oral   Take 1 tablet (750 mg total) by mouth daily.   6 tablet   0   . traZODone (DESYREL) 100 MG tablet   Oral   Take 100 mg by mouth at bedtime.          BP 133/84  Pulse 92  Temp(Src) 98.9 F (37.2 C) (Oral)  Resp 20  Wt 231 lb (104.781 kg)  BMI 42.24 kg/m2  SpO2 93% Physical Exam  Constitutional: She appears well-developed and well-nourished. No distress.  Appears sedated from medication  HENT:  Head: Normocephalic and atraumatic.  Mouth/Throat: No oropharyngeal exudate.  Eyes: Conjunctivae and EOM are normal.  Pinpoint pupils  Neck: Normal range of motion. Neck supple.  Cardiovascular: Normal rate, regular rhythm and intact distal pulses.   Pulmonary/Chest: Effort normal. No stridor. She has wheezes.  Abdominal: Soft. Bowel sounds are normal. There is no tenderness. There is no rebound and no guarding.  Skin: Skin is warm and dry. Rash noted.       ED Course  Procedures (including critical care time) Labs Review Labs Reviewed - No data to display Imaging Review No results found.  EKG Interpretation   None       MDM  No diagnosis found. Will treat for allergic reaction, no signs of CHF leg pain is chronic and patient will need to be seen by her PMD for same. Patient is already sedated on her oral medication    Shavar Gorka [facility] [place], MD 05/30/13 [phone]

## 2013-05-30 NOTE — ED Notes (Signed)
Per EMS pt states she has had BLE swelling/pain for 2 weeks. States recently treated for PNA. States she took her last Morphine pill @ 2100 10/10

## 2013-05-30 NOTE — ED Notes (Signed)
Patient transported to X-ray 

## 2013-06-15 ENCOUNTER — Encounter (HOSPITAL_COMMUNITY): Payer: Self-pay | Admitting: Emergency Medicine

## 2013-06-15 ENCOUNTER — Emergency Department (HOSPITAL_COMMUNITY)
Admission: EM | Admit: 2013-06-15 | Discharge: 2013-06-15 | Disposition: A | Discharge status: Home / Self Care | Payer: Medicaid Other | Attending: Emergency Medicine | Admitting: Emergency Medicine

## 2013-06-15 DIAGNOSIS — E039 Hypothyroidism, unspecified: Secondary | ICD-10-CM | POA: Insufficient documentation

## 2013-06-15 DIAGNOSIS — F411 Generalized anxiety disorder: Secondary | ICD-10-CM | POA: Insufficient documentation

## 2013-06-15 DIAGNOSIS — M549 Dorsalgia, unspecified: Secondary | ICD-10-CM | POA: Insufficient documentation

## 2013-06-15 DIAGNOSIS — F3289 Other specified depressive episodes: Secondary | ICD-10-CM | POA: Insufficient documentation

## 2013-06-15 DIAGNOSIS — G8929 Other chronic pain: Secondary | ICD-10-CM | POA: Insufficient documentation

## 2013-06-15 DIAGNOSIS — J449 Chronic obstructive pulmonary disease, unspecified: Secondary | ICD-10-CM | POA: Insufficient documentation

## 2013-06-15 DIAGNOSIS — Z79899 Other long term (current) drug therapy: Secondary | ICD-10-CM | POA: Insufficient documentation

## 2013-06-15 DIAGNOSIS — Z880 Allergy status to penicillin: Secondary | ICD-10-CM | POA: Insufficient documentation

## 2013-06-15 DIAGNOSIS — M25569 Pain in unspecified knee: Secondary | ICD-10-CM | POA: Insufficient documentation

## 2013-06-15 DIAGNOSIS — R109 Unspecified abdominal pain: Secondary | ICD-10-CM

## 2013-06-15 DIAGNOSIS — IMO0002 Reserved for concepts with insufficient information to code with codable children: Secondary | ICD-10-CM | POA: Insufficient documentation

## 2013-06-15 DIAGNOSIS — Z87891 Personal history of nicotine dependence: Secondary | ICD-10-CM | POA: Insufficient documentation

## 2013-06-15 DIAGNOSIS — G4700 Insomnia, unspecified: Secondary | ICD-10-CM | POA: Insufficient documentation

## 2013-06-15 DIAGNOSIS — R112 Nausea with vomiting, unspecified: Secondary | ICD-10-CM | POA: Insufficient documentation

## 2013-06-15 DIAGNOSIS — J4489 Other specified chronic obstructive pulmonary disease: Secondary | ICD-10-CM | POA: Insufficient documentation

## 2013-06-15 DIAGNOSIS — R1084 Generalized abdominal pain: Secondary | ICD-10-CM | POA: Insufficient documentation

## 2013-06-15 DIAGNOSIS — R197 Diarrhea, unspecified: Secondary | ICD-10-CM | POA: Insufficient documentation

## 2013-06-15 DIAGNOSIS — F329 Major depressive disorder, single episode, unspecified: Secondary | ICD-10-CM | POA: Insufficient documentation

## 2013-06-15 DIAGNOSIS — M129 Arthropathy, unspecified: Secondary | ICD-10-CM | POA: Insufficient documentation

## 2013-06-15 DIAGNOSIS — K219 Gastro-esophageal reflux disease without esophagitis: Secondary | ICD-10-CM | POA: Insufficient documentation

## 2013-06-15 LAB — COMPREHENSIVE METABOLIC PANEL
ALT: 8 U/L (ref 0–35)
AST: 11 U/L (ref 0–37)
Albumin: 3.4 g/dL — ABNORMAL LOW (ref 3.5–5.2)
Alkaline Phosphatase: 59 U/L (ref 39–117)
BUN: 3 mg/dL — ABNORMAL LOW (ref 6–23)
CO2: 22 mEq/L (ref 19–32)
Calcium: 9 mg/dL (ref 8.4–10.5)
Chloride: 105 mEq/L (ref 96–112)
Creatinine, Ser: 0.84 mg/dL (ref 0.50–1.10)
GFR calc Af Amer: >90 mL/min (ref >90)
GFR calc non Af Amer: 84 mL/min — ABNORMAL LOW (ref >90)
Glucose, Bld: 93 mg/dL (ref 70–99)
Potassium: 3.3 mEq/L — ABNORMAL LOW (ref 3.5–5.1)
Sodium: 138 mEq/L (ref 135–145)
Total Bilirubin: 0.8 mg/dL (ref 0.3–1.2)
Total Protein: 6.5 g/dL (ref 6.0–8.3)

## 2013-06-15 LAB — CBC WITH DIFFERENTIAL/PLATELET
Basophils Absolute: 0 10*3/uL (ref 0.0–0.1)
Basophils Relative: 1 % (ref 0–1)
Eosinophils Absolute: 0.2 10*3/uL (ref 0.0–0.7)
Eosinophils Relative: 5 % (ref 0–5)
HCT: 36 % (ref 36.0–46.0)
Hemoglobin: 11 g/dL — ABNORMAL LOW (ref 12.0–15.0)
Lymphocytes Relative: 23 % (ref 12–46)
Lymphs Abs: 1 10*3/uL (ref 0.7–4.0)
MCH: 23.8 pg — ABNORMAL LOW (ref 26.0–34.0)
MCHC: 30.6 g/dL (ref 30.0–36.0)
MCV: 77.8 fL — ABNORMAL LOW (ref 78.0–100.0)
Monocytes Absolute: 0.4 10*3/uL (ref 0.1–1.0)
Monocytes Relative: 9 % (ref 3–12)
Neutro Abs: 2.7 10*3/uL (ref 1.7–7.7)
Neutrophils Relative %: 63 % (ref 43–77)
Platelets: 189 10*3/uL (ref 150–400)
RBC: 4.63 MIL/uL (ref 3.87–5.11)
RDW: 15.9 % — ABNORMAL HIGH (ref 11.5–15.5)
WBC: 4.3 10*3/uL (ref 4.0–10.5)

## 2013-06-15 LAB — URINALYSIS, ROUTINE W REFLEX MICROSCOPIC
Bilirubin Urine: NEGATIVE
Glucose, UA: NEGATIVE mg/dL
Hgb urine dipstick: NEGATIVE
Ketones, ur: NEGATIVE mg/dL
Leukocytes, UA: NEGATIVE
Nitrite: NEGATIVE
Protein, ur: NEGATIVE mg/dL
Specific Gravity, Urine: 1.023 (ref 1.005–1.030)
Urobilinogen, UA: 0.2 mg/dL (ref 0.0–1.0)
pH: 6 (ref 5.0–8.0)

## 2013-06-15 MED ORDER — IPRATROPIUM-ALBUTEROL 20-100 MCG/ACT IN AERS: 2.0000 | INHALATION_SPRAY | Freq: Two times a day (BID) | RESPIRATORY_TRACT | Status: DC

## 2013-06-15 MED ORDER — PROMETHAZINE HCL 25 MG/ML IJ SOLN
12.5000 mg | Freq: Once | INTRAMUSCULAR | Status: AC
  Administered 2013-06-15: 12.5 mg via INTRAVENOUS
  Filled 2013-06-15: qty 1

## 2013-06-15 MED ORDER — MORPHINE SULFATE 10 MG/ML IJ SOLN
10.0000 mg | Freq: Once | INTRAMUSCULAR | Status: AC
  Administered 2013-06-15: 10 mg via INTRAVENOUS
  Filled 2013-06-15: qty 1

## 2013-06-15 MED ORDER — HYDROMORPHONE HCL PF 1 MG/ML IJ SOLN
1.0000 mg | Freq: Once | INTRAMUSCULAR | Status: AC
  Administered 2013-06-15: 1 mg via INTRAVENOUS
  Filled 2013-06-15: qty 1

## 2013-06-15 MED ORDER — KETOROLAC TROMETHAMINE 30 MG/ML IJ SOLN
30.0000 mg | Freq: Once | INTRAMUSCULAR | Status: AC
  Administered 2013-06-15: 30 mg via INTRAVENOUS
  Filled 2013-06-15: qty 1

## 2013-06-15 MED ORDER — SODIUM CHLORIDE 0.9 % IV BOLUS (SEPSIS)
1000.0000 mL | Freq: Once | INTRAVENOUS | Status: AC
  Administered 2013-06-15: 1000 mL via INTRAVENOUS

## 2013-06-15 MED ORDER — POTASSIUM CHLORIDE CRYS ER 20 MEQ PO TBCR: 40.0000 meq | EXTENDED_RELEASE_TABLET | Freq: Once | ORAL | Status: DC

## 2013-06-15 MED ORDER — PROMETHAZINE HCL 25 MG PO TABS: 25.0000 mg | ORAL_TABLET | Freq: Four times a day (QID) | ORAL | Status: DC | PRN

## 2013-06-15 NOTE — ED Notes (Signed)
Per EMS: Pt has been having NV, abd pain x 4 days.  Takes 75 mg morphine/day for ulcerative colitis.

## 2013-06-15 NOTE — ED Provider Notes (Signed)
CSN: [id number]     Arrival date & time 06/15/13  4098 History   First MD Initiated Contact with Patient 06/15/13 [phone]     Chief Complaint  Patient presents with  . Nausea  . Emesis   (Consider location/radiation/quality/duration/timing/severity/associated sxs/prior Treatment) HPI  42 year old female with abdominal pain, nausea, vomiting and diarrhea. Symptom onset approximately 3-4 days ago. Abdominal pain is diffuse and crampy. Patient has a history of ulcerative colitis states that her current symptoms feel similar to previous exacerbations. Has had some loose stools. No melena or blood. No fevers or chills. No urinary complaints. No sick contacts. Denies recent antibiotic use, significant travel history or else her activities. Has been taking morphine at home with minimal relief.  Past Medical History  Diagnosis Date  . Asthma   . Ulcerative colitis   . Anxiety   . Depression   . Chronic back pain   . Chronic knee pain   . Thyroid disease     hypo  . GERD (gastroesophageal reflux disease)   . Insomnia   . Allergic rhinitis   . Arthritis   . COPD (chronic obstructive pulmonary disease)    Past Surgical History  Procedure Laterality Date  . Knee surgery      x3, two on left and one on right.  Arthoscopy  . Cholecystectomy    . Abdominal hysterectomy    . Cesarean section    . Tonsillectomy    . Joint replacement      2x on left, 1x on right   Family History  Problem Relation Age of Onset  . High blood pressure Mother   . Bipolar disorder Mother   . Asthma Father   . Parkinson's disease Father    History  Substance Use Topics  . Smoking status: Former Smoker -- 1.00 packs/day for 3 years    Types: Cigarettes    Quit date: 10/19/2011  . Smokeless tobacco: Never Used  . Alcohol Use: No   OB History   Grav Para Term Preterm Abortions TAB SAB Ect Mult Living                 Review of Systems  All systems reviewed and negative, other than as noted in  HPI.   Allergies  Penicillins  Home Medications   Current Outpatient Rx  Name  Route  Sig  Dispense  Refill  . albuterol (PROVENTIL HFA;VENTOLIN HFA) 108 (90 BASE) MCG/ACT inhaler   Inhalation   Inhale 2 puffs into the lungs every 6 (six) hours as needed for wheezing.         Marland Kitchen ALPRAZolam (XANAX) 1 MG tablet   Oral   Take 1 mg by mouth 3 (three) times daily as needed for anxiety.         Marland Kitchen buPROPion (WELLBUTRIN XL) 300 MG 24 hr tablet   Oral   Take 300 mg by mouth every morning.          . cholecalciferol (VITAMIN D) 1000 UNITS tablet   Oral   Take 1,000 Units by mouth every morning.         . citalopram (CELEXA) 40 MG tablet   Oral   Take 40 mg by mouth every morning.          . famotidine (PEPCID) 20 MG tablet   Oral   Take 20 mg by mouth 2 (two) times daily as needed for heartburn.         . ferrous sulfate 325 (65  FE) MG tablet   Oral   Take 1 tablet (325 mg total) by mouth 2 (two) times daily with a meal.   60 tablet   3   . fluticasone (FLONASE) 50 MCG/ACT nasal spray   Nasal   Place 2 sprays into the nose daily as needed for rhinitis or allergies.         . Fluticasone-Salmeterol (ADVAIR) 500-50 MCG/DOSE AEPB   Inhalation   Inhale 1 puff into the lungs every 12 (twelve) hours.         . furosemide (LASIX) 20 MG tablet   Oral   Take 20 mg by mouth every morning.         Marland Kitchen ipratropium-albuterol (DUONEB) 0.5-2.5 (3) MG/3ML SOLN   Nebulization   Take 3 mLs by nebulization every 6 (six) hours as needed (for wheezing).         Marland Kitchen levothyroxine (SYNTHROID, LEVOTHROID) 150 MCG tablet   Oral   Take 150 mcg by mouth daily before breakfast.         . montelukast (SINGULAIR) 10 MG tablet   Oral   Take 10 mg by mouth at bedtime.          Marland Kitchen morphine (MS CONTIN) 15 MG 12 hr tablet   Oral   Take 15 mg by mouth every 4 (four) hours as needed for pain.         Marland Kitchen morphine (MS CONTIN) 30 MG 12 hr tablet   Oral   Take 1 tablet (30 mg  total) by mouth 2 (two) times daily.   60 tablet   0   . omeprazole (PRILOSEC) 40 MG capsule   Oral   Take 40 mg by mouth every morning.          . traZODone (DESYREL) 100 MG tablet   Oral   Take 100 mg by mouth at bedtime as needed for sleep.          . Ipratropium-Albuterol (COMBIVENT) 20-100 MCG/ACT AERS respimat   Inhalation   Inhale 2 puffs into the lungs 2 (two) times daily.   1 Inhaler   0   . promethazine (PHENERGAN) 25 MG tablet   Oral   Take 1 tablet (25 mg total) by mouth every 6 (six) hours as needed for nausea.   20 tablet   0    BP 154/94  Pulse 80  Temp(Src) 98.4 F (36.9 C) (Oral)  Resp 19  SpO2 98% Physical Exam  Nursing note and vitals reviewed. Constitutional: She appears well-developed and well-nourished. No distress.  Laying in bed. No acute distress. Well appearing.  HENT:  Head: Normocephalic and atraumatic.  Eyes: Conjunctivae are normal. Right eye exhibits no discharge. Left eye exhibits no discharge.  Neck: Neck supple.  Cardiovascular: Normal rate, regular rhythm and normal heart sounds.  Exam reveals no gallop and no friction rub.   No murmur heard. Pulmonary/Chest: Effort normal and breath sounds normal. No respiratory distress.  Abdominal: Soft. She exhibits no distension. There is no tenderness.  Genitourinary:  No CVA tenderness  Musculoskeletal: She exhibits no edema and no tenderness.  Neurological: She is alert.  Skin: Skin is warm and dry.  Psychiatric: She has a normal mood and affect. Her behavior is normal. Thought content normal.    ED Course  Procedures (including critical care time) Labs Review Labs Reviewed  CBC WITH DIFFERENTIAL - Abnormal; Notable for the following:    Hemoglobin 11.0 (*)    MCV 77.8 (*)  MCH 23.8 (*)    RDW 15.9 (*)    All other components within normal limits  COMPREHENSIVE METABOLIC PANEL - Abnormal; Notable for the following:    Potassium 3.3 (*)    BUN 3 (*)    Albumin 3.4 (*)     GFR calc non Af Amer 84 (*)    All other components within normal limits  URINALYSIS, ROUTINE W REFLEX MICROSCOPIC   Imaging Review No results found.  EKG Interpretation   None       MDM   1. Abdominal pain   2. Nausea and vomiting    42 year old female with abdominal pain, nausea, vomiting and diarrhea. History of ulcerative colitis. He been exacerbation of this. Her abdomen is benign. Workup is reassuring. She is hemodynamically stable. Patient starting prescribed morphine for pain control. She states she has prednisone as well. Will additionally prescribe Phenergan for nausea. Patient is also requesting a Combivent inhaler. Return precautions were discussed. Outpatient followup.    Raeford Razor, MD 06/21/13 [id number]

## 2013-06-15 NOTE — ED Notes (Signed)
Urine specimen at bedside

## 2013-06-15 NOTE — Progress Notes (Signed)
                  Dear ________________Cheryl L Atkins___________:  Bonita Quin have been approved to have your discharge prescriptions filled through our [facility] (Medication Assistance Through [facility]) program. This program allows for a one-time (no refills) 34-day supply of selected medications for a low copay amount.  The copay is $3.00 per prescription. For instance, if you have one prescription, you will pay $3.00; for two prescriptions, you pay $6.00; for three prescriptions, you pay $9.00; and so on.  Only certain pharmacies are participating in this program with [facility]. You will need to select one of the pharmacies from the attached list and take your prescriptions, this letter, and your photo ID to one of the participating pharmacies.   We are excited that you are able to use the [facility] program to get your medications. These prescriptions must be filled within 7 days of hospital discharge or they will no longer be valid for the [facility] program. Should you have any problems with your prescriptions please contact your case management team member at [phone].  Thank you,   [employer] [facility]   Participating [facility] Pharmacies  [facility]   [facility] [id number] [place], [place]   [facility] [address] [place], [place]   [facility] [address] [place], [place]   [facility]   [address], [place], [place]   [address] [place], [place], [place]   [address], [place], [place]   [address] [place], [place]   [address] [place], [place]   [address], [place], [place]   [address] [place], [place]   [address] [place], [place]   [address] [place], [place]   [address] [place], [place], [place]  [facility]   [address], [place], [place]   [address] [place],  [place]   [address] [place], [place], [place]   [address] [place], [place]   [address], [place], [place]   [address] [place] [address] [place], [place], [place] [facility]   [address] [place], [place]   [address] [employer] [address], [place], [place]   [address], [place], [place]   [address], [place], [place]   [address], [place], [place]   [address], [place], [place]   [address] [place], [place]   [address], [place], [place]   [address], [place], [place]   [address], [place], [place]   [address], [place], [place]   [address] [place], [place]   [address] [place], [place]   [address] [place], [place], [place]  Independent Pharmacies   [facility] [address] [place], [address] [place], [place]   [facility] [address] [place], [place]   [facility] [address] [place], [place]   For continued medication needs, please contact the [facility] Department at  [phone].

## 2013-06-15 NOTE — Progress Notes (Signed)
[facility] CL provided pt with a list of primary care resources and a [employer] Orange Card application.  °

## 2013-06-15 NOTE — Progress Notes (Signed)
   CARE MANAGEMENT ED NOTE 06/15/2013  Patient:  Debra [name]   Account Number:  [id number]  Date Initiated:  06/15/2013  Documentation initiated by:  Edd Arbour  Subjective/Objective Assessment:   42 yr old self pay [place] pt confirms pcp as Dr Kathrynn Speed Requesting medication assistance for all meds.  Seen by [facility] staff and provided resources Referral from [facility] staff C/o n/v abd pain x 4 days Reported takes morphine qd for UC     Subjective/Objective Assessment Detail:   Pt reports moved to [place] to live with her 43 yr old dtr, her mother & father who is going "to [employer] today"  2 [facility] admissions and 3 ED visits in last 6 months Pt confirmed use of duonebs, celexa, Wellbutrin, synthroid, albuterol inh, Advair, singulair.  Reports Dr Kathrynn Speed has her on a pain management contract Denied seeing a [facility] CM about discussion of MATCH Reports had medicaid until her dtr turned 18 Does not drive with limited transportation States filed for disability a few times without good results Reports will be going to DSS when her mother available to take her (mother unable to leave father at home alone but can now since he will be at [employer]) & has appt with Dr Kathrynn Speed 06/18/13 (previous appt 05/21/13) Pt voiced understanding of MATCH requirements     Action/Plan:   CM reviewed EPIC chart review notes,  spoke with pt, reviewed MATCH, allowed pt time to ventilate her feelings about her father being sick, and desire to get disability   Action/Plan Detail:   CM spoke with the pt about [facility] MATCH program ($3 co pay for each Rx provided by [facility] EDP through [facility] program, does not include refills, 7 day expiration of MATCH letter and choice of pharmacies) Pt agreed to receive assistance from program   Anticipated DC Date:  06/15/2013     Status Recommendation to Physician:   Result of Recommendation:    Other ED Services  Consult Working Plan    DC Planning Services  Other  PCP issues  GCCN /  P4[facility] (established/new)  Outpatient Services - Pt will follow up  Medication Assistance  MATCH Program  CM consult    Choice offered to / List presented to:            Status of service:  Completed, signed off  ED Comments:  Cristy Friedlander Service: 06/15/2013 12:51 PM    P4[facility] CL provided pt with a list of primary care resources and a [employer] application.   ED Comments Detail:  Pt is eligible for [facility] MATCH program (unable to find pt listed in PDMI per cardholder name inquiry) PDMI information entered. MATCH letter completed and provided to pt. This letter & list of participating pharmacies along with the resouces provided by [facility] staff all placed in a pt belonging bag along with pt clothes on chair near her bed

## 2013-06-16 ENCOUNTER — Emergency Department (HOSPITAL_COMMUNITY)
Admission: EM | Admit: 2013-06-16 | Discharge: 2013-06-16 | Disposition: A | Discharge status: Home / Self Care | Payer: Self-pay | Attending: Emergency Medicine | Admitting: Emergency Medicine

## 2013-06-16 ENCOUNTER — Encounter (HOSPITAL_COMMUNITY): Payer: Self-pay | Admitting: Emergency Medicine

## 2013-06-16 DIAGNOSIS — G8929 Other chronic pain: Secondary | ICD-10-CM | POA: Insufficient documentation

## 2013-06-16 DIAGNOSIS — Z79899 Other long term (current) drug therapy: Secondary | ICD-10-CM | POA: Insufficient documentation

## 2013-06-16 DIAGNOSIS — J449 Chronic obstructive pulmonary disease, unspecified: Secondary | ICD-10-CM | POA: Insufficient documentation

## 2013-06-16 DIAGNOSIS — IMO0002 Reserved for concepts with insufficient information to code with codable children: Secondary | ICD-10-CM | POA: Insufficient documentation

## 2013-06-16 DIAGNOSIS — R109 Unspecified abdominal pain: Secondary | ICD-10-CM

## 2013-06-16 DIAGNOSIS — Z880 Allergy status to penicillin: Secondary | ICD-10-CM | POA: Insufficient documentation

## 2013-06-16 DIAGNOSIS — F411 Generalized anxiety disorder: Secondary | ICD-10-CM | POA: Insufficient documentation

## 2013-06-16 DIAGNOSIS — K5190 Ulcerative colitis, unspecified, without complications: Secondary | ICD-10-CM | POA: Insufficient documentation

## 2013-06-16 DIAGNOSIS — Z8739 Personal history of other diseases of the musculoskeletal system and connective tissue: Secondary | ICD-10-CM | POA: Insufficient documentation

## 2013-06-16 DIAGNOSIS — K219 Gastro-esophageal reflux disease without esophagitis: Secondary | ICD-10-CM | POA: Insufficient documentation

## 2013-06-16 DIAGNOSIS — F3289 Other specified depressive episodes: Secondary | ICD-10-CM | POA: Insufficient documentation

## 2013-06-16 DIAGNOSIS — F329 Major depressive disorder, single episode, unspecified: Secondary | ICD-10-CM | POA: Insufficient documentation

## 2013-06-16 DIAGNOSIS — Z87891 Personal history of nicotine dependence: Secondary | ICD-10-CM | POA: Insufficient documentation

## 2013-06-16 DIAGNOSIS — E039 Hypothyroidism, unspecified: Secondary | ICD-10-CM | POA: Insufficient documentation

## 2013-06-16 DIAGNOSIS — J4489 Other specified chronic obstructive pulmonary disease: Secondary | ICD-10-CM | POA: Insufficient documentation

## 2013-06-16 LAB — LIPASE, BLOOD: Lipase: 66 U/L — ABNORMAL HIGH (ref 11–59)

## 2013-06-16 LAB — COMPREHENSIVE METABOLIC PANEL
AST: 24 U/L (ref 0–37)
Albumin: 3.9 g/dL (ref 3.5–5.2)
BUN: 3 mg/dL — ABNORMAL LOW (ref 6–23)
CO2: 22 mEq/L (ref 19–32)
Creatinine, Ser: 0.87 mg/dL (ref 0.50–1.10)
GFR calc Af Amer: >90 mL/min (ref >90)
Total Bilirubin: 0.8 mg/dL (ref 0.3–1.2)
Total Protein: 7.4 g/dL (ref 6.0–8.3)

## 2013-06-16 LAB — URINALYSIS, ROUTINE W REFLEX MICROSCOPIC
Glucose, UA: NEGATIVE mg/dL
Ketones, ur: NEGATIVE mg/dL
Leukocytes, UA: NEGATIVE
Nitrite: NEGATIVE
Specific Gravity, Urine: 1.013 (ref 1.005–1.030)
Urobilinogen, UA: 0.2 mg/dL (ref 0.0–1.0)
pH: 5.5 (ref 5.0–8.0)

## 2013-06-16 LAB — CBC WITH DIFFERENTIAL/PLATELET
Basophils Absolute: 0 10*3/uL (ref 0.0–0.1)
Basophils Relative: 0 % (ref 0–1)
Eosinophils Absolute: 0.3 10*3/uL (ref 0.0–0.7)
Hemoglobin: 11.5 g/dL — ABNORMAL LOW (ref 12.0–15.0)
MCH: 23.7 pg — ABNORMAL LOW (ref 26.0–34.0)
MCHC: 30.4 g/dL (ref 30.0–36.0)
MCV: 77.8 fL — ABNORMAL LOW (ref 78.0–100.0)
Monocytes Absolute: 0.6 10*3/uL (ref 0.1–1.0)
Monocytes Relative: 9 % (ref 3–12)
Neutro Abs: 4.1 10*3/uL (ref 1.7–7.7)
Neutrophils Relative %: 61 % (ref 43–77)
RDW: 15.6 % — ABNORMAL HIGH (ref 11.5–15.5)

## 2013-06-16 MED ORDER — PREDNISONE 10 MG PO TABS: 20.0000 mg | ORAL_TABLET | Freq: Every day | ORAL | Status: DC

## 2013-06-16 MED ORDER — MORPHINE SULFATE 4 MG/ML IJ SOLN
8.0000 mg | Freq: Once | INTRAMUSCULAR | Status: AC
  Administered 2013-06-16: 8 mg via INTRAMUSCULAR
  Filled 2013-06-16: qty 2

## 2013-06-16 MED ORDER — PROMETHAZINE HCL 25 MG/ML IJ SOLN
12.5000 mg | Freq: Once | INTRAMUSCULAR | Status: AC
  Administered 2013-06-16: 12.5 mg via INTRAMUSCULAR
  Filled 2013-06-16: qty 1

## 2013-06-16 MED ORDER — ONDANSETRON HCL 4 MG PO TABS: 4.0000 mg | ORAL_TABLET | Freq: Four times a day (QID) | ORAL | Status: DC

## 2013-06-16 MED ORDER — IPRATROPIUM-ALBUTEROL 18-103 MCG/ACT IN AERO: 2.0000 | INHALATION_SPRAY | Freq: Four times a day (QID) | RESPIRATORY_TRACT | Status: DC | PRN

## 2013-06-16 MED ORDER — DEXAMETHASONE SODIUM PHOSPHATE 10 MG/ML IJ SOLN
10.0000 mg | Freq: Once | INTRAMUSCULAR | Status: AC
  Administered 2013-06-16: 10 mg via INTRAVENOUS
  Filled 2013-06-16: qty 1

## 2013-06-16 NOTE — ED Notes (Signed)
Patient left without signing DC paperwork

## 2013-06-16 NOTE — ED Provider Notes (Signed)
CSN: [id number]     Arrival date & time 06/16/13  1911 History   First MD Initiated Contact with Patient 06/16/13 2126     Chief Complaint  Patient presents with  . Abdominal Pain  . Nausea    HPI  Patient seen and evaluated yesterday. Reports a flare of her abdominal pain she states feels like her flareups of her ulcers colitis. She's not bleeding. She left yesterday. Not able for prescriptions. Presents today with continued symptoms. She lost her inhaler prescription she also lost her prescription she is on chronic pain management. Again no bleeding. No fever.  Past Medical History  Diagnosis Date  . Asthma   . Ulcerative colitis   . Anxiety   . Depression   . Chronic back pain   . Chronic knee pain   . Thyroid disease     hypo  . GERD (gastroesophageal reflux disease)   . Insomnia   . Allergic rhinitis   . Arthritis   . COPD (chronic obstructive pulmonary disease)    Past Surgical History  Procedure Laterality Date  . Knee surgery      x3, two on left and one on right.  Arthoscopy  . Cholecystectomy    . Abdominal hysterectomy    . Cesarean section    . Tonsillectomy    . Joint replacement      2x on left, 1x on right   Family History  Problem Relation Age of Onset  . High blood pressure Mother   . Bipolar disorder Mother   . Asthma Father   . Parkinson's disease Father    History  Substance Use Topics  . Smoking status: Former Smoker -- 1.00 packs/day for 3 years    Types: Cigarettes    Quit date: 10/19/2011  . Smokeless tobacco: Never Used  . Alcohol Use: No   OB History   Grav Para Term Preterm Abortions TAB SAB Ect Mult Living                 Review of Systems  Constitutional: Negative for fever, chills, diaphoresis, appetite change and fatigue.  HENT: Negative for mouth sores, sore throat and trouble swallowing.   Eyes: Negative for visual disturbance.  Respiratory: Negative for cough, chest tightness, shortness of breath and wheezing.    Cardiovascular: Negative for chest pain.  Gastrointestinal: Positive for abdominal pain. Negative for nausea, vomiting, diarrhea and abdominal distention.  Endocrine: Negative for polydipsia, polyphagia and polyuria.  Genitourinary: Negative for dysuria, frequency and hematuria.  Musculoskeletal: Negative for gait problem.  Skin: Negative for color change, pallor and rash.  Neurological: Negative for dizziness, syncope, light-headedness and headaches.  Hematological: Does not bruise/bleed easily.  Psychiatric/Behavioral: Negative for behavioral problems and confusion.    Allergies  Penicillins  Home Medications   Current Outpatient Rx  Name  Route  Sig  Dispense  Refill  . albuterol (PROVENTIL HFA;VENTOLIN HFA) 108 (90 BASE) MCG/ACT inhaler   Inhalation   Inhale 2 puffs into the lungs every 6 (six) hours as needed for wheezing.         Marland Kitchen ALPRAZolam (XANAX) 1 MG tablet   Oral   Take 1 mg by mouth 3 (three) times daily as needed for anxiety.         Marland Kitchen buPROPion (WELLBUTRIN XL) 300 MG 24 hr tablet   Oral   Take 300 mg by mouth every morning.          . cholecalciferol (VITAMIN D) 1000 UNITS tablet  Oral   Take 1,000 Units by mouth every morning.         . citalopram (CELEXA) 40 MG tablet   Oral   Take 40 mg by mouth every morning.          . famotidine (PEPCID) 20 MG tablet   Oral   Take 20 mg by mouth 2 (two) times daily as needed for heartburn.         . ferrous sulfate 325 (65 FE) MG tablet   Oral   Take 1 tablet (325 mg total) by mouth 2 (two) times daily with a meal.   60 tablet   3   . Fluticasone-Salmeterol (ADVAIR) 500-50 MCG/DOSE AEPB   Inhalation   Inhale 1 puff into the lungs every 12 (twelve) hours.         . furosemide (LASIX) 20 MG tablet   Oral   Take 20 mg by mouth every morning.         . Ipratropium-Albuterol (COMBIVENT) 20-100 MCG/ACT AERS respimat   Inhalation   Inhale 2 puffs into the lungs 2 (two) times daily.   1  Inhaler   0   . ipratropium-albuterol (DUONEB) 0.5-2.5 (3) MG/3ML SOLN   Nebulization   Take 3 mLs by nebulization every 6 (six) hours as needed (for wheezing).         Marland Kitchen levothyroxine (SYNTHROID, LEVOTHROID) 150 MCG tablet   Oral   Take 150 mcg by mouth daily before breakfast.         . montelukast (SINGULAIR) 10 MG tablet   Oral   Take 10 mg by mouth at bedtime.          Marland Kitchen morphine (MS CONTIN) 15 MG 12 hr tablet   Oral   Take 15 mg by mouth every 4 (four) hours as needed for pain.         Marland Kitchen morphine (MS CONTIN) 30 MG 12 hr tablet   Oral   Take 1 tablet (30 mg total) by mouth 2 (two) times daily.   60 tablet   0   . omeprazole (PRILOSEC) 40 MG capsule   Oral   Take 40 mg by mouth every morning.          . promethazine (PHENERGAN) 25 MG tablet   Oral   Take 1 tablet (25 mg total) by mouth every 6 (six) hours as needed for nausea.   20 tablet   0   . traZODone (DESYREL) 100 MG tablet   Oral   Take 100 mg by mouth at bedtime as needed for sleep.          Marland Kitchen albuterol-ipratropium (COMBIVENT) 18-103 MCG/ACT inhaler   Inhalation   Inhale 2 puffs into the lungs every 6 (six) hours as needed for wheezing.   1 Inhaler   1   . ondansetron (ZOFRAN) 4 MG tablet   Oral   Take 1 tablet (4 mg total) by mouth every 6 (six) hours.   12 tablet   0   . predniSONE (DELTASONE) 10 MG tablet   Oral   Take 2 tablets (20 mg total) by mouth daily.   10 tablet   0    BP 126/84  Pulse 70  Temp(Src) 98.1 F (36.7 C) (Oral)  Resp 18  SpO2 95% Physical Exam  Constitutional: She is oriented to person, place, and time. She appears well-developed and well-nourished. No distress.  HENT:  Head: Normocephalic.  Eyes: Conjunctivae are normal. Pupils are equal, round,  and reactive to light. No scleral icterus.  Neck: Normal range of motion. Neck supple. No thyromegaly present.  Cardiovascular: Normal rate and regular rhythm.  Exam reveals no gallop and no friction rub.    No murmur heard. Pulmonary/Chest: Effort normal and breath sounds normal. No respiratory distress. She has no wheezes. She has no rales.  Abdominal: Soft. Bowel sounds are normal. She exhibits no distension. There is no tenderness. There is no rebound.  Planes of mild diffuse tenderness. No peritoneal irritation. No localizing pain.  Musculoskeletal: Normal range of motion.  Neurological: She is alert and oriented to person, place, and time.  Skin: Skin is warm and dry. No rash noted.  Psychiatric: She has a normal mood and affect. Her behavior is normal.    ED Course  Procedures (including critical care time) Labs Review Labs Reviewed  CBC WITH DIFFERENTIAL - Abnormal; Notable for the following:    Hemoglobin 11.5 (*)    MCV 77.8 (*)    MCH 23.7 (*)    RDW 15.6 (*)    All other components within normal limits  COMPREHENSIVE METABOLIC PANEL - Abnormal; Notable for the following:    Potassium 3.1 (*)    BUN 3 (*)    GFR calc non Af Amer 80 (*)    All other components within normal limits  LIPASE, BLOOD - Abnormal; Notable for the following:    Lipase 66 (*)    All other components within normal limits  URINALYSIS, ROUTINE W REFLEX MICROSCOPIC   Imaging Review No results found.  EKG Interpretation   None       MDM   1. Abdominal pain   2. Ulcerative colitis    She states essentially of is that he was related in fact is unable to afford medications. She was enrolled with some sort of financial assistance program yesterday by our care manager and social worker. He was given supple pain control measures here with morphine and Phenergan IM. Given her dose of Decadron and written a prescription for prednisone for 5 days. Her labs reassuring yesterday. This is a flare of her colitis and should give her some temporary improvement. She states she has a GI doctor in [facility]. Encourage her to followup with him. Diagnoses abdominal pain with benign exam    Roney Marion,  MD 06/16/13 2252

## 2013-06-16 NOTE — ED Notes (Signed)
Patient dx with ulcerative colitis in 2000 Patient states that one week ago, she has been experiencing pain, N/V/D which she treated herself with at home with Rx Patient came to ED yesterday and DC with Rx for n/v and pain Patient states that she woke up this morning with n/v, unable to take medications or eat No active nausea during assessment

## 2013-06-16 NOTE — ED Notes (Signed)
Per EMS, pt was evaluated for abdominal pain and nausea yesterday, pt was given medications that she has not been able to fill, pt took Phenergan suppository, reporting continued nausea, no vomiting noted at this time.

## 2013-06-18 ENCOUNTER — Encounter (HOSPITAL_COMMUNITY): Payer: Self-pay | Admitting: Emergency Medicine

## 2013-06-18 ENCOUNTER — Emergency Department (HOSPITAL_COMMUNITY)
Admission: EM | Admit: 2013-06-18 | Discharge: 2013-06-18 | Disposition: A | Discharge status: Home / Self Care | Payer: Self-pay | Attending: Emergency Medicine | Admitting: Emergency Medicine

## 2013-06-18 DIAGNOSIS — J449 Chronic obstructive pulmonary disease, unspecified: Secondary | ICD-10-CM | POA: Insufficient documentation

## 2013-06-18 DIAGNOSIS — Z79899 Other long term (current) drug therapy: Secondary | ICD-10-CM | POA: Insufficient documentation

## 2013-06-18 DIAGNOSIS — Z87891 Personal history of nicotine dependence: Secondary | ICD-10-CM | POA: Insufficient documentation

## 2013-06-18 DIAGNOSIS — R109 Unspecified abdominal pain: Secondary | ICD-10-CM | POA: Insufficient documentation

## 2013-06-18 DIAGNOSIS — E079 Disorder of thyroid, unspecified: Secondary | ICD-10-CM | POA: Insufficient documentation

## 2013-06-18 DIAGNOSIS — M25569 Pain in unspecified knee: Secondary | ICD-10-CM | POA: Insufficient documentation

## 2013-06-18 DIAGNOSIS — F3289 Other specified depressive episodes: Secondary | ICD-10-CM | POA: Insufficient documentation

## 2013-06-18 DIAGNOSIS — M549 Dorsalgia, unspecified: Secondary | ICD-10-CM | POA: Insufficient documentation

## 2013-06-18 DIAGNOSIS — F329 Major depressive disorder, single episode, unspecified: Secondary | ICD-10-CM | POA: Insufficient documentation

## 2013-06-18 DIAGNOSIS — F411 Generalized anxiety disorder: Secondary | ICD-10-CM | POA: Insufficient documentation

## 2013-06-18 DIAGNOSIS — K219 Gastro-esophageal reflux disease without esophagitis: Secondary | ICD-10-CM | POA: Insufficient documentation

## 2013-06-18 DIAGNOSIS — IMO0002 Reserved for concepts with insufficient information to code with codable children: Secondary | ICD-10-CM | POA: Insufficient documentation

## 2013-06-18 DIAGNOSIS — R197 Diarrhea, unspecified: Secondary | ICD-10-CM | POA: Insufficient documentation

## 2013-06-18 DIAGNOSIS — G8929 Other chronic pain: Secondary | ICD-10-CM | POA: Insufficient documentation

## 2013-06-18 DIAGNOSIS — Z880 Allergy status to penicillin: Secondary | ICD-10-CM | POA: Insufficient documentation

## 2013-06-18 DIAGNOSIS — J4489 Other specified chronic obstructive pulmonary disease: Secondary | ICD-10-CM | POA: Insufficient documentation

## 2013-06-18 DIAGNOSIS — R112 Nausea with vomiting, unspecified: Secondary | ICD-10-CM

## 2013-06-18 LAB — CBC WITH DIFFERENTIAL/PLATELET
Basophils Relative: 1 % (ref 0–1)
Hemoglobin: 12.3 g/dL (ref 12.0–15.0)
Lymphocytes Relative: 20 % (ref 12–46)
Lymphs Abs: 1 10*3/uL (ref 0.7–4.0)
MCHC: 30.6 g/dL (ref 30.0–36.0)
Monocytes Relative: 6 % (ref 3–12)
Neutro Abs: 3.6 10*3/uL (ref 1.7–7.7)
Neutrophils Relative %: 70 % (ref 43–77)
RBC: 5.1 MIL/uL (ref 3.87–5.11)
WBC: 5.2 10*3/uL (ref 4.0–10.5)

## 2013-06-18 LAB — COMPREHENSIVE METABOLIC PANEL
Albumin: 4 g/dL (ref 3.5–5.2)
Alkaline Phosphatase: 70 U/L (ref 39–117)
BUN: 5 mg/dL — ABNORMAL LOW (ref 6–23)
CO2: 23 mEq/L (ref 19–32)
Calcium: 9.9 mg/dL (ref 8.4–10.5)
Chloride: 105 mEq/L (ref 96–112)
GFR calc Af Amer: 84 mL/min — ABNORMAL LOW (ref >90)
GFR calc non Af Amer: 72 mL/min — ABNORMAL LOW (ref >90)
Glucose, Bld: 92 mg/dL (ref 70–99)
Potassium: 3.3 mEq/L — ABNORMAL LOW (ref 3.5–5.1)
Total Bilirubin: 0.8 mg/dL (ref 0.3–1.2)

## 2013-06-18 LAB — LIPASE, BLOOD: Lipase: 35 U/L (ref 11–59)

## 2013-06-18 MED ORDER — MORPHINE SULFATE 4 MG/ML IJ SOLN
4.0000 mg | Freq: Once | INTRAMUSCULAR | Status: AC
  Administered 2013-06-18: 4 mg via INTRAVENOUS
  Filled 2013-06-18: qty 1

## 2013-06-18 MED ORDER — SODIUM CHLORIDE 0.9 % IV BOLUS (SEPSIS)
1000.0000 mL | Freq: Once | INTRAVENOUS | Status: AC
  Administered 2013-06-18: 1000 mL via INTRAVENOUS

## 2013-06-18 MED ORDER — PROMETHAZINE HCL 25 MG/ML IJ SOLN
25.0000 mg | Freq: Once | INTRAMUSCULAR | Status: AC
  Administered 2013-06-18: 25 mg via INTRAVENOUS
  Filled 2013-06-18: qty 1

## 2013-06-18 MED ORDER — METHYLPREDNISOLONE SODIUM SUCC 125 MG IJ SOLR
125.0000 mg | Freq: Once | INTRAMUSCULAR | Status: AC
  Administered 2013-06-18: 125 mg via INTRAVENOUS
  Filled 2013-06-18: qty 2

## 2013-06-18 MED ORDER — ONDANSETRON HCL 4 MG/2ML IJ SOLN
4.0000 mg | Freq: Once | INTRAMUSCULAR | Status: AC
  Administered 2013-06-18: 4 mg via INTRAVENOUS
  Filled 2013-06-18: qty 2

## 2013-06-18 NOTE — ED Provider Notes (Signed)
CSN: [id number]     Arrival date & time 06/18/13  1039 History   First MD Initiated Contact with Patient 06/18/13 1045     Chief Complaint  Patient presents with  . Nausea  . Emesis  . Diarrhea   (Consider location/radiation/quality/duration/timing/severity/associated sxs/prior Treatment) HPI Comments: Patient presents to the emergency department with chief complaint of nausea, vomiting, and diarrhea x3 days. She has a history of ulcerative colitis. She states this feels like a flareup. She states that she has been unable to tolerate any oral intake. She has been seen in the emergency department twice in the past couple of days. She states that she has prednisone at home, but is unable to take it because she cannot keep it down. She reports bilious vomit, but denies seeing any blood in her vomit. Denies any blood in her stool. She denies fevers or chills.  The history is provided by the patient. No language interpreter was used.    Past Medical History  Diagnosis Date  . Asthma   . Ulcerative colitis   . Anxiety   . Depression   . Chronic back pain   . Chronic knee pain   . Thyroid disease     hypo  . GERD (gastroesophageal reflux disease)   . Insomnia   . Allergic rhinitis   . Arthritis   . COPD (chronic obstructive pulmonary disease)    Past Surgical History  Procedure Laterality Date  . Knee surgery      x3, two on left and one on right.  Arthoscopy  . Cholecystectomy    . Abdominal hysterectomy    . Cesarean section    . Tonsillectomy    . Joint replacement      2x on left, 1x on right   Family History  Problem Relation Age of Onset  . High blood pressure Mother   . Bipolar disorder Mother   . Asthma Father   . Parkinson's disease Father    History  Substance Use Topics  . Smoking status: Former Smoker -- 1.00 packs/day for 3 years    Types: Cigarettes    Quit date: 10/19/2011  . Smokeless tobacco: Never Used  . Alcohol Use: No   OB History   Grav Para  Term Preterm Abortions TAB SAB Ect Mult Living                 Review of Systems  All other systems reviewed and are negative.    Allergies  Penicillins  Home Medications   Current Outpatient Rx  Name  Route  Sig  Dispense  Refill  . albuterol (PROVENTIL HFA;VENTOLIN HFA) 108 (90 BASE) MCG/ACT inhaler   Inhalation   Inhale 2 puffs into the lungs every 6 (six) hours as needed for wheezing.         Marland Kitchen albuterol-ipratropium (COMBIVENT) 18-103 MCG/ACT inhaler   Inhalation   Inhale 2 puffs into the lungs every 6 (six) hours as needed for wheezing.   1 Inhaler   1   . ALPRAZolam (XANAX) 1 MG tablet   Oral   Take 1 mg by mouth 3 (three) times daily as needed for anxiety.         Marland Kitchen buPROPion (WELLBUTRIN XL) 300 MG 24 hr tablet   Oral   Take 300 mg by mouth every morning.          . cholecalciferol (VITAMIN D) 1000 UNITS tablet   Oral   Take 1,000 Units by mouth every morning.         Marland Kitchen  citalopram (CELEXA) 40 MG tablet   Oral   Take 40 mg by mouth every morning.          . famotidine (PEPCID) 20 MG tablet   Oral   Take 20 mg by mouth 2 (two) times daily as needed for heartburn.         . ferrous sulfate 325 (65 FE) MG tablet   Oral   Take 1 tablet (325 mg total) by mouth 2 (two) times daily with a meal.   60 tablet   3   . Fluticasone-Salmeterol (ADVAIR) 500-50 MCG/DOSE AEPB   Inhalation   Inhale 1 puff into the lungs every 12 (twelve) hours.         . furosemide (LASIX) 20 MG tablet   Oral   Take 20 mg by mouth every morning.         . Ipratropium-Albuterol (COMBIVENT) 20-100 MCG/ACT AERS respimat   Inhalation   Inhale 2 puffs into the lungs 2 (two) times daily.   1 Inhaler   0   . ipratropium-albuterol (DUONEB) 0.5-2.5 (3) MG/3ML SOLN   Nebulization   Take 3 mLs by nebulization every 6 (six) hours as needed (for wheezing).         Marland Kitchen levothyroxine (SYNTHROID, LEVOTHROID) 150 MCG tablet   Oral   Take 150 mcg by mouth daily before  breakfast.         . montelukast (SINGULAIR) 10 MG tablet   Oral   Take 10 mg by mouth at bedtime.          Marland Kitchen morphine (MS CONTIN) 15 MG 12 hr tablet   Oral   Take 15 mg by mouth every 4 (four) hours as needed for pain.         Marland Kitchen morphine (MS CONTIN) 30 MG 12 hr tablet   Oral   Take 1 tablet (30 mg total) by mouth 2 (two) times daily.   60 tablet   0   . omeprazole (PRILOSEC) 40 MG capsule   Oral   Take 40 mg by mouth every morning.          . ondansetron (ZOFRAN) 4 MG tablet   Oral   Take 1 tablet (4 mg total) by mouth every 6 (six) hours.   12 tablet   0   . promethazine (PHENERGAN) 25 MG tablet   Oral   Take 1 tablet (25 mg total) by mouth every 6 (six) hours as needed for nausea.   20 tablet   0   . traZODone (DESYREL) 100 MG tablet   Oral   Take 100 mg by mouth at bedtime as needed for sleep.           BP 160/74  Pulse 69  Temp(Src) 98.2 F (36.8 C) (Oral)  Resp 18  SpO2 96% Physical Exam  Nursing note and vitals reviewed. Constitutional: She is oriented to person, place, and time. She appears well-developed and well-nourished.  HENT:  Head: Normocephalic and atraumatic.  Eyes: Conjunctivae and EOM are normal. Pupils are equal, round, and reactive to light.  Neck: Normal range of motion. Neck supple.  Cardiovascular: Normal rate and regular rhythm.  Exam reveals no gallop and no friction rub.   No murmur heard. Pulmonary/Chest: Effort normal and breath sounds normal. No respiratory distress. She has no wheezes. She has no rales. She exhibits no tenderness.  Abdominal: Soft. Bowel sounds are normal. She exhibits no distension and no mass. There is no tenderness. There is  no rebound and no guarding.  Diffuse crampy abdominal tenderness, no focal abdominal tenderness, no pain at McBurney's point, no Murphy's sign, no fluid wave or signs of peritonitis  Musculoskeletal: Normal range of motion. She exhibits no edema and no tenderness.  Neurological:  She is alert and oriented to person, place, and time.  Skin: Skin is warm and dry.  Psychiatric: She has a normal mood and affect. Her behavior is normal. Judgment and thought content normal.    ED Course  Procedures (including critical care time) Results for orders placed during the hospital encounter of 06/18/13  CBC WITH DIFFERENTIAL      Result Value Range   WBC 5.2  4.0 - 10.5 K/uL   RBC 5.10  3.87 - 5.11 MIL/uL   Hemoglobin 12.3  12.0 - 15.0 g/dL   HCT 19.1  47.8 - 29.5 %   MCV 78.8  78.0 - 100.0 fL   MCH 24.1 (*) 26.0 - 34.0 pg   MCHC 30.6  30.0 - 36.0 g/dL   RDW 62.1 (*) 30.8 - 65.7 %   Platelets 195  150 - 400 K/uL   Neutrophils Relative % 70  43 - 77 %   Neutro Abs 3.6  1.7 - 7.7 K/uL   Lymphocytes Relative 20  12 - 46 %   Lymphs Abs 1.0  0.7 - 4.0 K/uL   Monocytes Relative 6  3 - 12 %   Monocytes Absolute 0.3  0.1 - 1.0 K/uL   Eosinophils Relative 4  0 - 5 %   Eosinophils Absolute 0.2  0.0 - 0.7 K/uL   Basophils Relative 1  0 - 1 %   Basophils Absolute 0.0  0.0 - 0.1 K/uL  COMPREHENSIVE METABOLIC PANEL      Result Value Range   Sodium 140  135 - 145 mEq/L   Potassium 3.3 (*) 3.5 - 5.1 mEq/L   Chloride 105  96 - 112 mEq/L   CO2 23  19 - 32 mEq/L   Glucose, Bld 92  70 - 99 mg/dL   BUN 5 (*) 6 - 23 mg/dL   Creatinine, Ser 8.46  0.50 - 1.10 mg/dL   Calcium 9.9  8.4 - 96.2 mg/dL   Total Protein 7.5  6.0 - 8.3 g/dL   Albumin 4.0  3.5 - 5.2 g/dL   AST 15  0 - 37 U/L   ALT 10  0 - 35 U/L   Alkaline Phosphatase 70  39 - 117 U/L   Total Bilirubin 0.8  0.3 - 1.2 mg/dL   GFR calc non Af Amer 72 (*) >90 mL/min   GFR calc Af Amer 84 (*) >90 mL/min  LIPASE, BLOOD      Result Value Range   Lipase 35  11 - 59 U/L   Dg Chest 2 View  05/30/2013   *RADIOLOGY REPORT*  Clinical Data: Wheezing and bilateral lower extremity swelling. History of asthma.  CHEST - 2 VIEW  Comparison: Chest radiograph performed 05/18/2013  Findings: The lungs are relatively well-aerated.  Mild  vascular congestion is noted.  There is no evidence of focal opacification, pleural effusion or pneumothorax.  The heart is normal in size; the mediastinal contour is within normal limits.  No acute osseous abnormalities are seen.  IMPRESSION: Mild vascular congestion noted; lungs remain grossly clear.   Original Report Authenticated By: Tonia Ghent, M.D.    Imaging Review No results found.  EKG Interpretation   None  MDM   1. Abdominal  pain, other specified site   2. Nausea and vomiting    Patient with nausea, vomiting, diarrhea. Will give fluids, will treat to some Solu-Medrol. Will reevaluate. Will check labs.  Patient still complaining of pain and nausea, but is asking for a gingerale.    This appears to be UC flare.  She has been seen 3 times in the past 3 days for the same.  She has prednisone at home. Labs are reassuring. Abdomen is benign.  Will discharge to home.  PCP follow up.  Discussed the patient with Dr. Anitra Lauth, who agrees with the plan.   Roxy Horseman, PA-C 06/18/13 1326

## 2013-06-18 NOTE — ED Notes (Signed)
Patient's family came to desk to tell us patient was sitting in urine. Patient cleaned up and linen changed. RN Michaelle Copas and PA Rob notified that patient was incontinent.

## 2013-06-18 NOTE — ED Provider Notes (Signed)
Medical screening examination/treatment/procedure(s) were performed by non-physician practitioner and as supervising physician I was immediately available for consultation/collaboration.  EKG Interpretation   None         Gwyneth Sprout, MD 06/18/13 1514

## 2013-06-18 NOTE — Progress Notes (Signed)
[facility] spoke with patient on 10/27 about the [facility] and also provided pt with a list of primary care resources. Spoke with patient again today to see if she had any questions about the paperwork I gave her on last ED visit. Patient did not have any questions at this time and I provided her with the paperwork again.

## 2013-06-18 NOTE — Progress Notes (Signed)
   CARE MANAGEMENT ED NOTE 06/18/2013  Patient:  Debra [name],Debra [name]   Account Number:  [id number]  Date Initiated:  06/18/2013  Documentation initiated by:  Edd Arbour  Subjective/Objective Assessment:   42 yr old self pay [place] pt with 5 ed visits and 2 admissions in the last 6 months Last seen by this Cm on 06/15/13 and provided with [facility] MATCH medication asssistance Referral from [facility] staff     Subjective/Objective Assessment Detail:   Pt reports she has not picked up her Rx from 06/15/13  r/t not having money/$3 co pays/Rx. Today pt's mother and daughter present Confirms father is now in [place] Confirms her pcp is Dr Kathrynn Speed & she has an upcoming appointment with pcp.  Reports her MATCH letter and resources  lost in EMS transport on 06/15/13 Mother states she has borrowed money to assist pt with medications today. Choice of participating [facility] pharmacy is [facility] in [place]     Action/Plan:   CM spoke with pt, mother and pt's daughter to review MATCH requirements Discussed MATCH letter from 06/15/13 expires on 06/22/13 and [facility] does not have a program to provide money for medicine co pays A copy of 06/15/13 letter provided   Action/Plan Detail:   CM re-reviewed with mother [place] self pay pcps, affordable care act, DSS, MAP at health department, needymeds.org, discount pharmacies, delivering pharmacies & financial assistance services Encouraged to visit local churches also   Anticipated DC Date:  06/18/2013     Status Recommendation to Physician:   Result of Recommendation:    Other ED Services  Consult Working Plan    DC Planning Services  Other  [facility] / [facility] (established/new)  PCP issues  Medication Assistance  MATCH Program    Choice offered to / List presented to:  C-6 Parent          Status of service:  Completed, signed off  ED Comments:   ED Comments Detail:  Mother voiced understanding about [facility] MATCH program ($3 co pay for each  Rx through [facility] program, does not include refills, 7 day expiration of MATCH letter and choice of pharmacies) Instructed that the letter and prescriptions need to be presented to the pharmacist

## 2013-06-18 NOTE — ED Notes (Signed)
Bed: WA17 Expected date:  Expected time:  Means of arrival:  Comments: N/v x3 days

## 2013-06-18 NOTE — ED Notes (Addendum)
Per EMS: pt c/o n/v/d x 3 days. Dx with ulcerative colitis. Was given a prescription for zofran and prednisone but could not afford it. 8 mg of Zofran given in route

## 2013-08-22 ENCOUNTER — Emergency Department (HOSPITAL_BASED_OUTPATIENT_CLINIC_OR_DEPARTMENT_OTHER)
Admission: EM | Admit: 2013-08-22 | Discharge: 2013-08-22 | Disposition: A | Discharge status: Home / Self Care | Payer: Self-pay | Attending: Emergency Medicine | Admitting: Emergency Medicine

## 2013-08-22 ENCOUNTER — Emergency Department (HOSPITAL_BASED_OUTPATIENT_CLINIC_OR_DEPARTMENT_OTHER): Payer: Self-pay

## 2013-08-22 ENCOUNTER — Encounter (HOSPITAL_BASED_OUTPATIENT_CLINIC_OR_DEPARTMENT_OTHER): Payer: Self-pay | Admitting: Emergency Medicine

## 2013-08-22 DIAGNOSIS — J4489 Other specified chronic obstructive pulmonary disease: Secondary | ICD-10-CM | POA: Insufficient documentation

## 2013-08-22 DIAGNOSIS — F329 Major depressive disorder, single episode, unspecified: Secondary | ICD-10-CM | POA: Insufficient documentation

## 2013-08-22 DIAGNOSIS — F3289 Other specified depressive episodes: Secondary | ICD-10-CM | POA: Insufficient documentation

## 2013-08-22 DIAGNOSIS — Z87891 Personal history of nicotine dependence: Secondary | ICD-10-CM | POA: Insufficient documentation

## 2013-08-22 DIAGNOSIS — F411 Generalized anxiety disorder: Secondary | ICD-10-CM | POA: Insufficient documentation

## 2013-08-22 DIAGNOSIS — J449 Chronic obstructive pulmonary disease, unspecified: Secondary | ICD-10-CM | POA: Insufficient documentation

## 2013-08-22 DIAGNOSIS — J181 Lobar pneumonia, unspecified organism: Secondary | ICD-10-CM

## 2013-08-22 DIAGNOSIS — M129 Arthropathy, unspecified: Secondary | ICD-10-CM | POA: Insufficient documentation

## 2013-08-22 DIAGNOSIS — G8929 Other chronic pain: Secondary | ICD-10-CM | POA: Insufficient documentation

## 2013-08-22 DIAGNOSIS — J189 Pneumonia, unspecified organism: Secondary | ICD-10-CM | POA: Insufficient documentation

## 2013-08-22 DIAGNOSIS — Z79899 Other long term (current) drug therapy: Secondary | ICD-10-CM | POA: Insufficient documentation

## 2013-08-22 DIAGNOSIS — E039 Hypothyroidism, unspecified: Secondary | ICD-10-CM | POA: Insufficient documentation

## 2013-08-22 DIAGNOSIS — K219 Gastro-esophageal reflux disease without esophagitis: Secondary | ICD-10-CM | POA: Insufficient documentation

## 2013-08-22 DIAGNOSIS — Z880 Allergy status to penicillin: Secondary | ICD-10-CM | POA: Insufficient documentation

## 2013-08-22 DIAGNOSIS — M549 Dorsalgia, unspecified: Secondary | ICD-10-CM | POA: Insufficient documentation

## 2013-08-22 DIAGNOSIS — R109 Unspecified abdominal pain: Secondary | ICD-10-CM | POA: Insufficient documentation

## 2013-08-22 MED ORDER — IBUPROFEN 800 MG PO TABS
800.0000 mg | ORAL_TABLET | Freq: Once | ORAL | Status: AC
  Administered 2013-08-22: 800 mg via ORAL
  Filled 2013-08-22: qty 1

## 2013-08-22 MED ORDER — NAPROXEN 500 MG PO TABS: 500.0000 mg | ORAL_TABLET | Freq: Two times a day (BID) | ORAL | Status: DC

## 2013-08-22 MED ORDER — AZITHROMYCIN 250 MG PO TABS: ORAL_TABLET | ORAL | Status: DC

## 2013-08-22 MED ORDER — LEVOFLOXACIN 750 MG PO TABS: 750.0000 mg | ORAL_TABLET | Freq: Every day | ORAL | Status: DC

## 2013-08-22 NOTE — ED Notes (Signed)
Pt having back pain and sob for two weeks.  Pt also c/o left rib pain.  Pt states she had fever four days ago.

## 2013-08-22 NOTE — ED Provider Notes (Signed)
CSN: [id number]     Arrival date & time 08/22/13  1108 History   First MD Initiated Contact with Patient 08/22/13 1220     Chief Complaint  Patient presents with  . Shortness of Breath  . Back Pain   (Consider location/radiation/quality/duration/timing/severity/associated sxs/prior Treatment) HPI Comments: 43 yo female with asthma, gerd, pneumonia hx presents with left flank pain and cough/ fever for 4 days. Similar to previous pneumonia.  Pain with coughing.  No blood clot hx.    The history is provided by the patient.    Past Medical History  Diagnosis Date  . Asthma   . Ulcerative colitis   . Anxiety   . Depression   . Chronic back pain   . Chronic knee pain   . Thyroid disease     hypo  . GERD (gastroesophageal reflux disease)   . Insomnia   . Allergic rhinitis   . Arthritis   . COPD (chronic obstructive pulmonary disease)    Past Surgical History  Procedure Laterality Date  . Knee surgery      x3, two on left and one on right.  Arthoscopy  . Cholecystectomy    . Abdominal hysterectomy    . Cesarean section    . Tonsillectomy    . Joint replacement      2x on left, 1x on right   Family History  Problem Relation Age of Onset  . High blood pressure Mother   . Bipolar disorder Mother   . Asthma Father   . Parkinson's disease Father    History  Substance Use Topics  . Smoking status: Former Smoker -- 1.00 packs/day for 3 years    Types: Cigarettes    Quit date: 10/19/2011  . Smokeless tobacco: Never Used  . Alcohol Use: No   OB History   Grav Para Term Preterm Abortions TAB SAB Ect Mult Living                 Review of Systems  Constitutional: Negative for fever and chills.  HENT: Positive for congestion.   Respiratory: Positive for cough. Negative for shortness of breath.   Cardiovascular: Negative for chest pain.  Gastrointestinal: Negative for vomiting and abdominal pain.  Genitourinary: Positive for flank pain. Negative for dysuria.   Musculoskeletal: Positive for arthralgias and back pain. Negative for neck pain and neck stiffness.  Skin: Negative for rash.  Neurological: Negative for light-headedness and headaches.    Allergies  Penicillins  Home Medications   Current Outpatient Rx  Name  Route  Sig  Dispense  Refill  . albuterol (PROVENTIL HFA;VENTOLIN HFA) 108 (90 BASE) MCG/ACT inhaler   Inhalation   Inhale 2 puffs into the lungs every 6 (six) hours as needed for wheezing.         Marland Kitchen albuterol-ipratropium (COMBIVENT) 18-103 MCG/ACT inhaler   Inhalation   Inhale 2 puffs into the lungs every 6 (six) hours as needed for wheezing.   1 Inhaler   1   . ALPRAZolam (XANAX) 1 MG tablet   Oral   Take 1 mg by mouth 3 (three) times daily as needed for anxiety.         Marland Kitchen azithromycin (ZITHROMAX Z-PAK) 250 MG tablet      2 po day one, then 1 daily x 4 days   5 tablet   0   . buPROPion (WELLBUTRIN XL) 300 MG 24 hr tablet   Oral   Take 300 mg by mouth every morning.          Marland Kitchen  cholecalciferol (VITAMIN D) 1000 UNITS tablet   Oral   Take 1,000 Units by mouth every morning.         . citalopram (CELEXA) 40 MG tablet   Oral   Take 40 mg by mouth every morning.          . famotidine (PEPCID) 20 MG tablet   Oral   Take 20 mg by mouth 2 (two) times daily as needed for heartburn.         . ferrous sulfate 325 (65 FE) MG tablet   Oral   Take 1 tablet (325 mg total) by mouth 2 (two) times daily with a meal.   60 tablet   3   . Fluticasone-Salmeterol (ADVAIR) 500-50 MCG/DOSE AEPB   Inhalation   Inhale 1 puff into the lungs every 12 (twelve) hours.         . furosemide (LASIX) 20 MG tablet   Oral   Take 20 mg by mouth every morning.         . Ipratropium-Albuterol (COMBIVENT) 20-100 MCG/ACT AERS respimat   Inhalation   Inhale 2 puffs into the lungs 2 (two) times daily.   1 Inhaler   0   . ipratropium-albuterol (DUONEB) 0.5-2.5 (3) MG/3ML SOLN   Nebulization   Take 3 mLs by  nebulization every 6 (six) hours as needed (for wheezing).         Marland Kitchen levothyroxine (SYNTHROID, LEVOTHROID) 150 MCG tablet   Oral   Take 150 mcg by mouth daily before breakfast.         . montelukast (SINGULAIR) 10 MG tablet   Oral   Take 10 mg by mouth at bedtime.          Marland Kitchen morphine (MS CONTIN) 15 MG 12 hr tablet   Oral   Take 15 mg by mouth every 4 (four) hours as needed for pain.         Marland Kitchen morphine (MS CONTIN) 30 MG 12 hr tablet   Oral   Take 1 tablet (30 mg total) by mouth 2 (two) times daily.   60 tablet   0   . naproxen (NAPROSYN) 500 MG tablet   Oral   Take 1 tablet (500 mg total) by mouth 2 (two) times daily.   12 tablet   0   . omeprazole (PRILOSEC) 40 MG capsule   Oral   Take 40 mg by mouth every morning.          . ondansetron (ZOFRAN) 4 MG tablet   Oral   Take 1 tablet (4 mg total) by mouth every 6 (six) hours.   12 tablet   0   . promethazine (PHENERGAN) 25 MG tablet   Oral   Take 1 tablet (25 mg total) by mouth every 6 (six) hours as needed for nausea.   20 tablet   0   . traZODone (DESYREL) 100 MG tablet   Oral   Take 100 mg by mouth at bedtime as needed for sleep.           BP 108/63  Pulse 89  Temp(Src) 99 F (37.2 C) (Oral)  Resp 22  Ht 5\' 2"  (1.575 m)  Wt 210 lb (95.255 kg)  BMI 38.40 kg/m2  SpO2 97% Physical Exam  Nursing note and vitals reviewed. Constitutional: She is oriented to person, place, and time. She appears well-developed and well-nourished.  HENT:  Head: Normocephalic and atraumatic.  Eyes: Conjunctivae are normal. Right eye exhibits no discharge. Left  eye exhibits no discharge.  Neck: Normal range of motion. Neck supple. No tracheal deviation present.  Cardiovascular: Normal rate and regular rhythm.   Pulmonary/Chest: Effort normal. She has rales (left lower base).  Abdominal: Soft. She exhibits no distension. There is no tenderness. There is no guarding.  Musculoskeletal: She exhibits no edema.   Neurological: She is alert and oriented to person, place, and time.  Skin: Skin is warm. No rash noted.  Psychiatric: She has a normal mood and affect.    ED Course  Procedures (including critical care time) Labs Review Labs Reviewed - No data to display Imaging Review Dg Chest 2 View  08/22/2013   CLINICAL DATA:  Chronic COPD, shortness of breath and back pain  EXAM: CHEST  2 VIEW  COMPARISON:  05/30/2013  FINDINGS: There is left lower lobe consolidation. There is bilateral mild interstitial thickening likely chronic. There is bibasilar atelectasis. Stable cardiomediastinal silhouette.  The osseous structures are unremarkable.  IMPRESSION: Left lower lobe pneumonia. Recommend followup radiography in 4-6 weeks, to document complete resolution following adequate medical therapy. If there is not complete resolution, then recommend further evaluation with CT of the chest to exclude underlying pathology.   Electronically Signed   By: Kathreen Devoid   On: 08/22/2013 12:05    EKG Interpretation   None       MDM   1. Left lower lobe pneumonia    Pneumonia hx. Well appearing in ED, normal vitals on my exam. Discussed close fup outpt Ibuprofen in ED. Z pack changed to levaquin due to lung dz and pneumonia hx.   Results and differential diagnosis were discussed with the patient. Close follow up outpatient was discussed, patient comfortable with the plan.   Diagnosis: above    Mariea Clonts, MD 08/22/13 1258

## 2013-08-22 NOTE — Discharge Instructions (Signed)
If you were given medicines take as directed.  If you are on coumadin or contraceptives realize their levels and effectiveness is altered by many different medicines.  If you have any reaction (rash, tongues swelling, other) to the medicines stop taking and see a physician.   °Please follow up as directed and return to the ER or see a physician for new or worsening symptoms.  Thank you. ° ° °

## 2013-09-20 ENCOUNTER — Encounter (HOSPITAL_BASED_OUTPATIENT_CLINIC_OR_DEPARTMENT_OTHER): Payer: Self-pay | Admitting: Emergency Medicine

## 2013-09-20 ENCOUNTER — Emergency Department (HOSPITAL_BASED_OUTPATIENT_CLINIC_OR_DEPARTMENT_OTHER): Payer: Self-pay

## 2013-09-20 ENCOUNTER — Emergency Department (HOSPITAL_BASED_OUTPATIENT_CLINIC_OR_DEPARTMENT_OTHER)
Admission: EM | Admit: 2013-09-20 | Discharge: 2013-09-20 | Disposition: A | Discharge status: Home / Self Care | Payer: Self-pay | Attending: Emergency Medicine | Admitting: Emergency Medicine

## 2013-09-20 DIAGNOSIS — M549 Dorsalgia, unspecified: Secondary | ICD-10-CM | POA: Insufficient documentation

## 2013-09-20 DIAGNOSIS — Z87891 Personal history of nicotine dependence: Secondary | ICD-10-CM | POA: Insufficient documentation

## 2013-09-20 DIAGNOSIS — Z880 Allergy status to penicillin: Secondary | ICD-10-CM | POA: Insufficient documentation

## 2013-09-20 DIAGNOSIS — Z792 Long term (current) use of antibiotics: Secondary | ICD-10-CM | POA: Insufficient documentation

## 2013-09-20 DIAGNOSIS — F3289 Other specified depressive episodes: Secondary | ICD-10-CM | POA: Insufficient documentation

## 2013-09-20 DIAGNOSIS — F329 Major depressive disorder, single episode, unspecified: Secondary | ICD-10-CM | POA: Insufficient documentation

## 2013-09-20 DIAGNOSIS — G8929 Other chronic pain: Secondary | ICD-10-CM | POA: Insufficient documentation

## 2013-09-20 DIAGNOSIS — K219 Gastro-esophageal reflux disease without esophagitis: Secondary | ICD-10-CM | POA: Insufficient documentation

## 2013-09-20 DIAGNOSIS — J189 Pneumonia, unspecified organism: Secondary | ICD-10-CM | POA: Insufficient documentation

## 2013-09-20 DIAGNOSIS — Z791 Long term (current) use of non-steroidal anti-inflammatories (NSAID): Secondary | ICD-10-CM | POA: Insufficient documentation

## 2013-09-20 DIAGNOSIS — F411 Generalized anxiety disorder: Secondary | ICD-10-CM | POA: Insufficient documentation

## 2013-09-20 DIAGNOSIS — J45901 Unspecified asthma with (acute) exacerbation: Principal | ICD-10-CM

## 2013-09-20 DIAGNOSIS — Z79899 Other long term (current) drug therapy: Secondary | ICD-10-CM | POA: Insufficient documentation

## 2013-09-20 DIAGNOSIS — E039 Hypothyroidism, unspecified: Secondary | ICD-10-CM | POA: Insufficient documentation

## 2013-09-20 DIAGNOSIS — J441 Chronic obstructive pulmonary disease with (acute) exacerbation: Secondary | ICD-10-CM | POA: Insufficient documentation

## 2013-09-20 DIAGNOSIS — M129 Arthropathy, unspecified: Secondary | ICD-10-CM | POA: Insufficient documentation

## 2013-09-20 DIAGNOSIS — R062 Wheezing: Secondary | ICD-10-CM

## 2013-09-20 MED ORDER — ALBUTEROL (5 MG/ML) CONTINUOUS INHALATION SOLN
10.0000 mg/h | INHALATION_SOLUTION | Freq: Once | RESPIRATORY_TRACT | Status: AC
  Administered 2013-09-20: 10 mg/h via RESPIRATORY_TRACT
  Filled 2013-09-20: qty 20

## 2013-09-20 MED ORDER — PREDNISONE 10 MG PO TABS: 20.0000 mg | ORAL_TABLET | Freq: Every day | ORAL | Status: DC

## 2013-09-20 MED ORDER — METHYLPREDNISOLONE SODIUM SUCC 125 MG IJ SOLR
125.0000 mg | Freq: Once | INTRAMUSCULAR | Status: AC
  Administered 2013-09-20: 125 mg via INTRAVENOUS
  Filled 2013-09-20: qty 2

## 2013-09-20 MED ORDER — IPRATROPIUM BROMIDE HFA 17 MCG/ACT IN AERS: 2.0000 | INHALATION_SPRAY | RESPIRATORY_TRACT | Status: DC | PRN

## 2013-09-20 NOTE — ED Notes (Signed)
Spoke with Estill Batten, Case Manager, with [facility]. She is going to call pt in regards to followup care for COPD management, per DC order Dr. Jeanell Sparrow. She will call pt within the hour. Cell # [phone].  Home Phone [phone]

## 2013-09-20 NOTE — Discharge Instructions (Signed)
Bronchospasm, Adult A bronchospasm is when the tubes that carry air in and out of your lungs (airwarys) spasm or tighten. During a bronchospasm it is hard to breathe. This is because the airways get smaller. A bronchospasm can be triggered by:  Allergies. These may be to animals, pollen, food, or mold.  Infection. This is a common cause of bronchospasm.  Exercise.  Irritants. These include pollution, cigarette smoke, strong odors, aerosol sprays, and paint fumes.  Weather changes.  Stress.  Being emotional. HOME CARE   Always have a plan for getting help. Know when to call your doctor and local emergency services (911 in the [place] Know where you can get emergency care.  Only take medicines as told by your doctor.  If you were prescribed an inhaler or nebulizer machine, ask your doctor how to use it correctly. Always use a spacer with your inhaler if you were given one.  Stay calm during an attack. Try to relax and breathe more slowly.  Control your home environment:  Change your heating and air conditioning filter at least once a month.  Limit your use of fireplaces and wood stoves.  Do not  smoke. Do not  allow smoking in your home.  Avoid perfumes and fragrances.  Get rid of pests (such as roaches and mice) and their droppings.  Throw away plants if you see mold on them.  Keep your house clean and dust free.  Replace carpet with wood, tile, or vinyl flooring. Carpet can trap dander and dust.  Use allergy-proof pillows, mattress covers, and box spring covers.  Wash bed sheets and blankets every week in hot water. Dry them in a dryer.  Use blankets that are made of polyester or cotton.  Wash hands frequently. GET HELP IF:  You have muscle aches.  You have chest pain.  The thick spit you spit or cough up (sputum) changes from clear or white to yellow, green, gray, or bloody.  The thick spit you spit or cough up gets thicker.  There are problems that may be  related to the medicine you are given such as:  A rash.  Itching.  Swelling.  Trouble breathing. GET HELP RIGHT AWAY IF:  You feel you cannot breathe or catch your breath.  You cannot stop coughing.  Your treatment is not helping you breathe better. MAKE SURE YOU:   Understand these instructions.  Will watch your condition.  Will get help right away if you are not doing well or get worse. Document Released: 06/03/2009 Document Revised: 04/08/2013 Document Reviewed: 01/27/2013 [facility] Patient Information 2014 [place]. Chronic Obstructive Pulmonary Disease Chronic obstructive pulmonary disease (COPD) is a common lung condition in which airflow from the lungs is limited. COPD is a general term that can be used to describe many different lung problems that limit airflow, including both chronic bronchitis and emphysema. If you have COPD, your lung function will probably never return to normal, but there are measures you can take to improve lung function and make yourself feel better.  CAUSES   Smoking (common).   Exposure to secondhand smoke.   Genetic problems.  Chronic inflammatory lung diseases or recurrent infections. SYMPTOMS   Shortness of breath, especially with physical activity.   Deep, persistent (chronic) cough with a large amount of thick mucus.   Wheezing.   Rapid breaths (tachypnea).   Gray or bluish discoloration (cyanosis) of the skin, especially in fingers, toes, or lips.   Fatigue.   Weight loss.  Frequent infections or episodes when breathing symptoms become much worse (exacerbations).   Chest tightness. DIAGNOSIS  Your healthcare provider will take a medical history and perform a physical examination to make the initial diagnosis. Additional tests for COPD may include:   Lung (pulmonary) function tests.  Chest X-Aren Cherne.  CT scan.  Blood tests. TREATMENT  Treatment available to help you feel better when you have COPD  include:   Inhaler and nebulizer medicines. These help manage the symptoms of COPD and make your breathing more comfortable  Supplemental oxygen. Supplemental oxygen is only helpful if you have a low oxygen level in your blood.   Exercise and physical activity. These are beneficial for nearly all people with COPD. Some people may also benefit from a pulmonary rehabilitation program. HOME CARE INSTRUCTIONS   Take all medicines (inhaled or pills) as directed by your health care provider.  Only take over-the-counter or prescription medicines for pain, fever, or discomfort as directed by your health care provider.   Avoid over-the-counter medicines or cough syrups that dry up your airway (such as antihistamines) and slow down the elimination of secretions unless instructed otherwise by your healthcare provider.   If you are a smoker, the most important thing that you can do is stop smoking. Continuing to smoke will cause further lung damage and breathing trouble. Ask your health care provider for help with quitting smoking. He or she can direct you to community resources or hospitals that provide support.  Avoid exposure to irritants such as smoke, chemicals, and fumes that aggravate your breathing.  Use oxygen therapy and pulmonary rehabilitation if directed by your health care provider. If you require home oxygen therapy, ask your healthcare provider whether you should purchase a pulse oximeter to measure your oxygen level at home.   Avoid contact with individuals who have a contagious illness.  Avoid extreme temperature and humidity changes.  Eat healthy foods. Eating smaller, more frequent meals and resting before meals may help you maintain your strength.  Stay active, but balance activity with periods of rest. Exercise and physical activity will help you maintain your ability to do things you want to do.  Preventing infection and hospitalization is very important when you have  COPD. Make sure to receive all the vaccines your health care provider recommends, especially the pneumococcal and influenza vaccines. Ask your healthcare provider whether you need a pneumonia vaccine.  Learn and use relaxation techniques to manage stress.  Learn and use controlled breathing techniques as directed by your health care provider. Controlled breathing techniques include:   Pursed lip breathing. Start by breathing in (inhaling) through your nose for 1 second. Then, purse your lips as if you were going to whistle and breathe out (exhale) through the pursed lips for 2 seconds.   Diaphragmatic breathing. Start by putting one hand on your abdomen just above your waist. Inhale slowly through your nose. The hand on your abdomen should move out. Then purse your lips and exhale slowly. You should be able to feel the hand on your abdomen moving in as you exhale.   Learn and use controlled coughing to clear mucus from your lungs. Controlled coughing is a series of short, progressive coughs. The steps of controlled coughing are:  1. Lean your head slightly forward.  2. Breathe in deeply using diaphragmatic breathing.  3. Try to hold your breath for 3 seconds.  4. Keep your mouth slightly open while coughing twice.  5. Spit any mucus out into a tissue.  6. Rest and repeat the steps once or twice as needed. SEEK MEDICAL CARE IF:   You are coughing up more mucus than usual.   There is a change in the color or thickness of your mucus.   Your breathing is more labored than usual.   Your breathing is faster than usual.  SEEK IMMEDIATE MEDICAL CARE IF:   You have shortness of breath while you are resting.   You have shortness of breath that prevents you from:  Being able to talk.   Performing your usual physical activities.   You have chest pain lasting longer than 5 minutes.   Your skin color is more cyanotic than usual.  You measure low oxygen saturations for  longer than 5 minutes with a pulse oximeter. MAKE SURE YOU:   Understand these instructions.  Will watch your condition.  Will get help right away if you are not doing well or get worse. Document Released: 05/16/2005 Document Revised: 05/27/2013 Document Reviewed: 04/02/2013 [facility] Patient Information 2014 [place].

## 2013-09-20 NOTE — ED Notes (Signed)
Pt sts she had the Pneumonia and the Flu Vaccines in Dr. Lindwood Qua office this past November.

## 2013-09-20 NOTE — ED Notes (Signed)
SOB and wheezing upon awakening this morning.  Two Albuterol nebs at home that didn't help.

## 2013-09-20 NOTE — ED Provider Notes (Signed)
CSN: [id number]     Arrival date & time 09/20/13  0725 History   First MD Initiated Contact with Patient 09/20/13 0730     Chief Complaint  Patient presents with  . Shortness of Breath   (Consider location/radiation/quality/duration/timing/severity/associated sxs/prior Treatment) HPI 43 year old female with a history of pneumonia and COPD diagnosed with left lower lobe pneumonia most recently on 08/22/2013 treated with Levaquin and now Augmentin who presents today with worsened dyspnea, wheezing, and cough. She states that since she has been sick with the pneumonia diagnosed last month she has required albuterol up to every 2 hours. She has not been on any steroids for this. She quit smoking 3 years ago. She continues to cough with some discolored mucus. She has not had fever in the past several days. She woke up this morning and felt more dyspneic and usual and took 2 albuterol treatments at home. EMS was called and found her sats to be 92-93%. She was treated in route with another nebulizer. They state that she had diffuse wheezing which has improved some but they've noted that it has continued.  Past Medical History  Diagnosis Date  . Asthma   . Ulcerative colitis   . Anxiety   . Depression   . Chronic back pain   . Chronic knee pain   . Thyroid disease     hypo  . GERD (gastroesophageal reflux disease)   . Insomnia   . Allergic rhinitis   . Arthritis   . COPD (chronic obstructive pulmonary disease)    Past Surgical History  Procedure Laterality Date  . Knee surgery      x3, two on left and one on right.  Arthoscopy  . Cholecystectomy    . Abdominal hysterectomy    . Cesarean section    . Tonsillectomy    . Joint replacement      2x on left, 1x on right   Family History  Problem Relation Age of Onset  . High blood pressure Mother   . Bipolar disorder Mother   . Asthma Father   . Parkinson's disease Father    History  Substance Use Topics  . Smoking status: Former  Smoker -- 1.00 packs/day for 3 years    Types: Cigarettes    Quit date: 10/19/2011  . Smokeless tobacco: Never Used  . Alcohol Use: No   OB History   Grav Para Term Preterm Abortions TAB SAB Ect Mult Living                 Review of Systems  Respiratory: Positive for cough, shortness of breath and wheezing.   Gastrointestinal: Negative for vomiting.  Musculoskeletal: Positive for back pain.  All other systems reviewed and are negative.    Allergies  Penicillins  Home Medications   Current Outpatient Rx  Name  Route  Sig  Dispense  Refill  . albuterol (PROVENTIL HFA;VENTOLIN HFA) 108 (90 BASE) MCG/ACT inhaler   Inhalation   Inhale 2 puffs into the lungs every 6 (six) hours as needed for wheezing.         Marland Kitchen albuterol-ipratropium (COMBIVENT) 18-103 MCG/ACT inhaler   Inhalation   Inhale 2 puffs into the lungs every 6 (six) hours as needed for wheezing.   1 Inhaler   1   . ALPRAZolam (XANAX) 1 MG tablet   Oral   Take 1 mg by mouth 3 (three) times daily as needed for anxiety.         Marland Kitchen buPROPion [facility]  XL) 300 MG 24 hr tablet   Oral   Take 300 mg by mouth every morning.          . cholecalciferol (VITAMIN D) 1000 UNITS tablet   Oral   Take 1,000 Units by mouth every morning.         . citalopram (CELEXA) 40 MG tablet   Oral   Take 40 mg by mouth every morning.          . famotidine (PEPCID) 20 MG tablet   Oral   Take 20 mg by mouth 2 (two) times daily as needed for heartburn.         . ferrous sulfate 325 (65 FE) MG tablet   Oral   Take 1 tablet (325 mg total) by mouth 2 (two) times daily with a meal.   60 tablet   3   . Fluticasone-Salmeterol (ADVAIR) 500-50 MCG/DOSE AEPB   Inhalation   Inhale 1 puff into the lungs every 12 (twelve) hours.         . furosemide (LASIX) 20 MG tablet   Oral   Take 20 mg by mouth every morning.         . Ipratropium-Albuterol (COMBIVENT) 20-100 MCG/ACT AERS respimat   Inhalation   Inhale 2 puffs into  the lungs 2 (two) times daily.   1 Inhaler   0   . ipratropium-albuterol (DUONEB) 0.5-2.5 (3) MG/3ML SOLN   Nebulization   Take 3 mLs by nebulization every 6 (six) hours as needed (for wheezing).         Marland Kitchen levofloxacin (LEVAQUIN) 750 MG tablet   Oral   Take 1 tablet (750 mg total) by mouth daily. X 7 days   7 tablet   0   . levothyroxine (SYNTHROID, LEVOTHROID) 150 MCG tablet   Oral   Take 150 mcg by mouth daily before breakfast.         . montelukast (SINGULAIR) 10 MG tablet   Oral   Take 10 mg by mouth at bedtime.          Marland Kitchen morphine (MS CONTIN) 15 MG 12 hr tablet   Oral   Take 15 mg by mouth every 4 (four) hours as needed for pain.         Marland Kitchen morphine (MS CONTIN) 30 MG 12 hr tablet   Oral   Take 1 tablet (30 mg total) by mouth 2 (two) times daily.   60 tablet   0   . naproxen (NAPROSYN) 500 MG tablet   Oral   Take 1 tablet (500 mg total) by mouth 2 (two) times daily.   12 tablet   0   . omeprazole (PRILOSEC) 40 MG capsule   Oral   Take 40 mg by mouth every morning.          . ondansetron (ZOFRAN) 4 MG tablet   Oral   Take 1 tablet (4 mg total) by mouth every 6 (six) hours.   12 tablet   0   . promethazine (PHENERGAN) 25 MG tablet   Oral   Take 1 tablet (25 mg total) by mouth every 6 (six) hours as needed for nausea.   20 tablet   0   . traZODone (DESYREL) 100 MG tablet   Oral   Take 100 mg by mouth at bedtime as needed for sleep.           BP 116/51  Pulse 77  Temp(Src) 98.7 F (37.1 C) (Oral)  Resp  14  Ht 5\' 2"  (1.575 m)  Wt 210 lb (95.255 kg)  BMI 38.40 kg/m2  SpO2 98% Physical Exam  Nursing note and vitals reviewed. Constitutional: She is oriented to person, place, and time. She appears well-developed and well-nourished.  HENT:  Head: Normocephalic and atraumatic.  Right Ear: External ear normal.  Left Ear: External ear normal.  Nose: Nose normal.  Mouth/Throat: Oropharynx is clear and moist.  Eyes: Conjunctivae and EOM are  normal. Pupils are equal, round, and reactive to light.  Neck: Normal range of motion.  Cardiovascular: Normal rate, regular rhythm, normal heart sounds and intact distal pulses.   Pulmonary/Chest: She has wheezes. She has no rales. She exhibits no tenderness.  Abdominal: Soft.  Musculoskeletal: Normal range of motion.  Neurological: She is alert and oriented to person, place, and time. She has normal reflexes.  Skin: Skin is warm and dry.  Psychiatric: She has a normal mood and affect. Her behavior is normal. Judgment and thought content normal.    ED Course  Procedures (including critical care time) Labs Review Labs Reviewed - No data to display Imaging Review Dg Chest Port 1 View  09/20/2013   CLINICAL DATA:  Pneumonia, shortness of Breath  EXAM: PORTABLE CHEST - 1 VIEW  COMPARISON:  08/22/2013  FINDINGS: Improved aeration since previous exam with some residual linear opacities in both lung bases. Interval decrease in left pleural effusion. Persistent blunting of the left lateral costophrenic angle. Heart size upper limits normal for technique. No new infiltrate. .  Visualized skeletal structures are unremarkable.  IMPRESSION: Improving bibasilar aeration and decrease in left pleural effusion.   Electronically Signed   By: Arne Cleveland M.D.   On: 09/20/2013 08:02    EKG Interpretation   None       MDM  Patient with decreased wheezing after nebs.  Ambulated with sats at 94%.  Continues with some wheezing radiating from upper airway. Respiratory rate and effort normal. Compared previous cxr to today andimproving.  Plan prednisone and continue nebs.  Patient observed after last neb x 75 minutes and improving.  Discharged home with strict return precautions.   Shaune Pollack, MD 09/20/13 1009

## 2013-09-20 NOTE — ED Notes (Addendum)
Patient ambulated in hall on room air, SpO2 94% after return to room.  RR 14-18, HR 78.  NCO2 d/c'd

## 2013-09-20 NOTE — ED Notes (Signed)
MD at bedside. 

## 2013-09-21 NOTE — Progress Notes (Signed)
   CARE MANAGEMENT NOTE 09/21/2013  Patient:  Debra [name]   Account Number:  [id number]  Date Initiated:  09/21/2013  Documentation initiated by:  [facility]  Subjective/Objective Assessment:   pneumonia and COPD     Action/Plan:   lives at home with family   Anticipated DC Date:  09/20/2013   Anticipated DC Plan:  [place]  CM consult  Medication Assistance      Choice offered to / List presented to:             Status of service:  Completed, signed off Medicare Important Message given?   (If response is "NO", the following Medicare IM given date fields will be blank) Date Medicare IM given:   Date Additional Medicare IM given:    Discharge Disposition:  HOME/SELF CARE  Per UR Regulation:    If discussed at Long Length of Stay Meetings, dates discussed:    Comments:  09/21/2013 1155 NCM spoke to pt and states she has appt with Dr. Holley Raring at 3:15 today. States Atrovent is $300 dollars at her pharmacy and she is unable to afford medication. Explained to pt she can discuss with PCP about other new medications that may offer an assistance program were she can complete application and receive med for free. States Dr Holley Raring did referral to COPD specialist at [facility] but states the copay is $300 out of pocket and she cannot afford. Pt requested chest xray be faxed to Dr. Lindwood Qua office before her appt. Fax number is # [phone]. Pt will follow with NCM if she receives a Rx for medication that may be covered through an assistance program and NCM will mail her application. States she does not have computer at home. Explained she can always go online to [email or web address] for coupons for her medications.  Debra Finner RN CCM Case Mgmt phone [phone]

## 2013-10-26 ENCOUNTER — Emergency Department (HOSPITAL_COMMUNITY)
Admission: EM | Admit: 2013-10-26 | Discharge: 2013-10-26 | Disposition: A | Discharge status: Home / Self Care | Payer: Self-pay | Attending: Emergency Medicine | Admitting: Emergency Medicine

## 2013-10-26 ENCOUNTER — Encounter (HOSPITAL_COMMUNITY): Payer: Self-pay | Admitting: Emergency Medicine

## 2013-10-26 ENCOUNTER — Emergency Department (HOSPITAL_COMMUNITY): Payer: Self-pay

## 2013-10-26 DIAGNOSIS — F411 Generalized anxiety disorder: Secondary | ICD-10-CM | POA: Insufficient documentation

## 2013-10-26 DIAGNOSIS — R599 Enlarged lymph nodes, unspecified: Secondary | ICD-10-CM | POA: Insufficient documentation

## 2013-10-26 DIAGNOSIS — J449 Chronic obstructive pulmonary disease, unspecified: Secondary | ICD-10-CM | POA: Insufficient documentation

## 2013-10-26 DIAGNOSIS — E669 Obesity, unspecified: Secondary | ICD-10-CM | POA: Insufficient documentation

## 2013-10-26 DIAGNOSIS — K219 Gastro-esophageal reflux disease without esophagitis: Secondary | ICD-10-CM | POA: Insufficient documentation

## 2013-10-26 DIAGNOSIS — F1010 Alcohol abuse, uncomplicated: Secondary | ICD-10-CM | POA: Insufficient documentation

## 2013-10-26 DIAGNOSIS — IMO0002 Reserved for concepts with insufficient information to code with codable children: Secondary | ICD-10-CM | POA: Insufficient documentation

## 2013-10-26 DIAGNOSIS — R112 Nausea with vomiting, unspecified: Secondary | ICD-10-CM | POA: Insufficient documentation

## 2013-10-26 DIAGNOSIS — M129 Arthropathy, unspecified: Secondary | ICD-10-CM | POA: Insufficient documentation

## 2013-10-26 DIAGNOSIS — Z87891 Personal history of nicotine dependence: Secondary | ICD-10-CM | POA: Insufficient documentation

## 2013-10-26 DIAGNOSIS — J4489 Other specified chronic obstructive pulmonary disease: Secondary | ICD-10-CM | POA: Insufficient documentation

## 2013-10-26 DIAGNOSIS — R935 Abnormal findings on diagnostic imaging of other abdominal regions, including retroperitoneum: Secondary | ICD-10-CM | POA: Insufficient documentation

## 2013-10-26 DIAGNOSIS — Z79899 Other long term (current) drug therapy: Secondary | ICD-10-CM | POA: Insufficient documentation

## 2013-10-26 DIAGNOSIS — R590 Localized enlarged lymph nodes: Secondary | ICD-10-CM

## 2013-10-26 DIAGNOSIS — M549 Dorsalgia, unspecified: Secondary | ICD-10-CM | POA: Insufficient documentation

## 2013-10-26 DIAGNOSIS — Z880 Allergy status to penicillin: Secondary | ICD-10-CM | POA: Insufficient documentation

## 2013-10-26 DIAGNOSIS — F329 Major depressive disorder, single episode, unspecified: Secondary | ICD-10-CM | POA: Insufficient documentation

## 2013-10-26 DIAGNOSIS — F3289 Other specified depressive episodes: Secondary | ICD-10-CM | POA: Insufficient documentation

## 2013-10-26 DIAGNOSIS — M25569 Pain in unspecified knee: Secondary | ICD-10-CM | POA: Insufficient documentation

## 2013-10-26 DIAGNOSIS — G8929 Other chronic pain: Secondary | ICD-10-CM | POA: Insufficient documentation

## 2013-10-26 DIAGNOSIS — E079 Disorder of thyroid, unspecified: Secondary | ICD-10-CM | POA: Insufficient documentation

## 2013-10-26 LAB — ETHANOL: Alcohol, Ethyl (B): 134 mg/dL — ABNORMAL HIGH (ref 0–11)

## 2013-10-26 LAB — COMPREHENSIVE METABOLIC PANEL
ALBUMIN: 3.3 g/dL — AB (ref 3.5–5.2)
ALK PHOS: 120 U/L — AB (ref 39–117)
ALT: 32 U/L (ref 0–35)
AST: 105 U/L — ABNORMAL HIGH (ref 0–37)
BILIRUBIN TOTAL: 0.7 mg/dL (ref 0.3–1.2)
BUN: 3 mg/dL — AB (ref 6–23)
CHLORIDE: 99 meq/L (ref 96–112)
CO2: 26 mEq/L (ref 19–32)
Calcium: 9 mg/dL (ref 8.4–10.5)
Creatinine, Ser: 0.93 mg/dL (ref 0.50–1.10)
GFR calc Af Amer: 85 mL/min — ABNORMAL LOW (ref >90)
GFR calc non Af Amer: 74 mL/min — ABNORMAL LOW (ref >90)
Glucose, Bld: 103 mg/dL — ABNORMAL HIGH (ref 70–99)
POTASSIUM: 3 meq/L — AB (ref 3.7–5.3)
Sodium: 141 mEq/L (ref 137–147)
Total Protein: 6.9 g/dL (ref 6.0–8.3)

## 2013-10-26 LAB — CBC WITH DIFFERENTIAL/PLATELET
BASOS PCT: 0 % (ref 0–1)
Basophils Absolute: 0 10*3/uL (ref 0.0–0.1)
Eosinophils Absolute: 0.8 10*3/uL — ABNORMAL HIGH (ref 0.0–0.7)
Eosinophils Relative: 13 % — ABNORMAL HIGH (ref 0–5)
HCT: 30.4 % — ABNORMAL LOW (ref 36.0–46.0)
HEMOGLOBIN: 9.4 g/dL — AB (ref 12.0–15.0)
Lymphocytes Relative: 32 % (ref 12–46)
Lymphs Abs: 1.9 10*3/uL (ref 0.7–4.0)
MCH: 24.3 pg — ABNORMAL LOW (ref 26.0–34.0)
MCHC: 30.9 g/dL (ref 30.0–36.0)
MCV: 78.6 fL (ref 78.0–100.0)
MONOS PCT: 9 % (ref 3–12)
Monocytes Absolute: 0.5 10*3/uL (ref 0.1–1.0)
NEUTROS ABS: 2.7 10*3/uL (ref 1.7–7.7)
Neutrophils Relative %: 46 % (ref 43–77)
Platelets: 213 10*3/uL (ref 150–400)
RBC: 3.87 MIL/uL (ref 3.87–5.11)
RDW: 16.6 % — ABNORMAL HIGH (ref 11.5–15.5)
WBC: 5.9 10*3/uL (ref 4.0–10.5)

## 2013-10-26 LAB — LIPASE, BLOOD: Lipase: 16 U/L (ref 11–59)

## 2013-10-26 MED ORDER — OXYCODONE-ACETAMINOPHEN 5-325 MG PO TABS: 1.0000 | ORAL_TABLET | Freq: Four times a day (QID) | ORAL | Status: DC | PRN

## 2013-10-26 MED ORDER — SODIUM CHLORIDE 0.9 % IV SOLN: Freq: Once | INTRAVENOUS | Status: DC

## 2013-10-26 MED ORDER — SODIUM CHLORIDE 0.9 % IV BOLUS (SEPSIS)
1000.0000 mL | Freq: Once | INTRAVENOUS | Status: AC
  Administered 2013-10-26: 1000 mL via INTRAVENOUS

## 2013-10-26 MED ORDER — IOHEXOL 300 MG/ML  SOLN
50.0000 mL | Freq: Once | INTRAMUSCULAR | Status: AC | PRN
  Administered 2013-10-26: 50 mL via ORAL

## 2013-10-26 MED ORDER — PROMETHAZINE HCL 25 MG PO TABS: 25.0000 mg | ORAL_TABLET | Freq: Four times a day (QID) | ORAL | Status: DC | PRN

## 2013-10-26 MED ORDER — SODIUM CHLORIDE 0.9 % IV BOLUS (SEPSIS): 1000.0000 mL | Freq: Once | INTRAVENOUS | Status: DC

## 2013-10-26 MED ORDER — ONDANSETRON HCL 4 MG/2ML IJ SOLN
4.0000 mg | Freq: Once | INTRAMUSCULAR | Status: AC
  Administered 2013-10-26: 4 mg via INTRAVENOUS
  Filled 2013-10-26: qty 2

## 2013-10-26 MED ORDER — FENTANYL CITRATE 0.05 MG/ML IJ SOLN
50.0000 ug | Freq: Once | INTRAMUSCULAR | Status: AC
  Administered 2013-10-26: 50 ug via INTRAVENOUS
  Filled 2013-10-26: qty 2

## 2013-10-26 MED ORDER — DICYCLOMINE HCL 10 MG/ML IM SOLN
20.0000 mg | Freq: Once | INTRAMUSCULAR | Status: AC
  Administered 2013-10-26: 20 mg via INTRAMUSCULAR
  Filled 2013-10-26: qty 2

## 2013-10-26 MED ORDER — HYDROMORPHONE HCL PF 1 MG/ML IJ SOLN
1.0000 mg | Freq: Once | INTRAMUSCULAR | Status: DC
  Filled 2013-10-26: qty 1

## 2013-10-26 MED ORDER — OXYMETAZOLINE HCL 0.05 % NA SOLN
1.0000 | Freq: Once | NASAL | Status: AC
  Administered 2013-10-26: 1 via NASAL
  Filled 2013-10-26: qty 15

## 2013-10-26 MED ORDER — HYDROMORPHONE HCL PF 1 MG/ML IJ SOLN
1.0000 mg | Freq: Once | INTRAMUSCULAR | Status: AC
  Administered 2013-10-26: 1 mg via INTRAVENOUS
  Filled 2013-10-26: qty 1

## 2013-10-26 MED ORDER — FENTANYL CITRATE 0.05 MG/ML IJ SOLN
100.0000 ug | Freq: Once | INTRAMUSCULAR | Status: AC
  Administered 2013-10-26: 100 ug via INTRAVENOUS
  Filled 2013-10-26: qty 2

## 2013-10-26 MED ORDER — IOHEXOL 300 MG/ML  SOLN
100.0000 mL | Freq: Once | INTRAMUSCULAR | Status: AC | PRN
  Administered 2013-10-26: 100 mL via INTRAVENOUS

## 2013-10-26 MED ORDER — LIP MEDEX EX OINT
TOPICAL_OINTMENT | Freq: Once | CUTANEOUS | Status: AC
Start: 2013-10-26 — End: 2013-10-26
  Administered 2013-10-26: 1 via TOPICAL
  Filled 2013-10-26: qty 7

## 2013-10-26 NOTE — ED Notes (Signed)
Pt unable to locate ride home at this time.

## 2013-10-26 NOTE — Discharge Instructions (Signed)
Lymphadenopathy °Lymphadenopathy means "disease of the lymph glands." But the term is usually used to describe swollen or enlarged lymph glands, also called lymph nodes. These are the bean-shaped organs found in many locations including the neck, underarm, and groin. Lymph glands are part of the immune system, which fights infections in your body. Lymphadenopathy can occur in just one area of the body, such as the neck, or it can be generalized, with lymph node enlargement in several areas. The nodes found in the neck are the most common sites of lymphadenopathy. °CAUSES  °When your immune system responds to germs (such as viruses or bacteria ), infection-fighting cells and fluid build up. This causes the glands to grow in size. This is usually not something to worry about. Sometimes, the glands themselves can become infected and inflamed. This is called lymphadenitis. °Enlarged lymph nodes can be caused by many diseases: °· Bacterial disease, such as strep throat or a skin infection. °· Viral disease, such as a common cold. °· Other germs, such as lyme disease, tuberculosis, or sexually transmitted diseases. °· Cancers, such as lymphoma (cancer of the lymphatic system) or leukemia (cancer of the white blood cells). °· Inflammatory diseases such as lupus or rheumatoid arthritis. °· Reactions to medications. °Many of the diseases above are rare, but important. This is why you should see your caregiver if you have lymphadenopathy. °SYMPTOMS  °· Swollen, enlarged lumps in the neck, back of the head or other locations. °· Tenderness. °· Warmth or redness of the skin over the lymph nodes. °· Fever. °DIAGNOSIS  °Enlarged lymph nodes are often near the source of infection. They can help healthcare providers diagnose your illness. For instance:  °· Swollen lymph nodes around the jaw might be caused by an infection in the mouth. °· Enlarged glands in the neck often signal a throat infection. °· Lymph nodes that are swollen  in more than one area often indicate an illness caused by a virus. °Your caregiver most likely will know what is causing your lymphadenopathy after listening to your history and examining you. Blood tests, x-rays or other tests may be needed. If the cause of the enlarged lymph node cannot be found, and it does not go away by itself, then a biopsy may be needed. Your caregiver will discuss this with you. °TREATMENT  °Treatment for your enlarged lymph nodes will depend on the cause. Many times the nodes will shrink to normal size by themselves, with no treatment. Antibiotics or other medicines may be needed for infection. Only take over-the-counter or prescription medicines for pain, discomfort or fever as directed by your caregiver. °HOME CARE INSTRUCTIONS  °Swollen lymph glands usually return to normal when the underlying medical condition goes away. If they persist, contact your health-care provider. He/she might prescribe antibiotics or other treatments, depending on the diagnosis. Take any medications exactly as prescribed. Keep any follow-up appointments made to check on the condition of your enlarged nodes.  °SEEK MEDICAL CARE IF:  °· Swelling lasts for more than two weeks. °· You have symptoms such as weight loss, night sweats, fatigue or fever that does not go away. °· The lymph nodes are hard, seem fixed to the skin or are growing rapidly. °· Skin over the lymph nodes is red and inflamed. This could mean there is an infection. °SEEK IMMEDIATE MEDICAL CARE IF:  °· Fluid starts leaking from the area of the enlarged lymph node. °· You develop a fever of 102° F (38.9° C) or greater. °· Severe   pain develops (not necessarily at the site of a large lymph node).  You develop chest pain or shortness of breath.  You develop worsening abdominal pain. MAKE SURE YOU:   Understand these instructions.  Will watch your condition.  Will get help right away if you are not doing well or get worse. Document  Released: 05/15/2008 Document Revised: 10/29/2011 Document Reviewed: 05/15/2008 [facility] Patient Information 2014 [place]. As discussed you have abnormal findings on your CT scan that need to be followed up this week in the GYN clinic at [facility] Call and make an appointment  You have been given prescriptions for pain and nausea

## 2013-10-26 NOTE — ED Provider Notes (Signed)
CSN: [id number]     Arrival date & time 10/26/13  0036 History   First MD Initiated Contact with Patient 10/26/13 0110     Chief Complaint  Patient presents with  . Abdominal Pain     (Consider location/radiation/quality/duration/timing/severity/associated sxs/prior Treatment) Patient is a 43 y.o. female presenting with abdominal pain. The history is provided by the patient.  Abdominal Pain Pain location:  LUQ, LLQ and suprapubic Pain quality: aching, cramping, shooting and squeezing   Pain severity:  Severe Onset quality:  Gradual Duration:  2 days Timing:  Constant Progression:  Worsening Chronicity:  Recurrent Context: alcohol use   Context: not laxative use and not trauma   Relieved by:  None tried Worsened by:  Nothing tried Ineffective treatments:  None tried Associated symptoms: nausea and vomiting   Associated symptoms: no chills, no constipation, no cough, no diarrhea, no dysuria, no fever, no vaginal bleeding and no vaginal discharge   Risk factors: alcohol abuse and multiple surgeries     Past Medical History  Diagnosis Date  . Asthma   . Ulcerative colitis   . Anxiety   . Depression   . Chronic back pain   . Chronic knee pain   . Thyroid disease     hypo  . GERD (gastroesophageal reflux disease)   . Insomnia   . Allergic rhinitis   . Arthritis   . COPD (chronic obstructive pulmonary disease)    Past Surgical History  Procedure Laterality Date  . Knee surgery      x3, two on left and one on right.  Arthoscopy  . Cholecystectomy    . Abdominal hysterectomy    . Cesarean section    . Tonsillectomy    . Joint replacement      2x on left, 1x on right   Family History  Problem Relation Age of Onset  . High blood pressure Mother   . Bipolar disorder Mother   . Asthma Father   . Parkinson's disease Father    History  Substance Use Topics  . Smoking status: Former Smoker -- 1.00 packs/day for 3 years    Types: Cigarettes    Quit date: 10/19/2011    . Smokeless tobacco: Never Used  . Alcohol Use: Yes     Comment: rarely    OB History   Grav Para Term Preterm Abortions TAB SAB Ect Mult Living                 Review of Systems  Constitutional: Negative for fever and chills.  Respiratory: Negative for cough.   Gastrointestinal: Positive for nausea, vomiting and abdominal pain. Negative for diarrhea and constipation.  Genitourinary: Negative for dysuria, vaginal bleeding, vaginal discharge and vaginal pain.  Musculoskeletal: Positive for back pain.  All other systems reviewed and are negative.      Allergies  Penicillins  Home Medications   Current Outpatient Rx  Name  Route  Sig  Dispense  Refill  . albuterol (PROVENTIL HFA;VENTOLIN HFA) 108 (90 BASE) MCG/ACT inhaler   Inhalation   Inhale 2 puffs into the lungs every 6 (six) hours as needed for wheezing.         Marland Kitchen albuterol-ipratropium (COMBIVENT) 18-103 MCG/ACT inhaler   Inhalation   Inhale 2 puffs into the lungs every 6 (six) hours as needed for wheezing.   1 Inhaler   1   . ALPRAZolam (XANAX) 1 MG tablet   Oral   Take 0.5 mg by mouth 3 (three) times daily  as needed for anxiety.          Marland Kitchen buPROPion (WELLBUTRIN XL) 300 MG 24 hr tablet   Oral   Take 300 mg by mouth every morning.          . cholecalciferol (VITAMIN D) 1000 UNITS tablet   Oral   Take 1,000 Units by mouth every morning.         . citalopram (CELEXA) 40 MG tablet   Oral   Take 40 mg by mouth every morning.          . famotidine (PEPCID) 20 MG tablet   Oral   Take 20 mg by mouth 2 (two) times daily as needed for heartburn.         . Fluticasone-Salmeterol (ADVAIR) 500-50 MCG/DOSE AEPB   Inhalation   Inhale 1 puff into the lungs every 12 (twelve) hours.         . furosemide (LASIX) 20 MG tablet   Oral   Take 20 mg by mouth every morning.         Marland Kitchen ipratropium-albuterol (DUONEB) 0.5-2.5 (3) MG/3ML SOLN   Nebulization   Take 3 mLs by nebulization every 6 (six) hours as  needed (for wheezing).         Marland Kitchen levothyroxine (SYNTHROID, LEVOTHROID) 150 MCG tablet   Oral   Take 150 mcg by mouth daily before breakfast.         . montelukast (SINGULAIR) 10 MG tablet   Oral   Take 10 mg by mouth at bedtime.          Marland Kitchen morphine (MS CONTIN) 15 MG 12 hr tablet   Oral   Take 15 mg by mouth every 4 (four) hours as needed for pain.         Marland Kitchen morphine (MS CONTIN) 30 MG 12 hr tablet   Oral   Take 1 tablet (30 mg total) by mouth 2 (two) times daily.   60 tablet   0   . naproxen (NAPROSYN) 500 MG tablet   Oral   Take 1 tablet (500 mg total) by mouth 2 (two) times daily.   12 tablet   0   . omeprazole (PRILOSEC) 40 MG capsule   Oral   Take 40 mg by mouth every morning.          . ondansetron (ZOFRAN) 4 MG tablet   Oral   Take 1 tablet (4 mg total) by mouth every 6 (six) hours.   12 tablet   0   . traZODone (DESYREL) 100 MG tablet   Oral   Take 100 mg by mouth at bedtime as needed for sleep.          Marland Kitchen oxyCODONE-acetaminophen (PERCOCET/ROXICET) 5-325 MG per tablet   Oral   Take 1-2 tablets by mouth every 6 (six) hours as needed for severe pain.   30 tablet   0   . promethazine (PHENERGAN) 25 MG tablet   Oral   Take 1 tablet (25 mg total) by mouth every 6 (six) hours as needed for nausea.   20 tablet   0    BP 115/61  Pulse 68  Temp(Src) 98.2 F (36.8 C) (Oral)  Resp 16  SpO2 99% Physical Exam  Vitals reviewed. Constitutional: She appears well-developed and well-nourished.  obese  HENT:  Head: Normocephalic.  Eyes: Pupils are equal, round, and reactive to light.  Neck: Normal range of motion.  Cardiovascular: Normal rate and regular rhythm.   Pulmonary/Chest: Effort  normal.  Abdominal: Soft. She exhibits no distension. There is tenderness in the suprapubic area, left upper quadrant and left lower quadrant. There is guarding. There is no rigidity.      ED Course  Procedures (including critical care time) Labs Review Labs  Reviewed  CBC WITH DIFFERENTIAL - Abnormal; Notable for the following:    Hemoglobin 9.4 (*)    HCT 30.4 (*)    MCH 24.3 (*)    RDW 16.6 (*)    Eosinophils Relative 13 (*)    Eosinophils Absolute 0.8 (*)    All other components within normal limits  COMPREHENSIVE METABOLIC PANEL - Abnormal; Notable for the following:    Potassium 3.0 (*)    Glucose, Bld 103 (*)    BUN 3 (*)    Albumin 3.3 (*)    AST 105 (*)    Alkaline Phosphatase 120 (*)    GFR calc non Af Amer 74 (*)    GFR calc Af Amer 85 (*)    All other components within normal limits  ETHANOL - Abnormal; Notable for the following:    Alcohol, Ethyl (B) 134 (*)    All other components within normal limits  LIPASE, BLOOD   Imaging Review Ct Abdomen Pelvis W Contrast  10/26/2013   CLINICAL DATA:  Left lower quadrant pain, ETOH, ulcerative colitis, diverticulitis  EXAM: CT ABDOMEN AND PELVIS WITH CONTRAST  TECHNIQUE: Multidetector CT imaging of the abdomen and pelvis was performed using the standard protocol following bolus administration of intravenous contrast.  CONTRAST:  186mL OMNIPAQUE IOHEXOL 300 MG/ML  SOLN  COMPARISON:  11/22/2000 CT report (images are not available)  FINDINGS: Multifocal clustered nodular opacities within the lower lobes, incompletely imaged. Normal heart size. Small hiatal hernia.  No appreciable abnormality of the liver. Cholecystectomy. No biliary ductal dilatation. Unremarkable spleen, pancreas, adrenal glands.  Symmetric renal enhancement. Slight fullness of the right ureter to the level of the common iliac arteries/retroperitoneal lymphadenopathy.  Decompressed distal colon limits evaluation. No overt colitis. No bowel obstruction. Appendix normal. Small bowel loops are of normal course and caliber.  No free intraperitoneal air. Small amount of free fluid predominantly collects dependently within the pelvis.  Normal caliber aorta and branch vessels. Retroperitoneal adenopathy as index; Aortocaval lymph node  measuring 1.4 cm short axis on image 29. Right common iliac chain lymph node measuring 11 mm short axis on image 43.  Tubular cystic structure right adnexa. A 5 mm rounded density dependently within the pelvis and an additional area abutting the serosal surface of the sigmoid colon on image 67. Other rounded areas are inseparable from the ovaries or vaginal cuff (image 73). Thin walled bladder.  No acute osseous finding.  IMPRESSION: Clustered nodular opacities within the lower lobes are favored to reflect an infectious/inflammatory process or aspiration pneumonitis. Metastatic disease not excluded.  Retroperitoneal lymphadenopathy. Metastatic disease is not excluded. Slight fullness of the right ureter to the level of the retroperitoneal adenopathy.  Tubular cystic density right adnexa suggests hydrosalpinx. Correlate clinically if concerned for infection.  Small amount of free fluid within the pelvis may be reactive. A few small rounded nodular densities within the pelvis for which metastatic peritoneal implants or endometriosis is not excluded in the appropriate clinical setting. Recommend comparison with prior imaging (none available in our system).  Findings and recommendations discussed via telephone with Junius Creamer at 6:10 a.m. on 10/26/2013.   Electronically Signed   By: Carlos Levering M.D.   On: 10/26/2013 06:20  EKG Interpretation None     CT scan concerning   Discussed with GYN  Recommend FU this week in clinic  Will dc home with Percocet and Phenergan  Discussed this with patinet  MDM   Final diagnoses:  Retroperitoneal lymphadenopathy         Garald Balding, NP 10/26/13 [phone]

## 2013-10-26 NOTE — ED Notes (Signed)
Pt presents with c/o abdominal pain, hx of cholitis. Per EMS pt reports pain on the left lower side. EMS witnessed pt putting her fingers down her throat to make herself vomit. Pt's family on scene reported that pt drank about 1/3 of a bottle of vodka. Per EMS, family states that she has a hx of abusing her medications but family denies this.

## 2013-10-26 NOTE — ED Notes (Signed)
Bed: ZJ09 Expected date:  Expected time:  Means of arrival:  Comments: EMS/abd pain/hx colitis/ETOH

## 2013-10-27 NOTE — ED Provider Notes (Signed)
Medical screening examination/treatment/procedure(s) were conducted as a shared visit with non-physician practitioner(s) and myself.  I personally evaluated the patient during the encounter.   EKG Interpretation None     Pt with h/o chronic abd pain, but CT scan today shows concerning findings for possible GYN cancer.  Pain in low back, llq, hips, but abn more right adnexa, lymphadenopathy).  Case discussed with oncall gyn who recommends ASAP clinic visit with [facility], and possible gyn onc referral from there.  Pt updated on findings and plan.  Kalman Drape, MD 10/27/13 1200

## 2013-11-16 ENCOUNTER — Emergency Department (HOSPITAL_BASED_OUTPATIENT_CLINIC_OR_DEPARTMENT_OTHER): Payer: Self-pay

## 2013-11-16 ENCOUNTER — Emergency Department (HOSPITAL_BASED_OUTPATIENT_CLINIC_OR_DEPARTMENT_OTHER)
Admission: EM | Admit: 2013-11-16 | Discharge: 2013-11-16 | Disposition: A | Discharge status: Home / Self Care | Payer: Self-pay | Attending: Emergency Medicine | Admitting: Emergency Medicine

## 2013-11-16 ENCOUNTER — Encounter (HOSPITAL_BASED_OUTPATIENT_CLINIC_OR_DEPARTMENT_OTHER): Payer: Self-pay | Admitting: Emergency Medicine

## 2013-11-16 DIAGNOSIS — Z9089 Acquired absence of other organs: Secondary | ICD-10-CM | POA: Insufficient documentation

## 2013-11-16 DIAGNOSIS — R1031 Right lower quadrant pain: Secondary | ICD-10-CM | POA: Insufficient documentation

## 2013-11-16 DIAGNOSIS — K219 Gastro-esophageal reflux disease without esophagitis: Secondary | ICD-10-CM | POA: Insufficient documentation

## 2013-11-16 DIAGNOSIS — G4700 Insomnia, unspecified: Secondary | ICD-10-CM | POA: Insufficient documentation

## 2013-11-16 DIAGNOSIS — R1011 Right upper quadrant pain: Secondary | ICD-10-CM | POA: Insufficient documentation

## 2013-11-16 DIAGNOSIS — Z880 Allergy status to penicillin: Secondary | ICD-10-CM | POA: Insufficient documentation

## 2013-11-16 DIAGNOSIS — R599 Enlarged lymph nodes, unspecified: Secondary | ICD-10-CM | POA: Insufficient documentation

## 2013-11-16 DIAGNOSIS — J189 Pneumonia, unspecified organism: Secondary | ICD-10-CM | POA: Insufficient documentation

## 2013-11-16 DIAGNOSIS — Z9889 Other specified postprocedural states: Secondary | ICD-10-CM | POA: Insufficient documentation

## 2013-11-16 DIAGNOSIS — Z90710 Acquired absence of both cervix and uterus: Secondary | ICD-10-CM | POA: Insufficient documentation

## 2013-11-16 DIAGNOSIS — E669 Obesity, unspecified: Secondary | ICD-10-CM | POA: Insufficient documentation

## 2013-11-16 DIAGNOSIS — R109 Unspecified abdominal pain: Secondary | ICD-10-CM

## 2013-11-16 DIAGNOSIS — R590 Localized enlarged lymph nodes: Secondary | ICD-10-CM

## 2013-11-16 DIAGNOSIS — J449 Chronic obstructive pulmonary disease, unspecified: Secondary | ICD-10-CM | POA: Insufficient documentation

## 2013-11-16 DIAGNOSIS — R638 Other symptoms and signs concerning food and fluid intake: Secondary | ICD-10-CM | POA: Insufficient documentation

## 2013-11-16 DIAGNOSIS — F411 Generalized anxiety disorder: Secondary | ICD-10-CM | POA: Insufficient documentation

## 2013-11-16 DIAGNOSIS — F3289 Other specified depressive episodes: Secondary | ICD-10-CM | POA: Insufficient documentation

## 2013-11-16 DIAGNOSIS — J4489 Other specified chronic obstructive pulmonary disease: Secondary | ICD-10-CM | POA: Insufficient documentation

## 2013-11-16 DIAGNOSIS — G8929 Other chronic pain: Secondary | ICD-10-CM | POA: Insufficient documentation

## 2013-11-16 DIAGNOSIS — Z79899 Other long term (current) drug therapy: Secondary | ICD-10-CM | POA: Insufficient documentation

## 2013-11-16 DIAGNOSIS — M129 Arthropathy, unspecified: Secondary | ICD-10-CM | POA: Insufficient documentation

## 2013-11-16 DIAGNOSIS — E039 Hypothyroidism, unspecified: Secondary | ICD-10-CM | POA: Insufficient documentation

## 2013-11-16 DIAGNOSIS — F329 Major depressive disorder, single episode, unspecified: Secondary | ICD-10-CM | POA: Insufficient documentation

## 2013-11-16 DIAGNOSIS — Z87891 Personal history of nicotine dependence: Secondary | ICD-10-CM | POA: Insufficient documentation

## 2013-11-16 LAB — URINALYSIS, ROUTINE W REFLEX MICROSCOPIC
Bilirubin Urine: NEGATIVE
GLUCOSE, UA: NEGATIVE mg/dL
HGB URINE DIPSTICK: NEGATIVE
Ketones, ur: NEGATIVE mg/dL
LEUKOCYTES UA: NEGATIVE
NITRITE: NEGATIVE
PH: 5.5 (ref 5.0–8.0)
PROTEIN: NEGATIVE mg/dL
SPECIFIC GRAVITY, URINE: 1.007 (ref 1.005–1.030)
Urobilinogen, UA: 0.2 mg/dL (ref 0.0–1.0)

## 2013-11-16 LAB — LIPASE, BLOOD: LIPASE: 14 U/L (ref 11–59)

## 2013-11-16 LAB — CBC
HEMATOCRIT: 31.3 % — AB (ref 36.0–46.0)
Hemoglobin: 9.4 g/dL — ABNORMAL LOW (ref 12.0–15.0)
MCH: 24.5 pg — AB (ref 26.0–34.0)
MCHC: 30 g/dL (ref 30.0–36.0)
MCV: 81.7 fL (ref 78.0–100.0)
PLATELETS: 231 10*3/uL (ref 150–400)
RBC: 3.83 MIL/uL — ABNORMAL LOW (ref 3.87–5.11)
RDW: 16.9 % — AB (ref 11.5–15.5)
WBC: 6 10*3/uL (ref 4.0–10.5)

## 2013-11-16 LAB — COMPREHENSIVE METABOLIC PANEL
ALBUMIN: 3.3 g/dL — AB (ref 3.5–5.2)
ALT: 12 U/L (ref 0–35)
AST: 15 U/L (ref 0–37)
Alkaline Phosphatase: 75 U/L (ref 39–117)
BUN: 6 mg/dL (ref 6–23)
CALCIUM: 9 mg/dL (ref 8.4–10.5)
CO2: 24 mEq/L (ref 19–32)
Chloride: 105 mEq/L (ref 96–112)
Creatinine, Ser: 0.9 mg/dL (ref 0.50–1.10)
GFR calc Af Amer: 89 mL/min — ABNORMAL LOW (ref >90)
GFR calc non Af Amer: 77 mL/min — ABNORMAL LOW (ref >90)
Glucose, Bld: 105 mg/dL — ABNORMAL HIGH (ref 70–99)
Potassium: 4.1 mEq/L (ref 3.7–5.3)
Sodium: 143 mEq/L (ref 137–147)
Total Bilirubin: 0.4 mg/dL (ref 0.3–1.2)
Total Protein: 7.4 g/dL (ref 6.0–8.3)

## 2013-11-16 MED ORDER — ONDANSETRON HCL 4 MG/2ML IJ SOLN
4.0000 mg | Freq: Once | INTRAMUSCULAR | Status: AC
  Administered 2013-11-16: 4 mg via INTRAVENOUS
  Filled 2013-11-16: qty 2

## 2013-11-16 MED ORDER — KETOROLAC TROMETHAMINE 30 MG/ML IJ SOLN
30.0000 mg | Freq: Once | INTRAMUSCULAR | Status: DC
  Filled 2013-11-16: qty 1

## 2013-11-16 MED ORDER — MORPHINE SULFATE 4 MG/ML IJ SOLN
4.0000 mg | Freq: Once | INTRAMUSCULAR | Status: AC
  Administered 2013-11-16: 4 mg via INTRAVENOUS
  Filled 2013-11-16: qty 1

## 2013-11-16 MED ORDER — SODIUM CHLORIDE 0.9 % IV BOLUS (SEPSIS)
1000.0000 mL | Freq: Once | INTRAVENOUS | Status: AC
  Administered 2013-11-16: 1000 mL via INTRAVENOUS

## 2013-11-16 MED ORDER — AZITHROMYCIN 250 MG PO TABS: ORAL_TABLET | ORAL | Status: DC

## 2013-11-16 MED ORDER — IOHEXOL 300 MG/ML  SOLN
100.0000 mL | Freq: Once | INTRAMUSCULAR | Status: AC | PRN
  Administered 2013-11-16: 100 mL via INTRAVENOUS

## 2013-11-16 MED ORDER — IOHEXOL 300 MG/ML  SOLN
50.0000 mL | Freq: Once | INTRAMUSCULAR | Status: AC | PRN
  Administered 2013-11-16: 50 mL via ORAL

## 2013-11-16 MED ORDER — AZITHROMYCIN 250 MG PO TABS
ORAL_TABLET | ORAL | Status: AC
  Administered 2013-11-16: 500 mg via ORAL
  Filled 2013-11-16: qty 2

## 2013-11-16 MED ORDER — METOCLOPRAMIDE HCL 5 MG/ML IJ SOLN
10.0000 mg | Freq: Once | INTRAMUSCULAR | Status: AC
  Administered 2013-11-16: 10 mg via INTRAVENOUS
  Filled 2013-11-16: qty 2

## 2013-11-16 MED ORDER — AZITHROMYCIN 250 MG PO TABS
500.0000 mg | ORAL_TABLET | Freq: Once | ORAL | Status: AC
  Administered 2013-11-16: 500 mg via ORAL

## 2013-11-16 NOTE — Discharge Instructions (Signed)
Take antibiotic to completion. It is very important for you to followup with your primary care physician for recheck, and with the gynecologist at [facility] hospital outpatient clinic as soon as possible. Pneumonia, Adult Pneumonia is an infection of the lungs. It may be caused by a germ (virus or bacteria). Some types of pneumonia can spread easily from person to person. This can happen when you cough or sneeze. HOME CARE  Only take medicine as told by your doctor.  Take your medicine (antibiotics) as told. Finish it even if you start to feel better.  Do not smoke.  You may use a vaporizer or humidifier in your room. This can help loosen thick spit (mucus).  Sleep so you are almost sitting up (semi-upright). This helps reduce coughing.  Rest. A shot (vaccine) can help prevent pneumonia. Shots are often advised for:  People over 69 years old.  Patients on chemotherapy.  People with long-term (chronic) lung problems.  People with immune system problems. GET HELP RIGHT AWAY IF:   You are getting worse.  You cannot control your cough, and you are losing sleep.  You cough up blood.  Your pain gets worse, even with medicine.  You have a fever.  Any of your problems are getting worse, not better.  You have shortness of breath or chest pain. MAKE SURE YOU:   Understand these instructions.  Will watch your condition.  Will get help right away if you are not doing well or get worse. Document Released: 01/23/2008 Document Revised: 10/29/2011 Document Reviewed: 10/27/2010 [facility] Patient Information 2014 [place].  Abdominal Pain, Women Abdominal (stomach, pelvic, or belly) pain can be caused by many things. It is important to tell your doctor:  The location of the pain.  Does it come and go or is it present all the time?  Are there things that start the pain (eating certain foods, exercise)?  Are there other symptoms associated with the pain (fever, nausea,  vomiting, diarrhea)? All of this is helpful to know when trying to find the cause of the pain. CAUSES   Stomach: virus or bacteria infection, or ulcer.  Intestine: appendicitis (inflamed appendix), regional ileitis (Crohn's disease), ulcerative colitis (inflamed colon), irritable bowel syndrome, diverticulitis (inflamed diverticulum of the colon), or cancer of the stomach or intestine.  Gallbladder disease or stones in the gallbladder.  Kidney disease, kidney stones, or infection.  Pancreas infection or cancer.  Fibromyalgia (pain disorder).  Diseases of the female organs:  Uterus: fibroid (non-cancerous) tumors or infection.  Fallopian tubes: infection or tubal pregnancy.  Ovary: cysts or tumors.  Pelvic adhesions (scar tissue).  Endometriosis (uterus lining tissue growing in the pelvis and on the pelvic organs).  Pelvic congestion syndrome (female organs filling up with blood just before the menstrual period).  Pain with the menstrual period.  Pain with ovulation (producing an egg).  Pain with an IUD (intrauterine device, birth control) in the uterus.  Cancer of the female organs.  Functional pain (pain not caused by a disease, may improve without treatment).  Psychological pain.  Depression. DIAGNOSIS  Your doctor will decide the seriousness of your pain by doing an examination.  Blood tests.  X-rays.  Ultrasound.  CT scan (computed tomography, special type of X-ray).  MRI (magnetic resonance imaging).  Cultures, for infection.  Barium enema (dye inserted in the large intestine, to better view it with X-rays).  Colonoscopy (looking in intestine with a lighted tube).  Laparoscopy (minor surgery, looking in abdomen with a lighted  tube).  Major abdominal exploratory surgery (looking in abdomen with a large incision). TREATMENT  The treatment will depend on the cause of the pain.   Many cases can be observed and treated at home.  Over-the-counter  medicines recommended by your caregiver.  Prescription medicine.  Antibiotics, for infection.  Birth control pills, for painful periods or for ovulation pain.  Hormone treatment, for endometriosis.  Nerve blocking injections.  Physical therapy.  Antidepressants.  Counseling with a psychologist or psychiatrist.  Minor or major surgery. HOME CARE INSTRUCTIONS   Do not take laxatives, unless directed by your caregiver.  Take over-the-counter pain medicine only if ordered by your caregiver. Do not take aspirin because it can cause an upset stomach or bleeding.  Try a clear liquid diet (broth or water) as ordered by your caregiver. Slowly move to a bland diet, as tolerated, if the pain is related to the stomach or intestine.  Have a thermometer and take your temperature several times a day, and record it.  Bed rest and sleep, if it helps the pain.  Avoid sexual intercourse, if it causes pain.  Avoid stressful situations.  Keep your follow-up appointments and tests, as your caregiver orders.  If the pain does not go away with medicine or surgery, you may try:  Acupuncture.  Relaxation exercises (yoga, meditation).  Group therapy.  Counseling. SEEK MEDICAL CARE IF:   You notice certain foods cause stomach pain.  Your home care treatment is not helping your pain.  You need stronger pain medicine.  You want your IUD removed.  You feel faint or lightheaded.  You develop nausea and vomiting.  You develop a rash.  You are having side effects or an allergy to your medicine. SEEK IMMEDIATE MEDICAL CARE IF:   Your pain does not go away or gets worse.  You have a fever.  Your pain is felt only in portions of the abdomen. The right side could possibly be appendicitis. The left lower portion of the abdomen could be colitis or diverticulitis.  You are passing blood in your stools (bright red or black tarry stools, with or without vomiting).  You have blood in  your urine.  You develop chills, with or without a fever.  You pass out. MAKE SURE YOU:   Understand these instructions.  Will watch your condition.  Will get help right away if you are not doing well or get worse. Document Released: 06/03/2007 Document Revised: 10/29/2011 Document Reviewed: 06/23/2009 [facility] Patient Information 2014 [place], [place].

## 2013-11-16 NOTE — ED Notes (Signed)
Vomiting x 3 days. No diarrhea. Weakness. Pain in her right abdomen and lower back.

## 2013-11-16 NOTE — ED Provider Notes (Signed)
CSN: [id number]     Arrival date & time 11/16/13  1710 History   First MD Initiated Contact with Patient 11/16/13 1853     Chief Complaint  Patient presents with  . Emesis     (Consider location/radiation/quality/duration/timing/severity/associated sxs/prior Treatment) HPI Comments: Patient is a 43 year old female with a past medical history of asthma, ulcerative colitis, anxiety, depression, chronic back and knee pain, thyroid disease, GERD and COPD who presents to the emergency department complaining of abdominal pain x3 days. Patient was seen in the emergency department on March 9 and had a CT scan that was concerning for GYN cancer, retroperitoneal lymphadenopathy and hydrosalpinx. She was advised to followup with the GYN clinic ASAP, however she never did so. States her pain feels similar, located on her right side, radiating along the lower part of her abdomen and towards the right side of her back, constant and sharp. Admits to associated nausea with multiple episodes of nonbloody emesis, 2 episodes of nonbloody diarrhea. She has not been able to keep anything down for the past 3 days. She has a history of partial hysterectomy, cholecystectomy and C-sections. Denies fever or chills. Also states she's had increased urinary frequency without dysuria.  Patient is a 43 y.o. female presenting with vomiting. The history is provided by the patient.  Emesis Associated symptoms: abdominal pain and diarrhea     Past Medical History  Diagnosis Date  . Asthma   . Ulcerative colitis   . Anxiety   . Depression   . Chronic back pain   . Chronic knee pain   . Thyroid disease     hypo  . GERD (gastroesophageal reflux disease)   . Insomnia   . Allergic rhinitis   . Arthritis   . COPD (chronic obstructive pulmonary disease)    Past Surgical History  Procedure Laterality Date  . Knee surgery      x3, two on left and one on right.  Arthoscopy  . Cholecystectomy    . Abdominal hysterectomy     . Cesarean section    . Tonsillectomy    . Joint replacement      2x on left, 1x on right   Family History  Problem Relation Age of Onset  . High blood pressure Mother   . Bipolar disorder Mother   . Asthma Father   . Parkinson's disease Father    History  Substance Use Topics  . Smoking status: Former Smoker -- 1.00 packs/day for 3 years    Types: Cigarettes    Quit date: 10/19/2011  . Smokeless tobacco: Never Used  . Alcohol Use: Yes     Comment: rarely    OB History   Grav Para Term Preterm Abortions TAB SAB Ect Mult Living                 Review of Systems  Constitutional: Positive for appetite change.  Gastrointestinal: Positive for nausea, vomiting, abdominal pain and diarrhea.  Genitourinary: Positive for frequency.  Musculoskeletal: Positive for back pain.  All other systems reviewed and are negative.      Allergies  Penicillins  Home Medications   Current Outpatient Rx  Name  Route  Sig  Dispense  Refill  . albuterol (PROVENTIL HFA;VENTOLIN HFA) 108 (90 BASE) MCG/ACT inhaler   Inhalation   Inhale 2 puffs into the lungs every 6 (six) hours as needed for wheezing.         Marland Kitchen albuterol-ipratropium (COMBIVENT) 18-103 MCG/ACT inhaler   Inhalation  Inhale 2 puffs into the lungs every 6 (six) hours as needed for wheezing.   1 Inhaler   1   . ALPRAZolam (XANAX) 1 MG tablet   Oral   Take 0.5 mg by mouth 3 (three) times daily as needed for anxiety.          Marland Kitchen azithromycin (ZITHROMAX Z-PAK) 250 MG tablet      Take 1 tab PO x 4 days   4 tablet   0   . buPROPion (WELLBUTRIN XL) 300 MG 24 hr tablet   Oral   Take 300 mg by mouth every morning.          . cholecalciferol (VITAMIN D) 1000 UNITS tablet   Oral   Take 1,000 Units by mouth every morning.         . citalopram (CELEXA) 40 MG tablet   Oral   Take 40 mg by mouth every morning.          . famotidine (PEPCID) 20 MG tablet   Oral   Take 20 mg by mouth 2 (two) times daily as  needed for heartburn.         . Fluticasone-Salmeterol (ADVAIR) 500-50 MCG/DOSE AEPB   Inhalation   Inhale 1 puff into the lungs every 12 (twelve) hours.         . furosemide (LASIX) 20 MG tablet   Oral   Take 20 mg by mouth every morning.         Marland Kitchen ipratropium-albuterol (DUONEB) 0.5-2.5 (3) MG/3ML SOLN   Nebulization   Take 3 mLs by nebulization every 6 (six) hours as needed (for wheezing).         Marland Kitchen levothyroxine (SYNTHROID, LEVOTHROID) 150 MCG tablet   Oral   Take 150 mcg by mouth daily before breakfast.         . montelukast (SINGULAIR) 10 MG tablet   Oral   Take 10 mg by mouth at bedtime.          Marland Kitchen morphine (MS CONTIN) 15 MG 12 hr tablet   Oral   Take 15 mg by mouth every 4 (four) hours as needed for pain.         Marland Kitchen morphine (MS CONTIN) 30 MG 12 hr tablet   Oral   Take 1 tablet (30 mg total) by mouth 2 (two) times daily.   60 tablet   0   . naproxen (NAPROSYN) 500 MG tablet   Oral   Take 1 tablet (500 mg total) by mouth 2 (two) times daily.   12 tablet   0   . omeprazole (PRILOSEC) 40 MG capsule   Oral   Take 40 mg by mouth every morning.          . ondansetron (ZOFRAN) 4 MG tablet   Oral   Take 1 tablet (4 mg total) by mouth every 6 (six) hours.   12 tablet   0   . oxyCODONE-acetaminophen (PERCOCET/ROXICET) 5-325 MG per tablet   Oral   Take 1-2 tablets by mouth every 6 (six) hours as needed for severe pain.   30 tablet   0   . promethazine (PHENERGAN) 25 MG tablet   Oral   Take 1 tablet (25 mg total) by mouth every 6 (six) hours as needed for nausea.   20 tablet   0   . traZODone (DESYREL) 100 MG tablet   Oral   Take 100 mg by mouth at bedtime as needed for sleep.  BP 110/60  Pulse 79  Temp(Src) 98.4 F (36.9 C) (Oral)  Resp 21  Ht 5\' 2"  (1.575 m)  Wt 210 lb (95.255 kg)  BMI 38.40 kg/m2  SpO2 100% Physical Exam  Nursing note and vitals reviewed. Constitutional: She is oriented to person, place, and time. She  appears well-developed and well-nourished. No distress.  Obese.  HENT:  Head: Normocephalic and atraumatic.  Mouth/Throat: Oropharynx is clear and moist.  Eyes: Conjunctivae are normal.  Neck: Normal range of motion. Neck supple.  Cardiovascular: Normal rate, regular rhythm and normal heart sounds.   Pulmonary/Chest: Effort normal and breath sounds normal.  Abdominal: Soft. Normal appearance and bowel sounds are normal. She exhibits no distension. There is tenderness in the right upper quadrant, right lower quadrant and suprapubic area. There is guarding. There is no rigidity, no rebound and no CVA tenderness.  Musculoskeletal: Normal range of motion. She exhibits no edema.  Neurological: She is alert and oriented to person, place, and time.  Skin: Skin is warm and dry. She is not diaphoretic.  Psychiatric: She has a normal mood and affect. Her behavior is normal.    ED Course  Procedures (including critical care time) Labs Review Labs Reviewed  CBC - Abnormal; Notable for the following:    RBC 3.83 (*)    Hemoglobin 9.4 (*)    HCT 31.3 (*)    MCH 24.5 (*)    RDW 16.9 (*)    All other components within normal limits  COMPREHENSIVE METABOLIC PANEL - Abnormal; Notable for the following:    Glucose, Bld 105 (*)    Albumin 3.3 (*)    GFR calc non Af Amer 77 (*)    GFR calc Af Amer 89 (*)    All other components within normal limits  URINALYSIS, ROUTINE W REFLEX MICROSCOPIC  LIPASE, BLOOD   Imaging Review Ct Abdomen Pelvis W Contrast  11/16/2013   CLINICAL DATA:  Abdominal pain, nausea, vomiting, weakness  EXAM: CT ABDOMEN AND PELVIS WITH CONTRAST  TECHNIQUE: Multidetector CT imaging of the abdomen and pelvis was performed using the standard protocol following bolus administration of intravenous contrast.  CONTRAST:  47mL OMNIPAQUE IOHEXOL 300 MG/ML SOLN, 166mL OMNIPAQUE IOHEXOL 300 MG/ML SOLN  COMPARISON:  Prior CT abdomen/ pelvis 10/26/2013  FINDINGS: Lower Chest: Persistent  diffuse peribronchovascular distribution of tree-in-bud micro nodularity throughout the visualized right middle and right lower lobe. Interval development of a 1.4 cm nodular opacity in the posteromedial aspect of the right lower lobe abutting the pleura compared to the recent prior study. Additionally, there are some focal opacities in the anterior right middle lobe an within the major fissure which are new compared to prior. Stable 9 mm nodule in the periphery of the right lower lobe (image 18 series 4). The left lung is relatively clear. Visualized cardiac structures are within normal limits for size. Small hiatal hernia with mild eccentric wall thickening is similar compared to prior studies dating back to August of 2014 and likely represents prominence of the rugal folds.  Abdomen: Unremarkable CT appearance of the intra-abdominal stomach, duodenum, spleen, adrenal glands and pancreas. Normal hepatic morphology and contours. No discrete hepatic lesion. The gallbladder is surgically absent. No intra or extrahepatic biliary ductal dilatation.  Unremarkable appearance of the bilateral kidneys. No focal solid lesion, hydronephrosis or nephrolithiasis.  No evidence of obstruction or focal bowel wall thickening. Normal appendix in the right lower quadrant. The terminal ileum is unremarkable. The appearance of a masslike density in the  cecum was not evident on the recent prior CT scan from 10/26/2013 where oral contrast material better opacified cecum. This almost certainly represents a stool ball on the present study given the short interval. Stable right para-aortic lymph node measures 1.5 cm in short axis on image 35 of series 2.  Pelvis: Unchanged tubular fluid-filled structure in the region of the right adnexa consistent with hydrosalpinx. This is similar compared to prior. The uterus is surgically absent. Nonspecific lower retroperitoneal adenopathy in the bilateral common iliac stations. Similar to the recent  prior imaging, a right external iliac node measures approximately 1.3 cm in short axis, a node in the midline just be low the aortic bifurcation measures 1.3 cm in short axis and a left external iliac station node measures 1.4 cm in short axis. The external iliac station nodes are essentially unremarkable. Stable soft tissue attenuation peritoneal nodules in the recto vesicular recess of Douglas. A 7 mm implants noted on image 75 of series 2, and 7 mm implant on image 74 of series 2.  Bones/Soft Tissues: No acute fracture or aggressive appearing lytic or blastic osseous lesion.  Vascular: No significant atherosclerotic vascular disease, aneurysmal dilatation or acute abnormality.  IMPRESSION: 1. Progressive nodular opacities in the right middle and right lower lobe superimposed on a background of diffuse tree-in-bud micro nodularity concerning for progression of the underlying infectious/ inflammatory process. 2. Persistent retroperitoneal adenopathy remains nonspecific but concerning. Primary differential considerations include a lymphoproliferative process such as lymphoma and metastatic disease. Reactive or granulomatous nodes are considered less likely. Consider further evaluation with PET-CT to evaluate for increased metabolic activity. 3. 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 4. Stable peritoneal implants versus endometrial implants in the cul-de-sac of Douglas. 5. Additional ancillary findings as above without significant interval change.   Electronically Signed   By: Jacqulynn Cadet M.D.   On: 11/16/2013 22:32     EKG Interpretation None      MDM   Final diagnoses:  Abdominal pain  Retroperitoneal lymphadenopathy  Community acquired pneumonia    Patient presenting with abdominal pain, nausea, vomiting. She appears in no apparent distress. Seen in the emergency department on March 9, had a concerning CT scan, discussed with GYN at that time and patient did not  followup. Plan to repeat labs and CT scan, pain and nausea medicine and given. Case discussed with attending Dr. Wyvonnia Dusky who agrees with plan of care. 10:53 PM CT with results as shown above. No changes in abdominal findings since last CT, however concern for infectious process in lungs. Lungs clear on exam. No CP or SOB. Pt states she has had a cough. Will treat for pneumonia with azithromycin. I did discuss this could also be a mass and will need to see her PCP. O2 sat 98% on RA. Stable for d/c, I discussed importance of followup with GYN regarding her CT scan, resources given, patient states she is aware and will call tomorrow. She will followup with her primary care physician regarding pneumonia. Return precautions given. Patient states understanding of treatment care plan and is agreeable.   Illene Labrador, PA-C 11/16/13 [place], PA-C 11/16/13 [phone]

## 2013-11-16 NOTE — ED Notes (Signed)
Pt requesting phenergan for nausea.  No emesis noted.  PA notified.

## 2013-11-16 NOTE — ED Notes (Signed)
Pt called out requesting more pain meds and afrin for nasal congestion.

## 2013-11-16 NOTE — ED Notes (Signed)
Patient transported to CT via stretcher per tech. 

## 2013-11-17 NOTE — ED Provider Notes (Signed)
Medical screening examination/treatment/procedure(s) were performed by non-physician practitioner and as supervising physician I was immediately available for consultation/collaboration.   EKG Interpretation None       Ezequiel Essex, MD 11/17/13 [phone]

## 2013-12-09 ENCOUNTER — Emergency Department (HOSPITAL_BASED_OUTPATIENT_CLINIC_OR_DEPARTMENT_OTHER): Payer: Self-pay

## 2013-12-09 ENCOUNTER — Encounter (HOSPITAL_BASED_OUTPATIENT_CLINIC_OR_DEPARTMENT_OTHER): Payer: Self-pay | Admitting: Emergency Medicine

## 2013-12-09 ENCOUNTER — Inpatient Hospital Stay (HOSPITAL_BASED_OUTPATIENT_CLINIC_OR_DEPARTMENT_OTHER)
Admission: EM | Admit: 2013-12-09 | Discharge: 2013-12-12 | DRG: 193 | Disposition: A | Discharge status: Home / Self Care | Payer: Self-pay | Attending: Internal Medicine | Admitting: Internal Medicine

## 2013-12-09 DIAGNOSIS — E079 Disorder of thyroid, unspecified: Secondary | ICD-10-CM

## 2013-12-09 DIAGNOSIS — J45901 Unspecified asthma with (acute) exacerbation: Secondary | ICD-10-CM

## 2013-12-09 DIAGNOSIS — E039 Hypothyroidism, unspecified: Secondary | ICD-10-CM | POA: Diagnosis present

## 2013-12-09 DIAGNOSIS — Z6832 Body mass index (BMI) 32.0-32.9, adult: Secondary | ICD-10-CM

## 2013-12-09 DIAGNOSIS — M1990 Unspecified osteoarthritis, unspecified site: Secondary | ICD-10-CM

## 2013-12-09 DIAGNOSIS — J9601 Acute respiratory failure with hypoxia: Secondary | ICD-10-CM | POA: Diagnosis present

## 2013-12-09 DIAGNOSIS — R0902 Hypoxemia: Secondary | ICD-10-CM

## 2013-12-09 DIAGNOSIS — G4700 Insomnia, unspecified: Secondary | ICD-10-CM

## 2013-12-09 DIAGNOSIS — F4323 Adjustment disorder with mixed anxiety and depressed mood: Secondary | ICD-10-CM | POA: Diagnosis present

## 2013-12-09 DIAGNOSIS — J309 Allergic rhinitis, unspecified: Secondary | ICD-10-CM

## 2013-12-09 DIAGNOSIS — G894 Chronic pain syndrome: Secondary | ICD-10-CM | POA: Diagnosis present

## 2013-12-09 DIAGNOSIS — D649 Anemia, unspecified: Secondary | ICD-10-CM | POA: Diagnosis present

## 2013-12-09 DIAGNOSIS — J154 Pneumonia due to other streptococci: Secondary | ICD-10-CM

## 2013-12-09 DIAGNOSIS — J441 Chronic obstructive pulmonary disease with (acute) exacerbation: Secondary | ICD-10-CM | POA: Diagnosis present

## 2013-12-09 DIAGNOSIS — M549 Dorsalgia, unspecified: Secondary | ICD-10-CM | POA: Diagnosis present

## 2013-12-09 DIAGNOSIS — Y95 Nosocomial condition: Secondary | ICD-10-CM

## 2013-12-09 DIAGNOSIS — D72829 Elevated white blood cell count, unspecified: Secondary | ICD-10-CM | POA: Diagnosis present

## 2013-12-09 DIAGNOSIS — J9692 Respiratory failure, unspecified with hypercapnia: Secondary | ICD-10-CM

## 2013-12-09 DIAGNOSIS — M129 Arthropathy, unspecified: Secondary | ICD-10-CM | POA: Diagnosis present

## 2013-12-09 DIAGNOSIS — F329 Major depressive disorder, single episode, unspecified: Secondary | ICD-10-CM | POA: Diagnosis present

## 2013-12-09 DIAGNOSIS — Z87891 Personal history of nicotine dependence: Secondary | ICD-10-CM

## 2013-12-09 DIAGNOSIS — F411 Generalized anxiety disorder: Secondary | ICD-10-CM | POA: Diagnosis present

## 2013-12-09 DIAGNOSIS — D509 Iron deficiency anemia, unspecified: Secondary | ICD-10-CM | POA: Diagnosis present

## 2013-12-09 DIAGNOSIS — K219 Gastro-esophageal reflux disease without esophagitis: Secondary | ICD-10-CM | POA: Diagnosis present

## 2013-12-09 DIAGNOSIS — J45902 Unspecified asthma with status asthmaticus: Secondary | ICD-10-CM

## 2013-12-09 DIAGNOSIS — F32A Depression, unspecified: Secondary | ICD-10-CM | POA: Diagnosis present

## 2013-12-09 DIAGNOSIS — J9600 Acute respiratory failure, unspecified whether with hypoxia or hypercapnia: Secondary | ICD-10-CM | POA: Diagnosis present

## 2013-12-09 DIAGNOSIS — F419 Anxiety disorder, unspecified: Secondary | ICD-10-CM | POA: Diagnosis present

## 2013-12-09 DIAGNOSIS — Z9981 Dependence on supplemental oxygen: Secondary | ICD-10-CM

## 2013-12-09 DIAGNOSIS — F3289 Other specified depressive episodes: Secondary | ICD-10-CM | POA: Diagnosis present

## 2013-12-09 DIAGNOSIS — J189 Pneumonia, unspecified organism: Principal | ICD-10-CM | POA: Diagnosis present

## 2013-12-09 DIAGNOSIS — J449 Chronic obstructive pulmonary disease, unspecified: Secondary | ICD-10-CM

## 2013-12-09 LAB — I-STAT ARTERIAL BLOOD GAS, ED
ACID-BASE DEFICIT: 1 mmol/L (ref 0.0–2.0)
BICARBONATE: 24.3 meq/L — AB (ref 20.0–24.0)
O2 Saturation: 84 %
PCO2 ART: 41.8 mmHg (ref 35.0–45.0)
PH ART: 7.375 (ref 7.350–7.450)
Patient temperature: 99.7
TCO2: 26 mmol/L (ref 0–100)
pO2, Arterial: 52 mmHg — ABNORMAL LOW (ref 80.0–100.0)

## 2013-12-09 LAB — COMPREHENSIVE METABOLIC PANEL
ALT: 9 U/L (ref 0–35)
AST: 14 U/L (ref 0–37)
Albumin: 4.1 g/dL (ref 3.5–5.2)
Alkaline Phosphatase: 76 U/L (ref 39–117)
BILIRUBIN TOTAL: 0.7 mg/dL (ref 0.3–1.2)
BUN: 8 mg/dL (ref 6–23)
CHLORIDE: 101 meq/L (ref 96–112)
CO2: 28 mEq/L (ref 19–32)
Calcium: 9.8 mg/dL (ref 8.4–10.5)
Creatinine, Ser: 1 mg/dL (ref 0.50–1.10)
GFR calc Af Amer: 78 mL/min — ABNORMAL LOW (ref >90)
GFR calc non Af Amer: 67 mL/min — ABNORMAL LOW (ref >90)
GLUCOSE: 102 mg/dL — AB (ref 70–99)
Potassium: 4 mEq/L (ref 3.7–5.3)
SODIUM: 142 meq/L (ref 137–147)
Total Protein: 8.1 g/dL (ref 6.0–8.3)

## 2013-12-09 LAB — I-STAT CG4 LACTIC ACID, ED: LACTIC ACID, VENOUS: 2.46 mmol/L — AB (ref 0.5–2.2)

## 2013-12-09 LAB — CBC WITH DIFFERENTIAL/PLATELET
BASOS ABS: 0 10*3/uL (ref 0.0–0.1)
Basophils Relative: 0 % (ref 0–1)
EOS PCT: 4 % (ref 0–5)
Eosinophils Absolute: 0.5 10*3/uL (ref 0.0–0.7)
HCT: 38.3 % (ref 36.0–46.0)
Hemoglobin: 11.6 g/dL — ABNORMAL LOW (ref 12.0–15.0)
LYMPHS ABS: 1.1 10*3/uL (ref 0.7–4.0)
LYMPHS PCT: 9 % — AB (ref 12–46)
MCH: 24.5 pg — ABNORMAL LOW (ref 26.0–34.0)
MCHC: 30.3 g/dL (ref 30.0–36.0)
MCV: 80.8 fL (ref 78.0–100.0)
Monocytes Absolute: 0.2 10*3/uL (ref 0.1–1.0)
Monocytes Relative: 2 % — ABNORMAL LOW (ref 3–12)
NEUTROS PCT: 85 % — AB (ref 43–77)
Neutro Abs: 10.2 10*3/uL — ABNORMAL HIGH (ref 1.7–7.7)
Platelets: 277 10*3/uL (ref 150–400)
RBC: 4.74 MIL/uL (ref 3.87–5.11)
RDW: 16.9 % — ABNORMAL HIGH (ref 11.5–15.5)
WBC: 12 10*3/uL — ABNORMAL HIGH (ref 4.0–10.5)

## 2013-12-09 LAB — TROPONIN I: Troponin I: <0.3 ng/mL (ref <0.30)

## 2013-12-09 LAB — CBC
HEMATOCRIT: 34.8 % — AB (ref 36.0–46.0)
Hemoglobin: 10.5 g/dL — ABNORMAL LOW (ref 12.0–15.0)
MCH: 24.1 pg — AB (ref 26.0–34.0)
MCHC: 30.2 g/dL (ref 30.0–36.0)
MCV: 80 fL (ref 78.0–100.0)
PLATELETS: 320 10*3/uL (ref 150–400)
RBC: 4.35 MIL/uL (ref 3.87–5.11)
RDW: 16.3 % — ABNORMAL HIGH (ref 11.5–15.5)
WBC: 20.2 10*3/uL — AB (ref 4.0–10.5)

## 2013-12-09 LAB — TSH: TSH: 0.671 u[IU]/mL (ref 0.350–4.500)

## 2013-12-09 LAB — CREATININE, SERUM
Creatinine, Ser: 0.97 mg/dL (ref 0.50–1.10)
GFR calc Af Amer: 81 mL/min — ABNORMAL LOW (ref >90)
GFR calc non Af Amer: 70 mL/min — ABNORMAL LOW (ref >90)

## 2013-12-09 LAB — PRO B NATRIURETIC PEPTIDE: Pro B Natriuretic peptide (BNP): 67.9 pg/mL (ref 0–125)

## 2013-12-09 LAB — MRSA PCR SCREENING: MRSA BY PCR: NEGATIVE

## 2013-12-09 LAB — LIPASE, BLOOD: Lipase: 31 U/L (ref 11–59)

## 2013-12-09 MED ORDER — MORPHINE SULFATE 4 MG/ML IJ SOLN
4.0000 mg | INTRAMUSCULAR | Status: DC | PRN
  Administered 2013-12-09 (×2): 4 mg via INTRAVENOUS
  Filled 2013-12-09 (×2): qty 1

## 2013-12-09 MED ORDER — PROMETHAZINE HCL 25 MG/ML IJ SOLN
25.0000 mg | Freq: Once | INTRAMUSCULAR | Status: AC
  Administered 2013-12-09: 25 mg via INTRAVENOUS
  Filled 2013-12-09: qty 1

## 2013-12-09 MED ORDER — ALPRAZOLAM 0.5 MG PO TABS
0.5000 mg | ORAL_TABLET | Freq: Three times a day (TID) | ORAL | Status: DC | PRN
  Administered 2013-12-09 – 2013-12-12 (×7): 0.5 mg via ORAL
  Filled 2013-12-09 (×7): qty 1

## 2013-12-09 MED ORDER — LEVOTHYROXINE SODIUM 150 MCG PO TABS
150.0000 ug | ORAL_TABLET | Freq: Every day | ORAL | Status: DC
  Administered 2013-12-10 – 2013-12-12 (×3): 150 ug via ORAL
  Filled 2013-12-09 (×4): qty 1

## 2013-12-09 MED ORDER — ACETAMINOPHEN 325 MG PO TABS: 650.0000 mg | ORAL_TABLET | Freq: Four times a day (QID) | ORAL | Status: DC | PRN

## 2013-12-09 MED ORDER — SODIUM CHLORIDE 0.9 % IJ SOLN
3.0000 mL | Freq: Two times a day (BID) | INTRAMUSCULAR | Status: DC
  Administered 2013-12-09 – 2013-12-12 (×6): 3 mL via INTRAVENOUS

## 2013-12-09 MED ORDER — MORPHINE SULFATE 4 MG/ML IJ SOLN
4.0000 mg | INTRAMUSCULAR | Status: DC | PRN
  Administered 2013-12-09: 4 mg via INTRAVENOUS
  Filled 2013-12-09: qty 1

## 2013-12-09 MED ORDER — ONDANSETRON HCL 4 MG PO TABS
4.0000 mg | ORAL_TABLET | Freq: Four times a day (QID) | ORAL | Status: DC | PRN
  Administered 2013-12-11: 4 mg via ORAL
  Filled 2013-12-09: qty 1

## 2013-12-09 MED ORDER — SODIUM CHLORIDE 0.9 % IV SOLN
INTRAVENOUS | Status: DC
Start: 2013-12-09 — End: 2013-12-10
  Administered 2013-12-09: 11:00:00 via INTRAVENOUS
  Administered 2013-12-10: 75 mL via INTRAVENOUS

## 2013-12-09 MED ORDER — CEFEPIME HCL 1 G IJ SOLR
1.0000 g | Freq: Three times a day (TID) | INTRAMUSCULAR | Status: DC
  Filled 2013-12-09 (×3): qty 1

## 2013-12-09 MED ORDER — MORPHINE SULFATE 2 MG/ML IJ SOLN
2.0000 mg | INTRAMUSCULAR | Status: DC | PRN
  Administered 2013-12-10 – 2013-12-11 (×6): 2 mg via INTRAVENOUS
  Filled 2013-12-09 (×7): qty 1

## 2013-12-09 MED ORDER — DEXTROSE 5 % IV SOLN
500.0000 mg | INTRAVENOUS | Status: DC
  Administered 2013-12-09 – 2013-12-10 (×2): 500 mg via INTRAVENOUS
  Filled 2013-12-09 (×2): qty 500

## 2013-12-09 MED ORDER — BUPROPION HCL ER (XL) 300 MG PO TB24
300.0000 mg | ORAL_TABLET | Freq: Every morning | ORAL | Status: DC
  Administered 2013-12-09 – 2013-12-12 (×4): 300 mg via ORAL
  Filled 2013-12-09 (×4): qty 1

## 2013-12-09 MED ORDER — ONDANSETRON HCL 4 MG/2ML IJ SOLN
4.0000 mg | Freq: Once | INTRAMUSCULAR | Status: AC
  Administered 2013-12-09: 4 mg via INTRAVENOUS
  Filled 2013-12-09: qty 2

## 2013-12-09 MED ORDER — ACETAMINOPHEN 650 MG RE SUPP: 650.0000 mg | Freq: Four times a day (QID) | RECTAL | Status: DC | PRN

## 2013-12-09 MED ORDER — IOHEXOL 300 MG/ML  SOLN
50.0000 mL | Freq: Once | INTRAMUSCULAR | Status: AC | PRN
  Administered 2013-12-09: 50 mL via ORAL

## 2013-12-09 MED ORDER — OXYCODONE-ACETAMINOPHEN 5-325 MG PO TABS
1.0000 | ORAL_TABLET | Freq: Four times a day (QID) | ORAL | Status: DC | PRN
  Administered 2013-12-09 – 2013-12-10 (×5): 2 via ORAL
  Administered 2013-12-11: 1 via ORAL
  Administered 2013-12-11: 2 via ORAL
  Filled 2013-12-09: qty 1
  Filled 2013-12-09 (×7): qty 2

## 2013-12-09 MED ORDER — METHYLPREDNISOLONE SODIUM SUCC 125 MG IJ SOLR
60.0000 mg | Freq: Four times a day (QID) | INTRAMUSCULAR | Status: DC
  Administered 2013-12-09 – 2013-12-10 (×7): 60 mg via INTRAVENOUS
  Administered 2013-12-11: 05:00:00 via INTRAVENOUS
  Administered 2013-12-11: 60 mg via INTRAVENOUS
  Filled 2013-12-09: qty 2
  Filled 2013-12-09: qty 0.96
  Filled 2013-12-09: qty 2
  Filled 2013-12-09: qty 0.96
  Filled 2013-12-09: qty 2
  Filled 2013-12-09 (×4): qty 0.96
  Filled 2013-12-09: qty 2
  Filled 2013-12-09 (×2): qty 0.96

## 2013-12-09 MED ORDER — IPRATROPIUM-ALBUTEROL 0.5-2.5 (3) MG/3ML IN SOLN
3.0000 mL | RESPIRATORY_TRACT | Status: DC
  Administered 2013-12-09 – 2013-12-12 (×17): 3 mL via RESPIRATORY_TRACT
  Filled 2013-12-09 (×19): qty 3

## 2013-12-09 MED ORDER — CEFEPIME HCL 1 G IJ SOLR
INTRAMUSCULAR | Status: AC
  Administered 2013-12-09: 1000 mg
  Filled 2013-12-09: qty 1

## 2013-12-09 MED ORDER — ENOXAPARIN SODIUM 40 MG/0.4ML ~~LOC~~ SOLN
40.0000 mg | SUBCUTANEOUS | Status: DC
  Administered 2013-12-09 – 2013-12-11 (×3): 40 mg via SUBCUTANEOUS
  Filled 2013-12-09 (×4): qty 0.4

## 2013-12-09 MED ORDER — MORPHINE SULFATE ER 15 MG PO TBCR
15.0000 mg | EXTENDED_RELEASE_TABLET | Freq: Two times a day (BID) | ORAL | Status: DC
  Administered 2013-12-09 – 2013-12-10 (×2): 15 mg via ORAL
  Filled 2013-12-09 (×2): qty 1

## 2013-12-09 MED ORDER — IOHEXOL 350 MG/ML SOLN
100.0000 mL | Freq: Once | INTRAVENOUS | Status: AC | PRN
  Administered 2013-12-09: 100 mL via INTRAVENOUS

## 2013-12-09 MED ORDER — ONDANSETRON HCL 4 MG/2ML IJ SOLN
4.0000 mg | Freq: Four times a day (QID) | INTRAMUSCULAR | Status: DC | PRN
  Administered 2013-12-09 – 2013-12-10 (×2): 4 mg via INTRAVENOUS
  Filled 2013-12-09 (×2): qty 2

## 2013-12-09 MED ORDER — CITALOPRAM HYDROBROMIDE 40 MG PO TABS
40.0000 mg | ORAL_TABLET | Freq: Every morning | ORAL | Status: DC
  Administered 2013-12-09 – 2013-12-12 (×4): 40 mg via ORAL
  Filled 2013-12-09 (×4): qty 1

## 2013-12-09 MED ORDER — METHYLPREDNISOLONE SODIUM SUCC 125 MG IJ SOLR
125.0000 mg | Freq: Once | INTRAMUSCULAR | Status: AC
Start: 2013-12-09 — End: 2013-12-09
  Administered 2013-12-09: 125 mg via INTRAVENOUS
  Filled 2013-12-09: qty 2

## 2013-12-09 MED ORDER — DOCUSATE SODIUM 100 MG PO CAPS
100.0000 mg | ORAL_CAPSULE | Freq: Two times a day (BID) | ORAL | Status: DC
  Administered 2013-12-09 – 2013-12-12 (×7): 100 mg via ORAL
  Filled 2013-12-09 (×9): qty 1

## 2013-12-09 MED ORDER — ALBUTEROL SULFATE (2.5 MG/3ML) 0.083% IN NEBU
2.5000 mg | INHALATION_SOLUTION | RESPIRATORY_TRACT | Status: DC | PRN
  Administered 2013-12-09: 2.5 mg via RESPIRATORY_TRACT
  Filled 2013-12-09: qty 3

## 2013-12-09 MED ORDER — DEXTROSE 5 % IV SOLN
1.0000 g | INTRAVENOUS | Status: DC
  Administered 2013-12-09: 1 g via INTRAVENOUS
  Filled 2013-12-09 (×2): qty 10

## 2013-12-09 MED ORDER — MOMETASONE FURO-FORMOTEROL FUM 200-5 MCG/ACT IN AERO
2.0000 | INHALATION_SPRAY | Freq: Two times a day (BID) | RESPIRATORY_TRACT | Status: DC
  Administered 2013-12-09 – 2013-12-12 (×7): 2 via RESPIRATORY_TRACT
  Filled 2013-12-09: qty 8.8

## 2013-12-09 MED ORDER — MONTELUKAST SODIUM 10 MG PO TABS
10.0000 mg | ORAL_TABLET | Freq: Every day | ORAL | Status: DC
  Administered 2013-12-09 – 2013-12-11 (×3): 10 mg via ORAL
  Filled 2013-12-09 (×4): qty 1

## 2013-12-09 MED ORDER — VANCOMYCIN HCL 10 G IV SOLR
2000.0000 mg | Freq: Once | INTRAVENOUS | Status: AC
  Administered 2013-12-09: 2000 mg via INTRAVENOUS
  Filled 2013-12-09: qty 2000

## 2013-12-09 MED ORDER — TRAZODONE HCL 100 MG PO TABS
100.0000 mg | ORAL_TABLET | Freq: Every evening | ORAL | Status: DC | PRN
  Filled 2013-12-09: qty 1

## 2013-12-09 NOTE — ED Provider Notes (Addendum)
CSN: [id number]     Arrival date & time 12/09/13  5643 History   None    Chief Complaint  Patient presents with  . Abdominal Pain      HPI  Patient presents via EMS to [facility] with the complaint of shortness of breath progressive over 3-4 days  Now, for the last 12 hours, with abdominal pain, and vomiting. Has a history of chronic pain, ulcerative colitis, COPD, multiple pneumonias, abnormal abdominal CT suggestive of possible malignancy, pending full evaluation.  Denies fever. Has had nausea and vomiting. Bowel movements without diarrhea. Progressive cough. Shortness of breath to the point is using her oxygen that she normally only wears at night during the daytime now as well.  Progressively dyspneic despite 3 L at home. Was 80% on 3 L at home tonight per EMS. Given nebulized albuterol high flow O2. Arrives here with an O2of 88% on nebulized albuterol via mask.  Pt is former smoker.  Quit 2013  Patient has not had formal evaluation despite multiple referrals for outpatient eval of abnormal abdominal CT (suggestive of possible malignancy including GYN malignancy or lymphoproliferative disorder). She is status post hysterectomy. Has not undergone oophorectomy.  3/30:  Discharged on azithromycin after imaging showed RUL nodular opacities RLL trees/bud abnormalities.. Was again referred to GYN clinic abnormalities after her CT showing retroperitoneal lymphadenopathy and peritoneal seeding.  Today (April 22)he states she has an appointment May 5 at the  [facility]  3/9:  Discharged from [facility] with followup appointment arranged at GYN clinic. Imaging showed nodular opacities in bilateral lower lobes inflammatory versus metastatic, retroperitoneal lymphadenopathy, hydrosalpinx, trace pelvic free fluid, and pelvic peritoneal implants.   2/1:  Seen and evaluated for COPD.  Improvement of left lower lobe pneumonia noted January 3.  1/3:  Treated outpatient with left  lower lobe pneumonia.  05/15/2013:  Admitted with left lower lobe pneumonia. Tx for HCAP  04/27/2013:  Admitted with right lower lobe pneumonia. Tx for CAP.    Past Medical History  Diagnosis Date  . Asthma   . Ulcerative colitis   . Anxiety   . Depression   . Chronic back pain   . Chronic knee pain   . Thyroid disease     hypo  . GERD (gastroesophageal reflux disease)   . Insomnia   . Allergic rhinitis   . Arthritis   . COPD (chronic obstructive pulmonary disease)    Past Surgical History  Procedure Laterality Date  . Knee surgery      x3, two on left and one on right.  Arthoscopy  . Cholecystectomy    . Abdominal hysterectomy    . Cesarean section    . Tonsillectomy    . Joint replacement      2x on left, 1x on right   Family History  Problem Relation Age of Onset  . High blood pressure Mother   . Bipolar disorder Mother   . Asthma Father   . Parkinson's disease Father    History  Substance Use Topics  . Smoking status: Former Smoker -- 1.00 packs/day for 3 years    Types: Cigarettes    Quit date: 10/19/2011  . Smokeless tobacco: Never Used  . Alcohol Use: Yes     Comment: rarely    OB History   Grav Para Term Preterm Abortions TAB SAB Ect Mult Living                 Review of Systems  Constitutional: Positive for fever, chills and fatigue. Negative for diaphoresis and appetite change.  HENT: Negative for mouth sores, sore throat and trouble swallowing.   Eyes: Negative for visual disturbance.  Respiratory: Positive for cough, shortness of breath and wheezing. Negative for chest tightness.   Cardiovascular: Positive for chest pain.  Gastrointestinal: Positive for nausea, vomiting and abdominal pain. Negative for diarrhea and abdominal distention.  Endocrine: Negative for polydipsia, polyphagia and polyuria.  Genitourinary: Negative for dysuria, frequency and hematuria.  Musculoskeletal: Negative for gait problem.  Skin: Negative for color change,  pallor and rash.  Neurological: Negative for dizziness, syncope, light-headedness and headaches.  Hematological: Does not bruise/bleed easily.  Psychiatric/Behavioral: Negative for behavioral problems and confusion.      Allergies  Penicillins  Home Medications   Prior to Admission medications   Medication Sig Start Date End Date Taking? Authorizing Provider  albuterol (PROVENTIL HFA;VENTOLIN HFA) 108 (90 BASE) MCG/ACT inhaler Inhale 2 puffs into the lungs every 6 (six) hours as needed for wheezing.    Historical Provider, MD  albuterol-ipratropium (COMBIVENT) 18-103 MCG/ACT inhaler Inhale 2 puffs into the lungs every 6 (six) hours as needed for wheezing. 06/16/13   Rolland Porter, MD  ALPRAZolam Prudy Feeler) 1 MG tablet Take 0.5 mg by mouth 3 (three) times daily as needed for anxiety.     Historical Provider, MD  azithromycin (ZITHROMAX Z-PAK) 250 MG tablet Take 1 tab PO x 4 days 11/16/13   Trevor Mace, PA-C  buPROPion (WELLBUTRIN XL) 300 MG 24 hr tablet Take 300 mg by mouth every morning.     Historical Provider, MD  cholecalciferol (VITAMIN D) 1000 UNITS tablet Take 1,000 Units by mouth every morning.    Historical Provider, MD  citalopram (CELEXA) 40 MG tablet Take 40 mg by mouth every morning.     Historical Provider, MD  famotidine (PEPCID) 20 MG tablet Take 20 mg by mouth 2 (two) times daily as needed for heartburn.    Historical Provider, MD  Fluticasone-Salmeterol (ADVAIR) 500-50 MCG/DOSE AEPB Inhale 1 puff into the lungs every 12 (twelve) hours.    Historical Provider, MD  furosemide (LASIX) 20 MG tablet Take 20 mg by mouth every morning.    Historical Provider, MD  ipratropium-albuterol (DUONEB) 0.5-2.5 (3) MG/3ML SOLN Take 3 mLs by nebulization every 6 (six) hours as needed (for wheezing).    Historical Provider, MD  levothyroxine (SYNTHROID, LEVOTHROID) 150 MCG tablet Take 150 mcg by mouth daily before breakfast.    Historical Provider, MD  montelukast (SINGULAIR) 10 MG tablet Take  10 mg by mouth at bedtime.     Historical Provider, MD  morphine (MS CONTIN) 15 MG 12 hr tablet Take 15 mg by mouth every 4 (four) hours as needed for pain.    Historical Provider, MD  morphine (MS CONTIN) 30 MG 12 hr tablet Take 1 tablet (30 mg total) by mouth 2 (two) times daily. 04/04/13   Renae Fickle, MD  naproxen (NAPROSYN) 500 MG tablet Take 1 tablet (500 mg total) by mouth 2 (two) times daily. 08/22/13   Enid Skeens, MD  omeprazole (PRILOSEC) 40 MG capsule Take 40 mg by mouth every morning.     Historical Provider, MD  ondansetron (ZOFRAN) 4 MG tablet Take 1 tablet (4 mg total) by mouth every 6 (six) hours. 06/16/13   Rolland Porter, MD  oxyCODONE-acetaminophen (PERCOCET/ROXICET) 5-325 MG per tablet Take 1-2 tablets by mouth every 6 (six) hours as needed for severe pain. 10/26/13   Cipriano Mile  Olean Ree, NP  promethazine (PHENERGAN) 25 MG tablet Take 1 tablet (25 mg total) by mouth every 6 (six) hours as needed for nausea. 10/26/13   Garald Balding, NP  traZODone (DESYREL) 100 MG tablet Take 100 mg by mouth at bedtime as needed for sleep.     Historical Provider, MD   BP 127/94  Pulse 116  Temp(Src) 99.7 F (37.6 C) (Oral)  Resp 24  SpO2 88% Physical Exam  Constitutional: She is oriented to person, place, and time. She appears well-developed and well-nourished. She appears distressed.  Obese dyspneic female. Mild respiratory distress undergoing nebulized albuterol. Tachypnea. Speaking 4-5 words.  HENT:  Head: Normocephalic.  Eyes: Conjunctivae are normal. Pupils are equal, round, and reactive to light. No scleral icterus.  Neck: Normal range of motion. Neck supple. No thyromegaly present.  Short thick neck  Cardiovascular: Regular rhythm.  Exam reveals no gallop and no friction rub.   No murmur heard. No crackles. No gallop. Sinus rhythm. Tachycardia.  Pulmonary/Chest: She is in respiratory distress. She has wheezes. She has no rales.  Globally diminished breath sounds. Prolongation and  wheezing.  Abdominal: Soft. Bowel sounds are normal. She exhibits no distension. There is no tenderness. There is no rebound.  Obese. Complains of diffuse pain. No peritoneal irritation. Normal active bowel sounds.  Musculoskeletal: Normal range of motion.  Neurological: She is alert and oriented to person, place, and time.  Skin: Skin is warm and dry. No rash noted.  Trace symmetric bilateral extremity edema    ED Course  Procedures (including critical care time) Labs Review Labs Reviewed  CBC WITH DIFFERENTIAL - Abnormal; Notable for the following:    WBC 12.0 (*)    Hemoglobin 11.6 (*)    MCH 24.5 (*)    RDW 16.9 (*)    Neutrophils Relative % 85 (*)    Neutro Abs 10.2 (*)    Lymphocytes Relative 9 (*)    Monocytes Relative 2 (*)    All other components within normal limits  COMPREHENSIVE METABOLIC PANEL - Abnormal; Notable for the following:    Glucose, Bld 102 (*)    GFR calc non Af Amer 67 (*)    GFR calc Af Amer 78 (*)    All other components within normal limits  I-STAT CG4 LACTIC ACID, ED - Abnormal; Notable for the following:    Lactic Acid, Venous 2.46 (*)    All other components within normal limits  I-STAT ARTERIAL BLOOD GAS, ED - Abnormal; Notable for the following:    pO2, Arterial 52.0 (*)    Bicarbonate 24.3 (*)    All other components within normal limits  CULTURE, BLOOD (SINGLE)  LIPASE, BLOOD  PRO B NATRIURETIC PEPTIDE  BLOOD GAS, ARTERIAL    Imaging Review Ct Angio Chest Pe W/cm &/or Wo Cm  12/09/2013   CLINICAL DATA:  Shortness of breath, wheezing, abdominal pain, nausea, and vomiting.  EXAM: CT ANGIOGRAPHY CHEST WITH CONTRAST  TECHNIQUE: Multidetector CT imaging of the chest was performed using the standard protocol during bolus administration of intravenous contrast. Multiplanar CT image reconstructions and MIPs were obtained to evaluate the vascular anatomy.  CONTRAST:  48mL OMNIPAQUE IOHEXOL 300 MG/ML SOLN, 19mL OMNIPAQUE IOHEXOL 350 MG/ML SOLN   COMPARISON:  04/01/2013  FINDINGS: The contrast bolus is somewhat limited but there is moderately good opacification of the central and segmental pulmonary arteries. No focal filling defects are identified. No evidence of significant pulmonary embolus. Mild prominence of lymph nodes in the right  hilum and mediastinum without pathologic enlargement. These are likely reactive. The heart size is mildly increased. Normal caliber thoracic aorta. There is a small esophageal hiatal hernia. Residual contrast material throughout the esophagus suggest reflux or dysmotility. Patchy airspace disease throughout the right lung and in the left lung base most suggestive of multifocal pneumonia. Appearance is worse is seen in the previous study there is a 7 mm nodule in the right lung base posteriorly that is stable since the previous study. Chest x-rays obtained between the 2 studies suggest that the infiltrative changes are recurrent rather than persistent. No pleural effusions. Visualized portions of the upper abdominal organs are unremarkable.  Review of the MIP images confirms the above findings.  IMPRESSION: No evidence of significant pulmonary embolus. Airspace disease throughout the right lung and in the left lower lung consistent with multifocal pneumonia.   Electronically Signed   By: Lucienne Capers M.D.   On: 12/09/2013 06:03   Ct Abdomen Pelvis W Contrast  12/09/2013   CLINICAL DATA:  Shortness of breath, wheezing, abdominal pain, nausea, vomiting. History of ulcerative colitis.  EXAM: CT ABDOMEN AND PELVIS WITH CONTRAST  TECHNIQUE: Multidetector CT imaging of the abdomen and pelvis was performed using the standard protocol following bolus administration of intravenous contrast.  CONTRAST:  45mL OMNIPAQUE IOHEXOL 300 MG/ML SOLN, 118mL OMNIPAQUE IOHEXOL 350 MG/ML SOLN  COMPARISON:  11/16/2013  FINDINGS: Consolidation in the right lung base as demonstrated on CT chest obtained at the same time. Small esophageal hiatal  hernia.  Surgical absence of the gallbladder. The liver, spleen, pancreas, adrenal glands, kidneys, abdominal aorta, inferior vena cava, visualize small bowel, and colon are unremarkable. There is mild prominence of retroperitoneal lymph nodes with a a aorta caval lymph node measuring up to about 15 mm diameter. This is stable since previous study. No free air or free fluid in the abdomen.  Pelvis: Fluid collections in the pelvis are similar to previous study, previously indicated as hydrosalpinx. Uterus appears to be surgically absent. Ovaries are not enlarged. Appendix is normal. No evidence of diverticulitis. Bladder wall is not thickened. No destructive bone lesions.  IMPRESSION: No significant changes since previous study in the abdomen or pelvis. Progression of consolidative changes in the right lung base probably representing recurrent pneumonia.   Electronically Signed   By: Lucienne Capers M.D.   On: 12/09/2013 06:12   Dg Chest Port 1 View  12/09/2013   CLINICAL DATA:  Shortness of breath, wheezing, abdominal pain, nausea, vomiting age.  EXAM: PORTABLE CHEST - 1 VIEW  COMPARISON:  DG CHEST 1V PORT dated 09/20/2013  FINDINGS: Shallow inspiration. Mild cardiac enlargement and pulmonary vascular congestion. Patchy nodular airspace disease in the upper lungs, predominantly on the right. Changes likely represent pulmonary edema although multifocal pneumonia could also have this appearance. No blunting of costophrenic angles.  IMPRESSION: Cardiac enlargement with mild pulmonary vascular congestion. Patchy airspace disease in the upper lungs, greater on the right.   Electronically Signed   By: Lucienne Capers M.D.   On: 12/09/2013 04:43     EKG Interpretation   Date/Time:  Wednesday December 09 2013 04:16:06 EDT Ventricular Rate:  121 PR Interval:  118 QRS Duration: 74 QT Interval:  330 QTC Calculation: 468 R Axis:   46 Text Interpretation:  Sinus tachycardia Otherwise normal ECG Confirmed by  Jeneen Rinks   MD, [place] ([id number]) on 12/09/2013 4:39:48 AM      MDM   Final diagnoses:  Healthcare-associated pneumonia    04:33:  After completing one half hour continuous neb  initiated by EMS her aeration is improved. She is requiring 4 L of nasal cannula to maintain 91%. ABG shows mild relative hypercapnia. PH 7.38. PCO2 40. PO2 50. Lactate 2.46. Remainder of  studies pending.  Differential diagnosis is broad. If this indeed is an active malignancy, then she is at risk for pulmonary embolus. Has signs suggestive of COPD exacerbation. Multiple recent pneumonias.  Rule out progression of abnormalities noted on CT that may the nidus for recurrent pneumonias. Metastatic disease.  Lymphoma. CT chest, abdomen, pelvis requested and pending.  05:00: Dificulty with hemolyzed lab specimen.  RT UE US guided 20g IV placed by myself for CT angio.  Labs obtained. Pt less dyspneic.  06:20:  Bp127/94.  HR 99. 90% on 3 l [place].  Oral temp 100.9.   Recurrent wheezing.  Neb repeated. CT results discussed with patient.  Blood cultures obtained.  Antibiotics ordered.  Given tylenol. Call placed to Hospitalist re: admission.    Tanna Furry, MD 12/09/13 [id number]  Tanna Furry, MD 12/09/13 0630

## 2013-12-09 NOTE — ED Notes (Signed)
Attempted to call report to [facility], per secretary need callback, RN unavailable for report.

## 2013-12-09 NOTE — H&P (Signed)
[facility] History and Physical  Debra [name] [id number] DOB: Sep 02, 1970 DOA: 12/09/2013  Referring physician:  PCP: Guadlupe Spanish, MD   Chief Complaint: Cough/shortness of breath  HPI: Debra [name] is a 43 y.o. female with a past medical history of chronic obstructive pulmonary disease, history of pneumonia, ulcerative colitis, uses supplemental oxygen at bedtime, presents as a transfer from [place]. She reports progressively worsening cough, wheezing, shortness of breath over the past week. Symptoms have worsened despite taking her home breathing treatments and using supplemental oxygen. Cough is associated with yellow to brown sputum production, and is nonbloody. She also complains of retrosternal chest pain characterized as tight, worsened by deep inspiration and cough. Patient also complains of nausea and vomiting, abdominal pain, generalized weakness, malaise and fatigue. She was found to have an O2 sat of 88% with respiratory distress at [facility]. She had an ER visit on 11/16/2013 at which time she was prescribed azithromycin. CT scan of the chest performed at [facility] showing consolidative changes in the right lung base which likely represent recurrent pneumonia.                                                                                                                 Review of Systems:  Constitutional:  No weight loss, positive for Night sweats, Fevers, chills, fatigue.  HEENT:  No headaches, Difficulty swallowing,Tooth/dental problems,Sore throat,  No sneezing, itching, ear ache, nasal congestion, post nasal drip,  Cardio-vascular:  Positive for chest pain, no Orthopnea, PND, swelling in lower extremities, anasarca, dizziness, palpitations  GI:  No heartburn, indigestion, positive for abdominal pain, nausea, vomiting, diarrhea, change in bowel habits, loss of appetite  Resp:  Positive for shortness of breath with  exertion or at rest, excess mucus, productive cough, No coughing up of blood.No change in color of mucus.No wheezing.No chest wall deformity  Skin:  no rash or lesions.  GU:  no dysuria, change in color of urine, no urgency or frequency. No flank pain.  Musculoskeletal:  No joint pain or swelling. No decreased range of motion. No back pain.  Psych:  No change in mood or affect. No depression or anxiety. No memory loss.   Past Medical History  Diagnosis Date  . Asthma   . Ulcerative colitis   . Anxiety   . Depression   . Chronic back pain   . Chronic knee pain   . Thyroid disease     hypo  . GERD (gastroesophageal reflux disease)   . Insomnia   . Allergic rhinitis   . Arthritis   . COPD (chronic obstructive pulmonary disease)    Past Surgical History  Procedure Laterality Date  . Knee surgery      x3, two on left and one on right.  Arthoscopy  . Cholecystectomy    . Abdominal hysterectomy    . Cesarean section    . Tonsillectomy    . Joint replacement      2x on  left, 1x on right   Social History:  reports that she quit smoking about 2 years ago. Her smoking use included Cigarettes. She has a 3 pack-year smoking history. She has never used smokeless tobacco. She reports that she drinks alcohol. She reports that she does not use illicit drugs.  Allergies  Allergen Reactions  . Penicillins Swelling and Rash    Family History  Problem Relation Age of Onset  . High blood pressure Mother   . Bipolar disorder Mother   . Asthma Father   . Parkinson's disease Father      Prior to Admission medications   Medication Sig Start Date End Date Taking? Authorizing Provider  albuterol (PROVENTIL HFA;VENTOLIN HFA) 108 (90 BASE) MCG/ACT inhaler Inhale 2 puffs into the lungs every 6 (six) hours as needed for wheezing.   Yes Historical Provider, MD  ALPRAZolam Duanne Moron) 1 MG tablet Take 0.5 mg by mouth 3 (three) times daily as needed for anxiety.    Yes Historical Provider, MD    buPROPion (WELLBUTRIN XL) 300 MG 24 hr tablet Take 300 mg by mouth every morning.    Yes Historical Provider, MD  cholecalciferol (VITAMIN D) 1000 UNITS tablet Take 1,000 Units by mouth every morning.   Yes Historical Provider, MD  citalopram (CELEXA) 40 MG tablet Take 40 mg by mouth every morning.    Yes Historical Provider, MD  Fluticasone-Salmeterol (ADVAIR) 500-50 MCG/DOSE AEPB Inhale 1 puff into the lungs every 12 (twelve) hours.   Yes Historical Provider, MD  furosemide (LASIX) 20 MG tablet Take 20 mg by mouth every morning.   Yes Historical Provider, MD  ipratropium-albuterol (DUONEB) 0.5-2.5 (3) MG/3ML SOLN Take 3 mLs by nebulization every 6 (six) hours as needed (for wheezing).   Yes Historical Provider, MD  levothyroxine (SYNTHROID, LEVOTHROID) 150 MCG tablet Take 150 mcg by mouth daily before breakfast.   Yes Historical Provider, MD  montelukast (SINGULAIR) 10 MG tablet Take 10 mg by mouth at bedtime.    Yes Historical Provider, MD  morphine (MS CONTIN) 15 MG 12 hr tablet Take 15 mg by mouth every 4 (four) hours as needed for pain.   Yes Historical Provider, MD  morphine (MS CONTIN) 30 MG 12 hr tablet Take 1 tablet (30 mg total) by mouth 2 (two) times daily. 04/04/13  Yes Janece Canterbury, MD  omeprazole (PRILOSEC) 40 MG capsule Take 40 mg by mouth every morning.    Yes Historical Provider, MD  promethazine (PHENERGAN) 25 MG tablet Take 1 tablet (25 mg total) by mouth every 6 (six) hours as needed for nausea. 10/26/13  Yes Garald Balding, NP  traZODone (DESYREL) 100 MG tablet Take 100 mg by mouth at bedtime as needed for sleep.    Yes Historical Provider, MD   Physical Exam: Filed Vitals:   12/09/13 1000  BP: 122/74  Pulse: 84  Temp:   Resp: 11    BP 122/74  Pulse 84  Temp(Src) 98.3 F (36.8 C) (Oral)  Resp 11  Ht 5\' 2"  (1.575 m)  Wt 93.3 kg (205 lb 11 oz)  BMI 37.61 kg/m2  SpO2 93%  General:  Patient appears to be in mild to moderate distress, she is awake and alert, and  responding appropriately to questions. Eyes: PERRL, normal lids, irises & conjunctiva ENT: grossly normal hearing, lips & tongue Neck: no LAD, masses or thyromegaly Cardiovascular: Tachycardia, RRR, no m/r/g. No LE edema. Telemetry: SR, no arrhythmias  Respiratory: Patient in mild respiratory distress, diminished breath sounds bilaterally  with presence of bilateral expiratory wheezes. Positive crackles, right-sided rhonchi. Abdomen: soft, mild tenderness to palpation over left lower quadrant Skin: no rash or induration seen on limited exam Musculoskeletal: grossly normal tone BUE/BLE Psychiatric: grossly normal mood and affect, speech fluent and appropriate Neurologic: grossly non-focal.          Labs on Admission:  Basic Metabolic Panel:  Recent Labs Lab 12/09/13 0520  NA 142  K 4.0  CL 101  CO2 28  GLUCOSE 102*  BUN 8  CREATININE 1.00  CALCIUM 9.8   Liver Function Tests:  Recent Labs Lab 12/09/13 0520  AST 14  ALT 9  ALKPHOS 76  BILITOT 0.7  PROT 8.1  ALBUMIN 4.1    Recent Labs Lab 12/09/13 0520  LIPASE 31   No results found for this basename: AMMONIA,  in the last 168 hours CBC:  Recent Labs Lab 12/09/13 0410  WBC 12.0*  NEUTROABS 10.2*  HGB 11.6*  HCT 38.3  MCV 80.8  PLT 277   Cardiac Enzymes: No results found for this basename: CKTOTAL, CKMB, CKMBINDEX, TROPONINI,  in the last 168 hours  BNP (last 3 results)  Recent Labs  12/09/13 0520  PROBNP 67.9   CBG: No results found for this basename: GLUCAP,  in the last 168 hours  Radiological Exams on Admission: Ct Angio Chest Pe W/cm &/or Wo Cm  12/09/2013   CLINICAL DATA:  Shortness of breath, wheezing, abdominal pain, nausea, and vomiting.  EXAM: CT ANGIOGRAPHY CHEST WITH CONTRAST  TECHNIQUE: Multidetector CT imaging of the chest was performed using the standard protocol during bolus administration of intravenous contrast. Multiplanar CT image reconstructions and MIPs were obtained to  evaluate the vascular anatomy.  CONTRAST:  2mL OMNIPAQUE IOHEXOL 300 MG/ML SOLN, OMNIPAQUE IOHEXOL 350 MG/ML SOLN  COMPARISON:  04/01/2013  FINDINGS: The contrast bolus is somewhat limited but there is moderately good opacification of the central and segmental pulmonary arteries. No focal filling defects are identified. No evidence of significant pulmonary embolus. Mild prominence of lymph nodes in the right hilum and mediastinum without pathologic enlargement. These are likely reactive. The heart size is mildly increased. Normal caliber thoracic aorta. There is a small esophageal hiatal hernia. Residual contrast material throughout the esophagus suggest reflux or dysmotility. Patchy airspace disease throughout the right lung and in the left lung base most suggestive of multifocal pneumonia. Appearance is worse is seen in the previous study there is a 7 mm nodule in the right lung base posteriorly that is stable since the previous study. Chest x-rays obtained between the 2 studies suggest that the infiltrative changes are recurrent rather than persistent. No pleural effusions. Visualized portions of the upper abdominal organs are unremarkable.  Review of the MIP images confirms the above findings.  IMPRESSION: No evidence of significant pulmonary embolus. Airspace disease throughout the right lung and in the left lower lung consistent with multifocal pneumonia.   Electronically Signed   By: Burman Nieves M.D.   On: 12/09/2013 06:03   Ct Abdomen Pelvis W Contrast  12/09/2013   CLINICAL DATA:  Shortness of breath, wheezing, abdominal pain, nausea, vomiting. History of ulcerative colitis.  EXAM: CT ABDOMEN AND PELVIS WITH CONTRAST  TECHNIQUE: Multidetector CT imaging of the abdomen and pelvis was performed using the standard protocol following bolus administration of intravenous contrast.  CONTRAST:  46mL OMNIPAQUE IOHEXOL 300 MG/ML SOLN, OMNIPAQUE IOHEXOL 350 MG/ML SOLN  COMPARISON:  11/16/2013   FINDINGS: Consolidation in the right lung base as  demonstrated on CT chest obtained at the same time. Small esophageal hiatal hernia.  Surgical absence of the gallbladder. The liver, spleen, pancreas, adrenal glands, kidneys, abdominal aorta, inferior vena cava, visualize small bowel, and colon are unremarkable. There is mild prominence of retroperitoneal lymph nodes with a a aorta caval lymph node measuring up to about 15 mm diameter. This is stable since previous study. No free air or free fluid in the abdomen.  Pelvis: Fluid collections in the pelvis are similar to previous study, previously indicated as hydrosalpinx. Uterus appears to be surgically absent. Ovaries are not enlarged. Appendix is normal. No evidence of diverticulitis. Bladder wall is not thickened. No destructive bone lesions.  IMPRESSION: No significant changes since previous study in the abdomen or pelvis. Progression of consolidative changes in the right lung base probably representing recurrent pneumonia.   Electronically Signed   By: Lucienne Capers M.D.   On: 12/09/2013 06:12   Dg Chest Port 1 View  12/09/2013   CLINICAL DATA:  Shortness of breath, wheezing, abdominal pain, nausea, vomiting age.  EXAM: PORTABLE CHEST - 1 VIEW  COMPARISON:  DG CHEST 1V PORT dated 09/20/2013  FINDINGS: Shallow inspiration. Mild cardiac enlargement and pulmonary vascular congestion. Patchy nodular airspace disease in the upper lungs, predominantly on the right. Changes likely represent pulmonary edema although multifocal pneumonia could also have this appearance. No blunting of costophrenic angles.  IMPRESSION: Cardiac enlargement with mild pulmonary vascular congestion. Patchy airspace disease in the upper lungs, greater on the right.   Electronically Signed   By: Lucienne Capers M.D.   On: 12/09/2013 04:43    EKG: Independently reviewed.   Assessment/Plan Principal Problem:   CAP (community acquired pneumonia) Active Problems:   Acute respiratory  failure with hypoxia   COPD exacerbation   Leukocytosis   Hypothyroidism   Anxiety state, unspecified   1. Community acquire pneumonia. Patient with history of recurrent pneumonia, presenting to [facility] today with complaints of worsening cough and shortness of breath. A CT scan of lungs showing the presence of multifocal pneumonia. Will start patient on empiric IV antimicrobial therapy with ceftriaxone and azithromycin with plan to repeat chest x-ray in a.m. consider broadening coverage if she becomes unstable or spikes temperatures. She is afebrile during my evaluation. Followup on blood cultures and provide supportive care. 2. Acute hypoxemic respiratory failure, evidence by an O2 sat of 88%, patient presenting in respiratory distress. Likely secondary to acquire pneumonia and superimposed COPD exacerbation. Treat with IV steroids, IV antibiotics, nebulizers, supplemental oxygen. Of note CT scan with contrast not show evidence for pulmonary embolism. 3. Chronic obstructive palmar disease exacerbation. Patient having diminished breath sounds with bilateral expiratory wheezes on exam. Starting Solu-Medrol 60 mg IV every 6 hours, DuoNeb's, inhaled steroid, empiric IV antibiotic therapy. 4. Retroperitoneal adenopathy. Patient having a CT scan of abdomen and pelvis was performed on 11/16/2013 showing persistent retroperitoneal adenopathy. There were concerns for possible underlying GYN malignancy as radiology report recommended a followup evaluation with PET scan. She was given followup appointment with GYN from [place]. Patient states she has an appointment at [facility] on 12/22/2013. Repeat CT scan performed today showing mild prominence of retroperitoneal lymph nodes, which is stable when compared to previous study per radiology report. Of note there were no destructive bone lesions or mass noted. Ovaries were not felt to be enlarged as uterus is surgically  absent. Patient will need followup at GYN clinic.  5. Hypothyroidism. Will check  a TSH, meanwhile continue Synthroid 150 mcg by mouth daily 6. Chronic pain syndrome, narcotic dependent. Continue MS Contin and Percocet with IV morphine for severe breakthrough pain 7. DVT prophylaxis. Lovenox   Code Status: Full Code Family Communication:  Disposition Plan: Will admit to Step Down Unit, anticipate her requiring greater than 2 nights hospitalizaiton  Time spent: 70 min  [facility] Pager [phone]

## 2013-12-09 NOTE — ED Notes (Signed)
Per EMS pt c/o chronic lower abd pain x4days ago with nausea took her own pain meds morphine with no relief, was given zofran 4mg  enroute with no relief; pt hx COPD started wheezing enroute and was given a duo neb. 22g to rt wrist. Pt in no distress

## 2013-12-09 NOTE — Care Management Note (Addendum)
    Page 1 of 1   12/10/2013     3:58:25 PM CARE MANAGEMENT NOTE 12/10/2013  Patient:  Dansereau,Elverna L   Account Number:  [id number]  Date Initiated:  12/09/2013  Documentation initiated by:  Elissa Hefty  Subjective/Objective Assessment:   adm w pneumonia     Action/Plan:   lives w mother pcp dr Charna Archer, uses o2 at nite at home per chart   Anticipated DC Date:     Anticipated DC Plan:    In-house referral  Financial Counselor      DC Planning Services  CM consult      Choice offered to / List presented to:             Status of service:   Medicare Important Message given?   (If response is "NO", the following Medicare IM given date fields will be blank) Date Medicare IM given:   Date Additional Medicare IM given:    Discharge Disposition:  HOME/SELF CARE  Per UR Regulation:  Reviewed for med. necessity/level of care/duration of stay  If discussed at [place] of Stay Meetings, dates discussed:    Comments:  4/23 1554 debbie Keeshia Sanderlin rn,bsn spoke w pt. no ins but does have pcp dr Holley Raring. her mother assists w med cost. pt's md prescribes as many genric meds as possible and pt states has as low as possible. gave her [employer] and prescription discount card. will alert [employer] pt has applied for soc sec disability and is in appeal process.

## 2013-12-10 ENCOUNTER — Inpatient Hospital Stay (HOSPITAL_COMMUNITY): Payer: Self-pay

## 2013-12-10 DIAGNOSIS — F3289 Other specified depressive episodes: Secondary | ICD-10-CM

## 2013-12-10 DIAGNOSIS — J449 Chronic obstructive pulmonary disease, unspecified: Secondary | ICD-10-CM

## 2013-12-10 DIAGNOSIS — D649 Anemia, unspecified: Secondary | ICD-10-CM | POA: Diagnosis present

## 2013-12-10 DIAGNOSIS — J309 Allergic rhinitis, unspecified: Secondary | ICD-10-CM

## 2013-12-10 DIAGNOSIS — G894 Chronic pain syndrome: Secondary | ICD-10-CM

## 2013-12-10 DIAGNOSIS — F329 Major depressive disorder, single episode, unspecified: Secondary | ICD-10-CM

## 2013-12-10 DIAGNOSIS — R0902 Hypoxemia: Secondary | ICD-10-CM

## 2013-12-10 LAB — BASIC METABOLIC PANEL
BUN: 11 mg/dL (ref 6–23)
CO2: 23 mEq/L (ref 19–32)
Calcium: 9.3 mg/dL (ref 8.4–10.5)
Chloride: 105 mEq/L (ref 96–112)
Creatinine, Ser: 0.79 mg/dL (ref 0.50–1.10)
GFR calc non Af Amer: >90 mL/min (ref >90)
Glucose, Bld: 151 mg/dL — ABNORMAL HIGH (ref 70–99)
POTASSIUM: 4.5 meq/L (ref 3.7–5.3)
Sodium: 140 mEq/L (ref 137–147)

## 2013-12-10 LAB — PRO B NATRIURETIC PEPTIDE: PRO B NATRI PEPTIDE: 597.2 pg/mL — AB (ref 0–125)

## 2013-12-10 LAB — CBC
HCT: 31.4 % — ABNORMAL LOW (ref 36.0–46.0)
HEMOGLOBIN: 9.3 g/dL — AB (ref 12.0–15.0)
MCH: 23.7 pg — ABNORMAL LOW (ref 26.0–34.0)
MCHC: 29.6 g/dL — AB (ref 30.0–36.0)
MCV: 80.1 fL (ref 78.0–100.0)
Platelets: 283 10*3/uL (ref 150–400)
RBC: 3.92 MIL/uL (ref 3.87–5.11)
RDW: 16.5 % — ABNORMAL HIGH (ref 11.5–15.5)
WBC: 14.3 10*3/uL — AB (ref 4.0–10.5)

## 2013-12-10 LAB — RETICULOCYTES
RBC.: 3.75 MIL/uL — ABNORMAL LOW (ref 3.87–5.11)
Retic Count, Absolute: 45 10*3/uL (ref 19.0–186.0)
Retic Ct Pct: 1.2 % (ref 0.4–3.1)

## 2013-12-10 MED ORDER — MORPHINE SULFATE ER 15 MG PO TBCR
15.0000 mg | EXTENDED_RELEASE_TABLET | Freq: Two times a day (BID) | ORAL | Status: DC
  Administered 2013-12-10 – 2013-12-12 (×4): 15 mg via ORAL
  Filled 2013-12-10 (×4): qty 1

## 2013-12-10 MED ORDER — DEXTROSE 5 % IV SOLN
1.0000 g | Freq: Three times a day (TID) | INTRAVENOUS | Status: DC
  Administered 2013-12-10 – 2013-12-12 (×6): 1 g via INTRAVENOUS
  Filled 2013-12-10 (×10): qty 1

## 2013-12-10 MED ORDER — MORPHINE SULFATE 15 MG PO TABS
7.5000 mg | ORAL_TABLET | ORAL | Status: DC | PRN
  Administered 2013-12-12: 7.5 mg via ORAL
  Filled 2013-12-10: qty 1

## 2013-12-10 MED ORDER — FUROSEMIDE 10 MG/ML IJ SOLN
40.0000 mg | Freq: Once | INTRAMUSCULAR | Status: AC
  Administered 2013-12-10: 40 mg via INTRAVENOUS
  Filled 2013-12-10: qty 4

## 2013-12-10 MED ORDER — DM-GUAIFENESIN ER 30-600 MG PO TB12
1.0000 | ORAL_TABLET | Freq: Two times a day (BID) | ORAL | Status: DC
Start: 2013-12-10 — End: 2013-12-12
  Administered 2013-12-10 – 2013-12-12 (×4): 1 via ORAL
  Filled 2013-12-10 (×6): qty 1

## 2013-12-10 MED ORDER — VANCOMYCIN HCL IN DEXTROSE 1-5 GM/200ML-% IV SOLN
1000.0000 mg | Freq: Three times a day (TID) | INTRAVENOUS | Status: DC
  Administered 2013-12-10 – 2013-12-11 (×3): 1000 mg via INTRAVENOUS
  Filled 2013-12-10 (×8): qty 200

## 2013-12-10 MED ORDER — WHITE PETROLATUM GEL
Status: AC
  Administered 2013-12-10: 0.2
  Filled 2013-12-10: qty 5

## 2013-12-10 NOTE — Evaluation (Signed)
Clinical/Bedside Swallow Evaluation Patient Details  Name: Debra [name] MRN: [id number] Date of Birth: 1971/06/01  Today's Date: 12/10/2013 Time: 7893-8101 SLP Time Calculation (min): 23 min  Past Medical History:  Past Medical History  Diagnosis Date  . Asthma   . Ulcerative colitis   . Anxiety   . Depression   . Chronic back pain   . Chronic knee pain   . Thyroid disease     hypo  . GERD (gastroesophageal reflux disease)   . Insomnia   . Allergic rhinitis   . Arthritis   . COPD (chronic obstructive pulmonary disease)    Past Surgical History:  Past Surgical History  Procedure Laterality Date  . Knee surgery      x3, two on left and one on right.  Arthoscopy  . Cholecystectomy    . Abdominal hysterectomy    . Cesarean section    . Tonsillectomy    . Joint replacement      2x on left, 1x on right   HPI:  Debra [name] is a 43 y.o. female with a past medical history of chronic obstructive pulmonary disease, history of recurrent pneumonia, ulcerative colitis, GERD, was admitted 4/23 wtih difficulty breathing, CXR indicative of RL PNA.   Assessment / Plan / Recommendation Clinical Impression  Pt's swallow overall appears to be within gross functional limits with adequate hyolaryngeal movement; however, pt does have a h/o respiratory issues with current decrease in respiratory function which may increase pt's risk for silent aspiration. Suspect that pt's risk may fluctuate due to decreased swallow/breath coordination during times of increased SOB, and pt was educated about the importance of using a slow rate and taking small bites/sips to reduce SOB during meals. Given recurrent h/o PNA, MD may wish to consider MBS to objectively r/o silent aspiration.    Aspiration Risk  Moderate    Diet Recommendation Regular;Thin liquid   Liquid Administration via: Cup;Straw;No straw Medication Administration: Whole meds with liquid Supervision: Patient able to self  feed;Intermittent supervision to cue for compensatory strategies Compensations: Slow rate;Small sips/bites Postural Changes and/or Swallow Maneuvers: Seated upright 90 degrees;Upright 30-60 min after meal    Other  Recommendations Oral Care Recommendations: Oral care BID   Follow Up Recommendations  None (none anticipated)    Frequency and Duration min 2x/week  1 week   Pertinent Vitals/Pain N/A    SLP Swallow Goals     Swallow Study Prior Functional Status       General Date of Onset: 12/09/13 HPI: Debra [name] is a 43 y.o. female with a past medical history of chronic obstructive pulmonary disease, history of recurrent pneumonia, ulcerative colitis, GERD, was admitted 4/23 wtih difficulty breathing, CXR indicative of RL PNA. Type of Study: Bedside swallow evaluation Previous Swallow Assessment: none in chart Diet Prior to this Study: Regular;Thin liquids Temperature Spikes Noted: No Respiratory Status: Nasal cannula (3L) History of Recent Intubation: No Behavior/Cognition: Alert;Cooperative;Pleasant mood Oral Cavity - Dentition: Adequate natural dentition (pt reports dental pain in posterior right side) Self-Feeding Abilities: Able to feed self Patient Positioning: Upright in bed Baseline Vocal Quality: Clear Volitional Cough: Strong Volitional Swallow: Able to elicit    Oral/Motor/Sensory Function Overall Oral Motor/Sensory Function: Appears within functional limits for tasks assessed   Ice Chips Ice chips: Not tested   Thin Liquid Thin Liquid: Impaired Presentation: Straw;Self Fed;Cup Pharyngeal  Phase Impairments: Cough - Delayed (delayed cough x1 on initial cup sip)    Nectar Thick Nectar Thick Liquid: Not  tested   Honey Thick Honey Thick Liquid: Not tested   Puree Puree: Within functional limits Presentation: Self Fed;Spoon   Solid   GO    Solid: Within functional limits Presentation: Self Fed        Debra [name], M.A.  CCC-SLP [phone]  Debra [name] 12/10/2013,4:26 PM

## 2013-12-10 NOTE — Consult Note (Signed)
Name: Debra [name] MRN: [id number] DOB: [birth year]    ADMISSION DATE:  12/09/2013 CONSULTATION DATE:  12/10/13  REFERRING MD :  Dr. Sherral Hammers PRIMARY SERVICE:  [facility]  CHIEF COMPLAINT:  Cough with sputum production, SOB  BRIEF PATIENT DESCRIPTION: 43 y/o F admitted on 4/22 per [facility] for PNA.  PCCM consulted for evaluation.    SIGNIFICANT EVENTS / STUDIES:  3/30 - ER visit to [facility] for cough, wheezing, SOB.  Rx with azithro.  CT with RL PNA ................................................................................................................................................... 4/22 - Admit to [facility] per [facility] for worsening SOB, cough with sputum production, PNA   CULTURES: BCx2 4/22>>>  ANTIBIOTICS: Rocephin 4/22>>> Azithro 4/22>>>  HISTORY OF PRESENT ILLNESS:  43 y/o F with PMH of anxiety / depression, GERD, hypothyroidism, arthritis, chronic back/knee pain, ulcerative colitis, COPD and recent (3/30) ER visit for cough, shortness of breath & wheezing.  At that time, CT of chest demonstrated a RLL PNA.   She was treated with oral azithromycin.  Patient presented to [facility] ER on 4/22 with a week long history of worsening cough with yellow to brown sputum production, wheezing, shortness of breath and DOE.  Patient attempted home breathing treatments and oxygen without improvement in symptoms.  She also reported tight chest pain that was worse with inspiration, nausea, vomiting, abdominal pain & generalized weakness.  She denies hemoptysis, fevers, chills, headaches, syncope / pre-syncope, swelling and nasal drainage.    PAST MEDICAL HISTORY :  Past Medical History  Diagnosis Date  . Asthma   . Ulcerative colitis   . Anxiety   . Depression   . Chronic back pain   . Chronic knee pain   . Thyroid disease     hypo  . GERD (gastroesophageal reflux disease)   . Insomnia   . Allergic rhinitis   . Arthritis   . COPD (chronic obstructive pulmonary disease)    Past Surgical History    Procedure Laterality Date  . Knee surgery      x3, two on left and one on right.  Arthoscopy  . Cholecystectomy    . Abdominal hysterectomy    . Cesarean section    . Tonsillectomy    . Joint replacement      2x on left, 1x on right   Prior to Admission medications   Medication Sig Start Date End Date Taking? Authorizing Provider  albuterol (PROVENTIL HFA;VENTOLIN HFA) 108 (90 BASE) MCG/ACT inhaler Inhale 2 puffs into the lungs every 6 (six) hours as needed for wheezing.   Yes Historical Provider, MD  ALPRAZolam Duanne Moron) 1 MG tablet Take 0.5 mg by mouth 3 (three) times daily as needed for anxiety.    Yes Historical Provider, MD  buPROPion (WELLBUTRIN XL) 300 MG 24 hr tablet Take 300 mg by mouth every morning.    Yes Historical Provider, MD  cholecalciferol (VITAMIN D) 1000 UNITS tablet Take 1,000 Units by mouth every morning.   Yes Historical Provider, MD  citalopram (CELEXA) 40 MG tablet Take 40 mg by mouth every morning.    Yes Historical Provider, MD  Fluticasone-Salmeterol (ADVAIR) 500-50 MCG/DOSE AEPB Inhale 1 puff into the lungs every 12 (twelve) hours.   Yes Historical Provider, MD  furosemide (LASIX) 20 MG tablet Take 20 mg by mouth every morning.   Yes Historical Provider, MD  ipratropium-albuterol (DUONEB) 0.5-2.5 (3) MG/3ML SOLN Take 3 mLs by nebulization every 6 (six) hours as needed (for wheezing).   Yes Historical Provider, MD  levothyroxine (SYNTHROID, LEVOTHROID) 150 MCG tablet  Take 150 mcg by mouth daily before breakfast.   Yes Historical Provider, MD  montelukast (SINGULAIR) 10 MG tablet Take 10 mg by mouth at bedtime.    Yes Historical Provider, MD  morphine (MS CONTIN) 30 MG 12 hr tablet Take 1 tablet (30 mg total) by mouth 2 (two) times daily. 04/04/13  Yes Janece Canterbury, MD  morphine (MSIR) 15 MG tablet Take 15 mg by mouth daily as needed for severe pain.   Yes Historical Provider, MD  omeprazole (PRILOSEC) 40 MG capsule Take 40 mg by mouth every morning.    Yes  Historical Provider, MD  promethazine (PHENERGAN) 25 MG tablet Take 1 tablet (25 mg total) by mouth every 6 (six) hours as needed for nausea. 10/26/13  Yes Garald Balding, NP  traZODone (DESYREL) 100 MG tablet Take 100 mg by mouth at bedtime as needed for sleep.    Yes Historical Provider, MD   Allergies  Allergen Reactions  . Penicillins Swelling and Rash    FAMILY HISTORY:  Family History  Problem Relation Age of Onset  . High blood pressure Mother   . Bipolar disorder Mother   . Asthma Father   . Parkinson's disease Father    SOCIAL HISTORY:  reports that she quit smoking about 2 years ago. Her smoking use included Cigarettes. She has a 3 pack-year smoking history. She has never used smokeless tobacco. She reports that she drinks alcohol. She reports that she does not use illicit drugs.  REVIEW OF SYSTEMS:   Constitutional: Negative for fever, chills, weight loss, malaise/fatigue and diaphoresis.  HENT: Negative for hearing loss, ear pain, nosebleeds, congestion, sore throat, neck pain, tinnitus and ear discharge.   Eyes: Negative for blurred vision, double vision, photophobia, pain, discharge and redness.  Respiratory: Negative for stridor.  SEE HPI Cardiovascular: Negative for chest pain, palpitations, orthopnea, claudication, leg swelling and PND.  Gastrointestinal: Negative for heartburn, nausea, vomiting, abdominal pain, diarrhea, constipation, blood in stool and melena.  Genitourinary: Negative for dysuria, urgency, frequency, hematuria and flank pain.  Musculoskeletal: Negative for myalgias, back pain, joint pain and falls.  Skin: Negative for itching and rash.  Neurological: Negative for dizziness, tingling, tremors, sensory change, speech change, focal weakness, seizures, loss of consciousness, weakness and headaches.  Endo/Heme/Allergies: Negative for environmental allergies and polydipsia. Does not bruise/bleed easily.  SUBJECTIVE:   VITAL SIGNS: Temp:  [97.9 F (36.6  C)-98.4 F (36.9 C)] 98.1 F (36.7 C) (04/23 1156) Pulse Rate:  [71-83] 83 (04/22 2000) Resp:  [9-17] 16 (04/23 0317) BP: (99-139)/(49-84) 120/49 mmHg (04/23 1156) SpO2:  [86 %-97 %] 95 % (04/23 1156) Weight:  [204 lb 9.4 oz (92.8 kg)] 204 lb 9.4 oz (92.8 kg) (04/23 0317)  PHYSICAL EXAMINATION: General:  Morbidly obese in NAD Neuro:  AAOx4, speech clear, MAE HEENT:  Mm pink/moist, no jvd Cardiovascular:  s1s2 rrr, no m/r/g Lungs:  resp's even/non-labored, diffuse wheezing bilaterally  Abdomen:  Round/soft, bsx4 active Musculoskeletal:  No acute deformties Skin:  Warm/dry, no edema   Recent Labs Lab 12/09/13 0520 12/09/13 1330 12/10/13 0303  NA 142  --  140  K 4.0  --  4.5  CL 101  --  105  CO2 28  --  23  BUN 8  --  11  CREATININE 1.00 0.97 0.79  GLUCOSE 102*  --  151*    Recent Labs Lab 12/09/13 0410 12/09/13 1330 12/10/13 0303  HGB 11.6* 10.5* 9.3*  HCT 38.3 34.8* 31.4*  WBC 12.0* 20.2*  14.3*  PLT 277 320 283   Dg Chest 2 View  12/10/2013   CLINICAL DATA:  Followup pneumonia  EXAM: CHEST  2 VIEW  COMPARISON:  12/09/2013  FINDINGS: Cardiomediastinal silhouette is stable. Improvement in aeration. Residual streaky infiltrate right perihilar and right upper lobe. Linear atelectasis or scarring in the lingula. No pulmonary edema.  IMPRESSION: Residual streaky infiltrate in right perihilar region and right upper lobe with improvement in aeration. Follow-up to complete resolution is recommended. Linear atelectasis or scarring in the lingula. No pulmonary edema.   Electronically Signed   By: Lahoma Crocker M.D.   On: 12/10/2013 08:13   Ct Angio Chest Pe W/cm &/or Wo Cm  12/09/2013   CLINICAL DATA:  Shortness of breath, wheezing, abdominal pain, nausea, and vomiting.  EXAM: CT ANGIOGRAPHY CHEST WITH CONTRAST  TECHNIQUE: Multidetector CT imaging of the chest was performed using the standard protocol during bolus administration of intravenous contrast. Multiplanar CT image  reconstructions and MIPs were obtained to evaluate the vascular anatomy.  CONTRAST:  20mL OMNIPAQUE IOHEXOL 300 MG/ML SOLN, 175mL OMNIPAQUE IOHEXOL 350 MG/ML SOLN  COMPARISON:  04/01/2013  FINDINGS: The contrast bolus is somewhat limited but there is moderately good opacification of the central and segmental pulmonary arteries. No focal filling defects are identified. No evidence of significant pulmonary embolus. Mild prominence of lymph nodes in the right hilum and mediastinum without pathologic enlargement. These are likely reactive. The heart size is mildly increased. Normal caliber thoracic aorta. There is a small esophageal hiatal hernia. Residual contrast material throughout the esophagus suggest reflux or dysmotility. Patchy airspace disease throughout the right lung and in the left lung base most suggestive of multifocal pneumonia. Appearance is worse is seen in the previous study there is a 7 mm nodule in the right lung base posteriorly that is stable since the previous study. Chest x-rays obtained between the 2 studies suggest that the infiltrative changes are recurrent rather than persistent. No pleural effusions. Visualized portions of the upper abdominal organs are unremarkable.  Review of the MIP images confirms the above findings.  IMPRESSION: No evidence of significant pulmonary embolus. Airspace disease throughout the right lung and in the left lower lung consistent with multifocal pneumonia.   Electronically Signed   By: Lucienne Capers M.D.   On: 12/09/2013 06:03   Ct Abdomen Pelvis W Contrast  12/09/2013   CLINICAL DATA:  Shortness of breath, wheezing, abdominal pain, nausea, vomiting. History of ulcerative colitis.  EXAM: CT ABDOMEN AND PELVIS WITH CONTRAST  TECHNIQUE: Multidetector CT imaging of the abdomen and pelvis was performed using the standard protocol following bolus administration of intravenous contrast.  CONTRAST:  33mL OMNIPAQUE IOHEXOL 300 MG/ML SOLN, 135mL OMNIPAQUE IOHEXOL  350 MG/ML SOLN  COMPARISON:  11/16/2013  FINDINGS: Consolidation in the right lung base as demonstrated on CT chest obtained at the same time. Small esophageal hiatal hernia.  Surgical absence of the gallbladder. The liver, spleen, pancreas, adrenal glands, kidneys, abdominal aorta, inferior vena cava, visualize small bowel, and colon are unremarkable. There is mild prominence of retroperitoneal lymph nodes with a a aorta caval lymph node measuring up to about 15 mm diameter. This is stable since previous study. No free air or free fluid in the abdomen.  Pelvis: Fluid collections in the pelvis are similar to previous study, previously indicated as hydrosalpinx. Uterus appears to be surgically absent. Ovaries are not enlarged. Appendix is normal. No evidence of diverticulitis. Bladder wall is not thickened. No destructive bone lesions.  IMPRESSION: No significant changes since previous study in the abdomen or pelvis. Progression of consolidative changes in the right lung base probably representing recurrent pneumonia.   Electronically Signed   By: Lucienne Capers M.D.   On: 12/09/2013 06:12   Dg Chest Port 1 View  12/09/2013   CLINICAL DATA:  Shortness of breath, wheezing, abdominal pain, nausea, vomiting age.  EXAM: PORTABLE CHEST - 1 VIEW  COMPARISON:  DG CHEST 1V PORT dated 09/20/2013  FINDINGS: Shallow inspiration. Mild cardiac enlargement and pulmonary vascular congestion. Patchy nodular airspace disease in the upper lungs, predominantly on the right. Changes likely represent pulmonary edema although multifocal pneumonia could also have this appearance. No blunting of costophrenic angles.  IMPRESSION: Cardiac enlargement with mild pulmonary vascular congestion. Patchy airspace disease in the upper lungs, greater on the right.   Electronically Signed   By: Lucienne Capers M.D.   On: 12/09/2013 04:43    ASSESSMENT / PLAN:  HCAP - Recurrent pneumonia with recent course of azithro   Dyspnea  R/O CHF    Plan: - PFT's - assess HIV status - assess sputum, ECHO  - swallow evaluation - 40 mg Lasix x1 - KVO IVF's - adjust abx to treat as HCAP as recent course of azithro  - BD's  - Pulmonary hygiene - Titrate O2 for sats > 92% - Steroids as ordered   Rush Farmer, M.D. [facility] Pulmonary/Critical Care Medicine. Pager: [phone]. After hours pager: [phone].   12/10/2013, 1:17 PM

## 2013-12-10 NOTE — ED Notes (Signed)
Spoke with Evalyn Casco, A. Basie, r/t patient medications.  Both nurses state that the patient did not have any belongings with her on arrival except her pajamas.  States patient did not have a bag of medications.

## 2013-12-10 NOTE — Progress Notes (Signed)
  Echocardiogram 2D Echocardiogram has been performed.  Debra [name] 12/10/2013, 4:28 PM

## 2013-12-10 NOTE — Consult Note (Signed)
ANTIBIOTIC CONSULT NOTE - INITIAL  Pharmacy Consult for Vancomycin and Aztreonam Indication: HCAP  Allergies  Allergen Reactions  . Penicillins Swelling and Rash    Patient Measurements: Height: 5\' 2"  (157.5 cm) Weight: 204 lb 9.4 oz (92.8 kg) IBW/kg (Calculated) : 50.1  Vital Signs: Temp: 98.1 F (36.7 C) (04/23 1156) Temp src: Oral (04/23 1156) BP: 120/49 mmHg (04/23 1156) Intake/Output from previous day: 04/22 0701 - 04/23 0700 In: 2200 [P.O.:400; I.V.:1500; IV Piggyback:300] Out: 2300 [Urine:2300] Intake/Output from this shift: Total I/O In: 315 [P.O.:240; I.V.:75] Out: -   Labs:  Recent Labs  12/09/13 0410 12/09/13 0520 12/09/13 1330 12/10/13 0303  WBC 12.0*  --  20.2* 14.3*  HGB 11.6*  --  10.5* 9.3*  PLT 277  --  320 283  CREATININE  --  1.00 0.97 0.79   Estimated Creatinine Clearance: 95.2 ml/min (by C-G formula based on Cr of 0.79).  Microbiology: Recent Results (from the past 720 hour(s))  CULTURE, BLOOD (ROUTINE X 2)     Status: None   Collection Time    12/09/13  5:20 AM      Result Value Ref Range Status   Specimen Description BLOOD RIGHT ARM   Final   Special Requests BOTTLES DRAWN AEROBIC AND ANAEROBIC 10CC EACH   Final   Culture  Setup Time     Final   Value: 12/09/2013 08:26     Performed at [facility]   Culture     Final   Value:        BLOOD CULTURE RECEIVED NO GROWTH TO DATE CULTURE WILL BE HELD FOR 5 DAYS BEFORE ISSUING A FINAL NEGATIVE REPORT     Performed at [facility]   Report Status PENDING   Incomplete  MRSA PCR SCREENING     Status: None   Collection Time    12/09/13  9:17 AM      Result Value Ref Range Status   MRSA by PCR NEGATIVE  NEGATIVE Final   Comment:            The GeneXpert MRSA Assay (FDA     approved for NASAL specimens     only), is one component of a     comprehensive MRSA colonization     surveillance program. It is not     intended to diagnose MRSA     infection nor to guide or   monitor treatment for     MRSA infections.    Medical History: Past Medical History  Diagnosis Date  . Asthma   . Ulcerative colitis   . Anxiety   . Depression   . Chronic back pain   . Chronic knee pain   . Thyroid disease     hypo  . GERD (gastroesophageal reflux disease)   . Insomnia   . Allergic rhinitis   . Arthritis   . COPD (chronic obstructive pulmonary disease)    Assessment: 54yof recently discharged from [place] [facility] with azithromycin for RL pneumonia. Returns today with worsening SOB, cough, and sputum production. CXR shows residual streaky infiltrate in right region. Antibiotics will be broadened to cover for HCAP. Renal function wnl.  4/22 Cefepime x 1 4/22 Ceftriaxone x 1 4/22 IV Azithromycin>>4/23 4/23 Vancomycin>> 4/23 Aztreonam>>  4/22 blood cx>>  Goal of Therapy:  Vancomycin trough level 15-20 mcg/ml  Plan:  1) Vancomycin 1g IV q8 2) Aztreonam 1g IV q8 3) Follow renal function, cultures, LOT, level as needed  Benjamine Sprague Lav[place] President 12/10/2013,1:48 PM

## 2013-12-10 NOTE — Progress Notes (Signed)
[place] TEAM 1 - Stepdown/ICU TEAM Progress Note  Debra [name] QZR:[id number] DOB: 1971/02/11 DOA: 12/09/2013 PCP: Guadlupe Spanish, MD  Admit HPI / Brief Narrative: 43 y.o. 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 She reports progressively worsening cough, wheezing, shortness of breath over the past week. Symptoms have worsened despite taking her home breathing treatments and using supplemental oxygen. Cough is associated with yellow to brown sputum production, and is nonbloody. She also complains of retrosternal chest pain characterized as tight, worsened by deep inspiration and cough. Patient also complains of nausea and vomiting, abdominal pain, generalized weakness, malaise and fatigue. She was found to have an O2 sat of 88% with respiratory distress at [facility]. She had an ER visit on 11/16/2013 at which time she was prescribed azithromycin. CT scan of the chest performed at [facility] showing consolidative changes in the right lung base which likely represent recurrent pneumonia.   HPI/Subjective: 4/23 states SOB improved, however not at baseline. Negative CP. States chronic pain uncontrolled home regimen is morphine 30 mg BID, +15 mg  For breakthrough pain managed by her PCP. States has not seen pulmonologist or pain specialist secondary to losing her health insurance over a year ago. Also states recently diagnosed with fallopian tube inflammation, and was scheduled to see GYN.  Assessment/Plan:  HCAP -Patient was started on azithromycin + ceftriaxone however patient's story of multiple rounds of antibiotics with failure to resolve is more  consistent with COP/DIP/ITP -Have consulted pulmonology for BAL and recommendations. -ADDENDUM; per pulmonology azithromycin and ceftriaxone DC'd; aztreonam+ vancomycin started -Mucinex DM BID -Flutter valve q 4hr while awake   Acute hypoxic respiratory failure -Most likely secondary to COP vs COPD exacerbation vs CAP -Patient had been started on Solu-Medrol IV 60 mg q6hr for now will continue until recommendations from pulmonology  COPD -Continue Singulair -Continue DuoNeb -Continue Dulera  Chronic pain syndrome -Morphine (MS Contin) 30 mg BID (home dose); will start patient back at lower dose 15 mg BID. -Morphine IR 15 mg for breakthrough pain; will start patient back at lower dose  Depression -Restart patient's Wellbutrin 300 mg daily -Celexa 40 mg daily  Anxiety -Will restart patient's home dose of Xanax 0.5 mg TID PRN   Anemia -Most likely iron deficiency but will perform anemia workup        Code Status: FULL Family Communication: no family present at time of exam Disposition Plan: Resolution CAP   Consultants: Dr Jennet Maduro (PCCM)  Procedure/Significant Events: Echocardiogram 12/10/2013 -Left ventricle: The cavity size was normal.  -LVEF=  55% to 60%.   CT abdomen pelvis with contrast 11/16/2013 -Progressive nodular opacities RML/RLL superimposed on a background of diffuse tree-in-bud micro nodularity concerning for progression of the underlying infectious/ inflammatory process.  - Persistent Nonspecific retroperitoneal adenopathy (lymphoma vs metastatic disease vs Reactive or granulomatous nodes - Stable probable hydrosalpinx in the region of the right adnexa.  -. Stable peritoneal implants versus endometrial implants in the cul-de-sac of [place].    CT angiogram chest PE protocol 12/09/2013  -Negative  pulmonary embolus. - Airspace disease throughout the right lung/left lower lung consistent with multifocal pneumonia.  CXR 12/10/2013 -Residual streaky  infiltrate in right perihilar region/right upper lobe  with improvement in aeration. -Linear atelectasis or scarring in the lingula. .    Culture -4/22 blood culture right forearm negative -4/22 MRSA by PCR negative   Antibiotics: Azithromycin 4/22>> stopped 4/23 Ceftriaxone 4/22>> stopped 4/23 Aztreonam 4/23>> Vancomycin 4/23>>    DVT prophylaxis: Lovenox daily   Devices   LINES / TUBES:  4/22 22 ga right forearm    Continuous Infusions:    Objective: VITAL SIGNS: Temp: 98.9 F (37.2 C) (04/23 1700) Temp src: Oral (04/23 1700) BP: 109/52 mmHg (04/23 1700) SPO2; 95% on 3 L O2 FIO2:   Intake/Output Summary (Last 24 hours) at 12/10/13 1806 Last data filed at 12/10/13 0800  Gross per 24 hour  Intake   1290 ml  Output   1600 ml  Net   -310 ml     Exam: General: A./O. x4, mild discomfort secondary to chronic pain, SOB Lungs: Diffuse inspiratory/expiratory wheezing, decreased bibasilar breath sounds bilateral  Cardiovascular: Tachycardic, Regular rhythm without murmur gallop or rub normal S1 and S2 Renalbalance today;        /overall;        Creatinine ;     0.79   Hourly output   Abdomen: Nontender, nondistended, soft, bowel sounds positive, no rebound, no ascites, no appreciable mass Extremities: No significant cyanosis, clubbing, or edema bilateral lower extremities  Data Reviewed: Basic Metabolic Panel:  Recent Labs Lab 12/09/13 0520 12/09/13 1330 12/10/13 0303  NA 142  --  140  K 4.0  --  4.5  CL 101  --  105  CO2 28  --  23  GLUCOSE 102*  --  151*  BUN 8  --  11  CREATININE 1.00 0.97 0.79  CALCIUM 9.8  --  9.3   Liver Function Tests:  Recent Labs Lab 12/09/13 0520  AST 14  ALT 9  ALKPHOS 76  BILITOT 0.7  PROT 8.1  ALBUMIN 4.1    Recent Labs Lab 12/09/13 0520  LIPASE 31   No results found for this basename: AMMONIA,  in the last 168 hours CBC:  Recent Labs Lab 12/09/13 0410 12/09/13 1330 12/10/13 0303  WBC 12.0* 20.2*  14.3*  NEUTROABS 10.2*  --   --   HGB 11.6* 10.5* 9.3*  HCT 38.3 34.8* 31.4*  MCV 80.8 80.0 80.1  PLT 277 320 283   Cardiac Enzymes:  Recent Labs Lab 12/09/13 1340  TROPONINI <0.30   BNP (last 3 results)  Recent Labs  12/09/13 0520 12/10/13 1430  PROBNP 67.9 597.2*   CBG: No results found for this basename: GLUCAP,  in the last 168 hours  Recent Results (from the past 240 hour(s))  CULTURE, BLOOD (ROUTINE X 2)     Status: None   Collection Time    12/09/13  5:20 AM      Result Value Ref Range Status   Specimen Description BLOOD RIGHT ARM   Final   Special Requests BOTTLES DRAWN AEROBIC AND ANAEROBIC 10CC EACH   Final   Culture  Setup Time     Final   Value: 12/09/2013 08:26     Performed at [facility]   Culture     Final   Value:        BLOOD CULTURE RECEIVED NO GROWTH TO DATE CULTURE WILL BE HELD FOR 5 DAYS BEFORE ISSUING A FINAL NEGATIVE REPORT     Performed at [facility]   Report Status PENDING   Incomplete  MRSA PCR SCREENING  Status: None   Collection Time    12/09/13  9:17 AM      Result Value Ref Range Status   MRSA by PCR NEGATIVE  NEGATIVE Final   Comment:            The GeneXpert MRSA Assay (FDA     approved for NASAL specimens     only), is one component of a     comprehensive MRSA colonization     surveillance program. It is not     intended to diagnose MRSA     infection nor to guide or     monitor treatment for     MRSA infections.     Studies:  Recent x-ray studies have been reviewed in detail by the Attending Physician  Scheduled Meds:  Scheduled Meds: . aztreonam  1 g Intravenous 3 times per day  . buPROPion  300 mg Oral q morning - 10a  . citalopram  40 mg Oral q morning - 10a  . dextromethorphan-guaiFENesin  1 tablet Oral BID  . docusate sodium  100 mg Oral BID  . enoxaparin (LOVENOX) injection  40 mg Subcutaneous Q24H  . ipratropium-albuterol  3 mL Nebulization Q4H  . levothyroxine  150 mcg Oral QAC  breakfast  . methylPREDNISolone (SOLU-MEDROL) injection  60 mg Intravenous Q6H  . mometasone-formoterol  2 puff Inhalation BID  . montelukast  10 mg Oral QHS  . morphine  15 mg Oral Q12H  . sodium chloride  3 mL Intravenous Q12H  . vancomycin  1,000 mg Intravenous Q8H    Time spent on care of this patient:40 mins   Allie Bossier , MD   [facility] Office  [phone] Pager [phone]  On-Call/Text Page:      [email or web address]      password [email or web address]  If 7PM-7AM, please contact night-coverage [email or web address] Password [email or web address] 12/10/2013, 6:06 PM   LOS: 1 day

## 2013-12-10 NOTE — Progress Notes (Signed)
Pt stated that she had all of her home medications with her on admission at [facility].  No medications were brought to [facility], sent to pharmacy, sent home with family, or in room with belongings.  [facility] transport were both contacted about missing medications and no medications were found or turned in at this time.

## 2013-12-11 DIAGNOSIS — J189 Pneumonia, unspecified organism: Secondary | ICD-10-CM

## 2013-12-11 LAB — CBC WITH DIFFERENTIAL/PLATELET
BASOS PCT: 0 % (ref 0–1)
Basophils Absolute: 0 10*3/uL (ref 0.0–0.1)
EOS ABS: 0 10*3/uL (ref 0.0–0.7)
Eosinophils Relative: 0 % (ref 0–5)
HCT: 30.1 % — ABNORMAL LOW (ref 36.0–46.0)
Hemoglobin: 8.9 g/dL — ABNORMAL LOW (ref 12.0–15.0)
Lymphocytes Relative: 5 % — ABNORMAL LOW (ref 12–46)
Lymphs Abs: 0.5 10*3/uL — ABNORMAL LOW (ref 0.7–4.0)
MCH: 23.8 pg — AB (ref 26.0–34.0)
MCHC: 29.6 g/dL — AB (ref 30.0–36.0)
MCV: 80.5 fL (ref 78.0–100.0)
Monocytes Absolute: 0.1 10*3/uL (ref 0.1–1.0)
Monocytes Relative: 1 % — ABNORMAL LOW (ref 3–12)
NEUTROS PCT: 94 % — AB (ref 43–77)
Neutro Abs: 9.5 10*3/uL — ABNORMAL HIGH (ref 1.7–7.7)
PLATELETS: 263 10*3/uL (ref 150–400)
RBC: 3.74 MIL/uL — ABNORMAL LOW (ref 3.87–5.11)
RDW: 16.8 % — ABNORMAL HIGH (ref 11.5–15.5)
WBC: 10.1 10*3/uL (ref 4.0–10.5)

## 2013-12-11 LAB — EXPECTORATED SPUTUM ASSESSMENT W REFEX TO RESP CULTURE: SPECIAL REQUESTS: NORMAL

## 2013-12-11 LAB — COMPREHENSIVE METABOLIC PANEL
ALT: 6 U/L (ref 0–35)
AST: 8 U/L (ref 0–37)
Albumin: 3 g/dL — ABNORMAL LOW (ref 3.5–5.2)
Alkaline Phosphatase: 48 U/L (ref 39–117)
BUN: 11 mg/dL (ref 6–23)
CO2: 25 mEq/L (ref 19–32)
Calcium: 9 mg/dL (ref 8.4–10.5)
Chloride: 106 mEq/L (ref 96–112)
Creatinine, Ser: 0.76 mg/dL (ref 0.50–1.10)
GFR calc Af Amer: >90 mL/min (ref >90)
GLUCOSE: 122 mg/dL — AB (ref 70–99)
Potassium: 4 mEq/L (ref 3.7–5.3)
SODIUM: 144 meq/L (ref 137–147)
Total Bilirubin: 0.2 mg/dL — ABNORMAL LOW (ref 0.3–1.2)
Total Protein: 6.7 g/dL (ref 6.0–8.3)

## 2013-12-11 LAB — PROCALCITONIN: Procalcitonin: 0.89 ng/mL

## 2013-12-11 LAB — HAPTOGLOBIN: Haptoglobin: 213 mg/dL — ABNORMAL HIGH (ref 45–215)

## 2013-12-11 LAB — LACTIC ACID, PLASMA: LACTIC ACID, VENOUS: 2 mmol/L (ref 0.5–2.2)

## 2013-12-11 LAB — IRON AND TIBC
Iron: 10 ug/dL — ABNORMAL LOW (ref 42–135)
Saturation Ratios: 3 % — ABNORMAL LOW (ref 20–55)
TIBC: 367 ug/dL (ref 250–470)
UIBC: 357 ug/dL (ref 125–400)

## 2013-12-11 LAB — FOLATE: Folate: 14 ng/mL

## 2013-12-11 LAB — HIV ANTIBODY (ROUTINE TESTING W REFLEX): HIV 1&2 Ab, 4th Generation: NONREACTIVE

## 2013-12-11 LAB — EXPECTORATED SPUTUM ASSESSMENT W GRAM STAIN, RFLX TO RESP C

## 2013-12-11 LAB — FERRITIN: Ferritin: 53 ng/mL (ref 10–291)

## 2013-12-11 LAB — VITAMIN B12: VITAMIN B 12: 529 pg/mL (ref 211–911)

## 2013-12-11 LAB — SEDIMENTATION RATE: Sed Rate: 25 mm/hr — ABNORMAL HIGH (ref 0–22)

## 2013-12-11 MED ORDER — SODIUM CHLORIDE 0.9 % IV SOLN
25.0000 mg | Freq: Once | INTRAVENOUS | Status: AC
  Administered 2013-12-11: 25 mg via INTRAVENOUS
  Filled 2013-12-11: qty 0.5

## 2013-12-11 MED ORDER — VANCOMYCIN HCL IN DEXTROSE 1-5 GM/200ML-% IV SOLN
1000.0000 mg | Freq: Three times a day (TID) | INTRAVENOUS | Status: DC
  Administered 2013-12-11 – 2013-12-12 (×2): 1000 mg via INTRAVENOUS
  Filled 2013-12-11 (×4): qty 200

## 2013-12-11 MED ORDER — PREDNISONE 50 MG PO TABS
50.0000 mg | ORAL_TABLET | Freq: Every day | ORAL | Status: DC
  Administered 2013-12-12: 50 mg via ORAL
  Filled 2013-12-11 (×2): qty 1

## 2013-12-11 MED ORDER — GI COCKTAIL ~~LOC~~
30.0000 mL | Freq: Four times a day (QID) | ORAL | Status: DC | PRN
  Administered 2013-12-11: 30 mL via ORAL
  Filled 2013-12-11: qty 30

## 2013-12-11 MED ORDER — PANTOPRAZOLE SODIUM 40 MG PO TBEC
40.0000 mg | DELAYED_RELEASE_TABLET | Freq: Two times a day (BID) | ORAL | Status: DC
Start: 2013-12-11 — End: 2013-12-12
  Administered 2013-12-11 – 2013-12-12 (×3): 40 mg via ORAL
  Filled 2013-12-11 (×3): qty 1

## 2013-12-11 MED ORDER — IRON DEXTRAN 50 MG/ML IJ SOLN
500.0000 mg | Freq: Once | INTRAMUSCULAR | Status: DC
  Filled 2013-12-11: qty 10

## 2013-12-11 MED ORDER — SODIUM CHLORIDE 0.9 % IV SOLN
1000.0000 mg | Freq: Once | INTRAVENOUS | Status: AC
  Administered 2013-12-11: 1000 mg via INTRAVENOUS
  Filled 2013-12-11 (×2): qty 20

## 2013-12-11 MED ORDER — MORPHINE SULFATE 2 MG/ML IJ SOLN
2.0000 mg | Freq: Three times a day (TID) | INTRAMUSCULAR | Status: DC | PRN
Start: 2013-12-11 — End: 2013-12-12
  Administered 2013-12-12: 2 mg via INTRAVENOUS
  Filled 2013-12-11: qty 1

## 2013-12-11 MED ORDER — ALBUTEROL SULFATE (2.5 MG/3ML) 0.083% IN NEBU
INHALATION_SOLUTION | RESPIRATORY_TRACT | Status: AC
  Administered 2013-12-11: 2.5 mg
  Filled 2013-12-11: qty 3

## 2013-12-11 NOTE — Progress Notes (Signed)
MEDICATION RELATED CONSULT NOTE - INITIAL   Pharmacy Consult for IV Iron Indication: iron deficiency anemia  Allergies  Allergen Reactions  . Penicillins Swelling and Rash    Patient Measurements: Height: 5\' 8"  (172.7 cm) Weight: 218 lb 14.7 oz (99.3 kg) IBW/kg (Calculated) : 63.9   Vital Signs: Temp: 98.4 F (36.9 C) (04/24 0825) Temp src: Oral (04/24 0825) BP: 110/69 mmHg (04/24 0825) Pulse Rate: 76 (04/24 0825) Intake/Output from previous day: 04/23 0701 - 04/24 0700 In: 565 [P.O.:240; I.V.:75; IV Piggyback:250] Out: 2500 [Urine:2500] Intake/Output from this shift: Total I/O In: 480 [P.O.:480] Out: 500 [Urine:500]  Labs:  Recent Labs  12/09/13 0520 12/09/13 1330 12/10/13 0303 12/11/13 0445  WBC  --  20.2* 14.3* 10.1  HGB  --  10.5* 9.3* 8.9*  HCT  --  34.8* 31.4* 30.1*  PLT  --  320 283 263  CREATININE 1.00 0.97 0.79 0.76  ALBUMIN 4.1  --   --  3.0*  PROT 8.1  --   --  6.7  AST 14  --   --  8  ALT 9  --   --  6  ALKPHOS 76  --   --  48  BILITOT 0.7  --   --  0.2*   Estimated Creatinine Clearance: 110.6 ml/min (by C-G formula based on Cr of 0.76).  Assessment: 3 YOF with no known renal issues admitted with recurrent PNA and started on antibiotics. An iron/anemia panel drawn last evening revealed low iron (10) and Tsat (3) with normal ferritin. To start IV iron for repletion. Used iron dextran calculation [dose in mL = 0.0442 x (desired Hgb - observed Hgb) x LBW + (0.26 x LBW)] in order to determine repletion amount. In this patient, her repletion amount is 33.42mL of iron dextran.  Goal of Therapy:  Correction of anemia  Plan:  1. Iron dextran 33.67mL x 50mg /mL = 1665mg  iron to be repleted. Rounding to the nearest 100mg , this would be a total of 1500mg . Maximum dose that can be given at one time is 1000mg . Therefore, today will give test dose + 1000mg  IV iron dextran infusion followed by 500mg  IV iron dextran infusion tomorrow 2. Hemoglobin levels will  not return to normal for a few weeks as concentration rises slowly. Pharmacy to sign off at this time. Please reconsult if needed.   Rosalba Totty D. Lyndall Windt, PharmD, BCPS Clinical Pharmacist Pager: [phone] 12/11/2013 11:48 AM

## 2013-12-11 NOTE — Progress Notes (Signed)
Speech Language Pathology Treatment: Dysphagia  Patient Details Name: Debra [name] MRN: [id number] DOB: December 02, 1970 Today's Date: 12/11/2013 Time: 1217-1239 SLP Time Calculation (min): 22 min  Assessment / Plan / Recommendation Clinical Impression  Pt continues to present with clinical indications of normal oropharyngeal swallow ability.  Pt does report taking omeprazole for 15 years- same dosage and self treating breakthrough reflux symptoms with baking soda at times.   She further states her reflux symptoms have been terrible in the hospital- SLP asked pt to speak to MD re: her reflux medication.    Of note, pt broke portion of her front tooth off last night, advised her to seek free dental clinic outside of hospital to see if her decayed dentition may be treated/removed, etc to aid pulmonary health.   Suspect pt's primary aspiration concerns are due to risk of secretion asp with medications, reflux and decayed dentition. SLP to sign off as all education completed, please reorder if desire.    HPI HPI: Debra [name] is a 43 y.o. female with a past medical history of chronic obstructive pulmonary disease, history of recurrent pneumonia, ulcerative colitis, GERD, was admitted 4/23 wtih difficulty breathing, CXR indicative of RL PNA.  Pt has h/o mold exposure and has been treated but reports now she has no insurance and can not afford her medications.  BSE completed yesterday and today follow up indicated to assess tolerance and for education.     Pertinent Vitals Afebrile, decreased  SLP Plan  All goals met    Recommendations Diet recommendations: Regular;Thin liquid Liquids provided via: Cup;Straw Medication Administration: Whole meds with liquid Supervision: Patient able to self feed Compensations: Slow rate;Small sips/bites Postural Changes and/or Swallow Maneuvers: Seated upright 90 degrees;Upright 30-60 min after meal              Oral Care Recommendations: Oral care BID Follow  up Recommendations: None Plan: All goals met    [place], [place] [facility] SLP [phone]

## 2013-12-11 NOTE — Consult Note (Signed)
Name: Debra [name] MRN: [id number] DOB: 06/13/1971    ADMISSION DATE:  12/09/2013 CONSULTATION DATE:  12/10/13  REFERRING MD :  Dr. Sherral Hammers PRIMARY SERVICE:  TRH  CHIEF COMPLAINT:  Cough with sputum production, SOB  BRIEF PATIENT DESCRIPTION:    43 y/o F with PMH of anxiety / depression, GERD, hypothyroidism, arthritis, chronic back/knee pain, ulcerative colitis, COPD and recent (3/30) ER visit for cough, shortness of breath & wheezing.  At that time, CT of chest demonstrated a RLL PNA.   She was treated with oral azithromycin.  Patient presented to [facility] ER on 4/22 with a week long history of worsening cough with yellow to brown sputum production, wheezing, shortness of breath and DOE.  Patient attempted home breathing treatments and oxygen without improvement in symptoms.  She also reported tight chest pain that was worse with inspiration, nausea, vomiting, abdominal pain & generalized weakness.  She denies hemoptysis, fevers, chills, headaches, syncope / pre-syncope, swelling and nasal drainage.     SIGNIFICANT EVENTS / STUDIES:  3/30 - ER visit to [facility] for cough, wheezing, SOB.  Rx with azithro.  CT with RL PNA ................................................................................................................................................... 4/22 - Admit to [facility] per [facility] for worsening SOB, cough with sputum production, PNA   CULTURES: BCx2 4/22>>> HIV 4/23 - NEGATIVE  AUTOIMMUNE ESER 4/24 >. ANA, RF, CCP, scl 70, ssA/SSb, Aldolase, ANCA screen, PR-3 4/24 >>  ANTIBIOTICS: Rocephin 4/22>>> Azithro 4/22>>>  Anti-infectives   Start     Dose/Rate Route Frequency Ordered Stop   12/10/13 1500  aztreonam (AZACTAM) 1 g in dextrose 5 % 50 mL IVPB     1 g 100 mL/hr over 30 Minutes Intravenous 3 times per day 12/10/13 1358     12/10/13 1500  vancomycin (VANCOCIN) IVPB 1000 mg/200 mL premix     1,000 mg 200 mL/hr over 60 Minutes Intravenous Every 8 hours 12/10/13  1358     12/09/13 1200  cefTRIAXone (ROCEPHIN) 1 g in dextrose 5 % 50 mL IVPB  Status:  Discontinued     1 g 100 mL/hr over 30 Minutes Intravenous Every 24 hours 12/09/13 1015 12/10/13 1337   12/09/13 1100  azithromycin (ZITHROMAX) 500 mg in dextrose 5 % 250 mL IVPB  Status:  Discontinued     500 mg 250 mL/hr over 60 Minutes Intravenous Every 24 hours 12/09/13 1015 12/10/13 1337   12/09/13 0641  ceFEPIme (MAXIPIME) 1 G injection    Comments:  Conchita Paris   : cabinet override      12/09/13 0641 12/09/13 0650   12/09/13 0630  vancomycin (VANCOCIN) 2,000 mg in sodium chloride 0.9 % 500 mL IVPB     2,000 mg 250 mL/hr over 120 Minutes Intravenous  Once 12/09/13 0621 12/09/13 0944   12/09/13 0630  ceFEPIme (MAXIPIME) 1 g in dextrose 5 % 50 mL IVPB  Status:  Discontinued     1 g 100 mL/hr over 30 Minutes Intravenous 3 times per day 12/09/13 6761 12/09/13 1015       SUBJECTIVE:   12/11/13: d/w patient: Hx retake. Had lived [place]  4 [place] with lot of mold exposure.  She has been having recurrent pneumonia since oct 2013. In May 2014 was admitted apparently to [facility] (care access denies she was a patient there) and nearly intubated. After this admisson she moved to a new place without mold. Was on chronic prednisone June 2014 to Dec 2014 and was told she has both copd  and mold related pneumonia. She recollects bronchoscopy. Today, she passed swallow evaluation. Denies autoimmune diagnosis. Denies current bird, mold, mildew, humidifier exposuire  VITAL SIGNS: Temp:  [97.9 F (36.6 C)-98.9 F (37.2 C)] 98.4 F (36.9 C) (04/24 0825) Pulse Rate:  [58-76] 76 (04/24 0825) Resp:  [18-20] 18 (04/24 0825) BP: (109-127)/(49-80) 110/69 mmHg (04/24 0825) SpO2:  [92 %-97 %] 96 % (04/24 0825) Weight:  [99.3 kg (218 lb 14.7 oz)] 99.3 kg (218 lb 14.7 oz) (04/23 2036)  PHYSICAL EXAMINATION: General:  Morbidly obese in NAD Neuro:  AAOx4, speech clear, MAE HEENT:  Mm pink/moist, no  jvd Cardiovascular:  s1s2 rrr, no m/r/g Lungs:  resp's even/non-labored, diffuse wheezing bilaterally  Abdomen:  Round/soft, bsx4 active Musculoskeletal:  No acute deformties Skin:  Warm/dry, no edema   PULMONARY  Recent Labs Lab 12/09/13 0429  PHART 7.375  PCO2ART 41.8  PO2ART 52.0*  HCO3 24.3*  TCO2 26  O2SAT 84.0    CBC  Recent Labs Lab 12/09/13 1330 12/10/13 0303 12/11/13 0445  HGB 10.5* 9.3* 8.9*  HCT 34.8* 31.4* 30.1*  WBC 20.2* 14.3* 10.1  PLT 320 283 263    COAGULATION No results found for this basename: INR,  in the last 168 hours  CARDIAC   Recent Labs Lab 12/09/13 1340  TROPONINI <0.30    Recent Labs Lab 12/09/13 0520 12/10/13 1430  PROBNP 67.9 597.2*     CHEMISTRY  Recent Labs Lab 12/09/13 0520 12/09/13 1330 12/10/13 0303 12/11/13 0445  NA 142  --  140 144  K 4.0  --  4.5 4.0  CL 101  --  105 106  CO2 28  --  23 25  GLUCOSE 102*  --  151* 122*  BUN 8  --  11 11  CREATININE 1.00 0.97 0.79 0.76  CALCIUM 9.8  --  9.3 9.0   Estimated Creatinine Clearance: 110.6 ml/min (by C-G formula based on Cr of 0.76).   LIVER  Recent Labs Lab 12/09/13 0520 12/11/13 0445  AST 14 8  ALT 9 6  ALKPHOS 76 48  BILITOT 0.7 0.2*  PROT 8.1 6.7  ALBUMIN 4.1 3.0*     INFECTIOUS  Recent Labs Lab 12/09/13 0429 12/11/13 0445  LATICACIDVEN 2.46* 2.0     ENDOCRINE CBG (last 3)  No results found for this basename: GLUCAP,  in the last 72 hours       IMAGING x48h  Dg Chest 2 View  12/10/2013   CLINICAL DATA:  Followup pneumonia  EXAM: CHEST  2 VIEW  COMPARISON:  12/09/2013  FINDINGS: Cardiomediastinal silhouette is stable. Improvement in aeration. Residual streaky infiltrate right perihilar and right upper lobe. Linear atelectasis or scarring in the lingula. No pulmonary edema.  IMPRESSION: Residual streaky infiltrate in right perihilar region and right upper lobe with improvement in aeration. Follow-up to complete resolution is  recommended. Linear atelectasis or scarring in the lingula. No pulmonary edema.   Electronically Signed   By: Lahoma Crocker M.D.   On: 12/10/2013 08:13      ASSESSMENT / PLAN:   Recurrent pneumonia with recent course of azithro  . Passed swallow evaluation.  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 Brand Males, M.D., [facility].C.P Pulmonary and Critical Care Medicine Staff Physician [facility] Pager: [phone], If no answer or between  15:00h - 7:00h: call [phone]  12/11/2013 12:02 PM

## 2013-12-11 NOTE — Progress Notes (Signed)
Speech signing off on patient.  No swallowing difficulties identified.

## 2013-12-11 NOTE — Progress Notes (Signed)
Progress Note  Debra [name] [id number] DOB: [birth year] DOA: 12/09/2013 PCP: Debra Spanish, MD  Admit HPI / Brief Narrative: 43 y.o. WF PMHx anxiety chronic pain syndrome secondary to being run over by tractor (required multiple surgeries), GERD, chronic obstructive pulmonary disease, Hx Recurrent PNA since Oct 2014 (treated with multiple rounds of antibiotics without full resolution),  ulcerative colitis, uses supplemental oxygen at bedtime, presents as a transfer from [place]. She reports progressively worsening cough, wheezing, shortness of breath over the past week. Symptoms have worsened despite taking her home breathing treatments and using supplemental oxygen. Cough is associated with yellow to brown sputum production, and is nonbloody. She also complains of retrosternal chest pain characterized as tight, worsened by deep inspiration and cough. Patient also complains of nausea and vomiting, abdominal pain, generalized weakness, malaise and fatigue. She was found to have an O2 sat of 88% with respiratory distress at [facility]. She had an ER visit on 11/16/2013 at which time she was prescribed azithromycin. CT scan of the chest performed at [facility] showing consolidative changes in the right lung base which likely represent recurrent pneumonia.    HPI/Subjective:  Debra [name] in bed appears comfortable, no headache chest pain, chronic abdominal pain which is unchanged, states recently diagnosed with fallopian tube inflammation, and was scheduled to see GYN. Shortness of breath much improved, no diarrhea blood or mucus in stool. No unintentional weight loss.  Assessment/Plan:    HCAP  Outpatient treatment, has had a few pneumonia bouts recently, seen by pulmonary here. HIV status is negative. Has responded well to HCAP antibiotics.  We'll try to minimize narcotics to rule out the chances of silent aspiration due to sedation. Patient counseled for  the same.  Will await pulmonary's input for any further change in plan. If stable may discharge in the morning.     Acute hypoxic respiratory failure Due to accommodation of pneumonia and mild COPD exacerbation. Breath sounds much improved and she is clinically much improved. We'll taper to oral steroids. Antibiotics as above. Nebulizer treatments as needed. Try to taper off oxygen. Of note patient uses nighttime oxygen at home.    COPD -Continue Singulair -Continue DuoNeb -Continue Dulera     Chronic pain syndrome -Morphine (MS Contin) 30 mg BID (home dose); will start patient back at lower dose 15 mg BID. -Morphine IR 15 mg for breakthrough pain; will start patient back at lower dose She was counseled not to overuse narcotics.    Depression -Restart patient's Wellbutrin 300 mg daily -Celexa 40 mg daily     Anxiety -Will restart patient's home dose of Xanax 0.5 mg TID PRN      Anemia -Most likely iron deficiency, will give IV iron. Will need outpatient followup with GI for iron deficiency anemia workup since she has had hysterectomy. Will also need to follow with GI for nonspecific CT scan findings.     CT abdomen pelvis finding of right-sided hydrosalpinx, nonspecific abdominal adenopathy.  Patient and her daughter counseled, she has had chronic abdominal pain with no change in that pattern, have told her to follow outpatient with GI and her OB physician post discharge in a timely fashion.       Code Status: FULL Family Communication: no family present at time of exam Disposition Plan: Resolution CAP   Consultants: Dr Jennet Maduro (PCCM)  Procedure/Significant Events: Echocardiogram 12/10/2013 -Left ventricle: The cavity size was normal.  -LVEF=  55% to 60%.  CT abdomen pelvis with contrast 11/16/2013 -Progressive nodular opacities RML/RLL superimposed on a background of diffuse tree-in-bud micro nodularity concerning for progression of the  underlying infectious/ inflammatory process.  - Persistent Nonspecific retroperitoneal adenopathy (lymphoma vs metastatic disease vs Reactive or granulomatous nodes - Stable probable hydrosalpinx in the region of the right adnexa.  -. Stable peritoneal implants versus endometrial implants in the cul-de-sac of [place].    CT angiogram chest PE protocol 12/09/2013  -Negative  pulmonary embolus. - Airspace disease throughout the right lung/left lower lung consistent with multifocal pneumonia.  CXR 12/10/2013 -Residual streaky infiltrate in right perihilar region/right upper lobe with improvement in aeration. -Linear atelectasis or scarring in the lingula. .    Culture -4/22 blood culture right forearm negative -4/22 MRSA by PCR negative   Antibiotics:  Anti-infectives   Start     Dose/Rate Route Frequency Ordered Stop   12/10/13 1500  aztreonam (AZACTAM) 1 g in dextrose 5 % 50 mL IVPB     1 g 100 mL/hr over 30 Minutes Intravenous 3 times per day 12/10/13 1358     12/10/13 1500  vancomycin (VANCOCIN) IVPB 1000 mg/200 mL premix     1,000 mg 200 mL/hr over 60 Minutes Intravenous Every 8 hours 12/10/13 1358     12/09/13 1200  cefTRIAXone (ROCEPHIN) 1 g in dextrose 5 % 50 mL IVPB  Status:  Discontinued     1 g 100 mL/hr over 30 Minutes Intravenous Every 24 hours 12/09/13 1015 12/10/13 1337   12/09/13 1100  azithromycin (ZITHROMAX) 500 mg in dextrose 5 % 250 mL IVPB  Status:  Discontinued     500 mg 250 mL/hr over 60 Minutes Intravenous Every 24 hours 12/09/13 1015 12/10/13 1337   12/09/13 0641  ceFEPIme (MAXIPIME) 1 G injection    Comments:  Conchita Paris   : cabinet override      12/09/13 0641 12/09/13 0650   12/09/13 0630  vancomycin (VANCOCIN) 2,000 mg in sodium chloride 0.9 % 500 mL IVPB     2,000 mg 250 mL/hr over 120 Minutes Intravenous  Once 12/09/13 0621 12/09/13 0944   12/09/13 0630  ceFEPIme (MAXIPIME) 1 g in dextrose 5 % 50 mL IVPB  Status:  Discontinued     1 g 100 mL/hr  over 30 Minutes Intravenous 3 times per day 12/09/13 0621 12/09/13 1015      DVT prophylaxis: Lovenox daily   Devices   LINES / TUBES:  4/22 22 ga right forearm    Continuous Infusions:    Objective: VITAL SIGNS: Temp: 98.4 F (36.9 C) (04/24 0825) Temp src: Oral (04/24 0825) BP: 110/69 mmHg (04/24 0825) Pulse Rate: 76 (04/24 0825) SPO2; 95% on 3 L O2 FIO2:   Intake/Output Summary (Last 24 hours) at 12/11/13 1114 Last data filed at 12/11/13 1012  Gross per 24 hour  Intake    730 ml  Output   3000 ml  Net  -2270 ml     Exam: General: A./O. x4, mild discomfort secondary to chronic pain, SOB Lungs: Diffuse inspiratory/expiratory wheezing, decreased bibasilar breath sounds bilateral  Cardiovascular: Tachycardic, Regular rhythm without murmur gallop or rub normal S1 and S2 Renalbalance today;        /overall;        Creatinine ;     0.79   Hourly output   Abdomen: Nontender, nondistended, soft, bowel sounds positive, no rebound, no ascites, no appreciable mass Extremities: No significant cyanosis, clubbing, or edema bilateral lower extremities  Data Reviewed:  Basic Metabolic Panel:  Recent Labs Lab 12/09/13 0520 12/09/13 1330 12/10/13 0303 12/11/13 0445  NA 142  --  140 144  K 4.0  --  4.5 4.0  CL 101  --  105 106  CO2 28  --  23 25  GLUCOSE 102*  --  151* 122*  BUN 8  --  11 11  CREATININE 1.00 0.97 0.79 0.76  CALCIUM 9.8  --  9.3 9.0   Liver Function Tests:  Recent Labs Lab 12/09/13 0520 12/11/13 0445  AST 14 8  ALT 9 6  ALKPHOS 76 48  BILITOT 0.7 0.2*  PROT 8.1 6.7  ALBUMIN 4.1 3.0*    Recent Labs Lab 12/09/13 0520  LIPASE 31   No results found for this basename: AMMONIA,  in the last 168 hours CBC:  Recent Labs Lab 12/09/13 0410 12/09/13 1330 12/10/13 0303 12/11/13 0445  WBC 12.0* 20.2* 14.3* 10.1  NEUTROABS 10.2*  --   --  9.5*  HGB 11.6* 10.5* 9.3* 8.9*  HCT 38.3 34.8* 31.4* 30.1*  MCV 80.8 80.0 80.1 80.5  PLT 277 320  283 263   Cardiac Enzymes:  Recent Labs Lab 12/09/13 1340  TROPONINI <0.30   BNP (last 3 results)  Recent Labs  12/09/13 0520 12/10/13 1430  PROBNP 67.9 597.2*   CBG: No results found for this basename: GLUCAP,  in the last 168 hours  Recent Results (from the past 240 hour(s))  CULTURE, BLOOD (ROUTINE X 2)     Status: None   Collection Time    12/09/13  5:20 AM      Result Value Ref Range Status   Specimen Description BLOOD RIGHT ARM   Final   Special Requests BOTTLES DRAWN AEROBIC AND ANAEROBIC 10CC EACH   Final   Culture  Setup Time     Final   Value: 12/09/2013 08:26     Performed at [facility]   Culture     Final   Value:        BLOOD CULTURE RECEIVED NO GROWTH TO DATE CULTURE WILL BE HELD FOR 5 DAYS BEFORE ISSUING A FINAL NEGATIVE REPORT     Performed at [facility]   Report Status PENDING   Incomplete  MRSA PCR SCREENING     Status: None   Collection Time    12/09/13  9:17 AM      Result Value Ref Range Status   MRSA by PCR NEGATIVE  NEGATIVE Final   Comment:            The GeneXpert MRSA Assay (FDA     approved for NASAL specimens     only), is one component of a     comprehensive MRSA colonization     surveillance program. It is not     intended to diagnose MRSA     infection nor to guide or     monitor treatment for     MRSA infections.     Studies:  Recent x-ray studies have been reviewed in detail by the Attending Physician  Scheduled Meds:  Scheduled Meds: . aztreonam  1 g Intravenous 3 times per day  . buPROPion  300 mg Oral q morning - 10a  . citalopram  40 mg Oral q morning - 10a  . dextromethorphan-guaiFENesin  1 tablet Oral BID  . docusate sodium  100 mg Oral BID  . enoxaparin (LOVENOX) injection  40 mg Subcutaneous Q24H  . ipratropium-albuterol  3 mL Nebulization Q4H  .  levothyroxine  150 mcg Oral QAC breakfast  . methylPREDNISolone (SOLU-MEDROL) injection  60 mg Intravenous Q6H  . mometasone-formoterol  2 puff  Inhalation BID  . montelukast  10 mg Oral QHS  . morphine  15 mg Oral Q12H  . sodium chloride  3 mL Intravenous Q12H  . vancomycin  1,000 mg Intravenous Q8H    Time spent on care of this patient: 35 mins    LOS: 2 days     Thurnell Lose M.D on 12/11/2013 at 11:14 AM  Between 7am to 7pm - Pager - [phone], After 7pm go to [email or web address] - password [email or web address]  And look for the night coverage person covering me after hours  [facility]  Office  [phone]

## 2013-12-12 LAB — CBC
HCT: 32.6 % — ABNORMAL LOW (ref 36.0–46.0)
HEMOGLOBIN: 9.6 g/dL — AB (ref 12.0–15.0)
MCH: 23.7 pg — ABNORMAL LOW (ref 26.0–34.0)
MCHC: 29.4 g/dL — AB (ref 30.0–36.0)
MCV: 80.5 fL (ref 78.0–100.0)
PLATELETS: 241 10*3/uL (ref 150–400)
RBC: 4.05 MIL/uL (ref 3.87–5.11)
RDW: 16.8 % — ABNORMAL HIGH (ref 11.5–15.5)
WBC: 6.1 10*3/uL (ref 4.0–10.5)

## 2013-12-12 MED ORDER — FERROUS SULFATE 325 (65 FE) MG PO TABS: 325.0000 mg | ORAL_TABLET | Freq: Three times a day (TID) | ORAL | Status: DC

## 2013-12-12 MED ORDER — PREDNISONE 5 MG PO TABS: 50.0000 mg | ORAL_TABLET | Freq: Every day | ORAL | Status: DC

## 2013-12-12 MED ORDER — IPRATROPIUM-ALBUTEROL 0.5-2.5 (3) MG/3ML IN SOLN: 3.0000 mL | Freq: Four times a day (QID) | RESPIRATORY_TRACT | Status: DC | PRN

## 2013-12-12 MED ORDER — TIOTROPIUM BROMIDE MONOHYDRATE 18 MCG IN CAPS: 18.0000 ug | ORAL_CAPSULE | Freq: Every day | RESPIRATORY_TRACT | Status: DC

## 2013-12-12 MED ORDER — PROMETHAZINE HCL 25 MG PO TABS: 25.0000 mg | ORAL_TABLET | Freq: Four times a day (QID) | ORAL | Status: DC | PRN

## 2013-12-12 MED ORDER — DOXYCYCLINE HYCLATE 100 MG PO CAPS: 100.0000 mg | ORAL_CAPSULE | Freq: Two times a day (BID) | ORAL | Status: DC

## 2013-12-12 NOTE — Discharge Instructions (Signed)
Follow with Primary MD Guadlupe Spanish, MD in 4 days   Get CBC, CMP, checked 4 days by Primary MD and again as instructed by your Primary MD. Get a 2 view Chest X ray done next visit.   Activity: As tolerated with Full fall precautions use walker/cane & assistance as needed   Disposition Home    Diet: Heart Healthy   For Heart failure patients - Check your Weight same time everyday, if you gain over 2 pounds, or you develop in leg swelling, experience more shortness of breath or chest pain, call your Primary MD immediately. Follow Cardiac Low Salt Diet and 1.8 lit/day fluid restriction.   On your next visit with her primary care physician please Get Medicines reviewed and adjusted.  Please request your Prim.MD to go over all Hospital Tests and Procedure/Radiological results at the follow up, please get all Hospital records sent to your Prim MD by signing hospital release before you go home.   If you experience worsening of your admission symptoms, develop shortness of breath, life threatening emergency, suicidal or homicidal thoughts you must seek medical attention immediately by calling 911 or calling your MD immediately  if symptoms less severe.  You Must read complete instructions/literature along with all the possible adverse reactions/side effects for all the Medicines you take and that have been prescribed to you. Take any new Medicines after you have completely understood and accpet all the possible adverse reactions/side effects.   Do not drive and provide baby sitting services if your were admitted for syncope or siezures until you have seen by Primary MD or a Neurologist and advised to do so again.  Do not drive when taking Pain medications.    Do not take more than prescribed Pain, Sleep and Anxiety Medications  Special Instructions: If you have smoked or chewed Tobacco  in the last 2 yrs please stop smoking, stop any regular Alcohol  and or any Recreational drug  use.  Wear Seat belts while driving.   Please note  You were cared for by a hospitalist during your hospital stay. If you have any questions about your discharge medications or the care you received while you were in the hospital after you are discharged, you can call the unit and asked to speak with the hospitalist on call if the hospitalist that took care of you is not available. Once you are discharged, your primary care physician will handle any further medical issues. Please note that NO REFILLS for any discharge medications will be authorized once you are discharged, as it is imperative that you return to your primary care physician (or establish a relationship with a primary care physician if you do not have one) for your aftercare needs so that they can reassess your need for medications and monitor your lab values.

## 2013-12-12 NOTE — Progress Notes (Signed)
1248 12/12/13 nsg Pt discharge to home understood discharge instructions.

## 2013-12-12 NOTE — Discharge Summary (Signed)
Debra [name], is a 43 y.o. female  DOB [birth year]  MRN [id number].  Admission date:  12/09/2013  Admitting Physician  Kelvin Cellar, MD  Discharge Date:  12/12/2013   Primary MD  Guadlupe Spanish, MD  Recommendations for primary care physician for things to follow:   Repeat CBC, BMP and a 2 view chest x-ray in a week.  Monitor Iron levels closely   Will need to follow with GI and pulmonary along with her OB physician within 2 weeks     Admission Diagnosis  Hypoxemia [799.02] Healthcare-associated pneumonia [486] COPD exacerbation [491.21]   Discharge Diagnosis  Hypoxemia [799.02] Healthcare-associated pneumonia [486] COPD exacerbation [491.21]     Principal Problem:   CAP (community acquired pneumonia) Active Problems:   HCAP (healthcare-associated pneumonia)   Acute respiratory failure with hypoxia   Anxiety   Depression   Leukocytosis   Hypothyroidism   COPD exacerbation   Anxiety state, unspecified   Anemia   Recurrent pneumonia      Past Medical History  Diagnosis Date  . Asthma   . Ulcerative colitis   . Anxiety   . Depression   . Chronic back pain   . Chronic knee pain   . Thyroid disease     hypo  . GERD (gastroesophageal reflux disease)   . Insomnia   . Allergic rhinitis   . Arthritis   . COPD (chronic obstructive pulmonary disease)     Past Surgical History  Procedure Laterality Date  . Knee surgery      x3, two on left and one on right.  Arthoscopy  . Cholecystectomy    . Abdominal hysterectomy    . Cesarean section    . Tonsillectomy    . Joint replacement      2x on left, 1x on right     Discharge Condition: stable   Follow UP  Follow-up Information   Follow up with Guadlupe Spanish, MD. Schedule an appointment as soon as possible for a visit in 4 days. (and your OB  MD - you must faollow within 7 days)    Specialty:  Internal Medicine   Contact information:   [address] [place] [address] [phone]       Follow up with [facility], MD. Schedule an appointment as soon as possible for a visit in 1 week.   Specialty:  Pulmonary Disease   Contact information:   [place] [address] [phone]       Follow up with Silvano Rusk, MD. Schedule an appointment as soon as possible for a visit in 1 week. (non specific CT findings)    Specialty:  Gastroenterology   Contact information:   [place] [place] [address] [phone]         Discharge Instructions  and  Discharge Medications      Discharge Orders   Future Appointments Provider Department Dept Phone   12/21/2013 12:45 PM Lavonia Drafts, [facility] [phone]   12/28/2013  11:00 AM Melvenia Needles, NP [facility] [phone]   Future Orders Complete By Expires   Diet - low sodium heart healthy  As directed    Discharge instructions  As directed    Scheduling Instructions:   Follow with Primary MD Guadlupe Spanish, MD in 4 days   Get CBC, CMP, checked 4 days by Primary MD and again as instructed by your Primary MD. Get a 2 view Chest X ray done next visit.   Activity: As tolerated with Full fall precautions use walker/cane & assistance as needed   Disposition Home    Diet: Heart Healthy   For Heart failure patients - Check your Weight same time everyday, if you gain over 2 pounds, or you develop in leg swelling, experience more shortness of breath or chest pain, call your Primary MD immediately. Follow Cardiac Low Salt Diet and 1.8 lit/day fluid restriction.   On your next visit with her primary care physician please Get Medicines reviewed and adjusted.  Please request your Prim.MD to go over all Hospital Tests and Procedure/Radiological results at the follow up, please get all Hospital records sent to  your Prim MD by signing hospital release before you go home.   If you experience worsening of your admission symptoms, develop shortness of breath, life threatening emergency, suicidal or homicidal thoughts you must seek medical attention immediately by calling 911 or calling your MD immediately  if symptoms less severe.  You Must read complete instructions/literature along with all the possible adverse reactions/side effects for all the Medicines you take and that have been prescribed to you. Take any new Medicines after you have completely understood and accpet all the possible adverse reactions/side effects.   Do not drive and provide baby sitting services if your were admitted for syncope or siezures until you have seen by Primary MD or a Neurologist and advised to do so again.  Do not drive when taking Pain medications.    Do not take more than prescribed Pain, Sleep and Anxiety Medications  Special Instructions: If you have smoked or chewed Tobacco  in the last 2 yrs please stop smoking, stop any regular Alcohol  and or any Recreational drug use.  Wear Seat belts while driving.   Please note  You were cared for by a hospitalist during your hospital stay. If you have any questions about your discharge medications or the care you received while you were in the hospital after you are discharged, you can call the unit and asked to speak with the hospitalist on call if the hospitalist that took care of you is not available. Once you are discharged, your primary care physician will handle any further medical issues. Please note that NO REFILLS for any discharge medications will be authorized once you are discharged, as it is imperative that you return to your primary care physician (or establish a relationship with a primary care physician if you do not have one) for your aftercare needs so that they can reassess your need for medications and monitor your lab values.   Increase activity slowly   As directed        Medication List         albuterol 108 (90 BASE) MCG/ACT inhaler  Commonly known as:  PROVENTIL HFA;VENTOLIN HFA  Inhale 2 puffs into the lungs every 6 (six) hours as needed for wheezing.     ALPRAZolam 1 MG tablet  Commonly known as:  XANAX  Take 0.5 mg by mouth  3 (three) times daily as needed for anxiety.     buPROPion 300 MG 24 hr tablet  Commonly known as:  WELLBUTRIN XL  Take 300 mg by mouth every morning.     cholecalciferol 1000 UNITS tablet  Commonly known as:  VITAMIN D  Take 1,000 Units by mouth every morning.     citalopram 40 MG tablet  Commonly known as:  CELEXA  Take 40 mg by mouth every morning.     doxycycline 100 MG capsule  Commonly known as:  VIBRAMYCIN  Take 1 capsule (100 mg total) by mouth 2 (two) times daily.     ferrous sulfate 325 (65 FE) MG tablet  Take 1 tablet (325 mg total) by mouth 3 (three) times daily with meals.     Fluticasone-Salmeterol 500-50 MCG/DOSE Aepb  Commonly known as:  ADVAIR  Inhale 1 puff into the lungs every 12 (twelve) hours.     furosemide 20 MG tablet  Commonly known as:  LASIX  Take 20 mg by mouth every morning.     ipratropium-albuterol 0.5-2.5 (3) MG/3ML Soln  Commonly known as:  DUONEB  Take 3 mLs by nebulization every 6 (six) hours as needed (for wheezing).     levothyroxine 150 MCG tablet  Commonly known as:  SYNTHROID, LEVOTHROID  Take 150 mcg by mouth daily before breakfast.     montelukast 10 MG tablet  Commonly known as:  SINGULAIR  Take 10 mg by mouth at bedtime.     morphine 15 MG tablet  Commonly known as:  MSIR  Take 15 mg by mouth daily as needed for severe pain.     morphine 30 MG 12 hr tablet  Commonly known as:  MS CONTIN  Take 1 tablet (30 mg total) by mouth 2 (two) times daily.     omeprazole 40 MG capsule  Commonly known as:  PRILOSEC  Take 40 mg by mouth every morning.     predniSONE 5 MG tablet  Commonly known as:  DELTASONE  Take 10 tablets (50 mg total) by  mouth daily with breakfast. Take 1 pill daily to use either pulmonary physician thereafter as per pulmonary physician     promethazine 25 MG tablet  Commonly known as:  PHENERGAN  Take 1 tablet (25 mg total) by mouth every 6 (six) hours as needed for nausea.     tiotropium 18 MCG inhalation capsule  Commonly known as:  SPIRIVA HANDIHALER  Place 1 capsule (18 mcg total) into inhaler and inhale daily.     traZODone 100 MG tablet  Commonly known as:  DESYREL  Take 100 mg by mouth at bedtime as needed for sleep.          Diet and Activity recommendation: See Discharge Instructions above   Consults obtained - PCCM   Major procedures and Radiology Reports - PLEASE review detailed and final reports for all details, in brief -       Dg Chest 2 View  12/10/2013   CLINICAL DATA:  Followup pneumonia  EXAM: CHEST  2 VIEW  COMPARISON:  12/09/2013  FINDINGS: Cardiomediastinal silhouette is stable. Improvement in aeration. Residual streaky infiltrate right perihilar and right upper lobe. Linear atelectasis or scarring in the lingula. No pulmonary edema.  IMPRESSION: Residual streaky infiltrate in right perihilar region and right upper lobe with improvement in aeration. Follow-up to complete resolution is recommended. Linear atelectasis or scarring in the lingula. No pulmonary edema.   Electronically Signed   By: Natasha [place]  M.D.   On: 12/10/2013 08:13   Ct Angio Chest Pe W/cm &/or Wo Cm  12/09/2013   CLINICAL DATA:  Shortness of breath, wheezing, abdominal pain, nausea, and vomiting.  EXAM: CT ANGIOGRAPHY CHEST WITH CONTRAST  TECHNIQUE: Multidetector CT imaging of the chest was performed using the standard protocol during bolus administration of intravenous contrast. Multiplanar CT image reconstructions and MIPs were obtained to evaluate the vascular anatomy.  CONTRAST:  6mL OMNIPAQUE IOHEXOL 300 MG/ML SOLN, 170mL OMNIPAQUE IOHEXOL 350 MG/ML SOLN  COMPARISON:  04/01/2013  FINDINGS: The contrast  bolus is somewhat limited but there is moderately good opacification of the central and segmental pulmonary arteries. No focal filling defects are identified. No evidence of significant pulmonary embolus. Mild prominence of lymph nodes in the right hilum and mediastinum without pathologic enlargement. These are likely reactive. The heart size is mildly increased. Normal caliber thoracic aorta. There is a small esophageal hiatal hernia. Residual contrast material throughout the esophagus suggest reflux or dysmotility. Patchy airspace disease throughout the right lung and in the left lung base most suggestive of multifocal pneumonia. Appearance is worse is seen in the previous study there is a 7 mm nodule in the right lung base posteriorly that is stable since the previous study. Chest x-rays obtained between the 2 studies suggest that the infiltrative changes are recurrent rather than persistent. No pleural effusions. Visualized portions of the upper abdominal organs are unremarkable.  Review of the MIP images confirms the above findings.  IMPRESSION: No evidence of significant pulmonary embolus. Airspace disease throughout the right lung and in the left lower lung consistent with multifocal pneumonia.   Electronically Signed   By: Lucienne Capers M.D.   On: 12/09/2013 06:03   Ct Abdomen Pelvis W Contrast  12/09/2013   CLINICAL DATA:  Shortness of breath, wheezing, abdominal pain, nausea, vomiting. History of ulcerative colitis.  EXAM: CT ABDOMEN AND PELVIS WITH CONTRAST  TECHNIQUE: Multidetector CT imaging of the abdomen and pelvis was performed using the standard protocol following bolus administration of intravenous contrast.  CONTRAST:  53mL OMNIPAQUE IOHEXOL 300 MG/ML SOLN, 146mL OMNIPAQUE IOHEXOL 350 MG/ML SOLN  COMPARISON:  11/16/2013  FINDINGS: Consolidation in the right lung base as demonstrated on CT chest obtained at the same time. Small esophageal hiatal hernia.  Surgical absence of the gallbladder.  The liver, spleen, pancreas, adrenal glands, kidneys, abdominal aorta, inferior vena cava, visualize small bowel, and colon are unremarkable. There is mild prominence of retroperitoneal lymph nodes with a a aorta caval lymph node measuring up to about 15 mm diameter. This is stable since previous study. No free air or free fluid in the abdomen.  Pelvis: Fluid collections in the pelvis are similar to previous study, previously indicated as hydrosalpinx. Uterus appears to be surgically absent. Ovaries are not enlarged. Appendix is normal. No evidence of diverticulitis. Bladder wall is not thickened. No destructive bone lesions.  IMPRESSION: No significant changes since previous study in the abdomen or pelvis. Progression of consolidative changes in the right lung base probably representing recurrent pneumonia.   Electronically Signed   By: Lucienne Capers M.D.   On: 12/09/2013 06:12   Ct Abdomen Pelvis W Contrast  11/16/2013   CLINICAL DATA:  Abdominal pain, nausea, vomiting, weakness  EXAM: CT ABDOMEN AND PELVIS WITH CONTRAST  TECHNIQUE: Multidetector CT imaging of the abdomen and pelvis was performed using the standard protocol following bolus administration of intravenous contrast.  CONTRAST:  58mL OMNIPAQUE IOHEXOL 300 MG/ML SOLN, 158mL OMNIPAQUE IOHEXOL  300 MG/ML SOLN  COMPARISON:  Prior CT abdomen/ pelvis 10/26/2013  FINDINGS: Lower Chest: Persistent diffuse peribronchovascular distribution of tree-in-bud micro nodularity throughout the visualized right middle and right lower lobe. Interval development of a 1.4 cm nodular opacity in the posteromedial aspect of the right lower lobe abutting the pleura compared to the recent prior study. Additionally, there are some focal opacities in the anterior right middle lobe an within the major fissure which are new compared to prior. Stable 9 mm nodule in the periphery of the right lower lobe (image 18 series 4). The left lung is relatively clear. Visualized cardiac  structures are within normal limits for size. Small hiatal hernia with mild eccentric wall thickening is similar compared to prior studies dating back to August of 2014 and likely represents prominence of the rugal folds.  Abdomen: Unremarkable CT appearance of the intra-abdominal stomach, duodenum, spleen, adrenal glands and pancreas. Normal hepatic morphology and contours. No discrete hepatic lesion. The gallbladder is surgically absent. No intra or extrahepatic biliary ductal dilatation.  Unremarkable appearance of the bilateral kidneys. No focal solid lesion, hydronephrosis or nephrolithiasis.  No evidence of obstruction or focal bowel wall thickening. Normal appendix in the right lower quadrant. The terminal ileum is unremarkable. The appearance of a masslike density in the cecum was not evident on the recent prior CT scan from 10/26/2013 where oral contrast material better opacified cecum. This almost certainly represents a stool ball on the present study given the short interval. Stable right para-aortic lymph node measures 1.5 cm in short axis on image 35 of series 2.  Pelvis: Unchanged tubular fluid-filled structure in the region of the right adnexa consistent with hydrosalpinx. This is similar compared to prior. The uterus is surgically absent. Nonspecific lower retroperitoneal adenopathy in the bilateral common iliac stations. Similar to the recent prior imaging, a right external iliac node measures approximately 1.3 cm in short axis, a node in the midline just be low the aortic bifurcation measures 1.3 cm in short axis and a left external iliac station node measures 1.4 cm in short axis. The external iliac station nodes are essentially unremarkable. Stable soft tissue attenuation peritoneal nodules in the recto vesicular recess of Douglas. A 7 mm implants noted on image 75 of series 2, and 7 mm implant on image 74 of series 2.  Bones/Soft Tissues: No acute fracture or aggressive appearing lytic or blastic  osseous lesion.  Vascular: No significant atherosclerotic vascular disease, aneurysmal dilatation or acute abnormality.  IMPRESSION: 1. Progressive nodular opacities in the right middle and right lower lobe superimposed on a background of diffuse tree-in-bud micro nodularity concerning for progression of the underlying infectious/ inflammatory process. 2. Persistent retroperitoneal adenopathy remains nonspecific but concerning. Primary differential considerations include a lymphoproliferative process such as lymphoma and metastatic disease. Reactive or granulomatous nodes are considered less likely. Consider further evaluation with PET-CT to evaluate for increased metabolic activity. 3. 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 4. Stable peritoneal implants versus endometrial implants in the cul-de-sac of Douglas. 5. Additional ancillary findings as above without significant interval change.   Electronically Signed   By: Jacqulynn Cadet M.D.   On: 11/16/2013 22:32   Dg Chest Port 1 View  12/09/2013   CLINICAL DATA:  Shortness of breath, wheezing, abdominal pain, nausea, vomiting age.  EXAM: PORTABLE CHEST - 1 VIEW  COMPARISON:  DG CHEST 1V PORT dated 09/20/2013  FINDINGS: Shallow inspiration. Mild cardiac enlargement and pulmonary vascular congestion. Patchy nodular airspace  disease in the upper lungs, predominantly on the right. Changes likely represent pulmonary edema although multifocal pneumonia could also have this appearance. No blunting of costophrenic angles.  IMPRESSION: Cardiac enlargement with mild pulmonary vascular congestion. Patchy airspace disease in the upper lungs, greater on the right.   Electronically Signed   By: Lucienne Capers M.D.   On: 12/09/2013 04:43    Micro Results      Recent Results (from the past 240 hour(s))  CULTURE, BLOOD (ROUTINE X 2)     Status: None   Collection Time    12/09/13  5:20 AM      Result Value Ref Range Status   Specimen  Description BLOOD RIGHT ARM   Final   Special Requests BOTTLES DRAWN AEROBIC AND ANAEROBIC 10CC EACH   Final   Culture  Setup Time     Final   Value: 12/09/2013 08:26     Performed at [facility]   Culture     Final   Value:        BLOOD CULTURE RECEIVED NO GROWTH TO DATE CULTURE WILL BE HELD FOR 5 DAYS BEFORE ISSUING A FINAL NEGATIVE REPORT     Performed at [facility]   Report Status PENDING   Incomplete  MRSA PCR SCREENING     Status: None   Collection Time    12/09/13  9:17 AM      Result Value Ref Range Status   MRSA by PCR NEGATIVE  NEGATIVE Final   Comment:            The GeneXpert MRSA Assay (FDA     approved for NASAL specimens     only), is one component of a     comprehensive MRSA colonization     surveillance program. It is not     intended to diagnose MRSA     infection nor to guide or     monitor treatment for     MRSA infections.  CULTURE, EXPECTORATED SPUTUM-ASSESSMENT     Status: None   Collection Time    12/11/13  9:00 AM      Result Value Ref Range Status   Specimen Description SPUTUM   Final   Special Requests Normal   Final   Sputum evaluation     Final   Value: THIS SPECIMEN IS ACCEPTABLE. RESPIRATORY CULTURE REPORT TO FOLLOW.   Report Status 12/11/2013 FINAL   Final     History of present illness and  Hospital Course:     Kindly see H&P for history of anxiety chronic pain syndrome secondary to being run over by tractor (required multiple surgeries), GERD, chronic obstructive pulmonary disease, Hx Recurrent PNA since Oct 2014 (treated with multiple rounds of antibiotics without full resolution), ulcerative colitis, uses supplemental oxygen at bedtime, presents as a transfer from [place]. She reported progressively worsening cough, wheezing, shortness of breath over the past week. Symptoms have worsened despite taking her home breathing treatments and using supplemental oxygen. Cough is associated with yellow to brown sputum  production, and is nonbloody.    She also complained of retrosternal chest pain characterized as tight, worsened by deep inspiration and cough. Patient also complains of nausea and vomiting, abdominal pain, generalized weakness, malaise and fatigue. She was found to have an O2 sat of 88% with respiratory distress at [facility]. She had an ER visit on 11/16/2013 at which time she was prescribed azithromycin. CT scan of the chest performed  at [facility] showing consolidative changes in the right lung base which likely represent recurrent pneumonia.     HCAP versus persistent pneumonitis versus [place] causing acute chronic hypoxic respiratory failure   Swallow study, improved considerably with IV steroids and empiric IV antibiotics for HCAP. Was seen by pulmonary. Now feels much better and is off oxygen and symptom-free today, will place her on 7 more days of oral doxycycline along with 50 mg of prednisone daily. She will follow with pulmonary in a week steroid dose thereafter per pulmonary. She will be provided with nebulizer treatments as per the vertigo home.    COPD with mild exacerbation.  Now stable treatment as in #1 above. No oxygen needed now.    Chronic pain syndrome   Is counseled on overuse of pain and anxiety medications, she will commence her home medications unchanged. Have asked her to gradually taper off narcotics and benzos if she can.   Depression and anxiety  No Acute issues not suicidal homicidal, resume Celexa and Wellbutrin per home dose. Home benzodiazepine resumed.     Anemia  -Most likely iron deficiency, IV iron her and we'll place her on oral iron supplementation. Will need outpatient followup with GI for iron deficiency anemia workup since she has had hysterectomy. Will also need to follow with GI for nonspecific CT scan findings.     CT abdomen pelvis finding of right-sided hydrosalpinx, nonspecific abdominal adenopathy.  Patient  and her daughter counseled, she has had chronic abdominal pain with no change in that pattern, have told her to follow outpatient with GI and her OB physician post discharge in a timely fashion.      Today   Subjective:   Ansleigh Safer today has no headache,no chest abdominal pain,no new weakness tingling or numbness, feels much better wants to go home today.   Objective:   Blood pressure 123/78, pulse 52, temperature 98.6 F (37 C), temperature source Oral, resp. rate 18, height 5\' 8"  (1.727 m), weight 97.1 kg (214 lb 1.1 oz), SpO2 97.00%.   Intake/Output Summary (Last 24 hours) at 12/12/13 1000 Last data filed at 12/12/13 0602  Gross per 24 hour  Intake   1890 ml  Output   1200 ml  Net    690 ml    Exam Awake Alert, Oriented *3, No new F.N deficits, Normal affect [place].AT,PERRAL Supple Neck,No JVD, No cervical lymphadenopathy appriciated.  Symmetrical Chest wall movement, Good air movement bilaterally, CTAB RRR,No Gallops,Rubs or new Murmurs, No Parasternal Heave +ve B.Sounds, Abd Soft, Non tender, No organomegaly appriciated, No rebound -guarding or rigidity. No Cyanosis, Clubbing or edema, No new Rash or bruise  Data Review   CBC w Diff: Lab Results  Component Value Date   WBC 6.1 12/12/2013   HGB 9.6* 12/12/2013   HCT 32.6* 12/12/2013   PLT 241 12/12/2013   LYMPHOPCT 5* 12/11/2013   MONOPCT 1* 12/11/2013   EOSPCT 0 12/11/2013   BASOPCT 0 12/11/2013    CMP: Lab Results  Component Value Date   NA 144 12/11/2013   K 4.0 12/11/2013   CL 106 12/11/2013   CO2 25 12/11/2013   BUN 11 12/11/2013   CREATININE 0.76 12/11/2013   PROT 6.7 12/11/2013   ALBUMIN 3.0* 12/11/2013   BILITOT 0.2* 12/11/2013   ALKPHOS 48 12/11/2013   AST 8 12/11/2013   ALT 6 12/11/2013  .   Total Time in preparing paper work, data evaluation and todays exam - 35 minutes  Abbagale Goguen  Jennette Kettle M.D on 12/12/2013 at 10:00 AM  [facility] Office  [phone]

## 2013-12-12 NOTE — Progress Notes (Signed)
Subjective: Feels well, with no complaints.  Wants to go home.  Objective: Vital signs in last 24 hours: Blood pressure 123/78, pulse 52, temperature 98.6 F (37 C), temperature source Oral, resp. rate 18, height 5\' 8"  (1.727 m), weight 214 lb 1.1 oz (97.1 kg), SpO2 97.00%.  Intake/Output from previous day: 04/24 0701 - 04/25 0700 In: 1890 [P.O.:1240; IV Piggyback:650] Out: 1200 [Urine:1200]   Physical Exam:   obese female in nad Nose without purulence or d/c noted. Neck without TMG or LN Chest totally clear to auscultation. Cor with rrr abd soft, nt, bs+ LE without edema, no cyanosis Alert and oriented, moves all 4.    Lab Results:  Recent Labs  12/10/13 0303 12/11/13 0445 12/12/13 0920  WBC 14.3* 10.1 6.1  HGB 9.3* 8.9* 9.6*  HCT 31.4* 30.1* 32.6*  PLT 283 263 241   BMET  Recent Labs  12/10/13 0303 12/11/13 0445  NA 140 144  K 4.5 4.0  CL 105 106  CO2 23 25  GLUCOSE 151* 122*  BUN 11 11  CREATININE 0.79 0.76  CALCIUM 9.3 9.0    Studies/Results: No results found.  Assessment/Plan:  1) H/o recurrent PNA Pt much improved this am, and afebrile.  W/u in progress for more inflammatory conditions in the event this does not represent pna.  -ok for d/c with abx and prednisone -f/u with Dr. Harland German, M.D. 12/12/2013, 2:16 PM

## 2013-12-12 NOTE — Progress Notes (Signed)
CARE MANAGEMENT NOTE 12/12/2013  Patient:  [name],[name]   Account Number:  [id number]  Date Initiated:  12/09/2013  Documentation initiated by:  Elissa Hefty  Subjective/Objective Assessment:   adm w pneumonia     Action/Plan:   lives w mother pcp dr Debra [name], uses o2 at nite at home per chart   Anticipated DC Date:     Anticipated DC Plan:    In-house referral  Financial Counselor      DC Planning Services  CM consult  [place] [facility]      Choice offered to / List presented to:             Status of service:  Completed, signed off Medicare Important Message given?   (If response is "NO", the following Medicare IM given date fields will be blank) Date Medicare IM given:   Date Additional Medicare IM given:    Discharge Disposition:  HOME/SELF CARE  Per UR Regulation:  Reviewed for med. necessity/level of care/duration of stay  If discussed at [place] of Stay Meetings, dates discussed:    Comments:  12/12/13 12:15 CM gave pt Debra [name] letter with list of participating pharmacies.  Pt verbalized understanding of MATCH parameters however, she was MATCHED in October 2014 and still has no insurance.  Pt states she has a PCP, Avrind, MD with [facility].  Pt verbalizes the importance on following up on her disability determination. Pt given handout on [employer] [place] helpline to have a navigator help her in pursuing insurance/disability.  No other CM needs were communicated.  Debra [name], BSN, [place] [phone].  4/23 1554 Debra dowell rn,bsn spoke w pt. no ins but does have pcp dr Debra [name]. her mother assists w med cost. pt's md prescribes as many genric meds as possible and pt states has as low as possible. gave her [employer] and prescription discount card. will alert [employer]. pt has applied for soc sec disability and is in appeal process.

## 2013-12-13 ENCOUNTER — Telehealth (HOSPITAL_BASED_OUTPATIENT_CLINIC_OR_DEPARTMENT_OTHER): Payer: Self-pay

## 2013-12-13 LAB — CULTURE, RESPIRATORY: Gram Stain: NONE SEEN

## 2013-12-13 LAB — CULTURE, RESPIRATORY W GRAM STAIN: Culture: NORMAL

## 2013-12-13 NOTE — Telephone Encounter (Signed)
[facility] calling w/critical lab value.  Pt dcd from hospital.  Randell Loop provided pts PCP name Dr Charna Archer and contact info for him.

## 2013-12-14 LAB — CULTURE, BLOOD (ROUTINE X 2)

## 2013-12-14 LAB — ANCA SCREEN W REFLEX TITER
ATYPICAL P-ANCA SCREEN: NEGATIVE
P-ANCA SCREEN: POSITIVE — AB
c-ANCA Screen: NEGATIVE

## 2013-12-14 LAB — MPO/PR-3 (ANCA) ANTIBODIES

## 2013-12-14 LAB — ANTI-NUCLEAR AB-TITER (ANA TITER)

## 2013-12-14 LAB — ANCA TITERS: P-ANCA: 1:20 {titer} — ABNORMAL HIGH

## 2013-12-14 LAB — GLOMERULAR BASEMENT MEMBRANE ANTIBODIES: GBM Ab: <1

## 2013-12-14 LAB — ANTI-DNA ANTIBODY, DOUBLE-STRANDED: ds DNA Ab: 1 IU/mL

## 2013-12-14 LAB — SJOGRENS SYNDROME-B EXTRACTABLE NUCLEAR ANTIBODY: SSB (LA) (ENA) ANTIBODY, IGG: NEGATIVE

## 2013-12-14 LAB — ANTI-SCLERODERMA ANTIBODY: Scleroderma (Scl-70) (ENA) Antibody, IgG: <1

## 2013-12-14 LAB — CYCLIC CITRUL PEPTIDE ANTIBODY, IGG: Cyclic Citrullin Peptide Ab: <2 U/mL (ref 0.0–5.0)

## 2013-12-14 LAB — SJOGRENS SYNDROME-A EXTRACTABLE NUCLEAR ANTIBODY: SSA (RO) (ENA) ANTIBODY, IGG: NEGATIVE

## 2013-12-14 LAB — ANA: Anti Nuclear Antibody(ANA): POSITIVE — AB

## 2013-12-15 LAB — ALDOLASE: Aldolase: 7.7 U/L (ref <=8.1)

## 2013-12-19 LAB — HYPERSENSITIVITY PNUEMONITIS PROFILE

## 2013-12-21 ENCOUNTER — Encounter: Payer: Self-pay | Admitting: Obstetrics & Gynecology

## 2013-12-28 ENCOUNTER — Ambulatory Visit (INDEPENDENT_AMBULATORY_CARE_PROVIDER_SITE_OTHER): Payer: Self-pay | Admitting: Adult Health

## 2013-12-28 ENCOUNTER — Encounter: Payer: Self-pay | Admitting: Adult Health

## 2013-12-28 ENCOUNTER — Ambulatory Visit (INDEPENDENT_AMBULATORY_CARE_PROVIDER_SITE_OTHER)
Admission: RE | Admit: 2013-12-28 | Discharge: 2013-12-28 | Disposition: A | Discharge status: Home / Self Care | Payer: Self-pay | Source: Ambulatory Visit | Attending: Adult Health | Admitting: Adult Health

## 2013-12-28 VITALS — BP 132/84 | HR 76 | Temp 98.4°F | Ht 62.0 in | Wt 212.6 lb

## 2013-12-28 DIAGNOSIS — J189 Pneumonia, unspecified organism: Secondary | ICD-10-CM

## 2013-12-28 DIAGNOSIS — J449 Chronic obstructive pulmonary disease, unspecified: Secondary | ICD-10-CM

## 2013-12-28 MED ORDER — FLUTICASONE-SALMETEROL 250-50 MCG/DOSE IN AEPB: 1.0000 | INHALATION_SPRAY | Freq: Two times a day (BID) | RESPIRATORY_TRACT | Status: DC

## 2013-12-28 MED ORDER — PREDNISONE 10 MG PO TABS: ORAL_TABLET | ORAL | Status: DC

## 2013-12-28 MED ORDER — ALBUTEROL SULFATE HFA 108 (90 BASE) MCG/ACT IN AERS: 2.0000 | INHALATION_SPRAY | RESPIRATORY_TRACT | Status: DC | PRN

## 2013-12-28 NOTE — Patient Instructions (Addendum)
Decrease Advair 250/26mcg 2 puffs Twice daily  , rinse after use.  May stop Dulera .  Fill out patient assistance paperwork for Advair and ProAir .  PFT on return if not done recently .  Restart Prednisone 40mg  daily for 1 week then 30mg  daily for 1 week and 20mg  daily for 1 week and hold at 10mg  daily  Follow up Dr. Chase Caller   In 4 weeks and As needed. Please contact office for sooner follow up if symptoms do not improve or worsen or seek emergency care     Late add :  Refer to Rheumatology

## 2013-12-30 NOTE — Progress Notes (Signed)
Quick Note:  lmtcb ______ 

## 2014-01-01 ENCOUNTER — Telehealth: Payer: Self-pay | Admitting: Pulmonary Disease

## 2014-01-01 ENCOUNTER — Telehealth: Payer: Self-pay | Admitting: Adult Health

## 2014-01-01 DIAGNOSIS — R768 Other specified abnormal immunological findings in serum: Secondary | ICD-10-CM

## 2014-01-01 NOTE — Telephone Encounter (Signed)
Pt states she has left a voicemail all morning with [place]. Informed pt we do not have a sandy but someone will be calling her today.  [phone]

## 2014-01-01 NOTE — Progress Notes (Signed)
   Subjective:    Patient ID: Debra [name], female    DOB: 10/02/1970, 43 y.o.   MRN: [id number]  HPI  43 y/o F former smoker with PMH of anxiety / depression, GERD, hypothyroidism, arthritis, chronic back/knee pain, ulcerative colitis, COPD. Seen for pulmonary consult  12/09/13 during hospitalization for recurrent PNA.      12/28/13 [facility] follow up  Returns for post hospital follow up .  Admitted 4/22-25/2015 for COPD exacerbation , +/- HCAP vs BOOP .  Since discharge she is feeling better but still weak. Has some lingering DOE, wheezing, prod cough with occasional gree/gray mucus.   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 CT scan of the chest showed consolidative  changes in the right lung base. Underwent swallow eval that was neg. Was tx for possible BOOP vs pneumonitis vs HCAP  tx w/ IV abx , steroids.  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  Discharged on pred $Remov'50mg'siImYP$  daily and 7 d of doxycycline.  Unfortunately she did not receive the prednisone .  CXR today shows resolution of R sided aspdz .  Multiple ER/Hospitalization visits.  Taking dulera and advair samples  Needs rx assistance with cost.     Review of Systems Constitutional:   No  weight loss, night sweats,  Fevers, chills, + fatigue, or  lassitude.  HEENT:   No headaches,  Difficulty swallowing,  Tooth/dental problems, or  Sore throat,                No sneezing, itching, ear ache, +nasal congestion, post nasal drip,   CV:  No chest pain,  Orthopnea, PND, swelling in lower extremities, anasarca,  dizziness, palpitations, syncope.   GI  No heartburn, indigestion, abdominal pain, nausea, vomiting, diarrhea, change in bowel habits, loss of appetite, bloody stools.   Resp:    No chest wall deformity  Skin: no rash or lesions.  GU: no dysuria, change in color of urine, no urgency or frequency.  No flank pain, no hematuria   MS:  No joint pain or swelling.  No decreased range of motion.  No back pain.  Psych:  No change in mood or affect. No depression or anxiety.  No memory loss.         Objective:   Physical Exam GEN: A/Ox3; pleasant , NAD,   HEENT:  [place]/AT,  EACs-clear, TMs-wnl, NOSE-clear, THROAT-clear, no lesions, no postnasal drip or exudate noted.   NECK:  Supple w/ fair ROM; no JVD; normal carotid impulses w/o bruits; no thyromegaly or nodules palpated; no lymphadenopathy.  RESP  Clear  P & A; w/o, wheezes/ rales/ or rhonchi.no accessory muscle use, no dullness to percussion  CARD:  RRR, no m/r/g  , no peripheral edema, pulses intact, no cyanosis or clubbing.  GI:   Soft & nt; nml bowel sounds; no organomegaly or masses detected.  Musco: Warm bil, no deformities or joint swelling noted.   Neuro: alert, no focal deficits noted.    Skin: Warm, no lesions or rashes         Assessment & Plan:

## 2014-01-01 NOTE — Assessment & Plan Note (Addendum)
Decrease Advair 250/60mcg 2 puffs Twice daily  , rinse after use.  May stop Dulera .  Fill out patient assistance paperwork for Advair and ProAir .  PFT on return if not done recently .  Restart Prednisone 40mg  daily for 1 week then 30mg  daily for 1 week and 20mg  daily for 1 week and hold at 10mg  daily  Follow up Dr. Chase Caller   In 4 weeks and As needed. Please contact office for sooner follow up if symptoms do not improve or worsen or seek emergency care

## 2014-01-01 NOTE — Telephone Encounter (Signed)
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

## 2014-01-01 NOTE — Telephone Encounter (Signed)
Spoke with pt. Aware of results. Pt is aware will send referral to rheumatology. Nothing further needed

## 2014-01-01 NOTE — Assessment & Plan Note (Addendum)
HCAP recurrent vs BOOP vs pneumonitis  cxr w/ resolution of infiltrate on right  Autoimmune w/up positive for ANA, pANCA  In setting of hx of Ulcerative Colits -will refer to Rheumatology  Restart slow steroid taper as previous instructed    Plan  Restart Prednisone 40mg  daily for 1 week then 30mg  daily for 1 week and 20mg  daily for 1 week and hold at 10mg  daily  Refer to Rheumatology  Follow up Dr. Chase Caller  In 4 weeks and As needed. Please contact office for sooner follow up if symptoms do not improve or worsen or seek emergency care

## 2014-01-01 NOTE — Telephone Encounter (Signed)
Notes Recorded by Melvenia Needles, NP on 12/30/2013 at 12:29 PM Xray is much improved PNA resolved on xray  Please contact office for sooner follow up if symptoms do not improve or worsen or seek emergency care  Cont w/ ov recs ---  lmomtcb x1

## 2014-01-01 NOTE — Telephone Encounter (Signed)
I spoke with patient about results and she verbalized understanding and had no questions 

## 2014-01-06 ENCOUNTER — Telehealth: Payer: Self-pay | Admitting: Adult Health

## 2014-01-06 MED ORDER — ALPRAZOLAM 0.5 MG PO TABS: ORAL_TABLET | ORAL | Status: DC

## 2014-01-06 NOTE — Telephone Encounter (Signed)
Spoke with the pt  She states that she is on her third day of taking pred 30 mg and "having terrible, terrible, side effects" She states that she is having increased anxiety and panic attacks  Her breathing is worse, which she relates to anxiety  She states that this happens every time she takes pred  She states" I'm not trying to ask for no drugs, but my peach xanax is not enough" She takes the 0.5 mg alprazolam bid and "have been taking extra and now I am out" Pt tearful and rambling about not being able to tolerate med  TP not here so will forward to the doc of the day Please advise, thanks!

## 2014-01-06 NOTE — Telephone Encounter (Signed)
Ok to refill #40 up to qid prn Reduce prednisone to 20 x 2 10 x 2 and stop

## 2014-01-06 NOTE — Telephone Encounter (Signed)
Spoke with the pt and notified of recs per MW She verbalized understanding and nothing further needed  I called her rx to [facility]

## 2014-02-01 ENCOUNTER — Ambulatory Visit: Payer: Self-pay | Admitting: Pulmonary Disease

## 2014-02-01 ENCOUNTER — Ambulatory Visit: Payer: Self-pay | Admitting: Internal Medicine

## 2014-02-09 ENCOUNTER — Emergency Department (HOSPITAL_COMMUNITY): Payer: Self-pay

## 2014-02-09 ENCOUNTER — Emergency Department (HOSPITAL_COMMUNITY)
Admission: EM | Admit: 2014-02-09 | Discharge: 2014-02-09 | Disposition: A | Discharge status: Home / Self Care | Payer: Self-pay | Attending: Emergency Medicine | Admitting: Emergency Medicine

## 2014-02-09 DIAGNOSIS — Z87891 Personal history of nicotine dependence: Secondary | ICD-10-CM | POA: Insufficient documentation

## 2014-02-09 DIAGNOSIS — K219 Gastro-esophageal reflux disease without esophagitis: Secondary | ICD-10-CM | POA: Insufficient documentation

## 2014-02-09 DIAGNOSIS — J4489 Other specified chronic obstructive pulmonary disease: Secondary | ICD-10-CM | POA: Insufficient documentation

## 2014-02-09 DIAGNOSIS — M545 Low back pain, unspecified: Secondary | ICD-10-CM | POA: Insufficient documentation

## 2014-02-09 DIAGNOSIS — IMO0002 Reserved for concepts with insufficient information to code with codable children: Secondary | ICD-10-CM | POA: Insufficient documentation

## 2014-02-09 DIAGNOSIS — Z9889 Other specified postprocedural states: Secondary | ICD-10-CM | POA: Insufficient documentation

## 2014-02-09 DIAGNOSIS — Z79899 Other long term (current) drug therapy: Secondary | ICD-10-CM | POA: Insufficient documentation

## 2014-02-09 DIAGNOSIS — M129 Arthropathy, unspecified: Secondary | ICD-10-CM | POA: Insufficient documentation

## 2014-02-09 DIAGNOSIS — F411 Generalized anxiety disorder: Secondary | ICD-10-CM | POA: Insufficient documentation

## 2014-02-09 DIAGNOSIS — Z8739 Personal history of other diseases of the musculoskeletal system and connective tissue: Secondary | ICD-10-CM | POA: Insufficient documentation

## 2014-02-09 DIAGNOSIS — F329 Major depressive disorder, single episode, unspecified: Secondary | ICD-10-CM | POA: Insufficient documentation

## 2014-02-09 DIAGNOSIS — F3289 Other specified depressive episodes: Secondary | ICD-10-CM | POA: Insufficient documentation

## 2014-02-09 DIAGNOSIS — G8929 Other chronic pain: Secondary | ICD-10-CM | POA: Insufficient documentation

## 2014-02-09 DIAGNOSIS — J449 Chronic obstructive pulmonary disease, unspecified: Secondary | ICD-10-CM | POA: Insufficient documentation

## 2014-02-09 DIAGNOSIS — Z880 Allergy status to penicillin: Secondary | ICD-10-CM | POA: Insufficient documentation

## 2014-02-09 DIAGNOSIS — E079 Disorder of thyroid, unspecified: Secondary | ICD-10-CM | POA: Insufficient documentation

## 2014-02-09 MED ORDER — IPRATROPIUM-ALBUTEROL 0.5-2.5 (3) MG/3ML IN SOLN
3.0000 mL | Freq: Once | RESPIRATORY_TRACT | Status: AC
Start: 2014-02-09 — End: 2014-02-09
  Administered 2014-02-09: 3 mL via RESPIRATORY_TRACT
  Filled 2014-02-09: qty 3

## 2014-02-09 MED ORDER — PROMETHAZINE HCL 25 MG PO TABS
25.0000 mg | ORAL_TABLET | Freq: Once | ORAL | Status: AC
  Administered 2014-02-09: 25 mg via ORAL
  Filled 2014-02-09: qty 1

## 2014-02-09 MED ORDER — HYDROMORPHONE HCL PF 1 MG/ML IJ SOLN
0.5000 mg | Freq: Once | INTRAMUSCULAR | Status: AC
Start: 2014-02-09 — End: 2014-02-09
  Administered 2014-02-09: 0.5 mg via INTRAVENOUS
  Filled 2014-02-09: qty 1

## 2014-02-09 MED ORDER — OXYCODONE-ACETAMINOPHEN 5-325 MG PO TABS
2.0000 | ORAL_TABLET | Freq: Once | ORAL | Status: AC
  Administered 2014-02-09: 2 via ORAL
  Filled 2014-02-09: qty 2

## 2014-02-09 MED ORDER — HYDROMORPHONE HCL PF 1 MG/ML IJ SOLN
1.0000 mg | Freq: Once | INTRAMUSCULAR | Status: AC
  Administered 2014-02-09: 1 mg via INTRAVENOUS
  Filled 2014-02-09: qty 1

## 2014-02-09 NOTE — ED Notes (Signed)
Patient taken to the lobby to wait for her mother

## 2014-02-09 NOTE — ED Notes (Signed)
Patient moving legs while laying in bed.  Leaning over to the right stating that "it feels better this way".  Denies numbness.  States she has a history of chronic pain from childhood.  Dr. Christy Gentles aware of need for pain med.

## 2014-02-09 NOTE — ED Notes (Signed)
Pt c/o back pain after helping friend move a dresser and felt something pop on L lower back and pain continued to get worse despite taking meds for back pain PO morphine 15mg  IR at 1000 and 30mg  Ext release at 1630. Pain is 10/10 sharp shooting pain. Pt has a hx of chronic back pain and recent falls 1 week ago and 3 weeks ago- pt did not seek emergent care for either fall.  Pt attempted to treat with alternating heat and ice and has been unable to stand.  Pt also took an albuterol neb prior to EMS ariving EMS noted no SOB or adventitious breath sounds.

## 2014-02-09 NOTE — ED Provider Notes (Signed)
CSN: [id number]     Arrival date & time 02/09/14  0021 History   First MD Initiated Contact with Patient 02/09/14 0431     Chief Complaint  Patient presents with  . Back Pain     Patient is a 43 y.o. female presenting with back pain. The history is provided by the patient.  Back Pain Location:  Lumbar spine Quality:  Stabbing Pain severity:  Severe Onset quality:  Gradual Duration:  1 day Timing:  Constant Progression:  Worsening Chronicity:  Recurrent Relieved by:  Nothing Worsened by:  Ambulation Associated symptoms: numbness   Associated symptoms: no abdominal pain, no bladder incontinence, no bowel incontinence, no fever and no weakness   pt reports she had mechanical fall a week ago onto her back and has had worsened pain since.  She reports yesterday she attempted moving furniture and heard a "pop" in her back She reports h/o chronic back pain and it has worsened No fever/vomiting No new weakness She reports some numbness in left LE No h/o back surgery  While in the ED she felt an asthma attack while waiting and nurse ordered nebs  Past Medical History  Diagnosis Date  . Asthma   . Ulcerative colitis   . Anxiety   . Depression   . Chronic back pain   . Chronic knee pain   . Thyroid disease     hypo  . GERD (gastroesophageal reflux disease)   . Insomnia   . Allergic rhinitis   . Arthritis   . COPD (chronic obstructive pulmonary disease)    Past Surgical History  Procedure Laterality Date  . Knee surgery      x3, two on left and one on right.  Arthoscopy  . Cholecystectomy    . Abdominal hysterectomy    . Cesarean section    . Tonsillectomy    . Joint replacement      2x on left, 1x on right   Family History  Problem Relation Age of Onset  . High blood pressure Mother   . Bipolar disorder Mother   . Asthma Father   . Parkinson's disease Father    History  Substance Use Topics  . Smoking status: Former Smoker -- 1.00 packs/day for 3 years   Types: Cigarettes    Quit date: 10/19/2011  . Smokeless tobacco: Never Used  . Alcohol Use: Yes     Comment: rarely    OB History   Grav Para Term Preterm Abortions TAB SAB Ect Mult Living                 Review of Systems  Constitutional: Negative for fever.  Respiratory: Positive for cough.   Gastrointestinal: Negative for abdominal pain and bowel incontinence.  Genitourinary: Negative for bladder incontinence.  Musculoskeletal: Positive for back pain.  Neurological: Positive for numbness. Negative for weakness.  All other systems reviewed and are negative.     Allergies  Penicillins  Home Medications   Prior to Admission medications   Medication Sig Start Date End Date Taking? Authorizing Provider  albuterol (PROAIR HFA) 108 (90 BASE) MCG/ACT inhaler Inhale 2 puffs into the lungs every 4 (four) hours as needed for wheezing or shortness of breath. 12/28/13  Yes Tammy S Parrett, NP  ALPRAZolam (XANAX) 0.5 MG tablet Take 0.5 mg by mouth 2 (two) times daily.   Yes Historical Provider, MD  buPROPion (WELLBUTRIN XL) 300 MG 24 hr tablet Take 300 mg by mouth every morning.    Yes  Historical Provider, MD  citalopram (CELEXA) 40 MG tablet Take 40 mg by mouth every morning.    Yes Historical Provider, MD  Fluticasone-Salmeterol (ADVAIR DISKUS) 250-50 MCG/DOSE AEPB Inhale 1 puff into the lungs 2 (two) times daily. 12/28/13  Yes Tammy S Parrett, NP  furosemide (LASIX) 40 MG tablet Take 40 mg by mouth daily.   Yes Historical Provider, MD  ipratropium-albuterol (DUONEB) 0.5-2.5 (3) MG/3ML SOLN Take 3 mLs by nebulization every 6 (six) hours as needed (for wheezing). 12/12/13  Yes Thurnell Lose, MD  levothyroxine (SYNTHROID, LEVOTHROID) 150 MCG tablet Take 150 mcg by mouth daily before breakfast.   Yes Historical Provider, MD  montelukast (SINGULAIR) 10 MG tablet Take 10 mg by mouth at bedtime.    Yes Historical Provider, MD  morphine (MS CONTIN) 30 MG 12 hr tablet Take 1 tablet (30 mg  total) by mouth 2 (two) times daily. 04/04/13  Yes Janece Canterbury, MD  morphine (MSIR) 15 MG tablet Take 15 mg by mouth daily as needed for severe pain.   Yes Historical Provider, MD  omeprazole (PRILOSEC) 40 MG capsule Take 40 mg by mouth every morning.    Yes Historical Provider, MD  traZODone (DESYREL) 100 MG tablet Take 100 mg by mouth at bedtime as needed for sleep.    Yes Historical Provider, MD   BP 97/45  Pulse 71  Temp(Src) 98.9 F (37.2 C) (Oral)  Resp 13  Ht 5\' 2"  (1.575 m)  Wt 208 lb (94.348 kg)  BMI 38.03 kg/m2  SpO2 97% Physical Exam CONSTITUTIONAL: Well developed/well nourished, uncomfortable appearing HEAD: Normocephalic/atraumatic EYES: EOMI/PERRL ENMT: Mucous membranes moist NECK: supple no meningeal signs SPINE:lumbar spinal tenderness, No bruising/crepitance/stepoffs noted to spine CV: S1/S2 noted, no murmurs/rubs/gallops noted LUNGS:  no apparent distress ABDOMEN: soft, nontender, no rebound or guarding GU:no cva tenderness NEURO: Awake/alert, equal motor 5/5 strength noted with the following: hip flexion/knee flexion/extension, foot dorsi/plantar flexion, great toe extension intact bilaterally, no clonus bilaterally, no sensory deficit in any dermatome.  Equal patellar/achilles reflex noted (2+) in bilateral lower extremities.  Pt is able to ambulate unassisted. EXTREMITIES: pulses normal, full ROM SKIN: warm, color normal PSYCH: anxious   ED Course  Procedures   Imaging negative Pt appears more comfortable No neuro deficits and she can ambulate Stable for d/c home  Imaging Review Dg Lumbar Spine Complete  02/09/2014   CLINICAL DATA:  Low back pain after moving heavy furniture.  EXAM: LUMBAR SPINE - COMPLETE 4+ VIEW  COMPARISON:  CT abdomen and pelvis 12/09/2013  FINDINGS: There is no evidence of lumbar spine fracture. Alignment is normal. Intervertebral disc spaces are maintained.  IMPRESSION: Negative.   Electronically Signed   By: Lucienne Capers  M.D.   On: 02/09/2014 05:45      MDM   Final diagnoses:  Midline low back pain without sciatica    Nursing notes including past medical history and social history reviewed and considered in documentation     Sharyon Cable, MD 02/09/14 [phone]

## 2014-02-09 NOTE — ED Notes (Signed)
Patient transported to X-ray 

## 2014-02-09 NOTE — Discharge Instructions (Signed)

## 2014-02-09 NOTE — ED Notes (Signed)
2 IV attempts no success

## 2014-02-09 NOTE — ED Notes (Signed)
Patient crying stating that she cannot breath.  No distress noted with sats 98% RA

## 2014-02-09 NOTE — ED Notes (Signed)
Patient crawled out of the bed and was standing.  Started yelling out and assisted her to the [facility].

## 2014-02-09 NOTE — ED Notes (Signed)
Dr Christy Gentles in to see patient.

## 2014-02-26 ENCOUNTER — Emergency Department (HOSPITAL_BASED_OUTPATIENT_CLINIC_OR_DEPARTMENT_OTHER): Payer: Self-pay

## 2014-02-26 ENCOUNTER — Inpatient Hospital Stay (HOSPITAL_BASED_OUTPATIENT_CLINIC_OR_DEPARTMENT_OTHER)
Admission: EM | Admit: 2014-02-26 | Discharge: 2014-02-28 | DRG: 190 | Disposition: A | Discharge status: Home / Self Care | Payer: Self-pay | Attending: Internal Medicine | Admitting: Internal Medicine

## 2014-02-26 ENCOUNTER — Encounter (HOSPITAL_BASED_OUTPATIENT_CLINIC_OR_DEPARTMENT_OTHER): Payer: Self-pay | Admitting: Emergency Medicine

## 2014-02-26 DIAGNOSIS — Z9981 Dependence on supplemental oxygen: Secondary | ICD-10-CM

## 2014-02-26 DIAGNOSIS — J189 Pneumonia, unspecified organism: Secondary | ICD-10-CM

## 2014-02-26 DIAGNOSIS — K219 Gastro-esophageal reflux disease without esophagitis: Secondary | ICD-10-CM

## 2014-02-26 DIAGNOSIS — D72829 Elevated white blood cell count, unspecified: Secondary | ICD-10-CM

## 2014-02-26 DIAGNOSIS — M549 Dorsalgia, unspecified: Secondary | ICD-10-CM | POA: Diagnosis present

## 2014-02-26 DIAGNOSIS — E079 Disorder of thyroid, unspecified: Secondary | ICD-10-CM

## 2014-02-26 DIAGNOSIS — F3289 Other specified depressive episodes: Secondary | ICD-10-CM | POA: Diagnosis present

## 2014-02-26 DIAGNOSIS — J45901 Unspecified asthma with (acute) exacerbation: Principal | ICD-10-CM

## 2014-02-26 DIAGNOSIS — G8929 Other chronic pain: Secondary | ICD-10-CM | POA: Diagnosis present

## 2014-02-26 DIAGNOSIS — J441 Chronic obstructive pulmonary disease with (acute) exacerbation: Principal | ICD-10-CM

## 2014-02-26 DIAGNOSIS — M25569 Pain in unspecified knee: Secondary | ICD-10-CM | POA: Diagnosis present

## 2014-02-26 DIAGNOSIS — J154 Pneumonia due to other streptococci: Secondary | ICD-10-CM

## 2014-02-26 DIAGNOSIS — I509 Heart failure, unspecified: Secondary | ICD-10-CM | POA: Diagnosis present

## 2014-02-26 DIAGNOSIS — Z9660 Presence of unspecified orthopedic joint implant: Secondary | ICD-10-CM

## 2014-02-26 DIAGNOSIS — G4700 Insomnia, unspecified: Secondary | ICD-10-CM

## 2014-02-26 DIAGNOSIS — F419 Anxiety disorder, unspecified: Secondary | ICD-10-CM

## 2014-02-26 DIAGNOSIS — J9600 Acute respiratory failure, unspecified whether with hypoxia or hypercapnia: Secondary | ICD-10-CM

## 2014-02-26 DIAGNOSIS — E876 Hypokalemia: Secondary | ICD-10-CM

## 2014-02-26 DIAGNOSIS — Y95 Nosocomial condition: Secondary | ICD-10-CM

## 2014-02-26 DIAGNOSIS — I5031 Acute diastolic (congestive) heart failure: Secondary | ICD-10-CM | POA: Diagnosis present

## 2014-02-26 DIAGNOSIS — M1990 Unspecified osteoarthritis, unspecified site: Secondary | ICD-10-CM

## 2014-02-26 DIAGNOSIS — F32A Depression, unspecified: Secondary | ICD-10-CM

## 2014-02-26 DIAGNOSIS — E034 Atrophy of thyroid (acquired): Secondary | ICD-10-CM

## 2014-02-26 DIAGNOSIS — M129 Arthropathy, unspecified: Secondary | ICD-10-CM | POA: Diagnosis present

## 2014-02-26 DIAGNOSIS — J9601 Acute respiratory failure with hypoxia: Secondary | ICD-10-CM

## 2014-02-26 DIAGNOSIS — J9620 Acute and chronic respiratory failure, unspecified whether with hypoxia or hypercapnia: Secondary | ICD-10-CM | POA: Diagnosis present

## 2014-02-26 DIAGNOSIS — J9621 Acute and chronic respiratory failure with hypoxia: Secondary | ICD-10-CM

## 2014-02-26 DIAGNOSIS — Z87891 Personal history of nicotine dependence: Secondary | ICD-10-CM

## 2014-02-26 DIAGNOSIS — R0902 Hypoxemia: Secondary | ICD-10-CM

## 2014-02-26 DIAGNOSIS — F411 Generalized anxiety disorder: Secondary | ICD-10-CM

## 2014-02-26 DIAGNOSIS — D649 Anemia, unspecified: Secondary | ICD-10-CM

## 2014-02-26 DIAGNOSIS — F329 Major depressive disorder, single episode, unspecified: Secondary | ICD-10-CM | POA: Diagnosis present

## 2014-02-26 DIAGNOSIS — E039 Hypothyroidism, unspecified: Secondary | ICD-10-CM | POA: Diagnosis present

## 2014-02-26 DIAGNOSIS — Z79899 Other long term (current) drug therapy: Secondary | ICD-10-CM

## 2014-02-26 LAB — BASIC METABOLIC PANEL
Anion gap: 16 — ABNORMAL HIGH (ref 5–15)
BUN: 9 mg/dL (ref 6–23)
CALCIUM: 9.1 mg/dL (ref 8.4–10.5)
CO2: 28 mEq/L (ref 19–32)
CREATININE: 0.8 mg/dL (ref 0.50–1.10)
Chloride: 99 mEq/L (ref 96–112)
GFR, EST NON AFRICAN AMERICAN: 88 mL/min — AB (ref >90)
Glucose, Bld: 143 mg/dL — ABNORMAL HIGH (ref 70–99)
Potassium: 3.2 mEq/L — ABNORMAL LOW (ref 3.7–5.3)
Sodium: 143 mEq/L (ref 137–147)

## 2014-02-26 LAB — CBC WITH DIFFERENTIAL/PLATELET
BASOS PCT: 0 % (ref 0–1)
Basophils Absolute: 0 10*3/uL (ref 0.0–0.1)
EOS PCT: 0 % (ref 0–5)
Eosinophils Absolute: 0 10*3/uL (ref 0.0–0.7)
HEMATOCRIT: 38.6 % (ref 36.0–46.0)
Hemoglobin: 12.2 g/dL (ref 12.0–15.0)
Lymphocytes Relative: 7 % — ABNORMAL LOW (ref 12–46)
Lymphs Abs: 0.4 10*3/uL — ABNORMAL LOW (ref 0.7–4.0)
MCH: 27.4 pg (ref 26.0–34.0)
MCHC: 31.6 g/dL (ref 30.0–36.0)
MCV: 86.5 fL (ref 78.0–100.0)
MONO ABS: 0.2 10*3/uL (ref 0.1–1.0)
Monocytes Relative: 3 % (ref 3–12)
Neutro Abs: 5.8 10*3/uL (ref 1.7–7.7)
Neutrophils Relative %: 91 % — ABNORMAL HIGH (ref 43–77)
PLATELETS: 225 10*3/uL (ref 150–400)
RBC: 4.46 MIL/uL (ref 3.87–5.11)
RDW: 17.7 % — ABNORMAL HIGH (ref 11.5–15.5)
WBC: 6.4 10*3/uL (ref 4.0–10.5)

## 2014-02-26 LAB — PRO B NATRIURETIC PEPTIDE: PRO B NATRI PEPTIDE: 121.7 pg/mL (ref 0–125)

## 2014-02-26 LAB — CBC
HCT: 39.5 % (ref 36.0–46.0)
Hemoglobin: 12.3 g/dL (ref 12.0–15.0)
MCH: 26.8 pg (ref 26.0–34.0)
MCHC: 31.1 g/dL (ref 30.0–36.0)
MCV: 86.1 fL (ref 78.0–100.0)
Platelets: 225 10*3/uL (ref 150–400)
RBC: 4.59 MIL/uL (ref 3.87–5.11)
RDW: 17.9 % — ABNORMAL HIGH (ref 11.5–15.5)
WBC: 5.4 10*3/uL (ref 4.0–10.5)

## 2014-02-26 LAB — CREATININE, SERUM
CREATININE: 0.72 mg/dL (ref 0.50–1.10)
GFR calc Af Amer: >90 mL/min (ref >90)

## 2014-02-26 LAB — MRSA PCR SCREENING: MRSA BY PCR: NEGATIVE

## 2014-02-26 LAB — TROPONIN I

## 2014-02-26 MED ORDER — MORPHINE SULFATE ER 30 MG PO TBCR
30.0000 mg | EXTENDED_RELEASE_TABLET | Freq: Two times a day (BID) | ORAL | Status: DC
  Administered 2014-02-26 – 2014-02-28 (×4): 30 mg via ORAL
  Filled 2014-02-26 (×3): qty 2
  Filled 2014-02-26: qty 1

## 2014-02-26 MED ORDER — MONTELUKAST SODIUM 10 MG PO TABS
10.0000 mg | ORAL_TABLET | Freq: Every day | ORAL | Status: DC
  Administered 2014-02-26 – 2014-02-27 (×2): 10 mg via ORAL
  Filled 2014-02-26 (×4): qty 1

## 2014-02-26 MED ORDER — LEVOFLOXACIN IN D5W 500 MG/100ML IV SOLN
500.0000 mg | INTRAVENOUS | Status: DC
Start: 2014-02-26 — End: 2014-02-26

## 2014-02-26 MED ORDER — FUROSEMIDE 40 MG PO TABS
40.0000 mg | ORAL_TABLET | Freq: Every day | ORAL | Status: DC
  Filled 2014-02-26: qty 1

## 2014-02-26 MED ORDER — GUAIFENESIN ER 600 MG PO TB12
600.0000 mg | ORAL_TABLET | Freq: Two times a day (BID) | ORAL | Status: DC
  Administered 2014-02-26 – 2014-02-28 (×4): 600 mg via ORAL
  Filled 2014-02-26 (×6): qty 1

## 2014-02-26 MED ORDER — IPRATROPIUM BROMIDE 0.02 % IN SOLN
0.5000 mg | Freq: Once | RESPIRATORY_TRACT | Status: AC
  Administered 2014-02-26: 0.5 mg via RESPIRATORY_TRACT

## 2014-02-26 MED ORDER — MORPHINE SULFATE 15 MG PO TABS
15.0000 mg | ORAL_TABLET | Freq: Every day | ORAL | Status: DC | PRN
  Administered 2014-02-26 – 2014-02-27 (×2): 15 mg via ORAL
  Filled 2014-02-26 (×2): qty 1

## 2014-02-26 MED ORDER — ALBUTEROL SULFATE (2.5 MG/3ML) 0.083% IN NEBU
2.5000 mg | INHALATION_SOLUTION | Freq: Four times a day (QID) | RESPIRATORY_TRACT | Status: DC
  Administered 2014-02-26 – 2014-02-27 (×2): 2.5 mg via RESPIRATORY_TRACT
  Filled 2014-02-26 (×2): qty 3

## 2014-02-26 MED ORDER — PROMETHAZINE HCL 25 MG/ML IJ SOLN
12.5000 mg | Freq: Once | INTRAMUSCULAR | Status: AC
  Administered 2014-02-26: 12.5 mg via INTRAVENOUS
  Filled 2014-02-26: qty 1

## 2014-02-26 MED ORDER — METHYLPREDNISOLONE SODIUM SUCC 125 MG IJ SOLR
60.0000 mg | Freq: Four times a day (QID) | INTRAMUSCULAR | Status: DC
  Administered 2014-02-26 – 2014-02-28 (×7): 60 mg via INTRAVENOUS
  Filled 2014-02-26: qty 2
  Filled 2014-02-26 (×3): qty 0.96
  Filled 2014-02-26: qty 2
  Filled 2014-02-26 (×2): qty 0.96
  Filled 2014-02-26: qty 2
  Filled 2014-02-26: qty 0.96
  Filled 2014-02-26: qty 2
  Filled 2014-02-26: qty 0.96
  Filled 2014-02-26: qty 2
  Filled 2014-02-26 (×4): qty 0.96

## 2014-02-26 MED ORDER — ONDANSETRON HCL 4 MG PO TABS: 4.0000 mg | ORAL_TABLET | Freq: Four times a day (QID) | ORAL | Status: DC | PRN

## 2014-02-26 MED ORDER — PANTOPRAZOLE SODIUM 40 MG PO TBEC
40.0000 mg | DELAYED_RELEASE_TABLET | Freq: Every day | ORAL | Status: DC
  Administered 2014-02-27 – 2014-02-28 (×2): 40 mg via ORAL
  Filled 2014-02-26 (×2): qty 1

## 2014-02-26 MED ORDER — HYDROCODONE-ACETAMINOPHEN 5-325 MG PO TABS
1.0000 | ORAL_TABLET | Freq: Once | ORAL | Status: AC
  Administered 2014-02-26: 1 via ORAL
  Filled 2014-02-26: qty 1

## 2014-02-26 MED ORDER — ALBUTEROL (5 MG/ML) CONTINUOUS INHALATION SOLN
INHALATION_SOLUTION | RESPIRATORY_TRACT | Status: AC
Start: 2014-02-26 — End: 2014-02-26
  Filled 2014-02-26: qty 20

## 2014-02-26 MED ORDER — LEVOTHYROXINE SODIUM 150 MCG PO TABS
150.0000 ug | ORAL_TABLET | Freq: Every day | ORAL | Status: DC
  Administered 2014-02-27 – 2014-02-28 (×2): 150 ug via ORAL
  Filled 2014-02-26 (×3): qty 1

## 2014-02-26 MED ORDER — IPRATROPIUM BROMIDE 0.02 % IN SOLN
RESPIRATORY_TRACT | Status: AC
  Administered 2014-02-26: 0.5 mg via RESPIRATORY_TRACT
  Filled 2014-02-26: qty 2.5

## 2014-02-26 MED ORDER — METHYLPREDNISOLONE SODIUM SUCC 125 MG IJ SOLR
125.0000 mg | Freq: Once | INTRAMUSCULAR | Status: AC
  Administered 2014-02-26: 125 mg via INTRAVENOUS
  Filled 2014-02-26: qty 2

## 2014-02-26 MED ORDER — ZOLPIDEM TARTRATE 5 MG PO TABS: 5.0000 mg | ORAL_TABLET | Freq: Every evening | ORAL | Status: DC | PRN

## 2014-02-26 MED ORDER — MORPHINE SULFATE 2 MG/ML IJ SOLN
1.0000 mg | INTRAMUSCULAR | Status: DC | PRN
  Administered 2014-02-27 (×2): 1 mg via INTRAVENOUS
  Filled 2014-02-26 (×2): qty 1

## 2014-02-26 MED ORDER — ALBUTEROL SULFATE (2.5 MG/3ML) 0.083% IN NEBU: 2.5000 mg | INHALATION_SOLUTION | RESPIRATORY_TRACT | Status: DC | PRN

## 2014-02-26 MED ORDER — TRAZODONE HCL 50 MG PO TABS: 100.0000 mg | ORAL_TABLET | Freq: Every evening | ORAL | Status: DC | PRN

## 2014-02-26 MED ORDER — MOMETASONE FURO-FORMOTEROL FUM 100-5 MCG/ACT IN AERO
2.0000 | INHALATION_SPRAY | Freq: Two times a day (BID) | RESPIRATORY_TRACT | Status: DC
  Administered 2014-02-26 – 2014-02-28 (×4): 2 via RESPIRATORY_TRACT
  Filled 2014-02-26: qty 8.8

## 2014-02-26 MED ORDER — IPRATROPIUM BROMIDE 0.02 % IN SOLN
0.5000 mg | Freq: Four times a day (QID) | RESPIRATORY_TRACT | Status: DC
  Administered 2014-02-26 – 2014-02-27 (×2): 0.5 mg via RESPIRATORY_TRACT
  Filled 2014-02-26 (×2): qty 2.5

## 2014-02-26 MED ORDER — ENOXAPARIN SODIUM 40 MG/0.4ML ~~LOC~~ SOLN
40.0000 mg | SUBCUTANEOUS | Status: DC
  Administered 2014-02-26 – 2014-02-27 (×2): 40 mg via SUBCUTANEOUS
  Filled 2014-02-26 (×4): qty 0.4

## 2014-02-26 MED ORDER — OXYCODONE HCL 5 MG PO TABS
5.0000 mg | ORAL_TABLET | ORAL | Status: DC | PRN
  Administered 2014-02-27: 5 mg via ORAL
  Filled 2014-02-26: qty 1

## 2014-02-26 MED ORDER — CITALOPRAM HYDROBROMIDE 40 MG PO TABS
40.0000 mg | ORAL_TABLET | Freq: Every morning | ORAL | Status: DC
  Administered 2014-02-27 – 2014-02-28 (×2): 40 mg via ORAL
  Filled 2014-02-26 (×2): qty 1

## 2014-02-26 MED ORDER — IPRATROPIUM-ALBUTEROL 0.5-2.5 (3) MG/3ML IN SOLN
3.0000 mL | RESPIRATORY_TRACT | Status: DC | PRN
  Administered 2014-02-26: 3 mL via RESPIRATORY_TRACT
  Filled 2014-02-26: qty 3

## 2014-02-26 MED ORDER — ALPRAZOLAM 0.5 MG PO TABS
0.5000 mg | ORAL_TABLET | Freq: Two times a day (BID) | ORAL | Status: DC
  Administered 2014-02-26 – 2014-02-28 (×4): 0.5 mg via ORAL
  Filled 2014-02-26 (×4): qty 1

## 2014-02-26 MED ORDER — ALBUTEROL (5 MG/ML) CONTINUOUS INHALATION SOLN
20.0000 mg/h | INHALATION_SOLUTION | Freq: Once | RESPIRATORY_TRACT | Status: AC
  Administered 2014-02-26: 20 mg/h via RESPIRATORY_TRACT

## 2014-02-26 MED ORDER — LEVOFLOXACIN IN D5W 500 MG/100ML IV SOLN
500.0000 mg | INTRAVENOUS | Status: DC
  Administered 2014-02-26 – 2014-02-27 (×2): 500 mg via INTRAVENOUS
  Filled 2014-02-26 (×3): qty 100

## 2014-02-26 MED ORDER — BUPROPION HCL ER (XL) 300 MG PO TB24
300.0000 mg | ORAL_TABLET | Freq: Every morning | ORAL | Status: DC
  Administered 2014-02-27 – 2014-02-28 (×2): 300 mg via ORAL
  Filled 2014-02-26 (×2): qty 1

## 2014-02-26 MED ORDER — ONDANSETRON HCL 4 MG/2ML IJ SOLN: 4.0000 mg | Freq: Four times a day (QID) | INTRAMUSCULAR | Status: DC | PRN

## 2014-02-26 NOTE — ED Notes (Signed)
20mg  CAT d/c by MD.

## 2014-02-26 NOTE — H&P (Signed)
[facility]                                                                                    Patient Demographics  Debra [name], is a 43 y.o. female  CSN: [id number]  MRN: [id number]  DOB - 1970/09/23  Admit Date - 02/26/2014  Outpatient Primary MD for the patient is Guadlupe Spanish, MD   With History of -  Past Medical History  Diagnosis Date  . Asthma   . Ulcerative colitis   . Anxiety   . Depression   . Chronic back pain   . Chronic knee pain   . Thyroid disease     hypo  . GERD (gastroesophageal reflux disease)   . Insomnia   . Allergic rhinitis   . Arthritis   . COPD (chronic obstructive pulmonary disease)       Past Surgical History  Procedure Laterality Date  . Knee surgery      x3, two on left and one on right.  Arthoscopy  . Cholecystectomy    . Abdominal hysterectomy    . Cesarean section    . Tonsillectomy    . Joint replacement      2x on left, 1x on right    in for   Chief Complaint  Patient presents with  . Shortness of Breath     HPI  Debra [name]  is a 43 y.o. female, with past medical history significant for asthma and COPD presenting with few days history of shortness of breath, cough with greenish sputum. Reports nausea but no vomiting. Reports feverishness but no documented fever, no abdominal pain no diarrhea. Denies dizziness or loss of consciousness. Patient reports multiple admissions for shortness of breath and pneumonia in the last 1 year and she was supposed to followup with her pulmonologist when this happened. The patient said that she was out of her neb treatments at home    Review of Systems    In addition to the HPI above,  No Headache, No changes with Vision or hearing, No problems swallowing food or Liquids, No Chest pain, No Abdominal pain, No Vommitting, Bowel movements are regular, No Blood in stool or Urine, No dysuria, No new skin rashes or bruises, No new joints pains-aches,  No new  weakness, tingling, numbness in any extremity, No recent weight gain or loss, No polyuria, polydypsia or polyphagia, No significant Mental Stressors.  A full 10 point Review of Systems was done, except as stated above, all other Review of Systems were negative.   Social History History  Substance Use Topics  . Smoking status: Former Smoker -- 1.00 packs/day for 3 years    Types: Cigarettes    Quit date: 10/19/2011  . Smokeless tobacco: Never Used  . Alcohol Use: Yes     Comment: rarely      Family History Family History  Problem Relation Age of Onset  . High blood pressure Mother   . Bipolar disorder Mother   . Asthma Father   . Parkinson's disease Father      Prior to Admission medications   Medication Sig Start Date End Date Taking? Authorizing Provider  albuterol (  PROAIR HFA) 108 (90 BASE) MCG/ACT inhaler Inhale 2 puffs into the lungs every 4 (four) hours as needed for wheezing or shortness of breath. 12/28/13  Yes Tammy S Parrett, NP  ALPRAZolam (XANAX) 0.5 MG tablet Take 0.5 mg by mouth 2 (two) times daily.   Yes Historical Provider, MD  buPROPion (WELLBUTRIN XL) 300 MG 24 hr tablet Take 300 mg by mouth every morning.    Yes Historical Provider, MD  citalopram (CELEXA) 40 MG tablet Take 40 mg by mouth every morning.    Yes Historical Provider, MD  Fluticasone-Salmeterol (ADVAIR DISKUS) 250-50 MCG/DOSE AEPB Inhale 1 puff into the lungs 2 (two) times daily. 12/28/13  Yes Tammy S Parrett, NP  furosemide (LASIX) 40 MG tablet Take 40 mg by mouth daily.   Yes Historical Provider, MD  ipratropium-albuterol (DUONEB) 0.5-2.5 (3) MG/3ML SOLN Take 3 mLs by nebulization every 6 (six) hours as needed (for wheezing). 12/12/13  Yes Thurnell Lose, MD  levothyroxine (SYNTHROID, LEVOTHROID) 150 MCG tablet Take 150 mcg by mouth daily before breakfast.   Yes Historical Provider, MD  montelukast (SINGULAIR) 10 MG tablet Take 10 mg by mouth at bedtime.    Yes Historical Provider, MD  morphine  (MS CONTIN) 30 MG 12 hr tablet Take 1 tablet (30 mg total) by mouth 2 (two) times daily. 04/04/13  Yes Janece Canterbury, MD  morphine (MSIR) 15 MG tablet Take 15 mg by mouth daily as needed for severe pain.   Yes Historical Provider, MD  omeprazole (PRILOSEC) 40 MG capsule Take 40 mg by mouth every morning.    Yes Historical Provider, MD  traZODone (DESYREL) 100 MG tablet Take 100 mg by mouth at bedtime as needed for sleep.    Yes Historical Provider, MD    Allergies  Allergen Reactions  . Penicillins Swelling and Rash    Physical Exam  Vitals  Blood pressure 115/74, pulse 65, temperature 98.9 F (37.2 C), temperature source Oral, resp. rate 16, height 5\' 2"  (1.575 m), weight 94.802 kg (209 lb), SpO2 96.00%.   1. General middle-aged white American female in moderate shortness of breath  2. Normal affect and insight, Not Suicidal or Homicidal, Awake Alert, Oriented X 3.  3. No F.N deficits, ALL C.Nerves Intact, Strength 5/5 all 4 extremities, Sensation intact all 4 extremities, Plantars down going.  4. Ears and Eyes appear Normal, Conjunctivae clear, PERRLA. Moist Oral Mucosa.  5. Supple Neck, No JVD, No cervical lymphadenopathy appriciated, No Carotid Bruits.  6. Symmetrical Chest wall movement, scattered rhonchi and wheezing  7. RRR, No Gallops, Rubs or Murmurs, No Parasternal Heave.  8. Positive Bowel Sounds, Abdomen Soft, Non tender, No organomegaly appriciated,No rebound -guarding or rigidity.  9.  No Cyanosis, Normal Skin Turgor, No Skin Rash or Bruise.  10. Good muscle tone,  joints appear normal , no effusions, Normal ROM.  11. No Palpable Lymph Nodes in Neck or Axillae    Data Review  CBC  Recent Labs Lab 02/26/14 1205  WBC 6.4  HGB 12.2  HCT 38.6  PLT 225  MCV 86.5  MCH 27.4  MCHC 31.6  RDW 17.7*  LYMPHSABS 0.4*  MONOABS 0.2  EOSABS 0.0  BASOSABS 0.0    ------------------------------------------------------------------------------------------------------------------  Chemistries   Recent Labs Lab 02/26/14 1205  NA 143  K 3.2*  CL 99  CO2 28  GLUCOSE 143*  BUN 9  CREATININE 0.80  CALCIUM 9.1   ------------------------------------------------------------------------------------------------------------------ estimated creatinine clearance is 96.3 ml/min (by C-G formula based on Cr  of 0.8). ------------------------------------------------------------------------------------------------------------------ No results found for this basename: TSH, T4TOTAL, FREET3, T3FREE, THYROIDAB,  in the last 72 hours   Coagulation profile No results found for this basename: INR, PROTIME,  in the last 168 hours ------------------------------------------------------------------------------------------------------------------- No results found for this basename: DDIMER,  in the last 72 hours -------------------------------------------------------------------------------------------------------------------  Cardiac Enzymes  Recent Labs Lab 02/26/14 1205  TROPONINI <0.30   ------------------------------------------------------------------------------------------------------------------ No components found with this basename: POCBNP,    ---------------------------------------------------------------------------------------------------------------  Urinalysis    Component Value Date/Time   COLORURINE YELLOW 11/16/2013 1721   APPEARANCEUR CLEAR 11/16/2013 1721   LABSPEC 1.007 11/16/2013 1721   PHURINE 5.5 11/16/2013 1721   GLUCOSEU NEGATIVE 11/16/2013 1721   HGBUR NEGATIVE 11/16/2013 1721   BILIRUBINUR NEGATIVE 11/16/2013 1721   KETONESUR NEGATIVE 11/16/2013 1721   PROTEINUR NEGATIVE 11/16/2013 1721   UROBILINOGEN 0.2 11/16/2013 1721   NITRITE NEGATIVE 11/16/2013 1721   LEUKOCYTESUR NEGATIVE 11/16/2013 1721     ----------------------------------------------------------------------------------------------------------------     Imaging results:   Dg Lumbar Spine Complete  02/09/2014   CLINICAL DATA:  Low back pain after moving heavy furniture.  EXAM: LUMBAR SPINE - COMPLETE 4+ VIEW  COMPARISON:  CT abdomen and pelvis 12/09/2013  FINDINGS: There is no evidence of lumbar spine fracture. Alignment is normal. Intervertebral disc spaces are maintained.  IMPRESSION: Negative.   Electronically Signed   By: Lucienne Capers M.D.   On: 02/09/2014 05:45   Dg Chest Portable 1 View  02/26/2014   CLINICAL DATA:  Shortness of breath with congestion and history of recent pneumonia ; history of COPD  EXAM: PORTABLE CHEST - 1 VIEW  COMPARISON:  PA and lateral chest x-ray of Dec 28, 2013  FINDINGS: The lungs are well-expanded. There is no focal infiltrate. There is no pleural effusion or pneumothorax. The cardiac silhouette is top-normal in size. The central pulmonary vascularity is mildly prominent and has become more conspicuous since the previous study. The bony thorax is unremarkable.  IMPRESSION: COPD with mild central pulmonary vascular prominence which may reflect low-grade compensated CHF. There is no focal pneumonia.   Electronically Signed   By: David  Martinique   On: 02/26/2014 12:37      Assessment & Plan  1. COPD exacerbation/bronchitis     Solu-Medrol IV     Nebulizer treatments     Oxygen 2. ? pulmonary edema by chest x-ray     Normal BNP     Normal echo on 11/2013   DVT Prophylaxis Lovenox  AM Labs Ordered, also please review Full Orders  Code Status full  Disposition Plan: Home  Time spent in minutes : 35 minutes  Condition GUARDED   @SIGNATURE @

## 2014-02-26 NOTE — ED Provider Notes (Signed)
CSN: [id number]     Arrival date & time 02/26/14  1133 History   First MD Initiated Contact with Patient 02/26/14 1143     Chief Complaint  Patient presents with  . Shortness of Breath     (Consider location/radiation/quality/duration/timing/severity/associated sxs/prior Treatment) HPI  This a 43 year old female with history of COPD, recurrent pneumonia, ulcerative colitis who presents with cough and shortness of breath. Patient reports onset of symptoms Sunday. She reports low-grade fevers at home to 100. She reports that she's been using her nebulizer frequently without relief. Patient also reports that she had previously been on an prednisone taper. Last dosage was 20 mg. She states that she increase her dosage to 50 mg over the last 2-3 days and that does not seem to be helping. She endorses a productive cough. She also endorses chest tightness.  No known sick contacts.  Last hospitalization was in April.  Past Medical History  Diagnosis Date  . Asthma   . Ulcerative colitis   . Anxiety   . Depression   . Chronic back pain   . Chronic knee pain   . Thyroid disease     hypo  . GERD (gastroesophageal reflux disease)   . Insomnia   . Allergic rhinitis   . Arthritis   . COPD (chronic obstructive pulmonary disease)    Past Surgical History  Procedure Laterality Date  . Knee surgery      x3, two on left and one on right.  Arthoscopy  . Cholecystectomy    . Abdominal hysterectomy    . Cesarean section    . Tonsillectomy    . Joint replacement      2x on left, 1x on right   Family History  Problem Relation Age of Onset  . High blood pressure Mother   . Bipolar disorder Mother   . Asthma Father   . Parkinson's disease Father    History  Substance Use Topics  . Smoking status: Former Smoker -- 1.00 packs/day for 3 years    Types: Cigarettes    Quit date: 10/19/2011  . Smokeless tobacco: Never Used  . Alcohol Use: Yes     Comment: rarely    OB History   Grav Para  Term Preterm Abortions TAB SAB Ect Mult Living                 Review of Systems  Constitutional: Positive for fever.  Respiratory: Positive for cough, chest tightness and shortness of breath.   Cardiovascular: Positive for chest pain. Negative for palpitations and leg swelling.  Gastrointestinal: Negative for nausea, vomiting and abdominal pain.  Genitourinary: Negative for dysuria.  Musculoskeletal: Negative for back pain.  Neurological: Negative for headaches.  All other systems reviewed and are negative.     Allergies  Penicillins  Home Medications   Prior to Admission medications   Medication Sig Start Date End Date Taking? Authorizing Provider  albuterol (PROAIR HFA) 108 (90 BASE) MCG/ACT inhaler Inhale 2 puffs into the lungs every 4 (four) hours as needed for wheezing or shortness of breath. 12/28/13  Yes Tammy S Parrett, NP  ALPRAZolam (XANAX) 0.5 MG tablet Take 0.5 mg by mouth 2 (two) times daily.   Yes Historical Provider, MD  buPROPion (WELLBUTRIN XL) 300 MG 24 hr tablet Take 300 mg by mouth every morning.    Yes Historical Provider, MD  citalopram (CELEXA) 40 MG tablet Take 40 mg by mouth every morning.    Yes Historical Provider, MD  Fluticasone-Salmeterol (ADVAIR DISKUS) 250-50 MCG/DOSE AEPB Inhale 1 puff into the lungs 2 (two) times daily. 12/28/13  Yes Tammy S Parrett, NP  furosemide (LASIX) 40 MG tablet Take 40 mg by mouth daily.   Yes Historical Provider, MD  ipratropium-albuterol (DUONEB) 0.5-2.5 (3) MG/3ML SOLN Take 3 mLs by nebulization every 6 (six) hours as needed (for wheezing). 12/12/13  Yes Thurnell Lose, MD  levothyroxine (SYNTHROID, LEVOTHROID) 150 MCG tablet Take 150 mcg by mouth daily before breakfast.   Yes Historical Provider, MD  montelukast (SINGULAIR) 10 MG tablet Take 10 mg by mouth at bedtime.    Yes Historical Provider, MD  morphine (MS CONTIN) 30 MG 12 hr tablet Take 1 tablet (30 mg total) by mouth 2 (two) times daily. 04/04/13  Yes Janece Canterbury, MD  morphine (MSIR) 15 MG tablet Take 15 mg by mouth daily as needed for severe pain.   Yes Historical Provider, MD  omeprazole (PRILOSEC) 40 MG capsule Take 40 mg by mouth every morning.    Yes Historical Provider, MD  traZODone (DESYREL) 100 MG tablet Take 100 mg by mouth at bedtime as needed for sleep.    Yes Historical Provider, MD   BP 108/62  Pulse 78  Temp(Src) 99.3 F (37.4 C) (Oral)  Resp 18  Ht 5\' 2"  (1.575 m)  Wt 209 lb (94.802 kg)  BMI 38.22 kg/m2  SpO2 100% Physical Exam  Nursing note and vitals reviewed. Constitutional: She is oriented to person, place, and time.  Tachypnea, increased work of breathing, overweight  HENT:  Head: Normocephalic and atraumatic.  Mouth/Throat: Oropharynx is clear and moist.  Eyes: Pupils are equal, round, and reactive to light.  Neck: Neck supple.  Cardiovascular: Normal rate, regular rhythm and normal heart sounds.   Pulmonary/Chest: She has wheezes.  Increased work of breathing, expiratory wheeze noted diffusely, tachypnea, actively coughing  Abdominal: Soft. Bowel sounds are normal. There is no tenderness. There is no rebound.  Musculoskeletal: She exhibits no edema.  Neurological: She is alert and oriented to person, place, and time.  Skin: Skin is warm and dry.  Psychiatric: She has a normal mood and affect.    ED Course  Procedures (including critical care time) Labs Review Labs Reviewed  CBC WITH DIFFERENTIAL - Abnormal; Notable for the following:    RDW 17.7 (*)    Neutrophils Relative % 91 (*)    Lymphocytes Relative 7 (*)    Lymphs Abs 0.4 (*)    All other components within normal limits  BASIC METABOLIC PANEL - Abnormal; Notable for the following:    Potassium 3.2 (*)    Glucose, Bld 143 (*)    GFR calc non Af Amer 88 (*)    Anion gap 16 (*)    All other components within normal limits  CULTURE, BLOOD (ROUTINE X 2)  CULTURE, BLOOD (ROUTINE X 2)  TROPONIN I  PRO B NATRIURETIC PEPTIDE    Imaging  Review Dg Chest Portable 1 View  02/26/2014   CLINICAL DATA:  Shortness of breath with congestion and history of recent pneumonia ; history of COPD  EXAM: PORTABLE CHEST - 1 VIEW  COMPARISON:  PA and lateral chest x-ray of Dec 28, 2013  FINDINGS: The lungs are well-expanded. There is no focal infiltrate. There is no pleural effusion or pneumothorax. The cardiac silhouette is top-normal in size. The central pulmonary vascularity is mildly prominent and has become more conspicuous since the previous study. The bony thorax is unremarkable.  IMPRESSION: COPD  with mild central pulmonary vascular prominence which may reflect low-grade compensated CHF. There is no focal pneumonia.   Electronically Signed   By: David  Martinique   On: 02/26/2014 12:37     EKG Interpretation   Date/Time:  Friday February 26 2014 12:02:25 EDT Ventricular Rate:  67 PR Interval:  122 QRS Duration: 90 QT Interval:  456 QTC Calculation: 481 R Axis:   30 Text Interpretation:  Normal sinus rhythm Prolonged QT Abnormal ECG  Confirmed by Emunah Texidor  MD, [place] ([id number]) on 02/26/2014 1:59:41 PM      MDM   Final diagnoses:  COPD exacerbation    Patient presents with cough, wheeze, and low-grade fevers at home. She has increased work of breathing and mild respiratory distress on exam. Notable wheezing in all lung fields. Patient was placed on continuous DuoNeb at 20 mg an hour.  Patient was also given IV Solu-Medrol. No evidence of leukocytosis on CBC which I would expect given recent prednisone use.  Troponin and EKG are reassuring. Chest x-ray shows COPD and possibly low-grade CHF but no focal pneumonia. BNP is within normal limits. No known history of CHF. On repeat exam at 2 hours, patient reports improvement of symptoms. She continues to have diffuse expiratory wheezing with slightly better air movement. Will place patient on every 2 hour nebs at this point. No evidence of pneumonia and we'll treat for COPD exacerbation with steroids  and Levaquin. Given pulmonary exam and repeat exam following aggressive treatment, feel patient would benefit from admission with frequent nebs.  Repeat oxygen saturations following DuoNeb 87% on room air. Patient placed on supplemental oxygen with goal oxygen saturations of 90%.  Dr. Wendee Beavers to admit.    Merryl Hacker, MD 02/26/14 1435

## 2014-02-26 NOTE — ED Notes (Signed)
Pt reports cough, shortness of breath, wheeze, since Sunday. Pt reports recent admission for PNA.

## 2014-02-26 NOTE — Progress Notes (Signed)
MD notified that patient has arrived. Awaiting orders

## 2014-02-26 NOTE — Progress Notes (Signed)
MD paged again. Awaiting  Call back

## 2014-02-26 NOTE — Progress Notes (Signed)
  Pt admitted to the unit. Pt is stable, alert and oriented per baseline. Oriented to room, staff, and call bell. Educated to call for any assistance. Bed in lowest position, call bell within reach- will continue to monitor. 

## 2014-02-26 NOTE — Progress Notes (Signed)
MD called back- patient will be seen soon. Will continue to monitor.

## 2014-02-27 DIAGNOSIS — E876 Hypokalemia: Secondary | ICD-10-CM | POA: Diagnosis present

## 2014-02-27 DIAGNOSIS — J9620 Acute and chronic respiratory failure, unspecified whether with hypoxia or hypercapnia: Secondary | ICD-10-CM | POA: Diagnosis present

## 2014-02-27 LAB — EXPECTORATED SPUTUM ASSESSMENT W GRAM STAIN, RFLX TO RESP C

## 2014-02-27 LAB — EXPECTORATED SPUTUM ASSESSMENT W REFEX TO RESP CULTURE

## 2014-02-27 MED ORDER — FUROSEMIDE 10 MG/ML IJ SOLN
40.0000 mg | Freq: Once | INTRAMUSCULAR | Status: AC
  Administered 2014-02-27: 40 mg via INTRAVENOUS
  Filled 2014-02-27: qty 4

## 2014-02-27 MED ORDER — IPRATROPIUM-ALBUTEROL 0.5-2.5 (3) MG/3ML IN SOLN
3.0000 mL | Freq: Four times a day (QID) | RESPIRATORY_TRACT | Status: DC
  Administered 2014-02-27 – 2014-02-28 (×5): 3 mL via RESPIRATORY_TRACT
  Filled 2014-02-27 (×5): qty 3

## 2014-02-27 MED ORDER — POTASSIUM CHLORIDE CRYS ER 20 MEQ PO TBCR
40.0000 meq | EXTENDED_RELEASE_TABLET | Freq: Once | ORAL | Status: AC
  Administered 2014-02-27: 40 meq via ORAL
  Filled 2014-02-27: qty 2

## 2014-02-27 MED ORDER — FUROSEMIDE 40 MG PO TABS
40.0000 mg | ORAL_TABLET | Freq: Every day | ORAL | Status: DC
  Administered 2014-02-27 – 2014-02-28 (×2): 40 mg via ORAL
  Filled 2014-02-27: qty 1

## 2014-02-27 MED ORDER — WHITE PETROLATUM GEL
Status: AC
  Administered 2014-02-27: 12:00:00
  Filled 2014-02-27: qty 5

## 2014-02-27 NOTE — Progress Notes (Signed)
Patient Demographics  Debra [name], is a 43 y.o. female, DOB - 1971-05-01, NID:[id number]  Admit date - 02/26/2014   Admitting Physician Velvet Bathe, MD  Outpatient Primary MD for the patient is Guadlupe Spanish, MD  LOS - 1   Chief Complaint  Patient presents with  . Shortness of Breath           Subjective:   Debra [name] today has, No headache, No chest pain, No abdominal pain - No Nausea, No new weakness tingling or numbness, improved cough and shortness of breath.  Assessment & Plan    1. Acute-on-chronic respiratory failure due to COPD exacerbation and possibly mild acute on chronic diastolic CHF. EF is 60% on recent echo. Patient has chronic respiratory failure due to advanced COPD is on 2 L nasal cannula oxygen at home, agree with IV steroids, scheduled and as needed nebulizer treatments, titration of oxygen to keep pulse ox of 90%, empiric Levaquin, we'll give an extra dose Lasix and place her on fluid restriction as well. Increase activity and ambulate.    2. GERD. On PPI.    3. Chronic pain, depression and anxiety. Home medications continued, counseled to use minimal narcotics. No acute issues.     4. Low potassium. Replace and recheck.     5. Hypothyroidism. On Synthroid continued at home dose.      Code Status: Full  Family Communication: None present  Disposition Plan: Home   Procedures    chest x-ray   Consults      Medications  Scheduled Meds: . ALPRAZolam  0.5 mg Oral BID  . buPROPion  300 mg Oral q morning - 10a  . citalopram  40 mg Oral q morning - 10a  . enoxaparin (LOVENOX) injection  40 mg Subcutaneous Q24H  . furosemide  40 mg Intravenous Once  . furosemide  40 mg Oral Q breakfast  . guaiFENesin  600 mg Oral BID  .  ipratropium-albuterol  3 mL Nebulization Q6H  . levofloxacin (LEVAQUIN) IV  500 mg Intravenous Q24H  . levothyroxine  150 mcg Oral QAC breakfast  . methylPREDNISolone (SOLU-MEDROL) injection  60 mg Intravenous Q6H  . mometasone-formoterol  2 puff Inhalation BID  . montelukast  10 mg Oral QHS  . morphine  30 mg Oral BID  . pantoprazole  40 mg Oral Daily  . potassium chloride  40 mEq Oral Once   Continuous Infusions:  PRN Meds:.albuterol, ipratropium-albuterol, morphine, morphine injection, ondansetron (ZOFRAN) IV, oxyCODONE, traZODone, zolpidem  DVT Prophylaxis  Lovenox    Lab Results  Component Value Date   PLT 225 02/26/2014    Antibiotics   Anti-infectives   Start     Dose/Rate Route Frequency Ordered Stop   02/26/14 1930  levofloxacin (LEVAQUIN) IVPB 500 mg  Status:  Discontinued     500 mg 100 mL/hr over 60 Minutes Intravenous Every 24 hours 02/26/14 1924 02/26/14 1931   02/26/14 1415  levofloxacin (LEVAQUIN) IVPB 500 mg     500 mg 100 mL/hr over 60 Minutes Intravenous Every 24 hours 02/26/14 1402            Objective:   Filed Vitals:   02/27/14 0217 02/27/14 0225 02/27/14 0554 02/27/14 0732  BP: 103/51  105/59  Pulse: 56 58 51   Temp: 98.1 F (36.7 C)  97.7 F (36.5 C)   TempSrc: Oral  Oral   Resp: 15 16 15    Height:      Weight:      SpO2: 94% 94% 94% 98%    Wt Readings from Last 3 Encounters:  02/26/14 94.802 kg (209 lb)  02/09/14 94.348 kg (208 lb)  12/28/13 96.435 kg (212 lb 9.6 oz)     Intake/Output Summary (Last 24 hours) at 02/27/14 1053 Last data filed at 02/27/14 0100  Gross per 24 hour  Intake    924 ml  Output      0 ml  Net    924 ml     Physical Exam  Awake Alert, Oriented X 3, No new F.N deficits, Normal affect [place].AT,PERRAL Supple Neck,No JVD, No cervical lymphadenopathy appriciated.  Symmetrical Chest wall movement, Good air movement bilaterally, bilateral diffuse wheezing and few bibasilar rales RRR,No Gallops,Rubs or new  Murmurs, No Parasternal Heave +ve B.Sounds, Abd Soft, No tenderness, No organomegaly appriciated, No rebound - guarding or rigidity. No Cyanosis, Clubbing or edema, No new Rash or bruise      Data Review   Micro Results Recent Results (from the past 240 hour(s))  MRSA PCR SCREENING     Status: None   Collection Time    02/26/14  6:03 PM      Result Value Ref Range Status   MRSA by PCR NEGATIVE  NEGATIVE Final   Comment:            The GeneXpert MRSA Assay (FDA     approved for NASAL specimens     only), is one component of a     comprehensive MRSA colonization     surveillance program. It is not     intended to diagnose MRSA     infection nor to guide or     monitor treatment for     MRSA infections.    Radiology Reports    Dg Chest Portable 1 View  02/26/2014   CLINICAL DATA:  Shortness of breath with congestion and history of recent pneumonia ; history of COPD  EXAM: PORTABLE CHEST - 1 VIEW  COMPARISON:  PA and lateral chest x-ray of Dec 28, 2013  FINDINGS: The lungs are well-expanded. There is no focal infiltrate. There is no pleural effusion or pneumothorax. The cardiac silhouette is top-normal in size. The central pulmonary vascularity is mildly prominent and has become more conspicuous since the previous study. The bony thorax is unremarkable.  IMPRESSION: COPD with mild central pulmonary vascular prominence which may reflect low-grade compensated CHF. There is no focal pneumonia.   Electronically Signed   By: David  Martinique   On: 02/26/2014 12:37    CBC  Recent Labs Lab 02/26/14 1205 02/26/14 2043  WBC 6.4 5.4  HGB 12.2 12.3  HCT 38.6 39.5  PLT 225 225  MCV 86.5 86.1  MCH 27.4 26.8  MCHC 31.6 31.1  RDW 17.7* 17.9*  LYMPHSABS 0.4*  --   MONOABS 0.2  --   EOSABS 0.0  --   BASOSABS 0.0  --     Chemistries   Recent Labs Lab 02/26/14 1205 02/26/14 2043  NA 143  --   K 3.2*  --   CL 99  --   CO2 28  --   GLUCOSE 143*  --   BUN 9  --   CREATININE 0.80  0.72  CALCIUM 9.1  --    ------------------------------------------------------------------------------------------------------------------  estimated creatinine clearance is 96.3 ml/min (by C-G formula based on Cr of 0.72). ------------------------------------------------------------------------------------------------------------------ No results found for this basename: HGBA1C,  in the last 72 hours ------------------------------------------------------------------------------------------------------------------ No results found for this basename: CHOL, HDL, LDLCALC, TRIG, CHOLHDL, LDLDIRECT,  in the last 72 hours ------------------------------------------------------------------------------------------------------------------ No results found for this basename: TSH, T4TOTAL, FREET3, T3FREE, THYROIDAB,  in the last 72 hours ------------------------------------------------------------------------------------------------------------------ No results found for this basename: VITAMINB12, FOLATE, FERRITIN, TIBC, IRON, RETICCTPCT,  in the last 72 hours  Coagulation profile No results found for this basename: INR, PROTIME,  in the last 168 hours  No results found for this basename: DDIMER,  in the last 72 hours  Cardiac Enzymes  Recent Labs Lab 02/26/14 1205  TROPONINI <0.30   ------------------------------------------------------------------------------------------------------------------ No components found with this basename: POCBNP,      Time Spent in minutes   35   Lala Lund K M.D on 02/27/2014 at 10:53 AM  Between 7am to 7pm - Pager - [phone]  After 7pm go to [email or web address] - password [email or web address]  And look for the night coverage person covering for me after hours  [facility] Office  [phone]   **Disclaimer: This note may have been dictated with voice recognition software. Similar sounding words can inadvertently be transcribed and this note may  contain transcription errors which may not have been corrected upon publication of note.**

## 2014-02-28 DIAGNOSIS — K219 Gastro-esophageal reflux disease without esophagitis: Secondary | ICD-10-CM

## 2014-02-28 DIAGNOSIS — R0902 Hypoxemia: Secondary | ICD-10-CM

## 2014-02-28 DIAGNOSIS — E0789 Other specified disorders of thyroid: Secondary | ICD-10-CM

## 2014-02-28 DIAGNOSIS — E038 Other specified hypothyroidism: Secondary | ICD-10-CM

## 2014-02-28 DIAGNOSIS — J9620 Acute and chronic respiratory failure, unspecified whether with hypoxia or hypercapnia: Secondary | ICD-10-CM

## 2014-02-28 LAB — POTASSIUM: POTASSIUM: 5.3 meq/L (ref 3.7–5.3)

## 2014-02-28 MED ORDER — IPRATROPIUM-ALBUTEROL 0.5-2.5 (3) MG/3ML IN SOLN: 3.0000 mL | Freq: Four times a day (QID) | RESPIRATORY_TRACT | Status: AC | PRN

## 2014-02-28 MED ORDER — ALBUTEROL SULFATE HFA 108 (90 BASE) MCG/ACT IN AERS: 2.0000 | INHALATION_SPRAY | RESPIRATORY_TRACT | Status: DC | PRN

## 2014-02-28 MED ORDER — ALBUTEROL SULFATE (2.5 MG/3ML) 0.083% IN NEBU: 2.5000 mg | INHALATION_SOLUTION | RESPIRATORY_TRACT | Status: DC | PRN

## 2014-02-28 MED ORDER — PREDNISONE 10 MG PO TABS: ORAL_TABLET | ORAL | Status: DC

## 2014-02-28 NOTE — Progress Notes (Signed)
AVS discharge instructions were given and went over with patient. Patient was also given prescriptions for albuterol inhaler and nebulizer treatments, duoneb nebulizer treatments, and prednisone. Went over with patient that prednisone is titrated doses. Patient stated that she did not have any questions. Now patient is waiting for her family to pick her up. Staff will assist patient to her transportation.

## 2014-02-28 NOTE — Discharge Summary (Signed)
PATIENT DETAILS Name: Debra [name] Age: 43 y.o. Sex: female Date of Birth: 12-16-1970 MRN: [id number]. Admit Date: 02/26/2014 Admitting Physician: Velvet Bathe, MD WUJ:[id number],NWGNFAO M, MD  Recommendations for Outpatient Follow-up:  1. Optimize COPD medications 2. General Health Maintainance  PRIMARY DISCHARGE DIAGNOSIS:  Principal Problem:   Acute-on-chronic respiratory failure Active Problems:   GERD (gastroesophageal reflux disease)   Hypothyroidism   COPD exacerbation   Hypokalemia      PAST MEDICAL HISTORY: Past Medical History  Diagnosis Date  . Asthma   . Ulcerative colitis   . Anxiety   . Depression   . Chronic back pain   . Chronic knee pain   . Thyroid disease     hypo  . GERD (gastroesophageal reflux disease)   . Insomnia   . Allergic rhinitis   . Arthritis   . COPD (chronic obstructive pulmonary disease)     DISCHARGE MEDICATIONS:   Medication List         albuterol 108 (90 BASE) MCG/ACT inhaler  Commonly known as:  PROAIR HFA  Inhale 2 puffs into the lungs every 4 (four) hours as needed for wheezing or shortness of breath.     albuterol (2.5 MG/3ML) 0.083% nebulizer solution  Commonly known as:  PROVENTIL  Take 3 mLs (2.5 mg total) by nebulization every 4 (four) hours as needed for wheezing.     ALPRAZolam 0.5 MG tablet  Commonly known as:  XANAX  Take 0.5 mg by mouth 2 (two) times daily.     buPROPion 300 MG 24 hr tablet  Commonly known as:  WELLBUTRIN XL  Take 300 mg by mouth every morning.     citalopram 40 MG tablet  Commonly known as:  CELEXA  Take 40 mg by mouth every morning.     Fluticasone-Salmeterol 250-50 MCG/DOSE Aepb  Commonly known as:  ADVAIR DISKUS  Inhale 1 puff into the lungs 2 (two) times daily.     furosemide 40 MG tablet  Commonly known as:  LASIX  Take 40 mg by mouth daily.     ipratropium-albuterol 0.5-2.5 (3) MG/3ML Soln  Commonly known as:  DUONEB  Take 3 mLs by nebulization every 6 (six) hours  as needed (for wheezing. Take three times daily for  next 3 days).     levothyroxine 150 MCG tablet  Commonly known as:  SYNTHROID, LEVOTHROID  Take 150 mcg by mouth daily before breakfast.     montelukast 10 MG tablet  Commonly known as:  SINGULAIR  Take 10 mg by mouth at bedtime.     morphine 15 MG tablet  Commonly known as:  MSIR  Take 15 mg by mouth daily as needed for severe pain.     morphine 30 MG 12 hr tablet  Commonly known as:  MS CONTIN  Take 1 tablet (30 mg total) by mouth 2 (two) times daily.     MUCINEX DM 30-600 MG Tb12  Take 2 tablets by mouth every 6 (six) hours as needed (for cough/congestion).     omeprazole 40 MG capsule  Commonly known as:  PRILOSEC  Take 40 mg by mouth every morning.     predniSONE 10 MG tablet  Commonly known as:  DELTASONE  - Take 4 tablets (40 mg) daily for 2 days, then,  - Take 3 tablets (30 mg) daily for 2 days, then,  - Take 2 tablets (20 mg) daily for 2 days, then,  - Take 1 tablets (10 mg) daily for 1 days,  then stop     traZODone 100 MG tablet  Commonly known as:  DESYREL  Take 100 mg by mouth at bedtime as needed for sleep.        ALLERGIES:   Allergies  Allergen Reactions  . Penicillins Swelling and Rash    BRIEF HPI:  See H&P, Labs, Consult and Test reports for all details in brief, is a 43 y.o. female, with past medical history significant for asthma and COPD presented with few days history of shortness of breath, cough with greenish sputum. She was admitted for treatment of presumed COPD.  CONSULTATIONS:   None  PERTINENT RADIOLOGIC STUDIES: Dg Lumbar Spine Complete  02/09/2014   CLINICAL DATA:  Low back pain after moving heavy furniture.  EXAM: LUMBAR SPINE - COMPLETE 4+ VIEW  COMPARISON:  CT abdomen and pelvis 12/09/2013  FINDINGS: There is no evidence of lumbar spine fracture. Alignment is normal. Intervertebral disc spaces are maintained.  IMPRESSION: Negative.   Electronically Signed   By: Lucienne Capers M.D.   On: 02/09/2014 05:45   Dg Chest Portable 1 View  02/26/2014   CLINICAL DATA:  Shortness of breath with congestion and history of recent pneumonia ; history of COPD  EXAM: PORTABLE CHEST - 1 VIEW  COMPARISON:  PA and lateral chest x-ray of Dec 28, 2013  FINDINGS: The lungs are well-expanded. There is no focal infiltrate. There is no pleural effusion or pneumothorax. The cardiac silhouette is top-normal in size. The central pulmonary vascularity is mildly prominent and has become more conspicuous since the previous study. The bony thorax is unremarkable.  IMPRESSION: COPD with mild central pulmonary vascular prominence which may reflect low-grade compensated CHF. There is no focal pneumonia.   Electronically Signed   By: David  Martinique   On: 02/26/2014 12:37     PERTINENT LAB RESULTS: CBC:  Recent Labs  02/26/14 1205 02/26/14 2043  WBC 6.4 5.4  HGB 12.2 12.3  HCT 38.6 39.5  PLT 225 225   CMET CMP     Component Value Date/Time   NA 143 02/26/2014 1205   K 5.3 02/28/2014 0505   CL 99 02/26/2014 1205   CO2 28 02/26/2014 1205   GLUCOSE 143* 02/26/2014 1205   BUN 9 02/26/2014 1205   CREATININE 0.72 02/26/2014 2043   CALCIUM 9.1 02/26/2014 1205   PROT 6.7 12/11/2013 0445   ALBUMIN 3.0* 12/11/2013 0445   AST 8 12/11/2013 0445   ALT 6 12/11/2013 0445   ALKPHOS 48 12/11/2013 0445   BILITOT 0.2* 12/11/2013 0445   GFRNONAA >90 02/26/2014 2043   GFRAA >90 02/26/2014 2043    GFR Estimated Creatinine Clearance: 96.3 ml/min (by C-G formula based on Cr of 0.72). No results found for this basename: LIPASE, AMYLASE,  in the last 72 hours  Recent Labs  02/26/14 1205  TROPONINI <0.30   No components found with this basename: POCBNP,  No results found for this basename: DDIMER,  in the last 72 hours No results found for this basename: HGBA1C,  in the last 72 hours No results found for this basename: CHOL, HDL, LDLCALC, TRIG, CHOLHDL, LDLDIRECT,  in the last 72 hours No results found for  this basename: TSH, T4TOTAL, FREET3, T3FREE, THYROIDAB,  in the last 72 hours No results found for this basename: VITAMINB12, FOLATE, FERRITIN, TIBC, IRON, RETICCTPCT,  in the last 72 hours Coags: No results found for this basename: PT, INR,  in the last 72 hours Microbiology: Recent Results (from the past  240 hour(s))  CULTURE, BLOOD (ROUTINE X 2)     Status: None   Collection Time    02/26/14  2:20 PM      Result Value Ref Range Status   Specimen Description BLOOD RIGHT HAND   Final   Special Requests BOTTLES DRAWN AEROBIC AND ANAEROBIC [facility] BOTH   Final   Culture  Setup Time     Final   Value: 02/26/2014 19:35     Performed at [facility]   Culture     Final   Value:        BLOOD CULTURE RECEIVED NO GROWTH TO DATE CULTURE WILL BE HELD FOR 5 DAYS BEFORE ISSUING A FINAL NEGATIVE REPORT     Performed at [facility]   Report Status PENDING   Incomplete  CULTURE, BLOOD (ROUTINE X 2)     Status: None   Collection Time    02/26/14  2:25 PM      Result Value Ref Range Status   Specimen Description BLOOD LEFT HAND   Final   Special Requests BOTTLES DRAWN AEROBIC AND ANAEROBIC [facility] BOTH   Final   Culture  Setup Time     Final   Value: 02/26/2014 19:35     Performed at [facility]   Culture     Final   Value:        BLOOD CULTURE RECEIVED NO GROWTH TO DATE CULTURE WILL BE HELD FOR 5 DAYS BEFORE ISSUING A FINAL NEGATIVE REPORT     Performed at [facility]   Report Status PENDING   Incomplete  MRSA PCR SCREENING     Status: None   Collection Time    02/26/14  6:03 PM      Result Value Ref Range Status   MRSA by PCR NEGATIVE  NEGATIVE Final   Comment:            The GeneXpert MRSA Assay (FDA     approved for NASAL specimens     only), is one component of a     comprehensive MRSA colonization     surveillance program. It is not     intended to diagnose MRSA     infection nor to guide or     monitor treatment for     MRSA infections.  CULTURE,  EXPECTORATED SPUTUM-ASSESSMENT     Status: None   Collection Time    02/27/14  8:14 AM      Result Value Ref Range Status   Specimen Description SPUTUM   Final   Special Requests NONE   Final   Sputum evaluation     Final   Value: THIS SPECIMEN IS ACCEPTABLE. RESPIRATORY CULTURE REPORT TO FOLLOW.   Report Status 02/27/2014 FINAL   Final     BRIEF HOSPITAL COURSE:   Principal Problem:   Acute-on-chronic respiratory failure -secondary to COPD exacerbatio, possibly mild Acute diastolic heart failure. On home O2 at night -admitted, started on IV Solumedrol, scheduled nebs, empiric Levaquin. Significantly improved by the day of discharge. This am, claims that she is back to her baseline, lungs are clear during my exam, with very good air entry bilaterally. She claims to have ambulated in the room, without much difficulty. She is requesting discharge, since clinically improved, she is stable for discharge, with close follow up with her PCP.  Active Problems: COPD exacerbation -as above.  -This am, lungs are clear during my exam, with very good air entry bilaterally. She claims  to have ambulated in the room, without much difficulty. -will discharge on tapering prednisone, nebs and inhaler regimen as noted above  Possible Acute Diastolic heart failure -CXR on admission, suggested mild vascular congestion, treated with IV lasix.  -clinically compensated at the time of discharge, resume Lasix.    GERD (gastroesophageal reflux disease) -c/w PPI    Hypothyroidism -c/w Levothyroxine  TODAY-DAY OF DISCHARGE:  Subjective:   Clotine Heiner today has no headache,no chest abdominal pain,no new weakness tingling or numbness, feels much better wants to go home today.   Objective:   Blood pressure 103/65, pulse 51, temperature 97.7 F (36.5 C), temperature source Oral, resp. rate 18, height 5\' 2"  (1.575 m), weight 94.802 kg (209 lb), SpO2 98.00%.  Intake/Output Summary (Last 24 hours) at  02/28/14 0939 Last data filed at 02/28/14 0500  Gross per 24 hour  Intake   1024 ml  Output    800 ml  Net    224 ml   Filed Weights   02/26/14 1141  Weight: 94.802 kg (209 lb)    Exam Awake Alert, Oriented *3, No new F.N deficits, Normal affect [place].AT,PERRAL Supple Neck,No JVD, No cervical lymphadenopathy appriciated.  Symmetrical Chest wall movement, Good air movement bilaterally, CTAB RRR,No Gallops,Rubs or new Murmurs, No Parasternal Heave +ve B.Sounds, Abd Soft, Non tender, No organomegaly appriciated, No rebound -guarding or rigidity. No Cyanosis, Clubbing or edema, No new Rash or bruise  DISCHARGE CONDITION: Stable  DISPOSITION: Home  DISCHARGE INSTRUCTIONS:    Activity:  As tolerated  Diet recommendation: Heart Healthy diet      Discharge Instructions   Diet - low sodium heart healthy    Complete by:  As directed      Increase activity slowly    Complete by:  As directed            Follow-up Information   Follow up with Guadlupe Spanish, MD. Schedule an appointment as soon as possible for a visit in 1 week.   Specialty:  Internal Medicine   Contact information:   [address] [place] [address] [phone]       Follow up with [facility], NP. Schedule an appointment as soon as possible for a visit in 1 week.   Specialty:  Nurse Practitioner   Contact information:   [address] [address] [phone]       Total Time spent on discharge equals 45 minutes.  SignedOren Binet 02/28/2014 9:39 AM  **Disclaimer: This note may have been dictated with voice recognition software. Similar sounding words can inadvertently be transcribed and this note may contain transcription errors which may not have been corrected upon publication of note.**

## 2014-03-02 LAB — CULTURE, RESPIRATORY: CULTURE: NORMAL

## 2014-03-02 LAB — CULTURE, RESPIRATORY W GRAM STAIN

## 2014-03-04 LAB — CULTURE, BLOOD (ROUTINE X 2)
CULTURE: NO GROWTH
Culture: NO GROWTH

## 2014-05-13 IMAGING — CR DG CHEST 2V
2 series · 2 of 2 positions shown · non-contrast
Comparison: Chest radiograph performed 05/18/2013

CLINICAL DATA: Wheezing and bilateral lower extremity swelling.
History of asthma.

CHEST - 2 VIEW

[w chest pa]
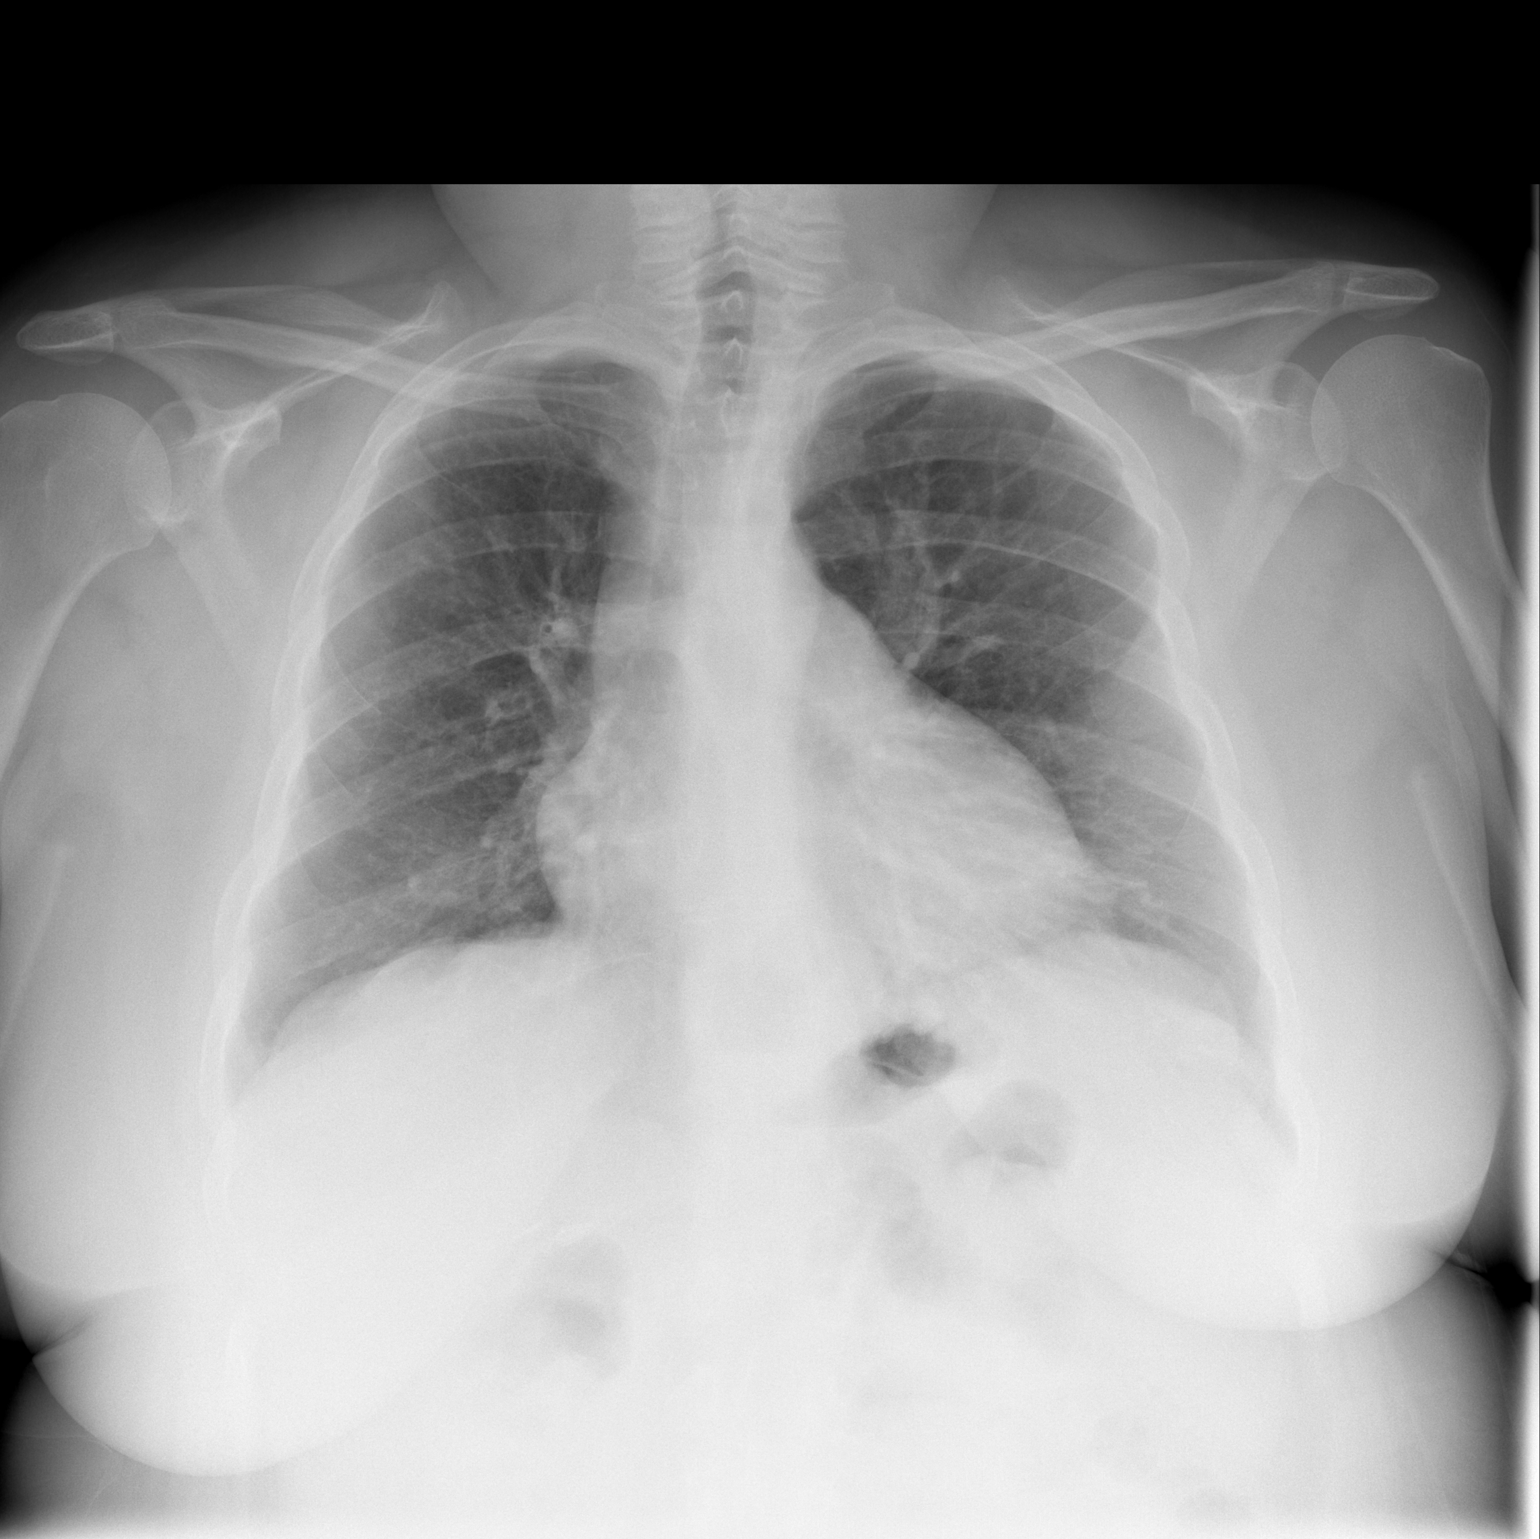

[w chest lat]
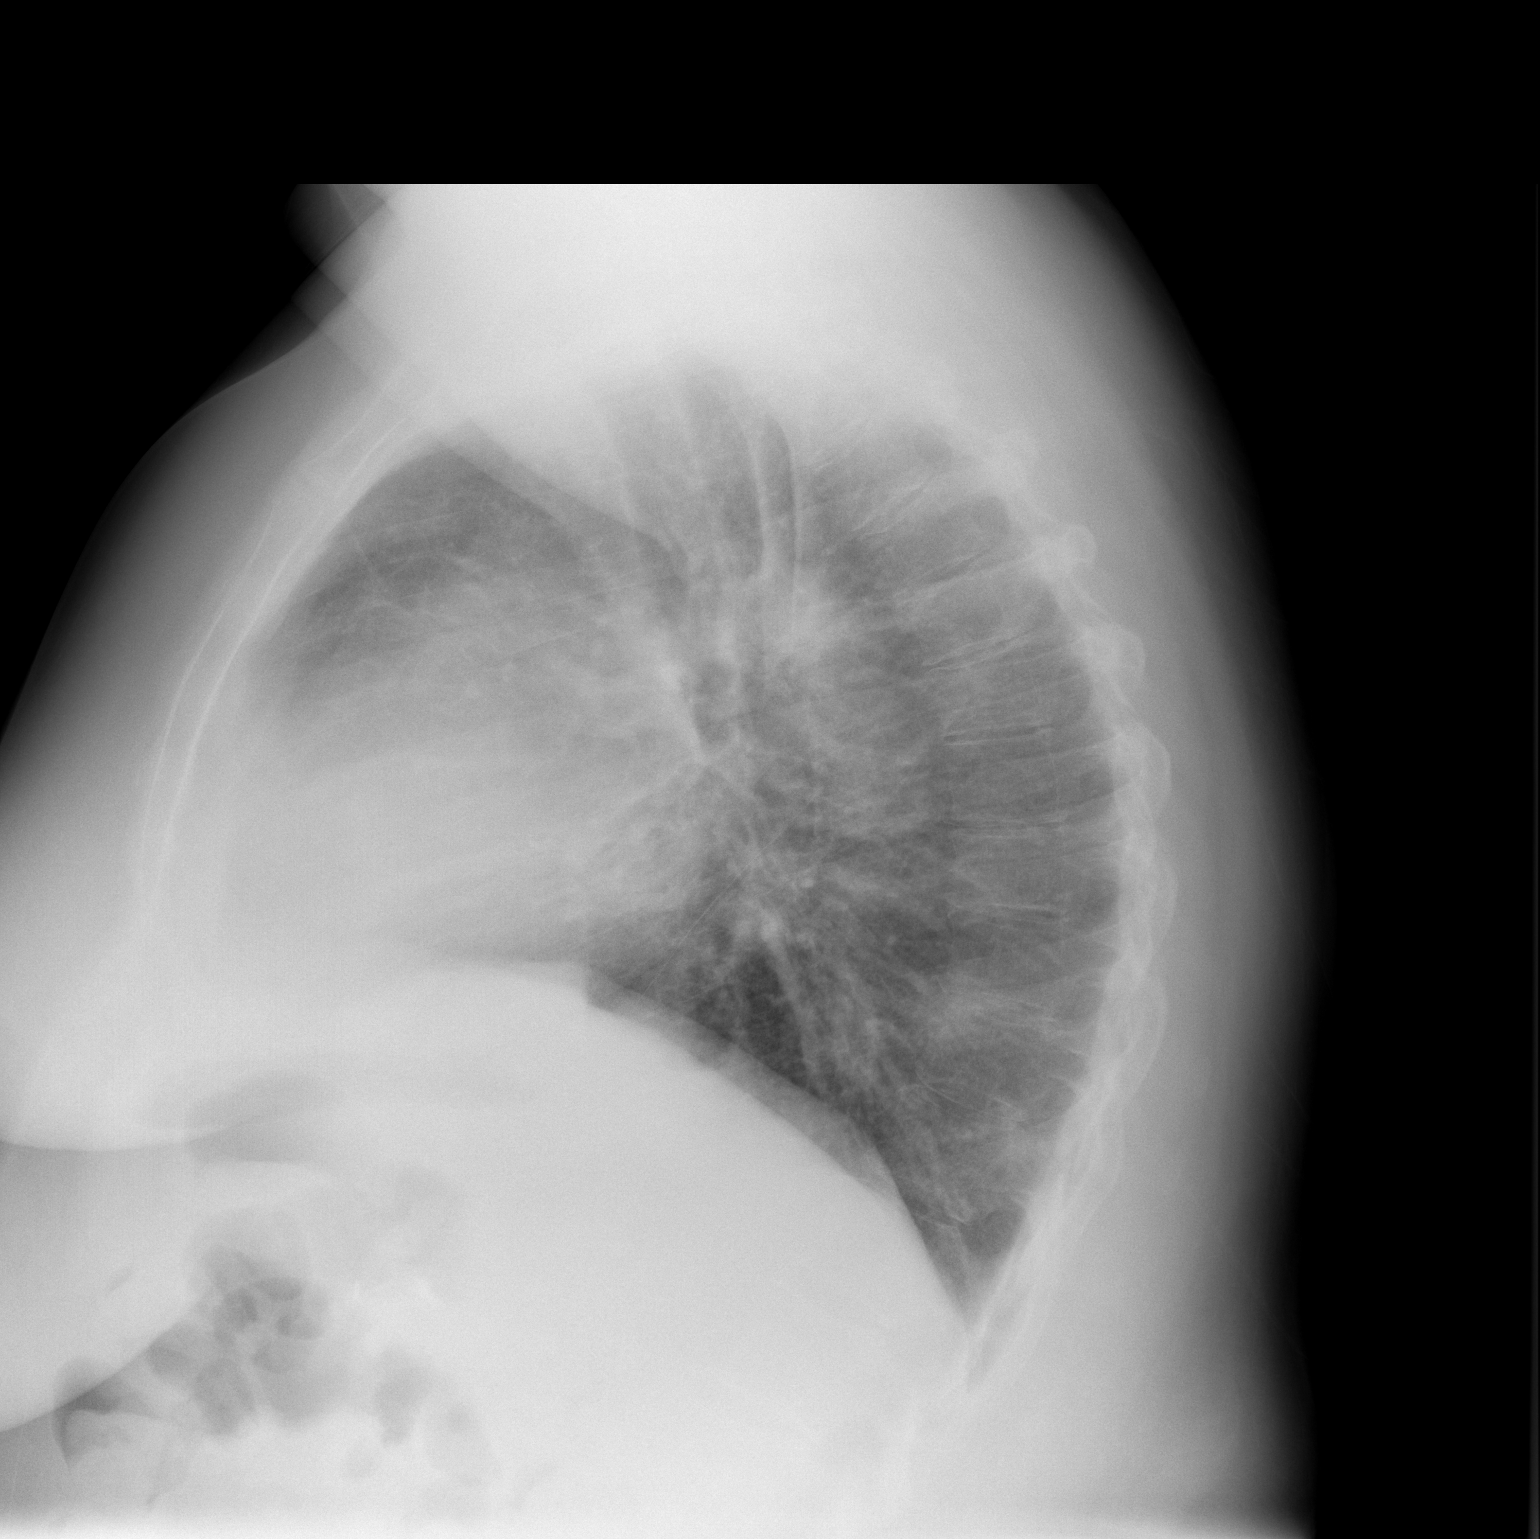

[2 of 2 positions shown; findings below may reference images not displayed]

FINDINGS: The lungs are relatively well-aerated.  Mild vascular
congestion is noted.  There is no evidence of focal opacification,
pleural effusion or pneumothorax.

The heart is normal in size; the mediastinal contour is within
normal limits.  No acute osseous abnormalities are seen.
IMPRESSION: Mild vascular congestion noted; lungs remain grossly clear.

## 2014-05-14 ENCOUNTER — Telehealth: Payer: Self-pay | Admitting: Internal Medicine

## 2014-05-14 NOTE — Telephone Encounter (Signed)
Called and LMTCB x1 

## 2014-05-14 NOTE — Telephone Encounter (Signed)
Debra [name] called back. Pt has upcoming appt with MW 05/20/14. She will also need surgical clearance. Pt has pelvic mass and will need to be removed, along with possibility of tubes/ovaries. This has been added to appt notes. Nothing further needed

## 2014-05-20 ENCOUNTER — Institutional Professional Consult (permissible substitution): Payer: Self-pay | Admitting: Internal Medicine

## 2014-10-01 ENCOUNTER — Telehealth: Payer: Self-pay | Admitting: Oncology

## 2014-10-01 NOTE — Telephone Encounter (Signed)
Left pt vm in ref to np appt. On 10-18-14@2 :30

## 2014-10-18 ENCOUNTER — Encounter: Payer: Self-pay | Admitting: Oncology

## 2014-10-18 ENCOUNTER — Ambulatory Visit (HOSPITAL_BASED_OUTPATIENT_CLINIC_OR_DEPARTMENT_OTHER): Payer: Medicaid Other | Admitting: Oncology

## 2014-10-18 ENCOUNTER — Other Ambulatory Visit (HOSPITAL_BASED_OUTPATIENT_CLINIC_OR_DEPARTMENT_OTHER): Payer: Medicaid Other

## 2014-10-18 ENCOUNTER — Ambulatory Visit: Payer: Medicaid Other

## 2014-10-18 ENCOUNTER — Encounter (INDEPENDENT_AMBULATORY_CARE_PROVIDER_SITE_OTHER): Payer: Self-pay

## 2014-10-18 ENCOUNTER — Other Ambulatory Visit: Payer: Self-pay | Admitting: Oncology

## 2014-10-18 VITALS — BP 134/83 | HR 81 | Temp 98.5°F | Resp 18 | Ht 62.0 in | Wt 220.1 lb

## 2014-10-18 DIAGNOSIS — C562 Malignant neoplasm of left ovary: Secondary | ICD-10-CM

## 2014-10-18 DIAGNOSIS — E039 Hypothyroidism, unspecified: Secondary | ICD-10-CM

## 2014-10-18 DIAGNOSIS — C563 Malignant neoplasm of bilateral ovaries: Secondary | ICD-10-CM

## 2014-10-18 DIAGNOSIS — C569 Malignant neoplasm of unspecified ovary: Secondary | ICD-10-CM

## 2014-10-18 DIAGNOSIS — Z9049 Acquired absence of other specified parts of digestive tract: Secondary | ICD-10-CM

## 2014-10-18 DIAGNOSIS — J189 Pneumonia, unspecified organism: Secondary | ICD-10-CM

## 2014-10-18 DIAGNOSIS — C561 Malignant neoplasm of right ovary: Secondary | ICD-10-CM

## 2014-10-18 DIAGNOSIS — Z90710 Acquired absence of both cervix and uterus: Secondary | ICD-10-CM

## 2014-10-18 DIAGNOSIS — J4541 Moderate persistent asthma with (acute) exacerbation: Secondary | ICD-10-CM

## 2014-10-18 DIAGNOSIS — Z95828 Presence of other vascular implants and grafts: Secondary | ICD-10-CM

## 2014-10-18 DIAGNOSIS — G8921 Chronic pain due to trauma: Secondary | ICD-10-CM

## 2014-10-18 DIAGNOSIS — J441 Chronic obstructive pulmonary disease with (acute) exacerbation: Secondary | ICD-10-CM

## 2014-10-18 LAB — COMPREHENSIVE METABOLIC PANEL (CC13)
ALT: 9 U/L (ref 0–55)
AST: 10 U/L (ref 5–34)
Albumin: 3.4 g/dL — ABNORMAL LOW (ref 3.5–5.0)
Alkaline Phosphatase: 74 U/L (ref 40–150)
Anion Gap: 12 mEq/L — ABNORMAL HIGH (ref 3–11)
BUN: 11.4 mg/dL (ref 7.0–26.0)
CHLORIDE: 103 meq/L (ref 98–109)
CO2: 25 mEq/L (ref 22–29)
Calcium: 9.1 mg/dL (ref 8.4–10.4)
Creatinine: 0.9 mg/dL (ref 0.6–1.1)
EGFR: 73 mL/min/{1.73_m2} — ABNORMAL LOW (ref >90)
Glucose: 115 mg/dl (ref 70–140)
Potassium: 4 mEq/L (ref 3.5–5.1)
Sodium: 140 mEq/L (ref 136–145)
TOTAL PROTEIN: 7.3 g/dL (ref 6.4–8.3)
Total Bilirubin: 0.64 mg/dL (ref 0.20–1.20)

## 2014-10-18 LAB — CBC WITH DIFFERENTIAL/PLATELET
BASO%: 0.3 % (ref 0.0–2.0)
Basophils Absolute: 0 10*3/uL (ref 0.0–0.1)
EOS ABS: 0.1 10*3/uL (ref 0.0–0.5)
EOS%: 0.5 % (ref 0.0–7.0)
HCT: 34.4 % — ABNORMAL LOW (ref 34.8–46.6)
HEMOGLOBIN: 10.6 g/dL — AB (ref 11.6–15.9)
LYMPH%: 9.2 % — ABNORMAL LOW (ref 14.0–49.7)
MCH: 28.1 pg (ref 25.1–34.0)
MCHC: 30.8 g/dL — AB (ref 31.5–36.0)
MCV: 91.2 fL (ref 79.5–101.0)
MONO#: 0.4 10*3/uL (ref 0.1–0.9)
MONO%: 4.3 % (ref 0.0–14.0)
NEUT#: 7.8 10*3/uL — ABNORMAL HIGH (ref 1.5–6.5)
NEUT%: 85.7 % — ABNORMAL HIGH (ref 38.4–76.8)
Platelets: 274 10*3/uL (ref 145–400)
RBC: 3.77 10*6/uL (ref 3.70–5.45)
RDW: 22 % — AB (ref 11.2–14.5)
WBC: 9.1 10*3/uL (ref 3.9–10.3)
lymph#: 0.8 10*3/uL — ABNORMAL LOW (ref 0.9–3.3)

## 2014-10-18 NOTE — Progress Notes (Signed)
[facility] NEW PATIENT EVALUATION   Name: Pearlina Friedly Date: October 18, 2014  MRN: [id number] DOB: 09/05/1970  REFERRING PHYSICIAN: [name] cc Guadlupe Spanish, MD(PCP); Marcy Panning (pulmonary, [phone]); Virgel Bouquet (GI, [employer])  Multiple records from [facility]/ Dr [name], and some information from pulmonary physician received and reviewed in detail for this consultation. Records will be scanned into this EMR. Chemo flow sheets were not included and will be requested; operative note from 06-03-14 not included; partial records from recent hospitalizations are included. I believe that most recent hospital admission, for respiratory indications, was at [facility] 09-04-13 x 9 days, during which time she was intubated x 4-5 days.   Her very complicated history has been reviewed in detail with patient and mother, as accurately as I am able to obtain it.    REASON FOR REFERRAL: second opinion re adjuvant chemotherapy for ovarian carcinoma   HISTORY OF PRESENT ILLNESS:Debra [name] is a 44 y.o. 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   Medical situation otherwise is remarkable for a history of asthma since childhood and COPD, with multiple admissions for pneumonia prior to starting chemo, reportedly admissions at least x12 in the year prior to starting chemo. She has now established with Dr [name] for pulmonary. She has chronic pain issues since she was run over by tractor when young, with fractures of pelvis and vertebrae; she is followed by PCP under pain management contract for this.   Patient presented with >= 1 year of abdominal pain, which she recalls was evaluated variously during that time at EDs [facility] and [facility], including CTs.  From this EMR, she was seen in [facility] system ED 09-2013, 10-2013, admitted 11-2013, ED 01-2014, and admitted 02-2014. Admissions were for respiratory indications.  She had CTs AP 10-2013 and 11-2013 in [facility] system.  Patient was post vaginal hysterectomy ~ 2005 for cervical dysplasia. In 05-2014 she was found to have gyn abnormalities on CT, referred to Dr [name]. She had pulmonary evaluation by Dr [name] prior to surgery, then exploratory laparotomy 06-03-14 (I believe at [facility]) with BSO, tumor debulking and right pelvic node evaluation. Pathology [facility] [phone] from 06-03-14) bilateral ovaries and fallopian tubes with high grade serous carcinoma with clear cell changes, with tumor entirely replacing left ovary 12.1 cm maximum diameter and on right also extending into paraovarian soft tissue, extensive angiolymphatic space involvement, as well as involvement of sigmoid mesentery, posterior cul de sac, right pelvic sidewall, omentum, 6 of 6 nodes involved,  and malignant ascites ([id number], that report not included other than mentioned in surgical path). Baseline CA 125 not found in this information.  She was hospitalized again Nov 8-14, 2015 at [facility], with hospital problem list of pelvic abscess, COPD, nausea vomiting and chronic pain; per patient, she had drain during this hospitalization (?) for question of pelvic abscess vs lymphocoele. She had colonoscopy 07-21-15, apparently not remarkable (that report not available to me). She received first chemotherapy with weekly taxol carboplatin on 08-05-14, per patient chemotherapy given in [place] location, possibly carboplatin on 12-17 and taxol x 3 afterwards. She was admitted 08-23-14 with respiratory symptoms and possible gastritis/ colitis, with ANC nadir 0.41 during that hospitalization. She received cycle 2 taxol carboplatin 09-02-14, then was again admitted to [facility]  09-04-14 with acute respiratory failure, requiring several days  on ventilator in ICU. She was not neutropenic during that hospitalization; I am not clear if she had gCSF support. Patient does not know if she had repeat CTs during the January hospitalizations. Patient and family do not believe that she has had bronchoscopy. She has PAC. LE doppler negative for DVT ~ early Dec 2015.  Patient had not had follow up with pulmonary since the hospitalization by time of visit to Dr [name] 09-22-14, tho since then has gotten active Medicaid. She saw Dr [name] on 10-08-14, with next appointment in 3 weeks; she understands that she still had right sided pneumonia at the recent visit and continues antibiotics and tapering steroids. Sputum is now light yellow and respiratory symptoms overall are improving. She reports "occasional fever" ongoing, has had home O2 since January hospitalization and now uses this mostly at hs. She is using home nebulizer 3x daily and has other inhalers.  At visit with Dr [name] 09-22-14, pulmonary situation was not stable for resuming chemotherapy, tho further delay in the chemotherapy was understandably of concern due to high risk features of this ovarian cancer as noted. Dr [name] encouraged follow up with pulmonary physician and offered second opinion including at tertiary care institution, which patient and mother declined then. Second opinion consultation was subsequently requested at this office on 09-29-2014.   REVIEW OF SYSTEMS as above, also: Occasional HA, chronic and unchanged. Wears glasses. No difficulty hearing. No recent dental cleaning or evaluation, some sensitivity, no acute pain. Thyroid disease since age 63, managed by Dr Holley Raring. Denies SOB at time of this visit. No cardiac problems. Has had mammograms once. Usual weight ~ 215 lbs. GERD very symptomatic. Constipated in January, now bowels moving regularly. Arthritis mostly bothersome left knee. No bleeding. Hot flashes prior to BSO and since BSO. No blood clots. No peripheral neuropathy  symptoms. No reported severe aches with taxol.  Remainder of full 10 point review of systems negative.   ALLERGIES: Penicillins  PAST MEDICAL/ SURGICAL HISTORY:    G6P1, C section Asthma since childhood "run over by tractor" in childhood with fractured pelvis and vertebrae, chronic pain since early 10-27-22. 3 knee surgeries, bilateral "ulcerative colitis" 10-27-98 Hypothyroid since 27-Oct-2022 Laparoscopic cholecystectomy Oct 27, 1998 Vaginal hysterectomy for cervical dysplasia ~ 10/28/03 Colonoscopy by Dr Virgel Bouquet 2011/10/28 and by Dr Gordy Clement [phone]. Mammograms age 79  CURRENT MEDICATIONS: reviewed as listed now in EMR  PHARMACY   SOCIAL HISTORY:  From [place], single, lives with mother and daughter. Incarcerated thru Oct 27, 2006 for drugs. Smoked x 3 years, DCd 2013. Denies ETOH or nonprescription drugs now; mother assists with pain medication from PCP. Worked as Engineer, production.   FAMILY HISTORY:  Mother with ovarian cancer age 68 (?) and invasive ductal breast Oct 27, 1998 Maternal first cousin died of colon cancer 85s Brother with thyroid disease          PHYSICAL EXAM:  height is 5' 2"  (1.575 m) and weight is 220 lb 1.6 oz (99.837 kg). Her oral temperature is 98.5 F (36.9 C). Her blood pressure is 134/83 and her pulse is 81. Her respiration is 18.  Alert, pleasant, cooperative lady, appears stated age, respirations not labored RA, ambulatory without assistance. Mother and daughter very supportive.  HEENT: PERRL, not icteric. Oral mucosa moist and clear, poor dentition tho no overtly infected or abscessed areas. Neck supple without JVD.  RESPIRATORY: BS heard bilaterally tho somewhat diminished thruout,  with crackles right lower chest posteriorly, no wheezes,  no dullness to percussion including right lower chest, no use of accessory muscles.   CARDIAC/ VASCULAR: heart RRR without gallop, clear heart sounds. Peripheral pulses symmetrical and intact  ABDOMEN: obese, soft, nontender. Surgical incision  closed, nontender. Cannot appreciate HSM or mass. Bowel sounds normally active.  LYMPH NODES:no cervical, supraclavicular, axillary or inguinal adenopathy  BREASTS:bilaterally without dominant mass, skin or nipple findings  NEUROLOGIC: CN, motor, sensory, cerebellar without focal deficits. PSYCH appropriate mood and affect  SKIN: without rash, ecchymosis, petechiae  MUSCULOSKELETAL: back not tender to palpation. Extremities without clubbing, cyanosis, edema. No cords or tenderness LE.  PORTACATH nontender and without erythema    LABORATORY DATA:  Results for orders placed or performed in visit on 10/18/14 (from the past 48 hour(s))  CBC with Differential     Status: Abnormal   Collection Time: 10/18/14  2:34 PM  Result Value Ref Range   WBC 9.1 3.9 - 10.3 10e3/uL   NEUT# 7.8 (H) 1.5 - 6.5 10e3/uL   HGB 10.6 (L) 11.6 - 15.9 g/dL   HCT 34.4 (L) 34.8 - 46.6 %   Platelets 274 145 - 400 10e3/uL   MCV 91.2 79.5 - 101.0 fL   MCH 28.1 25.1 - 34.0 pg   MCHC 30.8 (L) 31.5 - 36.0 g/dL   RBC 3.77 3.70 - 5.45 10e6/uL   RDW 22.0 (H) 11.2 - 14.5 %   lymph# 0.8 (L) 0.9 - 3.3 10e3/uL   MONO# 0.4 0.1 - 0.9 10e3/uL   Eosinophils Absolute 0.1 0.0 - 0.5 10e3/uL   Basophils Absolute 0.0 0.0 - 0.1 10e3/uL   NEUT% 85.7 (H) 38.4 - 76.8 %   LYMPH% 9.2 (L) 14.0 - 49.7 %   MONO% 4.3 0.0 - 14.0 %   EOS% 0.5 0.0 - 7.0 %   BASO% 0.3 0.0 - 2.0 %  Comprehensive metabolic panel (Cmet) - [facility]     Status: Abnormal   Collection Time: 10/18/14  2:34 PM  Result Value Ref Range   Sodium 140 136 - 145 mEq/L   Potassium 4.0 3.5 - 5.1 mEq/L   Chloride 103 98 - 109 mEq/L   CO2 25 22 - 29 mEq/L   Glucose 115 70 - 140 mg/dl   BUN 11.4 7.0 - 26.0 mg/dL   Creatinine 0.9 0.6 - 1.1 mg/dL   Total Bilirubin 0.64 0.20 - 1.20 mg/dL   Alkaline Phosphatase 74 40 - 150 U/L   AST 10 5 - 34 U/L   ALT 9 0 - 55 U/L   Total Protein 7.3 6.4 - 8.3 g/dL   Albumin 3.4 (L) 3.5 - 5.0 g/dL   Calcium 9.1 8.4 - 10.4 mg/dL   Anion  Gap 12 (H) 3 - 11 mEq/L   EGFR 73 (L) >90 ml/min/1.73 m2    Comment: eGFR is calculated using the CKD-EPI Creatinine Equation (2009)    CA 125 available after visit 17  PATHOLOGY:  Surgical path [facility]  [id number] from 06-03-2014 as above,  to be scanned into this EMR  RADIOGRAPHY: Reviewed in this EMR but not otherwise available at time of this consultation     DISCUSSION: Complicated history and course of present illness to date reviewed in detail with patient and family. Obviously everyone would have preferred if chemotherapy could have been given in timely fashion, however she has significant comorbidities and that has simply not been possible. She does hope to resume adjuvant treatment, but does not yet appear stable for that given ongong antibiotics and steroid taper  with PE findings right lower lung. She will try to see Dr [name] shortly after completes present antibiotics; I will try to discuss with him by phone also.  I have explained clearly to patient and family that I am a medical oncologist. I have told them that Dr [name] is an excellent and experienced gyn oncologist, and that I agree entirely with her concerns about risks vs benefits of additional chemotherapy. We have decided to get restaging CT CAP in addition to pulmonary follow up as above, particularly as she is now 5 months from surgery. I will see her back in ~ 10 days. I did not discuss chemo regimen, probably weekly dose dense carbo taxol due to better side effect profile, or gCSF support with her yet.   Patient is in agreement with genetics counseling, which I have requested.    IMPRESSION / PLAN:  1.IIIC high grade serous carcinoma with clear cell features involving bilateral ovaries at surgery 06-03-14: chemotherapy delayed and complicated by comorbid illness and other issues, with <= 2 cycles carbo taxol from 08-05-14 thru 09-02-14. Consider resuming dose dense weekly carbo taxol, with gCSF support, when pulmonary  situation allows and depending on restaging CTs pending. I will see her back in ~ 10 days to follow up 2.recurrent pneumonias:multiple hospitalizations in past 1-2 years,  intubated x several days at [facility] in [facility]. Dr [name] now following, back on antibiotics and steroid taper, symptoms improving but not resolved. Plan as above 3.asthma since childhood. Minimal past tobacco 4.PAC in 5.chronic pain since tractor accident in childhood: all pain medication by PCP under pain management contract 6.family history of ovarian and breast cancer in mother. Referral for genetics counseling 7.post laparoscopic cholecystectomy, vaginal hysterectomy for cervical indications, knee surgeries, C section 8.hypothyroid x ~ 20 years, on replacement 9.overdue dental exam 10. Reported ulcerative colitis 2000, tho I am not sure of that diagnosis 11.morbid obesity, BMI 40.3 12.long standing GERD. Colonoscopy done Dec 2015 13.past incarceration for drugs     Patient and accompanying individuals have had questions answered to their satisfaction and are in agreement with plan above. They can contact this office for questions or concerns at any time prior to next scheduled visit.  Time spent 90 min directly with patient, including >50% discussion and coordination of care, as well as additional time for MD review of outside records. Cc this note to Drs [name], Verl Dicker.   Charm Stenner P, MD 10/18/2014 9:03 PM

## 2014-10-18 NOTE — Progress Notes (Signed)
Checked in new pt.  Pt's ins should pay at 100% but she has mine and Raquel's card for any billing questions or concerns.

## 2014-10-19 LAB — CA 125: CA 125: 17 U/mL (ref <35)

## 2014-10-20 ENCOUNTER — Telehealth: Payer: Self-pay | Admitting: Oncology

## 2014-10-20 NOTE — Telephone Encounter (Signed)
S/w pt and she is aware of her 3/11 and 3/16 appts,also aware that she will be getting a call for her scan appts      Debra [name]

## 2014-10-24 ENCOUNTER — Encounter: Payer: Self-pay | Admitting: Oncology

## 2014-10-24 DIAGNOSIS — E039 Hypothyroidism, unspecified: Secondary | ICD-10-CM | POA: Insufficient documentation

## 2014-10-24 DIAGNOSIS — Z9049 Acquired absence of other specified parts of digestive tract: Secondary | ICD-10-CM | POA: Insufficient documentation

## 2014-10-24 DIAGNOSIS — G8921 Chronic pain due to trauma: Secondary | ICD-10-CM | POA: Insufficient documentation

## 2014-10-24 DIAGNOSIS — C563 Malignant neoplasm of bilateral ovaries: Secondary | ICD-10-CM | POA: Insufficient documentation

## 2014-10-24 DIAGNOSIS — C562 Malignant neoplasm of left ovary: Principal | ICD-10-CM

## 2014-10-24 DIAGNOSIS — Z90710 Acquired absence of both cervix and uterus: Secondary | ICD-10-CM | POA: Insufficient documentation

## 2014-10-24 DIAGNOSIS — Z95828 Presence of other vascular implants and grafts: Secondary | ICD-10-CM | POA: Insufficient documentation

## 2014-10-24 DIAGNOSIS — C561 Malignant neoplasm of right ovary: Secondary | ICD-10-CM | POA: Insufficient documentation

## 2014-10-24 DIAGNOSIS — J4540 Moderate persistent asthma, uncomplicated: Secondary | ICD-10-CM | POA: Insufficient documentation

## 2014-10-24 NOTE — Progress Notes (Signed)
Medical Oncology  Correct spelling for pulmonary physician is Dr Dionne Milo  L.Marko Plume, MD

## 2014-10-25 ENCOUNTER — Telehealth: Payer: Self-pay | Admitting: *Deleted

## 2014-10-25 NOTE — Telephone Encounter (Signed)
Received copy of chemotherapy flow sheet and MD visit from 09/22/14 from Dr. Shelba Flake office. Copy given to Dr. Marko Plume and original sent to HIM to be scanned.

## 2014-10-25 NOTE — Telephone Encounter (Signed)
-----   Message from Gordy Levan, MD sent at 10/24/2014  1:50 PM EST ----- Please request chemo flow sheets from Dr [name] with [facility]. Chemo was given at that office in [place] ~ 08-05-14, 09-02-14 and possibly other dates  I need the information for her visit on 10-29-14  Thank you

## 2014-10-26 ENCOUNTER — Telehealth: Payer: Self-pay | Admitting: Oncology

## 2014-10-26 NOTE — Telephone Encounter (Signed)
Medical Oncology  Spoke directly with Dr Dionne Milo with [facility] in [place].  He tells me that she has severe obstructive pulmonary findings on spirometry.He  does not know that she has had bronchoscopy or that she has needed bronchoscopy, tho might be consideration if localized area is problem with recurrent pneumonias. He is glad that we have CT CAP upcoming and I will send him those results. He does not think unreasonable even with her known pulmonary problems to try further careful chemotherapy, and he is glad to follow closely during chemo.  Godfrey Pick, MD

## 2014-10-27 ENCOUNTER — Ambulatory Visit (HOSPITAL_COMMUNITY): Payer: Medicaid Other

## 2014-10-28 ENCOUNTER — Telehealth: Payer: Self-pay | Admitting: *Deleted

## 2014-10-28 ENCOUNTER — Other Ambulatory Visit: Payer: Self-pay | Admitting: *Deleted

## 2014-10-28 DIAGNOSIS — C562 Malignant neoplasm of left ovary: Principal | ICD-10-CM

## 2014-10-28 DIAGNOSIS — C563 Malignant neoplasm of bilateral ovaries: Secondary | ICD-10-CM

## 2014-10-28 DIAGNOSIS — C561 Malignant neoplasm of right ovary: Secondary | ICD-10-CM

## 2014-10-28 NOTE — Telephone Encounter (Signed)
Patient rescheduled her CT scan to 11/04/14 as her mother is sick now and she cannot go the scan as scheduled. Dr. Marko Plume visit rescheduled to 11/05/14 so she will have CT scan results at visit. Patient called and notified of this and she is agreeable to see Dr. Marko Plume on 3/18 at 11:15am. Told patient to call us back if for any reason she cannot keep this appt - patient agreeable to this.

## 2014-10-29 ENCOUNTER — Other Ambulatory Visit: Payer: Medicaid Other

## 2014-10-29 ENCOUNTER — Ambulatory Visit (HOSPITAL_COMMUNITY): Payer: Medicaid Other

## 2014-10-29 ENCOUNTER — Ambulatory Visit: Payer: Medicaid Other | Admitting: Oncology

## 2014-11-03 ENCOUNTER — Encounter: Payer: Medicaid Other | Admitting: Genetic Counselor

## 2014-11-03 ENCOUNTER — Other Ambulatory Visit: Payer: Medicaid Other

## 2014-11-04 ENCOUNTER — Ambulatory Visit (HOSPITAL_COMMUNITY): Payer: Medicaid Other

## 2014-11-04 ENCOUNTER — Telehealth: Payer: Self-pay | Admitting: Oncology

## 2014-11-04 ENCOUNTER — Other Ambulatory Visit: Payer: Self-pay | Admitting: Oncology

## 2014-11-04 ENCOUNTER — Other Ambulatory Visit: Payer: Medicaid Other

## 2014-11-04 ENCOUNTER — Other Ambulatory Visit: Payer: Self-pay

## 2014-11-04 DIAGNOSIS — C561 Malignant neoplasm of right ovary: Secondary | ICD-10-CM

## 2014-11-04 DIAGNOSIS — C562 Malignant neoplasm of left ovary: Principal | ICD-10-CM

## 2014-11-04 DIAGNOSIS — C563 Malignant neoplasm of bilateral ovaries: Secondary | ICD-10-CM

## 2014-11-04 NOTE — Telephone Encounter (Signed)
Cancelled labs per 03/17 POF..... KJ

## 2014-11-04 NOTE — Progress Notes (Signed)
Patient's mom called to cancel lab appt scheduled for patient today at 9:00 am.  Reports patient having increased pain in chest and lungs and coughing up increased amounts of phlegm.  Patient scheduled to see Dr. Jaclynn Guarneri, [place] @ [facility] [phone]) in [place] this morning. States will call back to reschedule.

## 2014-11-05 ENCOUNTER — Ambulatory Visit: Payer: Medicaid Other | Admitting: Oncology

## 2014-11-05 ENCOUNTER — Telehealth: Payer: Self-pay | Admitting: *Deleted

## 2014-11-05 NOTE — Telephone Encounter (Signed)
Patient left VM to say she cannot keep the 11:15 am appointment today because she was seeing her pulmonologist.  He is supposed to fax Dr. Marko Plume notes about a "new diagnosis."

## 2014-11-08 ENCOUNTER — Ambulatory Visit (HOSPITAL_COMMUNITY): Payer: Medicaid Other

## 2014-11-10 ENCOUNTER — Other Ambulatory Visit: Payer: Self-pay | Admitting: *Deleted

## 2014-11-10 ENCOUNTER — Telehealth: Payer: Self-pay | Admitting: *Deleted

## 2014-11-10 DIAGNOSIS — C561 Malignant neoplasm of right ovary: Secondary | ICD-10-CM

## 2014-11-10 DIAGNOSIS — C562 Malignant neoplasm of left ovary: Principal | ICD-10-CM

## 2014-11-10 DIAGNOSIS — C563 Malignant neoplasm of bilateral ovaries: Secondary | ICD-10-CM

## 2014-11-10 NOTE — Telephone Encounter (Signed)
Called patient to followup about being unable to keep MD appt scheduled yesterday. Patient states she saw Dr. Michela Pitcher last week and was prescribed prednisone and levaquin and has been sick. She states she has a f/u appt with Dr. Michela Pitcher today at 4:30pm. She states Dr. Michela Pitcher ordered a chest CT but it has not been done yet. Told patient that Dr. Marko Plume had ordered CT chest, abdomen, pelvis so she would not to get chest CT ordered by Dr. Michela Pitcher  in addition to the scans ordered by Dr. Marko Plume. Patient states understanding. She states she will tell MD at visit this afternoon. Told patient to get Dr. Silvano Rusk office to fax Dr. Marko Plume a copy of his note after the visit - patient agreeable to this.   Patient agreeable to have CT scan at [facility]. Patient given number to scheduled CT scan as she has many scheduling conflicts. Pt scheduled CT scan for 11/23/14. Called patient and asked if she wanted to schedule it sooner - she states "I've gotten behind on things with being sick so I'll wait until April 5th to have the CT." Told patient that if she changes her mind and would like to have the scan sooner to call back and let us know. Patient agreeable to this.   Copy of Dr. Silvano Rusk note from 11/04/14 and 10/08/14 received - copy sent to HIM to be scanned into patient's chart and copy placed on Dr. Mariana Kaufman desk for review.

## 2014-11-11 ENCOUNTER — Other Ambulatory Visit: Payer: Self-pay | Admitting: Oncology

## 2014-11-11 DIAGNOSIS — C562 Malignant neoplasm of left ovary: Principal | ICD-10-CM

## 2014-11-11 DIAGNOSIS — C563 Malignant neoplasm of bilateral ovaries: Secondary | ICD-10-CM

## 2014-11-11 DIAGNOSIS — C561 Malignant neoplasm of right ovary: Secondary | ICD-10-CM

## 2014-11-12 ENCOUNTER — Telehealth: Payer: Self-pay | Admitting: Oncology

## 2014-11-12 NOTE — Telephone Encounter (Signed)
Spoke with Ms. Pupo and told her that the prior authorization for the CT scan runs out on 11-21-14 per our managed care.  The CT scan had to be rescheduled to 11-19-14 at 1030 at [facility] radiology.  She needs to arrive at 1015 at radiology to check in.  Ms. Skoda wrote appointment down and has contrast to drink and NPO 4 hrs prior to CT.

## 2014-11-12 NOTE — Telephone Encounter (Signed)
Spoke with patient and she is aware of her appointments and i have mailed her a calendar as well  Debra [name]

## 2014-11-15 ENCOUNTER — Telehealth: Payer: Self-pay | Admitting: Oncology

## 2014-11-15 NOTE — Telephone Encounter (Signed)
Changed time of 4/8 appointment to 1:30pm. Spoke with patient she is aware.

## 2014-11-19 ENCOUNTER — Encounter (HOSPITAL_COMMUNITY): Payer: Self-pay

## 2014-11-19 ENCOUNTER — Ambulatory Visit (HOSPITAL_COMMUNITY)
Admission: RE | Admit: 2014-11-19 | Discharge: 2014-11-19 | Disposition: A | Discharge status: Home / Self Care | Payer: Medicaid Other | Source: Ambulatory Visit | Attending: Oncology | Admitting: Oncology

## 2014-11-19 DIAGNOSIS — C563 Malignant neoplasm of bilateral ovaries: Secondary | ICD-10-CM

## 2014-11-19 DIAGNOSIS — C562 Malignant neoplasm of left ovary: Secondary | ICD-10-CM | POA: Insufficient documentation

## 2014-11-19 DIAGNOSIS — C561 Malignant neoplasm of right ovary: Secondary | ICD-10-CM | POA: Diagnosis not present

## 2014-11-19 MED ORDER — IOHEXOL 300 MG/ML  SOLN
100.0000 mL | Freq: Once | INTRAMUSCULAR | Status: AC | PRN
  Administered 2014-11-19: 100 mL via INTRAVENOUS

## 2014-11-22 ENCOUNTER — Encounter: Payer: Self-pay | Admitting: Genetic Counselor

## 2014-11-22 ENCOUNTER — Encounter: Payer: Medicaid Other | Admitting: Genetic Counselor

## 2014-11-22 ENCOUNTER — Other Ambulatory Visit: Payer: Medicaid Other

## 2014-11-22 DIAGNOSIS — C561 Malignant neoplasm of right ovary: Secondary | ICD-10-CM

## 2014-11-22 DIAGNOSIS — C562 Malignant neoplasm of left ovary: Principal | ICD-10-CM

## 2014-11-22 DIAGNOSIS — C563 Malignant neoplasm of bilateral ovaries: Secondary | ICD-10-CM

## 2014-11-22 NOTE — Progress Notes (Signed)
Patient did not show for her appointment.  Please re-refer if she is still interested in a genetics appointment.

## 2014-11-23 ENCOUNTER — Ambulatory Visit (HOSPITAL_COMMUNITY): Payer: Medicaid Other

## 2014-11-25 ENCOUNTER — Telehealth: Payer: Self-pay | Admitting: *Deleted

## 2014-11-25 NOTE — Telephone Encounter (Signed)
Please let her know that scans show some lung findings and area at vaginal cuff that we will discuss at visit tomorrow.  thanks

## 2014-11-25 NOTE — Telephone Encounter (Signed)
Patient notified of CT scan results as noted below by Dr. Marko Plume. Patient very appreciative of call and states she will come tomorrow to [facility] for lab and MD visit.

## 2014-11-25 NOTE — Telephone Encounter (Signed)
Pt. Called regarding results of CT Scan done on 11/19/14. Patient sees Dr. Marko Plume tomorrow (11/26/14) at 1 pm. Patient wants results today.

## 2014-11-26 ENCOUNTER — Telehealth: Payer: Self-pay

## 2014-11-26 ENCOUNTER — Encounter: Payer: Self-pay | Admitting: Oncology

## 2014-11-26 ENCOUNTER — Ambulatory Visit (HOSPITAL_BASED_OUTPATIENT_CLINIC_OR_DEPARTMENT_OTHER): Payer: Medicaid Other | Admitting: Oncology

## 2014-11-26 ENCOUNTER — Other Ambulatory Visit (HOSPITAL_BASED_OUTPATIENT_CLINIC_OR_DEPARTMENT_OTHER): Payer: Medicaid Other

## 2014-11-26 ENCOUNTER — Telehealth: Payer: Self-pay | Admitting: Oncology

## 2014-11-26 ENCOUNTER — Ambulatory Visit (HOSPITAL_BASED_OUTPATIENT_CLINIC_OR_DEPARTMENT_OTHER): Payer: Medicaid Other

## 2014-11-26 VITALS — BP 121/93 | HR 86 | Temp 98.0°F | Resp 19 | Ht 62.0 in | Wt 219.8 lb

## 2014-11-26 DIAGNOSIS — Z452 Encounter for adjustment and management of vascular access device: Secondary | ICD-10-CM

## 2014-11-26 DIAGNOSIS — E876 Hypokalemia: Secondary | ICD-10-CM

## 2014-11-26 DIAGNOSIS — E039 Hypothyroidism, unspecified: Secondary | ICD-10-CM

## 2014-11-26 DIAGNOSIS — J189 Pneumonia, unspecified organism: Secondary | ICD-10-CM

## 2014-11-26 DIAGNOSIS — C562 Malignant neoplasm of left ovary: Secondary | ICD-10-CM

## 2014-11-26 DIAGNOSIS — C563 Malignant neoplasm of bilateral ovaries: Secondary | ICD-10-CM

## 2014-11-26 DIAGNOSIS — C561 Malignant neoplasm of right ovary: Secondary | ICD-10-CM | POA: Diagnosis not present

## 2014-11-26 DIAGNOSIS — Z95828 Presence of other vascular implants and grafts: Secondary | ICD-10-CM

## 2014-11-26 LAB — COMPREHENSIVE METABOLIC PANEL (CC13)
ALT: 11 U/L (ref 0–55)
ANION GAP: 16 meq/L — AB (ref 3–11)
AST: 13 U/L (ref 5–34)
Albumin: 3.5 g/dL (ref 3.5–5.0)
Alkaline Phosphatase: 75 U/L (ref 40–150)
BILIRUBIN TOTAL: 0.5 mg/dL (ref 0.20–1.20)
BUN: 9.8 mg/dL (ref 7.0–26.0)
CO2: 20 meq/L — AB (ref 22–29)
CREATININE: 1 mg/dL (ref 0.6–1.1)
Calcium: 8.6 mg/dL (ref 8.4–10.4)
Chloride: 105 mEq/L (ref 98–109)
EGFR: 67 mL/min/{1.73_m2} — ABNORMAL LOW (ref >90)
Glucose: 83 mg/dl (ref 70–140)
Potassium: 3.3 mEq/L — ABNORMAL LOW (ref 3.5–5.1)
Sodium: 140 mEq/L (ref 136–145)
Total Protein: 7.6 g/dL (ref 6.4–8.3)

## 2014-11-26 LAB — CBC WITH DIFFERENTIAL/PLATELET
BASO%: 1.3 % (ref 0.0–2.0)
Basophils Absolute: 0.1 10*3/uL (ref 0.0–0.1)
EOS%: 7.4 % — AB (ref 0.0–7.0)
Eosinophils Absolute: 0.6 10*3/uL — ABNORMAL HIGH (ref 0.0–0.5)
HCT: 37.5 % (ref 34.8–46.6)
HGB: 11.6 g/dL (ref 11.6–15.9)
LYMPH#: 1.7 10*3/uL (ref 0.9–3.3)
LYMPH%: 20.7 % (ref 14.0–49.7)
MCH: 27.3 pg (ref 25.1–34.0)
MCHC: 30.9 g/dL — ABNORMAL LOW (ref 31.5–36.0)
MCV: 88.4 fL (ref 79.5–101.0)
MONO#: 0.4 10*3/uL (ref 0.1–0.9)
MONO%: 5 % (ref 0.0–14.0)
NEUT#: 5.4 10*3/uL (ref 1.5–6.5)
NEUT%: 65.6 % (ref 38.4–76.8)
PLATELETS: 255 10*3/uL (ref 145–400)
RBC: 4.24 10*6/uL (ref 3.70–5.45)
RDW: 18.2 % — ABNORMAL HIGH (ref 11.2–14.5)
WBC: 8.2 10*3/uL (ref 3.9–10.3)

## 2014-11-26 MED ORDER — HEPARIN SOD (PORK) LOCK FLUSH 100 UNIT/ML IV SOLN
500.0000 [IU] | Freq: Once | INTRAVENOUS | Status: AC
  Administered 2014-11-26: 500 [IU] via INTRAVENOUS
  Filled 2014-11-26: qty 5

## 2014-11-26 MED ORDER — SODIUM CHLORIDE 0.9 % IJ SOLN
10.0000 mL | INTRAMUSCULAR | Status: DC | PRN
  Administered 2014-11-26: 10 mL via INTRAVENOUS
  Filled 2014-11-26: qty 10

## 2014-11-26 NOTE — Progress Notes (Signed)
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 She had surgery by Dr [name] 06-03-14, then extremely complicated course from respiratory standpoint with 2 cycles of carboplatin and taxol attempted thru 09-02-14.  Patient rescheduled CT and this MD appointment several times; she did have CT CAP done in [facility] system on 11-19-14, which we have reviewed now.   Patient reports that she had treatment by Dr [name] for lower respiratory infection for most of March, with cephalexin followed by levaquin, and on steroids for most of that time. She was seen last 2 weeks ago, with antibiotics and prednisone stopped, and has continued mucinex with usual  Singulair, duoneb and advair since then. She has been clearing yellow/ green sputum better in last 2 weeks and has not been febrile or more SOB.  She has some loose stools, tho mostly formed BMs; she uses senokot prn, with chronic narcotics. She has had no bleeding including no hemoptysis. She denies abdominal or pelvic pain. She has urinary incontinence when coughs.  PAC in, used for CT and by PCP last week FTKA with genetics counselor on 11-22-14.  Daughter just had 62st birthday.   ONCOLOGIC HISTORY Patient presented with >= 1 year of abdominal pain, which she recalls was evaluated variously during that time at EDs [place], [place], including CTs. From this EMR, she was seen in [facility] system ED 09-2013, 10-2013, admitted 11-2013, ED 01-2014, and admitted 02-2014. Admissions were for respiratory indications.  She had CTs AP 10-2013 and 11-2013 in  [facility] system.  Patient was post vaginal hysterectomy ~ 2005 for cervical dysplasia. In 05-2014 she was found to have gyn abnormalities on CT, referred to Dr [name]. She had pulmonary evaluation by Dr [name] prior to surgery, then exploratory laparotomy 06-03-14 (I believe at [facility]) with BSO, tumor debulking and right pelvic node evaluation. Pathology [facility] [phone] from 06-03-14) bilateral ovaries and fallopian tubes with high grade serous carcinoma with clear cell changes, with tumor entirely replacing left ovary 12.1 cm maximum diameter and on right also extending into paraovarian soft tissue, extensive angiolymphatic space involvement, as well as involvement of sigmoid mesentery, posterior cul de sac, right pelvic sidewall, omentum, 6 of 6 nodes involved, and malignant ascites ([id number], that report not included other than mentioned in surgical path). Baseline CA 125 not found in this information.  She was hospitalized again Nov 8-14, 2015 at [place], with hospital problem list of pelvic abscess, COPD, nausea vomiting and chronic pain; per patient, she had drain during this hospitalization (?) for question of pelvic abscess vs lymphocoele. She had colonoscopy 07-21-15, apparently not remarkable (that report not available to me). She received first chemotherapy with weekly taxol carboplatin on 08-05-14, per patient chemotherapy given in [place] location, possibly carboplatin on 12-17 and taxol x 3 afterwards. She was admitted 08-23-14 with respiratory symptoms and possible gastritis/ colitis, with ANC nadir 0.41 during that hospitalization. She received cycle 2 taxol carboplatin 09-02-14, then was again admitted to [facility] 09-04-14 with acute respiratory failure, requiring several days on ventilator in ICU. She was not neutropenic during that hospitalization; I am not clear if she  had gCSF support. At visit with Dr [name] 09-22-14, pulmonary situation was not stable for  resuming chemotherapy, tho further delay in the chemotherapy was understandably of concern due to high risk features of this ovarian cancer as noted. Dr [name] encouraged follow up with pulmonary physician and offered second opinion including at tertiary care institution, which patient and mother declined then. Second opinion consultation was subsequently requested at this office on 09-29-2014.e January hospitalizations. As far as I know, she has not had bronchosopy.  LE doppler negative for DVT ~ early Dec 2015  Medical situation otherwise is remarkable for a history of asthma since childhood and COPD, with multiple admissions for pneumonia prior to starting chemo, reportedly admissions at least x12 in the year prior to starting chemo. She is now established with Dr [name] for pulmonary. She has chronic pain since tractor accident when young, with fractures of pelvis and vertebrae; she is followed by PCP under pain management contract for this.       Review of systems as above, also: No new or different pain. No increase in lymphedema LLE. No problems with PAC. Remainder of 10 point Review of Systems negative.  Objective:  Vital signs in last 24 hours:  BP 121/93 mmHg  Pulse 86  Temp(Src) 98 F (36.7 C) (Oral)  Resp 19  Ht 5' 2"  (1.575 m)  Wt 219 lb 12.8 oz (99.701 kg)  BMI 40.19 kg/m2  SpO2 100% Weight down 1 lb Alert, oriented and appropriate. Ambulatory without difficulty.   HEENT:PERRL, sclerae not icteric. Oral mucosa moist without lesions, posterior pharynx clear.  Neck supple. No JVD.  Lymphatics:no cervical,supraclavicular or inguinal adenopathy Resp: Breath sounds heard to bases bilaterally without wheezes or crackles, no dullness to percussion Cardio: regular rate and rhythm. No gallop. UT:MLYYTKP obese,  soft, nontender, not obviously distended, cannot appreciate mass or organomegaly. Normally active bowel sounds. Surgical incision healed. Musculoskeletal/ Extremities:  without pitting edema, cords, tenderness Neuro: no peripheral neuropathy. Otherwise nonfocal. PSYCH appropriate mood and affect Skin without rash, ecchymosis, petechiae  Portacath-without erythema or tenderness  Lab Results:  Results for orders placed or performed in visit on 11/26/14  CBC with Differential  Result Value Ref Range   WBC 8.2 3.9 - 10.3 10e3/uL   NEUT# 5.4 1.5 - 6.5 10e3/uL   HGB 11.6 11.6 - 15.9 g/dL   HCT 37.5 34.8 - 46.6 %   Platelets 255 145 - 400 10e3/uL   MCV 88.4 79.5 - 101.0 fL   MCH 27.3 25.1 - 34.0 pg   MCHC 30.9 (L) 31.5 - 36.0 g/dL   RBC 4.24 3.70 - 5.45 10e6/uL   RDW 18.2 (H) 11.2 - 14.5 %   lymph# 1.7 0.9 - 3.3 10e3/uL   MONO# 0.4 0.1 - 0.9 10e3/uL   Eosinophils Absolute 0.6 (H) 0.0 - 0.5 10e3/uL   Basophils Absolute 0.1 0.0 - 0.1 10e3/uL   NEUT% 65.6 38.4 - 76.8 %   LYMPH% 20.7 14.0 - 49.7 %   MONO% 5.0 0.0 - 14.0 %   EOS% 7.4 (H) 0.0 - 7.0 %   BASO% 1.3 0.0 - 2.0 %  Comprehensive metabolic panel (Cmet) - [facility]  Result Value Ref Range   Sodium 140 136 - 145 mEq/L   Potassium 3.3 (L) 3.5 - 5.1 mEq/L   Chloride 105 98 - 109 mEq/L   CO2 20 (L) 22 - 29 mEq/L   Glucose 83 70 - 140 mg/dl   BUN 9.8 7.0 - 26.0 mg/dL   Creatinine  1.0 0.6 - 1.1 mg/dL   Total Bilirubin 0.50 0.20 - 1.20 mg/dL   Alkaline Phosphatase 75 40 - 150 U/L   AST 13 5 - 34 U/L   ALT 11 0 - 55 U/L   Total Protein 7.6 6.4 - 8.3 g/dL   Albumin 3.5 3.5 - 5.0 g/dL   Calcium 8.6 8.4 - 10.4 mg/dL   Anion Gap 16 (H) 3 - 11 mEq/L   EGFR 67 (L) >90 ml/min/1.73 m2    CA 125 available after visit 19, this having been 17 on 10-18-14  Studies/Results:  EXAM: CT CHEST, ABDOMEN, AND PELVIS WITH CONTRAST  TECHNIQUE: Multidetector CT imaging of the chest, abdomen and pelvis was performed following the standard protocol during bolus administration of intravenous contrast.  CONTRAST: 120m OMNIPAQUE IOHEXOL 300 MG/ML SOLN  COMPARISON: 12/09/2013.  FINDINGS: CT CHEST  FINDINGS  Mediastinum/Nodes: Right Port-A-Cath tip is positioned in the upper right atrium, near the junction with the SVC. There is no axillary lymphadenopathy. No mediastinal or hilar lymphadenopathy. Heart size is upper normal. No pericardial effusion.  Lungs/Pleura: The patchy airspace consolidation, right greater than left, seen on the previous study has improved in the interval although there is a diffuse tree in bud pattern of micro nodularity diffusely in the right lung and involving the left lower lobe. Pulmonary nodules are distributed in a bronchovascular distribution. 8 mm right lower lobe nodule seen on image 44 is stable in the interval.  Musculoskeletal: Bone windows reveal no worrisome lytic or sclerotic osseous lesions.  CT ABDOMEN AND PELVIS FINDINGS  Hepatobiliary: No focal abnormality within the liver parenchyma. Gallbladder is surgically absent. No intrahepatic or extrahepatic biliary dilation.  Pancreas: No focal mass lesion. No dilatation of the main duct. No intraparenchymal cyst. No peripancreatic edema.  Spleen: No splenomegaly. No focal mass lesion.  Adrenals/Urinary Tract: No adrenal nodule or mass. Kidneys are normal in appearance bilaterally. No hydroureter. Urinary bladder is unremarkable.  Stomach/Bowel: Small hiatal hernia noted. Stomach otherwise unremarkable. Duodenum is normally positioned as is the ligament of Treitz. No small bowel wall thickening. No small bowel dilatation. Terminal ileum normal. Appendix normal. No gross colonic mass. No colonic wall thickening. No substantial diverticular change.  Vascular/Lymphatic: No abdominal aortic aneurysm. No abdominal lymphadenopathy. No pelvic sidewall lymphadenopathy.  Reproductive: Uterus is surgically absent. There is no adnexal mass.  Other: 14 x 8 mm hyper attenuating nodule is seen along the vaginal cuff (image 108 series 2). This could be a complex or hemorrhagic cyst,  but enhancement related to a metastatic deposit cannot be excluded. There is some edema in the soft tissues of the pelvic floor. No evidence for pelvic abscess.  Musculoskeletal: Bone windows reveal no worrisome lytic or sclerotic osseous lesions.  IMPRESSION: 1. Interval improvement without resolution of the right greater than left patchy airspace disease. Appearance on today's exam is more of a micronodular pattern with bronchovascular distribution. Atypical infection would be a consideration. 2. Status post interval bilateral oophorectomy. There is a small hyper attenuating nodule along the vaginal cuff which will require close followup is metastatic disease could have this appearance.   CT images reviewed on PACS by MD and with patient and family.   Medications: I have reviewed the patient's current medications. She is not on potassium supplement.  DISCUSSION: we have discussed interval history since I met her in late Feb. We have reviewed CT information as above including pulmonary findings and nodule at vaginal cuff. We have again discussed fact that pulmonary status  did not allow full course of adjuvant chemotherapy; she has had recurrent lower respiratory concerns for the past month.  CT has question of recurrent disease at vaginal cuff, but otherwise nothing that is obviously persistent or recurrent ovarian carcinoma. She needs evaluation of the vaginal cuff area by gyn oncology, as documentation of recurrent disease would need consideration of some treatment option, while benign findings would allow ongoing close observation. If she needs systemic treatment now for recurrent disease or in future, it would be best to optimize pulmonary status however possible. With radiographic findings that may indicate atypical pulmonary infection, I would be interested in Dr Marcelle Smiling thoughts as to other cultures and possibly bronchoscopy. Patient is to see Dr [name] next week.  Patient has been  given 2 discs of the CTs, for Dr [name] and for gyn oncology.  As she is well known to Dr [name] and to Dr Freddrick March at [facility], I have encouraged her to be seen back at that office re the questioned nodule at vaginal cuff. Patient and her mother request Dr Freddrick March, who they tell me assisted at her surgery and saw her during hospitalizations after chemotherapy treatments. I have spoken directly with that office [phone]) and will have records faxed [phone]) in addition to CT on disc as noted. Dr Vanuatu has availability 12-06-14. Patient understands that gyn oncology is best to evaluate this concern, and that  I specialize in medical oncology.  I will see her back after above.   Assessment/Plan: 1.IIIC high grade serous carcinoma with clear cell features involving bilateral ovaries at surgery 06-03-14: chemotherapy delayed and complicated by comorbid illness and other issues, with <= 2 cycles carbo taxol from 08-05-14 thru 09-02-14, as well as hospitalizations for respiratory decompensation/ illness after each treatment. Due to length of time from surgery and medical concerns, CT CAP done 11-19-14, showing possible nodule at vaginal cuff without other clear persistent or metastatic gyn cancer. Will ask gyn oncology to see back re vaginal cuff finding. If no malignancy documented, may be best to follow closely on observation; if this is local recurrence will need to consider treatment options. 2.recurrent pneumonias: multiple hospitalizations in past 1-2 years, intubated x several days at [facility] in Jan 2016. Dr [name] now following. CT has changes in right lung and LLL which may be atypical infection. Will ask Dr [name] to review and assist. I do not believe she is stable to attempt further chemo in adjuvant fashion; if documented recurrent disease will need to consider again. 3.asthma since childhood. Minimal past tobacco 4.PAC in. Needs to be flushed every 6-8 weeks if not used otherwise. 5.chronic  pain since tractor accident in childhood: all pain medication by PCP under pain management contract 6.family history of ovarian and breast cancer in mother. Patient FKTA with genetics counselor after my first visit. 7.post laparoscopic cholecystectomy, vaginal hysterectomy for cervical indications, knee surgeries, C section 8.hypothyroid x ~ 20 years, on replacement 9.overdue dental exam 10. Reported ulcerative colitis 2000, tho I am not sure of that diagnosis 11.morbid obesity, BMI 40.3 12.long standing GERD. Colonoscopy done Dec 2015 13.past incarceration for drugs   I will see her back in ~ 6 weeks, or sooner if needed depending on evaluations by pulmonary and gyn oncology. All questions addressed as best I am able. Patient and family understand discussion and are in agreement with plans. Cc this note and scan information to other MDs involved. Time spent 30 min including >50% counseling and coordination of care.    Debra [name],Debra  P, MD   11/26/2014, 3:14 PM

## 2014-11-26 NOTE — Telephone Encounter (Signed)
Estill Bamberg from [facility] called stating Dr Marko Plume was requesting pt see Dr Vanuatu. B/c pt is actively being seen by Dr [name] in the same office the pt will need to see Dr [name]. Estill Bamberg requests a call back from [facility] to arrange this. Her direct number is [phone].

## 2014-11-26 NOTE — Telephone Encounter (Signed)
Appointments made and avs printed for patient °

## 2014-11-26 NOTE — Patient Instructions (Signed)

## 2014-11-27 LAB — CA 125: CA 125: 19 U/mL (ref <35)

## 2014-11-30 ENCOUNTER — Telehealth: Payer: Self-pay

## 2014-11-30 NOTE — Telephone Encounter (Signed)
Faxed office notes and CT Scan report to [place] as requested below by Dr. Marko Plume.

## 2014-11-30 NOTE — Telephone Encounter (Signed)
-----   Message from Gordy Levan, MD sent at 11/28/2014  6:09 PM EDT ----- Please fax my last 2 notes and copy of CT CAP report to [facility] gyn onc  [phone]   "Attn Dr Freddrick March or Dr [name]"  thanks

## 2014-12-01 ENCOUNTER — Telehealth: Payer: Self-pay

## 2014-12-01 NOTE — Telephone Encounter (Signed)
Spoke with Debra [name] and she stated that a Nurse with Dr. [name] needs to speak with Debra [name] to see if she will see Dr. [name].  She cannot switch to Debra [name].  If she does not want to see Debra [name] or other office representative will call Dr. Mariana Kaufman office as Debra [name] would need to be referred to another practice.

## 2014-12-01 NOTE — Telephone Encounter (Signed)
-----   Message from Gordy Levan, MD sent at 11/28/2014  5:19 PM EDT ----- Please let her know K a little low at 3.3. Suggest increase in diet and have PCP follow up with next chemistries.   thanks

## 2014-12-01 NOTE — Telephone Encounter (Signed)
LM in Debra [name]' voice mail to call back to discuss results of potassium level as noted below by Dr. Marko Plume.

## 2014-12-02 ENCOUNTER — Telehealth: Payer: Self-pay | Admitting: Oncology

## 2014-12-02 NOTE — Telephone Encounter (Signed)
Medical Oncology   Dr Skinner called to discuss request that patient be seen back at that office for gyn onc follow up. Course and concerns since I met patient in late Feb 2016 reviewed. Dr Skinner's office had been under impression that patient did not want to return to their care after last seen, so would prefer that we set her up with another gyn oncologist.  We will be in touch with patient and make another referral.  L.Livesay, MD  

## 2014-12-02 NOTE — Telephone Encounter (Signed)
Medical Oncology  LM requesting patient call back to speak either to this MD or to RN about other physician appointments.  If RN takes call:  please let her know that I have spoken with [facility] gyn oncology office twice, but will not be able to set up appointment back there. I still need her to be seen by gyn oncology to evaluate findings on recent scans; if ok with patient, I would like to make appointment for her to see gyn oncology MD at [facility].  I think patient and family will be ok with this change in plan, as they had been interested in getting all of her cancer care at this location.  If patient agrees, please make new patient appointment to any of the gyn oncologists at [facility], not urgent but next available is fine.  Godfrey Pick, MD

## 2014-12-06 ENCOUNTER — Telehealth: Payer: Self-pay | Admitting: Oncology

## 2014-12-06 NOTE — Telephone Encounter (Signed)
Medical Oncology   As I have not been able to reach patient 4-14 or today, I also called mother's #, spoke with her and asked that she give Twilia message to call back to this office.  If RN takes call, see my phone note to patient 12-02-14 @ 1738.  Godfrey Pick, MD

## 2014-12-06 NOTE — Telephone Encounter (Signed)
Medical Oncology  No return call from message left on her VM on 12-02-14. Another message left now requesting patient call back to speak with MD or RN. If RN takes call, please see my phone note to patient @ 17:38 on 12-02-14.  Godfrey Pick, MD

## 2014-12-07 ENCOUNTER — Telehealth: Payer: Self-pay | Admitting: Oncology

## 2014-12-07 ENCOUNTER — Telehealth: Payer: Self-pay

## 2014-12-07 ENCOUNTER — Other Ambulatory Visit: Payer: Self-pay | Admitting: Oncology

## 2014-12-07 DIAGNOSIS — C561 Malignant neoplasm of right ovary: Secondary | ICD-10-CM

## 2014-12-07 DIAGNOSIS — C563 Malignant neoplasm of bilateral ovaries: Secondary | ICD-10-CM

## 2014-12-07 DIAGNOSIS — C562 Malignant neoplasm of left ovary: Principal | ICD-10-CM

## 2014-12-07 NOTE — Telephone Encounter (Signed)
Told Ms. Jacquez that she would need to be referred to another GYN Oncologist as noted below.  Ms. Schreckengost is comfortable with this .   Ms. Pisani said it would be fine to be referred to Dr. Everitt Amber if that is possible. Will notify Dr. Marko Plume of Ms. Atkin's request.  Ms. Gorton stated that she is having more pulmonary issues.  She is seeing Dr. Ranae Plumber today.   He has the disc of the CTscan done at [facility].

## 2014-12-07 NOTE — Telephone Encounter (Signed)
-----   Message from Gordy Levan, MD sent at 11/28/2014  5:19 PM EDT ----- Please let her know K a little low at 3.3. Suggest increase in diet and have PCP follow up with next chemistries.   thanks

## 2014-12-07 NOTE — Telephone Encounter (Signed)
per LL to sch New patient appt w/ Dr Renford Dills pt info to Franki Cabot she will sch and call pt

## 2014-12-07 NOTE — Telephone Encounter (Signed)
[facility] [place] [facility]     Small Medium Large Extra Extra Large    Debra [name]  12/02/2014  Telephone  MRN:  [id number]   Description: 44 year old female  Provider: Gordy Levan, MD  Department: [facility]          Call Documentation      Gordy Levan, MD at 12/02/2014 10:49 AM     Status: Signed       Expand All Collapse All   Medical Oncology   Dr [name] called to discuss request that patient be seen back at that office for gyn onc follow up. Course and concerns since I met patient in late Feb 2016 reviewed. Dr Shelba Flake office had been under impression that patient did not want to return to their care after last seen, so would prefer that we set her up with another gyn oncologist.  We will be in touch with patient and make another referral.  L.Livesay, MD              Encounter MyChart Messages     No messages in this encounter     Created by     Gordy Levan, MD on 12/02/2014 10:48 AM     [place], [place]     Contacts       Type Contact Phone    12/02/2014 10:49 AM Phone (Incoming) Dr [name] [phone]    see documentation

## 2014-12-07 NOTE — Telephone Encounter (Signed)
Told Debra [name] KCL level from 11-26-14 visit as noted below by Dr. Marko Plume.  Patient verbalized understanding.

## 2014-12-08 ENCOUNTER — Telehealth: Payer: Self-pay

## 2014-12-08 NOTE — Telephone Encounter (Signed)
Told Ms. Ringgenberg that she has an appointment with Dr. Katha Hamming at [facility] on Monday 12-13-14 at 1100.  She needs to arrive at the cancer center at 1030.  Pt verbalized understanding.

## 2014-12-09 ENCOUNTER — Telehealth: Payer: Self-pay | Admitting: *Deleted

## 2014-12-09 NOTE — Telephone Encounter (Signed)
TC from patient seeking to clarify upcoming appointments. She states her home phone has not been working well and she thinks she missed some calls. Reviewed appts for next month with pt and she verbalized understanding. No further needs identified.

## 2014-12-13 ENCOUNTER — Ambulatory Visit: Payer: Medicaid Other | Attending: Gynecologic Oncology | Admitting: Gynecologic Oncology

## 2014-12-13 ENCOUNTER — Encounter: Payer: Self-pay | Admitting: Gynecologic Oncology

## 2014-12-13 VITALS — BP 104/69 | HR 82 | Temp 98.2°F

## 2014-12-13 DIAGNOSIS — N898 Other specified noninflammatory disorders of vagina: Secondary | ICD-10-CM | POA: Diagnosis not present

## 2014-12-13 DIAGNOSIS — C562 Malignant neoplasm of left ovary: Secondary | ICD-10-CM | POA: Insufficient documentation

## 2014-12-13 DIAGNOSIS — C569 Malignant neoplasm of unspecified ovary: Secondary | ICD-10-CM

## 2014-12-13 DIAGNOSIS — C561 Malignant neoplasm of right ovary: Secondary | ICD-10-CM | POA: Insufficient documentation

## 2014-12-13 NOTE — Patient Instructions (Addendum)
Plan for a CT scan of the abdomen and pelvis on July 13 at 10:30am to evaluate the stability of the area at the top of the vagina.  You will be scheduled to see Dr. Denman George after that.  Please call for any questions or concerns.  Please attend your genetics appointment.

## 2014-12-13 NOTE — Progress Notes (Signed)
Consult Note: Gyn-Onc  Consult was requested by Dr. Marko Plume for the evaluation of Debra [name] 44 y.o. female  CC:  Chief Complaint  Patient presents with  . Ovarian Cancer    Assessment/Plan:  Ms. Debra [name]  is a 44 y.o.  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 She has a 1 cm nodule at the vaginal cuff on imaging. It is not well appreciated on physical examination. It is equivocal in my opinion for persistent/recurrent cancer.  I performed a history, physical examination, and personally reviewed the patient's imaging films including  The CT of the abdomen and pelvis from 11/19/14.   Ms. Mierzwa still has a poor performance status for resuming chemotherapy if she has ongoing pulmonary issues. This in addition to the equivocal nature of her vaginal nodule suggests that we should continue to observe it at this point in time. I'm recommending repeat CT imaging of the mass in 2-3 months. I will evaluate her again at that time. If there is something more appreciable on physical examination and/or growth in the mass on imaging we should presume that is recurrence of her cancer and consider her for resumption of chemotherapy at that time pending her improving her baseline pulmonary medical health. Certainly increasing her platinum free interval is not deleterious given the asymptomatic nature of this vaginal margin oral, and the absence of any other disease on imaging and her normal CA-125.   HPI: Debra [name] is a 44 year old para swollen woman who is seen in consultation at the request of Dr. 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 The patient's history began in October 2015 at which time she underwent surgery with Dr. Steward Ros in [place]. The surgery included an exploratory laparotomy, BSO and debulking of stage IIIc ovarian cancer. The patient reports that Dr. [name]  reported that all macroscopic disease was removed at the time of surgery. She healed initially well from her surgery with the exception of development of a pelvic abscess in November 2015 requiring placement of a drain.  She commenced her first cycle of carboplatin and paclitaxel chemotherapy on 08/05/2014. This was administered in [place]. She developed respiratory symptoms and possibly pneumonia after her first treatment and beginning her second treatment. Following this time she was hospitalized and intubated, but was not neutropenic. She had acute respiratory failure and possible pneumonia (per patient). Between December and mid January 2016 she received a total of 2 cycles of Paclitaxel chemotherapy. After that point in time the patient declined additional therapy. It is unclear if she had imaging studies performed immediately after completing chemotherapy. She subsequently established care with the pulmonologist Dr. [name].   She saw Dr. Marko Plume in April 2016 for evaluation. As part of her workup she underwent a CT of the abdomen and pelvis on 11/19/2014. This demonstrated a small hypoattenuating nodule along the vaginal cuff measuring 14 x 8 mm. It was described as possibly a complex or hemorrhagic cyst but enhancement related to metastatic deposit cannot be excluded. No other lesions suspicious for metastatic or recurrent disease were appreciated.  Interval History: She denies vaginal bleeding or abnormal discharge. She denies pelvic pain. She has persistent left lower extremity lymphedema after placement of a pelvic drain for pelvic abscesses in December 2015.  Current Meds:  Outpatient Encounter Prescriptions as of 12/13/2014  Medication Sig  . albuterol (PROAIR HFA) 108 (90 BASE) MCG/ACT inhaler Inhale 2 puffs into  the lungs every 4 (four) hours as needed for wheezing or shortness of breath.  . ALPRAZolam (XANAX) 0.5 MG tablet Take 0.5 mg by mouth 2 (two) times daily.  . benzonatate  (TESSALON) 100 MG capsule   . buPROPion (WELLBUTRIN XL) 300 MG 24 hr tablet Take 300 mg by mouth every morning.   . citalopram (CELEXA) 40 MG tablet Take 40 mg by mouth every morning.   Marland Kitchen Dextromethorphan-Guaifenesin (MUCINEX DM) 30-600 MG TB12 Take 2 tablets by mouth every 6 (six) hours as needed (for cough/congestion).  . Fluticasone-Salmeterol (ADVAIR DISKUS) 250-50 MCG/DOSE AEPB Inhale 1 puff into the lungs 2 (two) times daily.  . furosemide (LASIX) 40 MG tablet Take 40 mg by mouth daily.  Marland Kitchen ipratropium (ATROVENT) 0.02 % nebulizer solution   . ipratropium-albuterol (DUONEB) 0.5-2.5 (3) MG/3ML SOLN Take 3 mLs by nebulization every 6 (six) hours as needed (for wheezing. Take three times daily for  next 3 days).  Marland Kitchen levofloxacin (LEVAQUIN) 500 MG tablet Take 500 mg by mouth.  . levothyroxine (SYNTHROID, LEVOTHROID) 150 MCG tablet Take 150 mcg by mouth daily before breakfast.  . lidocaine-prilocaine (EMLA) cream Apply 1 application topically daily as needed.  . montelukast (SINGULAIR) 10 MG tablet Take 10 mg by mouth at bedtime.   Marland Kitchen morphine (MS CONTIN) 30 MG 12 hr tablet Take 1 tablet (30 mg total) by mouth 2 (two) times daily.  Marland Kitchen morphine (MSIR) 15 MG tablet Take 15 mg by mouth daily as needed for severe pain.  Marland Kitchen omeprazole (PRILOSEC) 40 MG capsule Take 40 mg by mouth every morning.   . ondansetron (ZOFRAN) 8 MG tablet Take 8 mg by mouth every 8 (eight) hours as needed.  . polyethylene glycol powder (GLYCOLAX/MIRALAX) powder Take 17 g by mouth daily as needed.  . predniSONE (DELTASONE) 10 MG tablet Take 4 tablets for 3 days, Take 3 tablets for 3 days, Take 2 tablets for 3 days, Take 1 tablet for 3 days  . senna (SENOKOT) 8.6 MG TABS tablet Take 1 tablet by mouth daily.  Marland Kitchen tiotropium (SPIRIVA) 18 MCG inhalation capsule Place 18 mcg into inhaler and inhale daily.  . traZODone (DESYREL) 100 MG tablet Take 100 mg by mouth at bedtime as needed for sleep.     Allergy:  Allergies  Allergen  Reactions  . Penicillins Swelling and Rash    Social Hx:   History   Social History  . Marital Status: Divorced    Spouse Name: N/A  . Number of Children: N/A  . Years of Education: N/A   Occupational History  . Not on file.   Social History Main Topics  . Smoking status: Former Smoker -- 1.00 packs/day for 3 years    Types: Cigarettes    Quit date: 10/19/2011  . Smokeless tobacco: Never Used  . Alcohol Use: Yes     Comment: rarely   . Drug Use: No  . Sexual Activity: No   Other Topics Concern  . Not on file   Social History Narrative   Lives with her mom currently, but she usually rents an apartment with her daughter.  Occasionally uses a cane.        PCP:  Lovenia Shuck             Past Surgical Hx:  Past Surgical History  Procedure Laterality Date  . Knee surgery      x3, two on left and one on right.  Arthoscopy  . Cholecystectomy    . Abdominal  hysterectomy    . Cesarean section    . Tonsillectomy    . Joint replacement      2x on left, 1x on right    Past Medical Hx:  Past Medical History  Diagnosis Date  . Asthma   . Ulcerative colitis   . Anxiety   . Depression   . Chronic back pain   . Chronic knee pain   . Thyroid disease     hypo  . GERD (gastroesophageal reflux disease)   . Insomnia   . Allergic rhinitis   . Arthritis   . COPD (chronic obstructive pulmonary disease)     Past Gynecological History:  C/s x 2  No LMP recorded. Patient has had a hysterectomy.  Family Hx:  Family History  Problem Relation Age of Onset  . High blood pressure Mother   . Bipolar disorder Mother   . Asthma Father   . Parkinson's disease Father     Review of Systems:  Constitutional  Feels well,    ENT Normal appearing ears and nares bilaterally Skin/Breast  No rash, sores, jaundice, itching, dryness Cardiovascular  No chest pain, shortness of breath, or edema  Pulmonary  No cough or wheeze.  Gastro Intestinal  No nausea, vomitting, or  diarrhoea. No bright red blood per rectum, no abdominal pain, change in bowel movement, or constipation.  Genito Urinary  No frequency, urgency, dysuria, see HPI Musculo Skeletal  No myalgia, arthralgia, joint swelling or pain  Neurologic  No weakness, numbness, change in gait,  Psychology  No depression, anxiety, insomnia.   Vitals:  Blood pressure 104/69, pulse 82, temperature 98.2 F (36.8 C), temperature source Oral.  Physical Exam: WD in NAD Neck  Supple NROM, without any enlargements.  Lymph Node Survey No cervical supraclavicular or inguinal adenopathy Cardiovascular  Pulse normal rate, regularity and rhythm. S1 and S2 normal.  Lungs  Clear to auscultation bilateraly, without wheezes/crackles/rhonchi. Good air movement.  Skin  No rash/lesions/breakdown  Psychiatry  Alert and oriented to person, place, and time  Abdomen  Normoactive bowel sounds, abdomen soft, non-tender and obese without evidence of hernia.  Back No CVA tenderness Genito Urinary  Vulva/vagina: Normal external female genitalia.   No lesions. No discharge or bleeding.  Bladder/urethra:  No lesions or masses, well supported bladder  Vagina: smooth vaginal cuff. Slight bulkiness at midline vaginal cuff appreciated, however it is soft and not nodular or consistent with a met. It is mobile and not fixed.  Cervix: surgically absent  Uterus: surgically absent    Adnexa: no palpable masses. Rectal  Good tone, no masses no cul de sac nodularity.  Extremities  No bilateral cyanosis, clubbing or edema.   Donaciano Eva, MD   12/13/2014, 4:26 PM

## 2015-01-12 ENCOUNTER — Other Ambulatory Visit: Payer: Self-pay | Admitting: Oncology

## 2015-01-12 DIAGNOSIS — C561 Malignant neoplasm of right ovary: Secondary | ICD-10-CM

## 2015-01-12 DIAGNOSIS — C562 Malignant neoplasm of left ovary: Principal | ICD-10-CM

## 2015-01-12 DIAGNOSIS — C563 Malignant neoplasm of bilateral ovaries: Secondary | ICD-10-CM

## 2015-01-13 ENCOUNTER — Other Ambulatory Visit: Payer: Medicaid Other

## 2015-01-13 ENCOUNTER — Ambulatory Visit: Payer: Medicaid Other | Admitting: Oncology

## 2015-01-13 ENCOUNTER — Telehealth: Payer: Self-pay | Admitting: Oncology

## 2015-01-13 NOTE — Telephone Encounter (Signed)
Patient called and reschedule 05/26 appt to 06/20. Confirmed appointment. Mailed calendar for June/July.

## 2015-01-17 ENCOUNTER — Emergency Department (HOSPITAL_COMMUNITY)
Admission: EM | Admit: 2015-01-17 | Discharge: 2015-01-17 | Disposition: A | Discharge status: Home / Self Care | Payer: Medicaid Other | Attending: Emergency Medicine | Admitting: Emergency Medicine

## 2015-01-17 ENCOUNTER — Emergency Department (HOSPITAL_COMMUNITY): Payer: Medicaid Other

## 2015-01-17 ENCOUNTER — Encounter (HOSPITAL_COMMUNITY): Payer: Self-pay

## 2015-01-17 DIAGNOSIS — Z87891 Personal history of nicotine dependence: Secondary | ICD-10-CM | POA: Diagnosis not present

## 2015-01-17 DIAGNOSIS — Z8543 Personal history of malignant neoplasm of ovary: Secondary | ICD-10-CM | POA: Diagnosis not present

## 2015-01-17 DIAGNOSIS — Z79899 Other long term (current) drug therapy: Secondary | ICD-10-CM | POA: Diagnosis not present

## 2015-01-17 DIAGNOSIS — M1990 Unspecified osteoarthritis, unspecified site: Secondary | ICD-10-CM | POA: Insufficient documentation

## 2015-01-17 DIAGNOSIS — Z90710 Acquired absence of both cervix and uterus: Secondary | ICD-10-CM | POA: Insufficient documentation

## 2015-01-17 DIAGNOSIS — J449 Chronic obstructive pulmonary disease, unspecified: Secondary | ICD-10-CM | POA: Insufficient documentation

## 2015-01-17 DIAGNOSIS — F419 Anxiety disorder, unspecified: Secondary | ICD-10-CM | POA: Diagnosis not present

## 2015-01-17 DIAGNOSIS — Z880 Allergy status to penicillin: Secondary | ICD-10-CM | POA: Diagnosis not present

## 2015-01-17 DIAGNOSIS — R197 Diarrhea, unspecified: Secondary | ICD-10-CM | POA: Diagnosis present

## 2015-01-17 DIAGNOSIS — F329 Major depressive disorder, single episode, unspecified: Secondary | ICD-10-CM | POA: Diagnosis not present

## 2015-01-17 DIAGNOSIS — G8929 Other chronic pain: Secondary | ICD-10-CM | POA: Diagnosis not present

## 2015-01-17 DIAGNOSIS — Z9889 Other specified postprocedural states: Secondary | ICD-10-CM | POA: Insufficient documentation

## 2015-01-17 DIAGNOSIS — K529 Noninfective gastroenteritis and colitis, unspecified: Secondary | ICD-10-CM | POA: Diagnosis not present

## 2015-01-17 DIAGNOSIS — Z7951 Long term (current) use of inhaled steroids: Secondary | ICD-10-CM | POA: Diagnosis not present

## 2015-01-17 LAB — CBC WITH DIFFERENTIAL/PLATELET
BASOS ABS: 0 10*3/uL (ref 0.0–0.1)
Basophils Relative: 1 % (ref 0–1)
Eosinophils Absolute: 0.2 10*3/uL (ref 0.0–0.7)
Eosinophils Relative: 5 % (ref 0–5)
HCT: 37.8 % (ref 36.0–46.0)
Hemoglobin: 11.6 g/dL — ABNORMAL LOW (ref 12.0–15.0)
LYMPHS ABS: 1.2 10*3/uL (ref 0.7–4.0)
Lymphocytes Relative: 23 % (ref 12–46)
MCH: 26.2 pg (ref 26.0–34.0)
MCHC: 30.7 g/dL (ref 30.0–36.0)
MCV: 85.3 fL (ref 78.0–100.0)
MONO ABS: 0.5 10*3/uL (ref 0.1–1.0)
Monocytes Relative: 9 % (ref 3–12)
NEUTROS PCT: 62 % (ref 43–77)
Neutro Abs: 3.3 10*3/uL (ref 1.7–7.7)
PLATELETS: 266 10*3/uL (ref 150–400)
RBC: 4.43 MIL/uL (ref 3.87–5.11)
RDW: 15.7 % — AB (ref 11.5–15.5)
WBC: 5.3 10*3/uL (ref 4.0–10.5)

## 2015-01-17 LAB — COMPREHENSIVE METABOLIC PANEL
ALK PHOS: 55 U/L (ref 38–126)
ALT: 10 U/L — AB (ref 14–54)
ANION GAP: 10 (ref 5–15)
AST: 12 U/L — ABNORMAL LOW (ref 15–41)
Albumin: 3.6 g/dL (ref 3.5–5.0)
BUN: 8 mg/dL (ref 6–20)
CO2: 22 mmol/L (ref 22–32)
Calcium: 8.9 mg/dL (ref 8.9–10.3)
Chloride: 108 mmol/L (ref 101–111)
Creatinine, Ser: 1.05 mg/dL — ABNORMAL HIGH (ref 0.44–1.00)
GFR calc Af Amer: >60 mL/min (ref >60)
GFR calc non Af Amer: >60 mL/min (ref >60)
Glucose, Bld: 86 mg/dL (ref 65–99)
Potassium: 3.5 mmol/L (ref 3.5–5.1)
SODIUM: 140 mmol/L (ref 135–145)
Total Bilirubin: 0.7 mg/dL (ref 0.3–1.2)
Total Protein: 7.3 g/dL (ref 6.5–8.1)

## 2015-01-17 LAB — LIPASE, BLOOD: Lipase: 42 U/L (ref 22–51)

## 2015-01-17 MED ORDER — PROMETHAZINE HCL 25 MG/ML IJ SOLN
12.5000 mg | Freq: Once | INTRAMUSCULAR | Status: AC
  Administered 2015-01-17: 12.5 mg via INTRAVENOUS
  Filled 2015-01-17: qty 1

## 2015-01-17 MED ORDER — MORPHINE SULFATE 4 MG/ML IJ SOLN
4.0000 mg | Freq: Once | INTRAMUSCULAR | Status: AC
  Administered 2015-01-17: 4 mg via INTRAVENOUS
  Filled 2015-01-17: qty 1

## 2015-01-17 MED ORDER — HEPARIN SOD (PORK) LOCK FLUSH 100 UNIT/ML IV SOLN
500.0000 [IU] | Freq: Once | INTRAVENOUS | Status: AC
  Administered 2015-01-17: 500 [IU]
  Filled 2015-01-17: qty 5

## 2015-01-17 MED ORDER — HYDROCODONE-ACETAMINOPHEN 5-325 MG PO TABS: 1.0000 | ORAL_TABLET | Freq: Four times a day (QID) | ORAL | Status: DC | PRN

## 2015-01-17 MED ORDER — ONDANSETRON 8 MG PO TBDP: ORAL_TABLET | ORAL | Status: DC

## 2015-01-17 MED ORDER — SODIUM CHLORIDE 0.9 % IV BOLUS (SEPSIS)
1000.0000 mL | Freq: Once | INTRAVENOUS | Status: AC
  Administered 2015-01-17: 1000 mL via INTRAVENOUS

## 2015-01-17 MED ORDER — KETOROLAC TROMETHAMINE 30 MG/ML IJ SOLN
30.0000 mg | Freq: Once | INTRAMUSCULAR | Status: AC
  Administered 2015-01-17: 30 mg via INTRAVENOUS
  Filled 2015-01-17: qty 1

## 2015-01-17 MED ORDER — ONDANSETRON HCL 4 MG/2ML IJ SOLN
4.0000 mg | Freq: Once | INTRAMUSCULAR | Status: AC
  Administered 2015-01-17: 4 mg via INTRAVENOUS
  Filled 2015-01-17: qty 2

## 2015-01-17 NOTE — ED Notes (Signed)
Pt has port. 

## 2015-01-17 NOTE — ED Notes (Signed)
Awake. Verbally responsive. Resp even and unlabored. No audible adventitious breath sounds noted. ABC's intact. BS (+) and active x4 quadrants. No N/V/D reported.

## 2015-01-17 NOTE — ED Notes (Addendum)
Awake. Verbally responsive. Resp even and unlabored. No audible adventitious breath sounds noted. ABC's intact. BS (+) and active x4 quadrants. No N/V/D reported. Pt ambulated to BR with steady gait. Pt has not had any diarrhea since arrival. IV infusing NS at 940ml/hr without difficulty. Family at bedside.

## 2015-01-17 NOTE — ED Notes (Signed)
Awake. Verbally responsive. Resp even and unlabored. No audible adventitious breath sounds noted. ABC's intact. Abd soft/nondistended but tender to palpate. BS (+) and active x4 quadrants. Pt reported n/v/d.

## 2015-01-17 NOTE — Discharge Instructions (Signed)
Zofran as prescribed as needed for nausea. Hydrocodone as prescribed as needed for pain.  Return to the emergency department if you develop bloody stools, worsening pain, high fever or other new and concerning symptoms.   Viral Gastroenteritis Viral gastroenteritis is also known as stomach flu. This condition affects the stomach and intestinal tract. It can cause sudden diarrhea and vomiting. The illness typically lasts 3 to 8 days. Most people develop an immune response that eventually gets rid of the virus. While this natural response develops, the virus can make you quite ill. CAUSES  Many different viruses can cause gastroenteritis, such as rotavirus or noroviruses. You can catch one of these viruses by consuming contaminated food or water. You may also catch a virus by sharing utensils or other personal items with an infected person or by touching a contaminated surface. SYMPTOMS  The most common symptoms are diarrhea and vomiting. These problems can cause a severe loss of body fluids (dehydration) and a body salt (electrolyte) imbalance. Other symptoms may include:  Fever.  Headache.  Fatigue.  Abdominal pain. DIAGNOSIS  Your caregiver can usually diagnose viral gastroenteritis based on your symptoms and a physical exam. A stool sample may also be taken to test for the presence of viruses or other infections. TREATMENT  This illness typically goes away on its own. Treatments are aimed at rehydration. The most serious cases of viral gastroenteritis involve vomiting so severely that you are not able to keep fluids down. In these cases, fluids must be given through an intravenous line (IV). HOME CARE INSTRUCTIONS   Drink enough fluids to keep your urine clear or pale yellow. Drink small amounts of fluids frequently and increase the amounts as tolerated.  Ask your caregiver for specific rehydration instructions.  Avoid:  Foods high in sugar.  Alcohol.  Carbonated  drinks.  Tobacco.  Juice.  Caffeine drinks.  Extremely hot or cold fluids.  Fatty, greasy foods.  Too much intake of anything at one time.  Dairy products until 24 to 48 hours after diarrhea stops.  You may consume probiotics. Probiotics are active cultures of beneficial bacteria. They may lessen the amount and number of diarrheal stools in adults. Probiotics can be found in yogurt with active cultures and in supplements.  Wash your hands well to avoid spreading the virus.  Only take over-the-counter or prescription medicines for pain, discomfort, or fever as directed by your caregiver. Do not give aspirin to children. Antidiarrheal medicines are not recommended.  Ask your caregiver if you should continue to take your regular prescribed and over-the-counter medicines.  Keep all follow-up appointments as directed by your caregiver. SEEK IMMEDIATE MEDICAL CARE IF:   You are unable to keep fluids down.  You do not urinate at least once every 6 to 8 hours.  You develop shortness of breath.  You notice blood in your stool or vomit. This may look like coffee grounds.  You have abdominal pain that increases or is concentrated in one small area (localized).  You have persistent vomiting or diarrhea.  You have a fever.  The patient is a child younger than 3 months, and he or she has a fever.  The patient is a child older than 3 months, and he or she has a fever and persistent symptoms.  The patient is a child older than 3 months, and he or she has a fever and symptoms suddenly get worse.  The patient is a baby, and he or she has no tears when crying.  MAKE SURE YOU:   Understand these instructions.  Will watch your condition.  Will get help right away if you are not doing well or get worse. Document Released: 08/06/2005 Document Revised: 10/29/2011 Document Reviewed: 05/23/2011 [facility] Patient Information 2015 [place], [place]. This information is not intended to replace  advice given to you by your health care provider. Make sure you discuss any questions you have with your health care provider.

## 2015-01-17 NOTE — ED Notes (Signed)
Labs delayed d/t pt requesting to access power port.

## 2015-01-17 NOTE — ED Notes (Addendum)
Pt c/o emesis, diarrhea, chills, R mid back pain, and epigastric pain x 4-5 days.  Pain score 10/10.  Hx of ovarian CA and ulcerative colitis.  Last chemo x 3 months ago.  Pt reports recently completing Levaquin.

## 2015-01-17 NOTE — ED Provider Notes (Signed)
CSN: [id number]     Arrival date & time 01/17/15  1124 History   First MD Initiated Contact with Patient 01/17/15 1135     Chief Complaint  Patient presents with  . Emesis  . Diarrhea  . Chills     (Consider location/radiation/quality/duration/timing/severity/associated sxs/prior Treatment) HPI Comments: Patient is a 44 year old female with history of ovarian cancer and ulcerative colitis. She presents for evaluation of nausea, vomiting, diarrhea, and abdominal pain that is worsened over the past 5 days. She denies any fevers or chills. She denies any cough. She denies any ill contacts. She has had chemotherapy in the past, most recently 3 months ago. She also reports recently being on Levaquin for an upper respiratory infection and reports she has been on this several times in the recent past.  Patient is a 44 y.o. female presenting with vomiting. The history is provided by the patient.  Emesis Severity:  Moderate Duration:  5 days Timing:  Constant Progression:  Worsening Chronicity:  New Recent urination:  Decreased Relieved by:  Nothing Worsened by:  Nothing tried Ineffective treatments:  None tried Associated symptoms: no fever     Past Medical History  Diagnosis Date  . Asthma   . Ulcerative colitis   . Anxiety   . Depression   . Chronic back pain   . Chronic knee pain   . Thyroid disease     hypo  . GERD (gastroesophageal reflux disease)   . Insomnia   . Allergic rhinitis   . Arthritis   . COPD (chronic obstructive pulmonary disease)    Past Surgical History  Procedure Laterality Date  . Knee surgery      x3, two on left and one on right.  Arthoscopy  . Cholecystectomy    . Abdominal hysterectomy    . Cesarean section    . Tonsillectomy    . Joint replacement      2x on left, 1x on right   Family History  Problem Relation Age of Onset  . High blood pressure Mother   . Bipolar disorder Mother   . Asthma Father   . Parkinson's disease Father     History  Substance Use Topics  . Smoking status: Former Smoker -- 1.00 packs/day for 3 years    Types: Cigarettes    Quit date: 10/19/2011  . Smokeless tobacco: Never Used  . Alcohol Use: Yes     Comment: rarely    OB History    No data available     Review of Systems  Gastrointestinal: Positive for vomiting.  All other systems reviewed and are negative.     Allergies  Penicillins  Home Medications   Prior to Admission medications   Medication Sig Start Date End Date Taking? Authorizing Provider  albuterol (PROAIR HFA) 108 (90 BASE) MCG/ACT inhaler Inhale 2 puffs into the lungs every 4 (four) hours as needed for wheezing or shortness of breath. 02/28/14  Yes Shanker Kristeen Mans, MD  ALPRAZolam Duanne Moron) 0.5 MG tablet Take 0.5 mg by mouth 2 (two) times daily.   Yes Historical Provider, MD  benzonatate (TESSALON) 100 MG capsule Take 100 mg by mouth 3 (three) times daily as needed for cough.  01/03/15  Yes Historical Provider, MD  buPROPion (WELLBUTRIN XL) 300 MG 24 hr tablet Take 300 mg by mouth every morning.    Yes Historical Provider, MD  citalopram (CELEXA) 40 MG tablet Take 40 mg by mouth every morning.    Yes Historical Provider, MD  Dextromethorphan-Guaifenesin (MUCINEX DM) 30-600 MG TB12 Take 2 tablets by mouth every 6 (six) hours as needed (for cough/congestion).   Yes Historical Provider, MD  Fluticasone-Salmeterol (ADVAIR DISKUS) 250-50 MCG/DOSE AEPB Inhale 1 puff into the lungs 2 (two) times daily. 12/28/13  Yes Tammy S Parrett, NP  furosemide (LASIX) 40 MG tablet Take 40 mg by mouth daily.   Yes Historical Provider, MD  ipratropium-albuterol (DUONEB) 0.5-2.5 (3) MG/3ML SOLN Take 3 mLs by nebulization every 6 (six) hours as needed (for wheezing. Take three times daily for  next 3 days). 02/28/14  Yes Shanker Kristeen Mans, MD  levothyroxine (SYNTHROID, LEVOTHROID) 150 MCG tablet Take 150 mcg by mouth daily before breakfast.   Yes Historical Provider, MD  montelukast (SINGULAIR)  10 MG tablet Take 10 mg by mouth at bedtime.    Yes Historical Provider, MD  morphine (MS CONTIN) 30 MG 12 hr tablet Take 1 tablet (30 mg total) by mouth 2 (two) times daily. 04/04/13  Yes Janece Canterbury, MD  morphine (MSIR) 15 MG tablet Take 15 mg by mouth daily as needed for severe pain.   Yes Historical Provider, MD  omeprazole (PRILOSEC) 40 MG capsule Take 40 mg by mouth every morning.    Yes Historical Provider, MD  ondansetron (ZOFRAN) 8 MG tablet Take 8 mg by mouth every 8 (eight) hours as needed for nausea or vomiting.  07/07/14  Yes Historical Provider, MD  polyethylene glycol powder (GLYCOLAX/MIRALAX) powder Take 17 g by mouth daily as needed for mild constipation or moderate constipation.    Yes Historical Provider, MD  prochlorperazine (COMPAZINE) 10 MG tablet Take 10 mg by mouth every 6 (six) hours as needed for nausea or vomiting.  01/12/15  Yes Historical Provider, MD  senna (SENOKOT) 8.6 MG TABS tablet Take 8.6 mg by mouth daily.    Yes Historical Provider, MD  tiotropium (SPIRIVA) 18 MCG inhalation capsule Place 18 mcg into inhaler and inhale daily. 09/14/14 09/14/15 Yes Historical Provider, MD  traZODone (DESYREL) 100 MG tablet Take 100 mg by mouth at bedtime as needed for sleep.    Yes Historical Provider, MD  lidocaine-prilocaine (EMLA) cream Apply 1 application topically daily as needed. 07/07/14   Historical Provider, MD   BP 151/126 mmHg  Pulse 84  Temp(Src) 98.1 F (36.7 C) (Oral)  Resp 18  SpO2 100% Physical Exam  Constitutional: She is oriented to person, place, and time. She appears well-developed and well-nourished. No distress.  HENT:  Head: Normocephalic and atraumatic.  Neck: Normal range of motion. Neck supple.  Cardiovascular: Normal rate and regular rhythm.  Exam reveals no gallop and no friction rub.   No murmur heard. Pulmonary/Chest: Effort normal and breath sounds normal. No respiratory distress. She has no wheezes.  Abdominal: Soft. Bowel sounds are  normal. She exhibits no distension. There is tenderness. There is no rebound and no guarding.  There is tenderness to palpation in all 4 quadrants, most notably in the epigastric region.  Musculoskeletal: Normal range of motion.  Neurological: She is alert and oriented to person, place, and time.  Skin: Skin is warm and dry. She is not diaphoretic.  Nursing note and vitals reviewed.   ED Course  Procedures (including critical care time) Labs Review Labs Reviewed  CLOSTRIDIUM DIFFICILE BY PCR (NOT AT [facility])  COMPREHENSIVE METABOLIC PANEL  CBC WITH DIFFERENTIAL/PLATELET  LIPASE, BLOOD    Imaging Review No results found.   EKG Interpretation None      MDM   Final diagnoses:  None    Patient is a 44 year old female with history of COPD, ovarian cancer. She presents with complaints of nausea, vomiting, diarrhea, abdominal pain that is worsening of the past 5 days. Always been nonbloody and non-bilious. Workup reveals no elevation of white count and electrolytes which are essentially unremarkable. She does not appear clinically dehydrated and is feeling better after medications and fluids in the ER. I believe she is appropriate for discharge. I had intended to collect a stool specimen for C. difficile, however the patient has had no further diarrhea since arriving here.    Veryl Speak, MD 01/17/15 1536

## 2015-02-06 ENCOUNTER — Other Ambulatory Visit: Payer: Self-pay | Admitting: Oncology

## 2015-02-07 ENCOUNTER — Other Ambulatory Visit: Payer: Medicaid Other

## 2015-02-07 ENCOUNTER — Ambulatory Visit: Payer: Self-pay | Admitting: Oncology

## 2015-02-08 ENCOUNTER — Telehealth: Payer: Self-pay

## 2015-02-08 NOTE — Telephone Encounter (Signed)
Spoke with Debra [name] about f/u appointment with Dr. Marko Plume in ~3 months pending visit with Dr. Denman George on 03-02-15 as noted below by Dr. Marko Plume. Debra [name] set up for [facility] flush on 03-02-15 after Dr. Serita Grit visit.  Another flush set up for 04-27-15 at 1400.   Will contact Debra [name] with subsequent appointments for visit with Dr. Marko Plume and [facility] flushes pending outcome of visit with Dr. Denman George. Debra [name] verbalized understanding.

## 2015-02-08 NOTE — Telephone Encounter (Signed)
-----   Message from Gordy Levan, MD sent at 02/07/2015  4:21 PM EDT ----- [facility] MD/flush/lab today.  She is to see Dr Denman George with CT in July, so does not need to reschedule my appointment prior to that.   If she is doing well from standpoint of the gyn cancer after she sees Dr Denman George, I could see her ~ 3 mo later = Oct.with lab from [facility].  She has PAC, which I believe gets used by other MDs etc. Please just remind her that this should be flushed every 6-8 weeks, which can be done here if needed.  Thank you

## 2015-03-02 ENCOUNTER — Ambulatory Visit (HOSPITAL_COMMUNITY)
Admission: RE | Admit: 2015-03-02 | Discharge: 2015-03-02 | Disposition: A | Discharge status: Home / Self Care | Payer: Medicaid Other | Source: Ambulatory Visit | Attending: Gynecologic Oncology | Admitting: Gynecologic Oncology

## 2015-03-02 ENCOUNTER — Encounter (HOSPITAL_COMMUNITY): Payer: Self-pay

## 2015-03-02 DIAGNOSIS — R918 Other nonspecific abnormal finding of lung field: Secondary | ICD-10-CM | POA: Insufficient documentation

## 2015-03-02 DIAGNOSIS — Z90710 Acquired absence of both cervix and uterus: Secondary | ICD-10-CM | POA: Insufficient documentation

## 2015-03-02 DIAGNOSIS — K439 Ventral hernia without obstruction or gangrene: Secondary | ICD-10-CM | POA: Insufficient documentation

## 2015-03-02 DIAGNOSIS — C569 Malignant neoplasm of unspecified ovary: Secondary | ICD-10-CM | POA: Insufficient documentation

## 2015-03-02 DIAGNOSIS — Z90722 Acquired absence of ovaries, bilateral: Secondary | ICD-10-CM | POA: Diagnosis not present

## 2015-03-02 DIAGNOSIS — Z08 Encounter for follow-up examination after completed treatment for malignant neoplasm: Secondary | ICD-10-CM | POA: Insufficient documentation

## 2015-03-02 DIAGNOSIS — K449 Diaphragmatic hernia without obstruction or gangrene: Secondary | ICD-10-CM | POA: Insufficient documentation

## 2015-03-02 DIAGNOSIS — R110 Nausea: Secondary | ICD-10-CM | POA: Diagnosis not present

## 2015-03-02 DIAGNOSIS — Z9221 Personal history of antineoplastic chemotherapy: Secondary | ICD-10-CM | POA: Insufficient documentation

## 2015-03-02 MED ORDER — IOHEXOL 300 MG/ML  SOLN
100.0000 mL | Freq: Once | INTRAMUSCULAR | Status: AC | PRN
  Administered 2015-03-02: 100 mL via INTRAVENOUS

## 2015-03-04 ENCOUNTER — Other Ambulatory Visit: Payer: Self-pay | Admitting: Nurse Practitioner

## 2015-03-04 NOTE — Progress Notes (Signed)
Sent POF to add flush apt after Dr. Serita Grit visit 03/07/15 due to no show on 03/02/15 flush apt.

## 2015-03-07 ENCOUNTER — Ambulatory Visit: Payer: Medicaid Other | Attending: Gynecologic Oncology | Admitting: Gynecologic Oncology

## 2015-03-07 ENCOUNTER — Encounter: Payer: Self-pay | Admitting: Gynecologic Oncology

## 2015-03-07 ENCOUNTER — Other Ambulatory Visit (HOSPITAL_BASED_OUTPATIENT_CLINIC_OR_DEPARTMENT_OTHER): Payer: Medicaid Other

## 2015-03-07 VITALS — BP 109/73 | HR 85 | Temp 98.3°F | Resp 18 | Ht 62.0 in | Wt 228.5 lb

## 2015-03-07 DIAGNOSIS — N393 Stress incontinence (female) (male): Secondary | ICD-10-CM | POA: Insufficient documentation

## 2015-03-07 DIAGNOSIS — C562 Malignant neoplasm of left ovary: Principal | ICD-10-CM

## 2015-03-07 DIAGNOSIS — C569 Malignant neoplasm of unspecified ovary: Secondary | ICD-10-CM

## 2015-03-07 DIAGNOSIS — C561 Malignant neoplasm of right ovary: Secondary | ICD-10-CM

## 2015-03-07 DIAGNOSIS — C563 Malignant neoplasm of bilateral ovaries: Secondary | ICD-10-CM

## 2015-03-07 LAB — COMPREHENSIVE METABOLIC PANEL (CC13)
ALBUMIN: 3 g/dL — AB (ref 3.5–5.0)
ALK PHOS: 77 U/L (ref 40–150)
ALT: 18 U/L (ref 0–55)
AST: 10 U/L (ref 5–34)
Anion Gap: 10 mEq/L (ref 3–11)
BUN: 8.5 mg/dL (ref 7.0–26.0)
CHLORIDE: 100 meq/L (ref 98–109)
CO2: 29 mEq/L (ref 22–29)
CREATININE: 0.9 mg/dL (ref 0.6–1.1)
Calcium: 9.4 mg/dL (ref 8.4–10.4)
EGFR: 78 mL/min/{1.73_m2} — ABNORMAL LOW (ref >90)
GLUCOSE: 117 mg/dL (ref 70–140)
Potassium: 3.7 mEq/L (ref 3.5–5.1)
SODIUM: 140 meq/L (ref 136–145)
TOTAL PROTEIN: 7 g/dL (ref 6.4–8.3)
Total Bilirubin: 0.72 mg/dL (ref 0.20–1.20)

## 2015-03-07 LAB — CBC WITH DIFFERENTIAL/PLATELET
BASO%: 0.6 % (ref 0.0–2.0)
BASOS ABS: 0.1 10*3/uL (ref 0.0–0.1)
EOS ABS: 0.6 10*3/uL — AB (ref 0.0–0.5)
EOS%: 6.8 % (ref 0.0–7.0)
HEMATOCRIT: 32.3 % — AB (ref 34.8–46.6)
HGB: 10.1 g/dL — ABNORMAL LOW (ref 11.6–15.9)
LYMPH#: 1.1 10*3/uL (ref 0.9–3.3)
LYMPH%: 13.5 % — ABNORMAL LOW (ref 14.0–49.7)
MCH: 25.6 pg (ref 25.1–34.0)
MCHC: 31.3 g/dL — AB (ref 31.5–36.0)
MCV: 81.7 fL (ref 79.5–101.0)
MONO#: 0.3 10*3/uL (ref 0.1–0.9)
MONO%: 3.5 % (ref 0.0–14.0)
NEUT#: 6.4 10*3/uL (ref 1.5–6.5)
NEUT%: 75.6 % (ref 38.4–76.8)
Platelets: 247 10*3/uL (ref 145–400)
RBC: 3.95 10*6/uL (ref 3.70–5.45)
RDW: 17.5 % — ABNORMAL HIGH (ref 11.2–14.5)
WBC: 8.5 10*3/uL (ref 3.9–10.3)

## 2015-03-07 NOTE — Patient Instructions (Signed)
We will call you with the results of your blood test. We will schedule a CT scan in 2 month with a visit with Dr. Denman George afterwards. Please call sooner with any new or worsening complaints or questions.

## 2015-03-07 NOTE — Progress Notes (Signed)
Follow-up Note: Gyn-Onc  Consult was initially requested by Dr. Marko Plume for the evaluation of Debra [name] 44 y.o. female  CC:  Chief Complaint  Patient presents with  . Ovarian Cancer    Follow up    Assessment/Plan:  Ms. Debra [name]  is a 44 y.o.  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 She has a stable 1 cm nodule at the vaginal cuff on imaging and 2 new <2cm lesions in close proximity in the deep central pelvis. I personally reviewed the films from 03/02/15. I am able to appreciate some fullness/firmness on vaginal exam. I believe this likely represents pelvic recurrence of her cancer.  I performed a history, physical examination, and personally reviewed the patient's imaging films including  The CT of the abdomen and pelvis from 03/02/15.   1/ CA 125 today to evaluate for elevation. 2/ If CA 125 is normal, will repeat imaging in 2 months to evaluate for progression. 3/ If CA 125 is elevated (or increasing from prior evaluation) I recommend either initiating salvage regimen of chemotherapy (eg Doxil) for recurrent, platinum resistant clear cell ovarian cancer. Or, alternatively, patient could elect to hold off chemotherapy until symptomatic, and we would follow expectantly until that time. I discussed with Bessy that it is reasonable to delay chemotherapy until symptoms, as evidence suggests that this is associated with the same survival from cancer and improved QOL.  I do not believe she is a candidate for repeat surgery because of a) such a short platinum free interval, b) poor prognostic cell type (clear cell), c) severe morbid obesity (BMI 42kg/m2) and therefore increased associated comorbidities.   HPI: Debra [name] is a 44 year old para swollen woman who is seen in consultation at the request of Dr. 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 The patient's history began in  October 2015 at which time she underwent surgery with Dr. Steward Ros in [place]. The surgery included an exploratory laparotomy, BSO and debulking of stage IIIc ovarian cancer. The patient reports that Dr. [name] reported that all macroscopic disease was removed at the time of surgery. She healed initially well from her surgery with the exception of development of a pelvic abscess in November 2015 requiring placement of a drain.  She commenced her first cycle of carboplatin and paclitaxel chemotherapy on 08/05/2014. This was administered in [place]. She developed respiratory symptoms and possibly pneumonia after her first treatment and beginning her second treatment. Following this time she was hospitalized and intubated, but was not neutropenic. She had acute respiratory failure and possible pneumonia (per patient). Between December and mid January 2016 she received a total of 2 cycles of Paclitaxel chemotherapy. After that point in time the patient declined additional therapy. It is unclear if she had imaging studies performed immediately after completing chemotherapy. She subsequently established care with the pulmonologist Dr. [name].   She saw Dr. Marko Plume in April 2016 for evaluation. As part of her workup she underwent a CT of the abdomen and pelvis on 11/19/2014. This demonstrated a small hypoattenuating nodule along the vaginal cuff measuring 14 x 8 mm. It was described as possibly a complex or hemorrhagic cyst but enhancement related to metastatic deposit cannot be excluded. No other lesions suspicious for metastatic or recurrent disease were appreciated.  She had followup CT of the abdomen and pelvis on 03/02/15 to follow the vaginal cuff nodule. It revealed: No evidence of recurrence  in the chest. A stable in size 1.4 x 0.8 cm high attenuation lesion at the vaginal cuff. There were 2 new lesions cephalad to this is deep in the central pelvis one measuring 1.9 x 1.5 x 2.4 cm and the  other measuring 1.9 x 1.2 x 1.8 cm. A large ventral hernia continues to be present.  Interval History: She denies vaginal bleeding or abnormal discharge. She denies pelvic pain. She has persistent left lower extremity lymphedema after placement of a pelvic drain for pelvic abscesses in December 2015.  Current Meds:  Outpatient Encounter Prescriptions as of 03/07/2015  Medication Sig  . albuterol (PROAIR HFA) 108 (90 BASE) MCG/ACT inhaler Inhale 2 puffs into the lungs every 4 (four) hours as needed for wheezing or shortness of breath.  . ALPRAZolam (XANAX) 0.5 MG tablet Take 0.5 mg by mouth 2 (two) times daily.  . benzonatate (TESSALON) 100 MG capsule Take 100 mg by mouth 3 (three) times daily as needed for cough.   Marland Kitchen buPROPion (WELLBUTRIN XL) 300 MG 24 hr tablet Take 300 mg by mouth every morning.   . citalopram (CELEXA) 40 MG tablet Take 40 mg by mouth every morning.   Marland Kitchen Dextromethorphan-Guaifenesin (MUCINEX DM) 30-600 MG TB12 Take 2 tablets by mouth every 6 (six) hours as needed (for cough/congestion).  . Fluticasone-Salmeterol (ADVAIR DISKUS) 250-50 MCG/DOSE AEPB Inhale 1 puff into the lungs 2 (two) times daily.  . furosemide (LASIX) 40 MG tablet Take 40 mg by mouth daily.  Marland Kitchen HYDROcodone-acetaminophen (NORCO) 5-325 MG per tablet Take 1-2 tablets by mouth every 6 (six) hours as needed.  Marland Kitchen ipratropium-albuterol (DUONEB) 0.5-2.5 (3) MG/3ML SOLN Take 3 mLs by nebulization every 6 (six) hours as needed (for wheezing. Take three times daily for  next 3 days).  Marland Kitchen levothyroxine (SYNTHROID, LEVOTHROID) 150 MCG tablet Take 150 mcg by mouth daily before breakfast.  . lidocaine-prilocaine (EMLA) cream Apply 1 application topically daily as needed.  . montelukast (SINGULAIR) 10 MG tablet Take 10 mg by mouth at bedtime.   Marland Kitchen morphine (MS CONTIN) 30 MG 12 hr tablet Take 1 tablet (30 mg total) by mouth 2 (two) times daily.  Marland Kitchen morphine (MSIR) 15 MG tablet Take 15 mg by mouth daily as needed for severe pain.  Marland Kitchen  omeprazole (PRILOSEC) 40 MG capsule Take 40 mg by mouth every morning.   . ondansetron (ZOFRAN ODT) 8 MG disintegrating tablet 28m ODT q4 hours prn nausea  . ondansetron (ZOFRAN) 8 MG tablet Take 8 mg by mouth every 8 (eight) hours as needed for nausea or vomiting.   . polyethylene glycol powder (GLYCOLAX/MIRALAX) powder Take 17 g by mouth daily as needed for mild constipation or moderate constipation.   . prochlorperazine (COMPAZINE) 10 MG tablet Take 10 mg by mouth every 6 (six) hours as needed for nausea or vomiting.   . senna (SENOKOT) 8.6 MG TABS tablet Take 8.6 mg by mouth daily.   .Marland Kitchentiotropium (SPIRIVA) 18 MCG inhalation capsule Place 18 mcg into inhaler and inhale daily.  . traZODone (DESYREL) 100 MG tablet Take 100 mg by mouth at bedtime as needed for sleep.    No facility-administered encounter medications on file as of 03/07/2015.    Allergy:  Allergies  Allergen Reactions  . Penicillins Swelling and Rash    Social Hx:   History   Social History  . Marital Status: Divorced    Spouse Name: N/A  . Number of Children: N/A  . Years of Education: N/A   Occupational  History  . Not on file.   Social History Main Topics  . Smoking status: Former Smoker -- 1.00 packs/day for 3 years    Types: Cigarettes    Quit date: 10/19/2011  . Smokeless tobacco: Never Used  . Alcohol Use: Yes     Comment: rarely   . Drug Use: No  . Sexual Activity: No   Other Topics Concern  . Not on file   Social History Narrative   Lives with her mom currently, but she usually rents an apartment with her daughter.  Occasionally uses a cane.        PCP:  Lovenia Shuck             Past Surgical Hx:  Past Surgical History  Procedure Laterality Date  . Knee surgery      x3, two on left and one on right.  Arthoscopy  . Cholecystectomy    . Abdominal hysterectomy    . Cesarean section    . Tonsillectomy    . Joint replacement      2x on left, 1x on right    Past Medical Hx:  Past  Medical History  Diagnosis Date  . Asthma   . Ulcerative colitis   . Anxiety   . Depression   . Chronic back pain   . Chronic knee pain   . Thyroid disease     hypo  . GERD (gastroesophageal reflux disease)   . Insomnia   . Allergic rhinitis   . Arthritis   . COPD (chronic obstructive pulmonary disease)     Past Gynecological History:  C/s x 2  No LMP recorded. Patient has had a hysterectomy.  Family Hx:  Family History  Problem Relation Age of Onset  . High blood pressure Mother   . Bipolar disorder Mother   . Asthma Father   . Parkinson's disease Father     Review of Systems:  Constitutional  Feels well,    ENT Normal appearing ears and nares bilaterally Skin/Breast  No rash, sores, jaundice, itching, dryness Cardiovascular  No chest pain, shortness of breath, or edema  Pulmonary  No cough or wheeze.  Gastro Intestinal  No nausea, vomitting, or diarrhoea. No bright red blood per rectum, no abdominal pain, change in bowel movement, or constipation.  Genito Urinary  No frequency, urgency, dysuria, see HPI Musculo Skeletal  No myalgia, arthralgia, joint swelling or pain  Neurologic  No weakness, numbness, change in gait,  Psychology  No depression, anxiety, insomnia.   Vitals:  Blood pressure 109/73, pulse 85, temperature 98.3 F (36.8 C), temperature source Oral, resp. rate 18, height 5' 2"  (1.575 m), weight 228 lb 8 oz (103.647 kg).  Physical Exam: WD in NAD Neck  Supple NROM, without any enlargements.  Lymph Node Survey No cervical supraclavicular or inguinal adenopathy Cardiovascular  Pulse normal rate, regularity and rhythm. S1 and S2 normal.  Lungs  Clear to auscultation bilateraly, without wheezes/crackles/rhonchi. Good air movement.  Skin  No rash/lesions/breakdown  Psychiatry  Alert and oriented to person, place, and time  Abdomen  Normoactive bowel sounds, abdomen soft, non-tender and obese without evidence of hernia.  Back No CVA  tenderness Genito Urinary  Vulva/vagina: Normal external female genitalia.   No lesions. No discharge or bleeding.  Bladder/urethra:  No lesions or masses, well supported bladder  Vagina: smooth vaginal cuff. Rigidity (2cm) at midline vaginal cuff appreciated,may be consistent with a met. It is mobile and not fixed. There are no corresponding mucosal  lesions.  Cervix: surgically absent  Uterus: surgically absent    Adnexa: no palpable masses. Rectal  Good tone, no masses no cul de sac nodularity.  Extremities  No bilateral cyanosis, clubbing or edema.  25 minutes of face to face counseling was spent with the patient reviewing her scans, including looking at the images, and discussing treatment options.  Donaciano Eva, MD   03/07/2015, 12:31 PM

## 2015-03-08 ENCOUNTER — Telehealth: Payer: Self-pay | Admitting: *Deleted

## 2015-03-08 LAB — CA 125: CA 125: 20 U/mL (ref <35)

## 2015-03-08 NOTE — Telephone Encounter (Signed)
-----   Message from Gordy Levan, MD sent at 03/08/2015  7:15 AM EDT ----- Labs seen and need follow up: Dr Denman George saw patient and requested lab on 7-18, so she needs these results     thanks

## 2015-03-08 NOTE — Telephone Encounter (Signed)
Notified patient of CA125 results as noted below by Dr. Marko Plume. Reviewed schedule for CT scan and appointment with Dr. Denman George both on 9-19 while on the phone - patient wrote down information and is agreeable them. Patient agreeable to pick-up contrast as either [facility] or [facility] anytime prior to scan. Per Dr. Marko Plume, patient does not need f/u appt at this time. Told patient that once CT scan results are back, Dr. Marko Plume with decide on followup. Patient agreeable to this and will call prior to scheduled appts with any additional questions or concerns.

## 2015-04-06 ENCOUNTER — Telehealth: Payer: Self-pay | Admitting: *Deleted

## 2015-04-06 NOTE — Telephone Encounter (Signed)
Received call from patient requesting to review date and times of CT and MD appointments. Reviewed appointments scheduled 05/09/15 with patient in detail. No other questions or concerns noted at this time.

## 2015-05-06 ENCOUNTER — Telehealth: Payer: Self-pay | Admitting: *Deleted

## 2015-05-06 NOTE — Telephone Encounter (Signed)
"  I need to speak with Erasmo Downer about 'chip stuff'.  CT scheduled on Monday.  I may need to reschedule.  Return number [phone], or [phone]."

## 2015-05-06 NOTE — Telephone Encounter (Signed)
Re CT  This is for gyn onc. Please be sure they get this message.  L.Livesay

## 2015-05-06 NOTE — Telephone Encounter (Signed)
Routed to Dr. Denman George and Joylene John NP.

## 2015-05-09 ENCOUNTER — Ambulatory Visit (HOSPITAL_COMMUNITY): Payer: Medicaid Other

## 2015-05-09 ENCOUNTER — Ambulatory Visit: Payer: Medicaid Other | Admitting: Gynecologic Oncology

## 2015-05-10 ENCOUNTER — Telehealth: Payer: Self-pay | Admitting: *Deleted

## 2015-05-10 NOTE — Telephone Encounter (Signed)
Called [facility] regarding CT scan denied.  Rep will call back by close of business 9/21 with final answer regarding scan approval or denial

## 2015-05-18 ENCOUNTER — Other Ambulatory Visit: Payer: Self-pay | Admitting: *Deleted

## 2015-05-18 ENCOUNTER — Telehealth: Payer: Self-pay | Admitting: *Deleted

## 2015-05-18 DIAGNOSIS — C563 Malignant neoplasm of bilateral ovaries: Secondary | ICD-10-CM

## 2015-05-18 DIAGNOSIS — C561 Malignant neoplasm of right ovary: Secondary | ICD-10-CM

## 2015-05-18 DIAGNOSIS — C562 Malignant neoplasm of left ovary: Principal | ICD-10-CM

## 2015-05-18 NOTE — Telephone Encounter (Signed)
Patient scheduled for lab and PAC flush after CT scan 05/19/15 at [facility]. Called patient and she is agreeble to come to the cancer center after CT scan at [facility] for appts. Patient also requesting to move appt with Dr. Denman George on 05/20/15 to later in the afternoon as she has PCP appt in the am. Patient r/s to 1:15pm - patient agreeable to new appt time.

## 2015-05-19 ENCOUNTER — Ambulatory Visit (HOSPITAL_COMMUNITY)
Admission: RE | Admit: 2015-05-19 | Discharge: 2015-05-19 | Disposition: A | Discharge status: Home / Self Care | Payer: Medicaid Other | Source: Ambulatory Visit | Attending: Gynecologic Oncology | Admitting: Gynecologic Oncology

## 2015-05-19 ENCOUNTER — Ambulatory Visit (HOSPITAL_BASED_OUTPATIENT_CLINIC_OR_DEPARTMENT_OTHER): Payer: Medicaid Other

## 2015-05-19 ENCOUNTER — Encounter (HOSPITAL_COMMUNITY): Payer: Self-pay

## 2015-05-19 DIAGNOSIS — E039 Hypothyroidism, unspecified: Secondary | ICD-10-CM | POA: Diagnosis not present

## 2015-05-19 DIAGNOSIS — C562 Malignant neoplasm of left ovary: Secondary | ICD-10-CM | POA: Diagnosis not present

## 2015-05-19 DIAGNOSIS — Z452 Encounter for adjustment and management of vascular access device: Secondary | ICD-10-CM

## 2015-05-19 DIAGNOSIS — C569 Malignant neoplasm of unspecified ovary: Secondary | ICD-10-CM | POA: Insufficient documentation

## 2015-05-19 DIAGNOSIS — K439 Ventral hernia without obstruction or gangrene: Secondary | ICD-10-CM | POA: Diagnosis not present

## 2015-05-19 DIAGNOSIS — C561 Malignant neoplasm of right ovary: Secondary | ICD-10-CM

## 2015-05-19 DIAGNOSIS — C563 Malignant neoplasm of bilateral ovaries: Secondary | ICD-10-CM

## 2015-05-19 DIAGNOSIS — Z95828 Presence of other vascular implants and grafts: Secondary | ICD-10-CM

## 2015-05-19 LAB — CBC WITH DIFFERENTIAL/PLATELET
BASO%: 0.4 % (ref 0.0–2.0)
Basophils Absolute: 0 10*3/uL (ref 0.0–0.1)
EOS%: 3.8 % (ref 0.0–7.0)
Eosinophils Absolute: 0.3 10*3/uL (ref 0.0–0.5)
HCT: 37.8 % (ref 34.8–46.6)
HGB: 11.5 g/dL — ABNORMAL LOW (ref 11.6–15.9)
LYMPH%: 12.1 % — AB (ref 14.0–49.7)
MCH: 25.8 pg (ref 25.1–34.0)
MCHC: 30.4 g/dL — AB (ref 31.5–36.0)
MCV: 84.8 fL (ref 79.5–101.0)
MONO#: 0.3 10*3/uL (ref 0.1–0.9)
MONO%: 3.1 % (ref 0.0–14.0)
NEUT%: 80.6 % — AB (ref 38.4–76.8)
NEUTROS ABS: 6.5 10*3/uL (ref 1.5–6.5)
PLATELETS: 240 10*3/uL (ref 145–400)
RBC: 4.46 10*6/uL (ref 3.70–5.45)
RDW: 16.2 % — ABNORMAL HIGH (ref 11.2–14.5)
WBC: 8.1 10*3/uL (ref 3.9–10.3)
lymph#: 1 10*3/uL (ref 0.9–3.3)

## 2015-05-19 LAB — COMPREHENSIVE METABOLIC PANEL (CC13)
ALBUMIN: 3.8 g/dL (ref 3.5–5.0)
ALK PHOS: 64 U/L (ref 40–150)
ALT: 9 U/L (ref 0–55)
AST: 9 U/L (ref 5–34)
Anion Gap: 9 mEq/L (ref 3–11)
BILIRUBIN TOTAL: 1.1 mg/dL (ref 0.20–1.20)
BUN: 6.6 mg/dL — AB (ref 7.0–26.0)
CALCIUM: 9.1 mg/dL (ref 8.4–10.4)
CO2: 25 mEq/L (ref 22–29)
CREATININE: 1.1 mg/dL (ref 0.6–1.1)
Chloride: 103 mEq/L (ref 98–109)
EGFR: 62 mL/min/{1.73_m2} — ABNORMAL LOW (ref >90)
Glucose: 91 mg/dl (ref 70–140)
Potassium: 3.9 mEq/L (ref 3.5–5.1)
Sodium: 138 mEq/L (ref 136–145)
TOTAL PROTEIN: 7.4 g/dL (ref 6.4–8.3)

## 2015-05-19 MED ORDER — SODIUM CHLORIDE 0.9 % IJ SOLN
10.0000 mL | INTRAMUSCULAR | Status: DC | PRN
  Administered 2015-05-19: 10 mL via INTRAVENOUS
  Filled 2015-05-19: qty 10

## 2015-05-19 MED ORDER — HEPARIN SOD (PORK) LOCK FLUSH 100 UNIT/ML IV SOLN
500.0000 [IU] | Freq: Once | INTRAVENOUS | Status: AC
  Administered 2015-05-19: 500 [IU] via INTRAVENOUS
  Filled 2015-05-19: qty 5

## 2015-05-19 MED ORDER — IOHEXOL 300 MG/ML  SOLN
100.0000 mL | Freq: Once | INTRAMUSCULAR | Status: AC | PRN
  Administered 2015-05-19: 100 mL via INTRAVENOUS

## 2015-05-19 NOTE — Progress Notes (Signed)
Pt reported to the flush room with port accessed from radiology. Pt reports the CT tech did not get blood return. Flushed port with normal saline with ease. Pt reports taste and smell. Per Dr. Denman George nurse- de-access pt and have labs drawn peripherally. Pt does not have time today to have Cathflo instilled. Pt returns tomorrow for an OV with Dr. Denman George she will allow for the Cathflo during this visit if necessary.

## 2015-05-19 NOTE — Patient Instructions (Signed)

## 2015-05-20 ENCOUNTER — Ambulatory Visit: Payer: Medicaid Other | Admitting: Gynecologic Oncology

## 2015-05-20 ENCOUNTER — Telehealth: Payer: Self-pay | Admitting: *Deleted

## 2015-05-20 ENCOUNTER — Telehealth: Payer: Self-pay | Admitting: Gynecologic Oncology

## 2015-05-20 LAB — CA 125: CA 125: 46 U/mL — AB (ref <35)

## 2015-05-20 NOTE — Telephone Encounter (Signed)
Informed patient of CT results and CA 125 elevation. Looks like recurrence of ovarian cancer, multiple locations. Not a surgical candidate given multiple foci, poor health, short platinum free interval (9 months). She is technically platinum sensitive. I will discuss chemotherapeutic options with Dr Marko Plume.  She had a hard time with chemotherapy last time (did not complete adjuvant therapy with taxol and platinum).  Donaciano Eva, MD

## 2015-05-20 NOTE — Telephone Encounter (Signed)
Received call from patient requesting to reschedule appointment for today with Dr. Denman George as she is really tired and not feeling well today. Per pt, she went to her PCP this morning and her doctor made adjustments to her thyroid medication. Patient rescheduled to Monday, 05-23-15, at 1:30pm with Dr. Denman George. Patient agreeable to new appt time and no other concerns voiced at this time.

## 2015-05-23 ENCOUNTER — Ambulatory Visit: Payer: Medicaid Other | Admitting: Gynecologic Oncology

## 2015-05-23 ENCOUNTER — Telehealth: Payer: Self-pay

## 2015-05-23 NOTE — Telephone Encounter (Signed)
Debra [name] left a               vm in Dr. Mariana Kaufman nurse's voicemail stating that she is not feeling well since Friday 05-20-15. She has a lot of chest congestion and diarrhea.  There is no way that she can come to her appointment this afternoon to see Dr. Denman George. Joylene John NP and Chales Abrahams RN. Notified of cancellation and will call her back to follow up with patient.

## 2015-05-24 ENCOUNTER — Other Ambulatory Visit: Payer: Self-pay | Admitting: *Deleted

## 2015-05-25 ENCOUNTER — Telehealth: Payer: Self-pay | Admitting: *Deleted

## 2015-05-25 NOTE — Telephone Encounter (Signed)
Called patient and gave her flush appt for Friday, 05/27/15, prior to MD visits so Cathflo can placed in port-a-cath (no blood return from [facility] on 05/19/15). Patient agreeable to appts for flush, Dr. Denman George, and Dr. Denman George. Reminded patient of the importance that she comes to the appts as scheduled (due to progression on recent CT scan). No other questions or concerns noted at this time.

## 2015-05-27 ENCOUNTER — Encounter: Payer: Self-pay | Admitting: Gynecologic Oncology

## 2015-05-27 ENCOUNTER — Other Ambulatory Visit: Payer: Medicaid Other

## 2015-05-27 ENCOUNTER — Other Ambulatory Visit: Payer: Self-pay | Admitting: *Deleted

## 2015-05-27 ENCOUNTER — Ambulatory Visit: Payer: Medicaid Other

## 2015-05-27 ENCOUNTER — Ambulatory Visit (HOSPITAL_BASED_OUTPATIENT_CLINIC_OR_DEPARTMENT_OTHER): Payer: Medicaid Other | Admitting: Oncology

## 2015-05-27 ENCOUNTER — Ambulatory Visit: Payer: Medicaid Other | Attending: Gynecologic Oncology | Admitting: Gynecologic Oncology

## 2015-05-27 ENCOUNTER — Other Ambulatory Visit: Payer: Self-pay | Admitting: Oncology

## 2015-05-27 ENCOUNTER — Encounter: Payer: Self-pay | Admitting: Oncology

## 2015-05-27 VITALS — BP 116/81 | HR 90 | Temp 97.9°F | Resp 20 | Ht 62.0 in | Wt 234.3 lb

## 2015-05-27 DIAGNOSIS — J209 Acute bronchitis, unspecified: Secondary | ICD-10-CM

## 2015-05-27 DIAGNOSIS — B372 Candidiasis of skin and nail: Secondary | ICD-10-CM | POA: Diagnosis not present

## 2015-05-27 DIAGNOSIS — K029 Dental caries, unspecified: Secondary | ICD-10-CM

## 2015-05-27 DIAGNOSIS — Z6841 Body Mass Index (BMI) 40.0 and over, adult: Secondary | ICD-10-CM

## 2015-05-27 DIAGNOSIS — K089 Disorder of teeth and supporting structures, unspecified: Secondary | ICD-10-CM

## 2015-05-27 DIAGNOSIS — C561 Malignant neoplasm of right ovary: Secondary | ICD-10-CM

## 2015-05-27 DIAGNOSIS — J42 Unspecified chronic bronchitis: Secondary | ICD-10-CM

## 2015-05-27 DIAGNOSIS — Z95828 Presence of other vascular implants and grafts: Secondary | ICD-10-CM

## 2015-05-27 DIAGNOSIS — C569 Malignant neoplasm of unspecified ovary: Secondary | ICD-10-CM | POA: Diagnosis present

## 2015-05-27 DIAGNOSIS — C562 Malignant neoplasm of left ovary: Secondary | ICD-10-CM

## 2015-05-27 DIAGNOSIS — R110 Nausea: Secondary | ICD-10-CM | POA: Diagnosis not present

## 2015-05-27 DIAGNOSIS — Z9981 Dependence on supplemental oxygen: Secondary | ICD-10-CM

## 2015-05-27 DIAGNOSIS — C563 Malignant neoplasm of bilateral ovaries: Secondary | ICD-10-CM

## 2015-05-27 DIAGNOSIS — Z79899 Other long term (current) drug therapy: Secondary | ICD-10-CM

## 2015-05-27 MED ORDER — PROMETHAZINE HCL 25 MG PO TABS: 25.0000 mg | ORAL_TABLET | Freq: Four times a day (QID) | ORAL | Status: DC | PRN

## 2015-05-27 MED ORDER — SODIUM CHLORIDE 0.9 % IJ SOLN
10.0000 mL | INTRAMUSCULAR | Status: DC | PRN
  Administered 2015-05-27: 10 mL via INTRAVENOUS
  Filled 2015-05-27: qty 10

## 2015-05-27 MED ORDER — PROCHLORPERAZINE MALEATE 10 MG PO TABS: 10.0000 mg | ORAL_TABLET | Freq: Four times a day (QID) | ORAL | Status: DC | PRN

## 2015-05-27 MED ORDER — HEPARIN SOD (PORK) LOCK FLUSH 100 UNIT/ML IV SOLN
500.0000 [IU] | Freq: Once | INTRAVENOUS | Status: AC
  Administered 2015-05-27: 500 [IU] via INTRAVENOUS
  Filled 2015-05-27: qty 5

## 2015-05-27 NOTE — Patient Instructions (Signed)
Plan to follow up with Dr. Marko Plume as scheduled.  Plans to begin carboplatin.  Please call for any questions or concerns.  We will make another referral for you to see the genetics counselor.
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
## 2015-05-27 NOTE — Progress Notes (Signed)
Follow-up Note: Gyn-Onc  Consult was initially requested by Dr. Marko Plume for the evaluation of Debra [name] 44 y.o. female  CC:  Chief Complaint  Patient presents with  . Ovarian Cancer    Follow up MD appointment    Assessment/Plan:  Debra [name]  is a 44 y.o.  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 She recurrence at the central pelvis and peritoneal sites. I personally reviewed the films from 05/19/15. I am able to appreciate some fullness/firmness on vaginal exam.   I performed a history, physical examination, and personally reviewed the patient's imaging films including  The CT of the abdomen and pelvis from 05/19/15.   New recurrence (1st recurrence) diagnosed 05/19/15  She is not a surgical candidate due to the relatively short platinum free interval, multifocal peritoneal disease, poor medical comorbidity status. I therefore recommend salvage treatment with single agent carboplatin AUC 5. If she is medically tolerating this well we can make consideration to increase the dosing to AUC 6. I recommend waiting to commence treatment until she has had evaluation of her dental condition. I also recommend waiting until she has completed treatment of her pneumonia/acute bronchitis. Her cancer is minimally symptomatic - I am prescribing phenergan for nausea.  She has cutaneous candida - recommend myconazole powder and provided this for the patient.   HPI: Debra [name] is a 44 year old para swollen woman who is seen in consultation at the request of Dr. 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 The patient's history began in October 2015 at which time she underwent surgery with Dr. Steward Ros in [place]. The surgery included an exploratory laparotomy, BSO and debulking of stage IIIc ovarian cancer. The patient reports that Dr. [name] reported that all macroscopic disease was  removed at the time of surgery. She healed initially well from her surgery with the exception of development of a pelvic abscess in November 2015 requiring placement of a drain.  She commenced her first cycle of carboplatin and paclitaxel chemotherapy on 08/05/2014. This was administered in [place]. She developed respiratory symptoms and possibly pneumonia after her first treatment and beginning her second treatment. Following this time she was hospitalized and intubated, but was not neutropenic. She had acute respiratory failure and possible pneumonia (per patient). Between December and mid January 2016 she received a total of 2 cycles of Paclitaxel chemotherapy. After that point in time the patient declined additional therapy. It is unclear if she had imaging studies performed immediately after completing chemotherapy. She subsequently established care with the pulmonologist Dr. [name].   She saw Dr. Marko Plume in April 2016 for evaluation. As part of her workup she underwent a CT of the abdomen and pelvis on 11/19/2014. This demonstrated a small hypoattenuating nodule along the vaginal cuff measuring 14 x 8 mm. It was described as possibly a complex or hemorrhagic cyst but enhancement related to metastatic deposit cannot be excluded. No other lesions suspicious for metastatic or recurrent disease were appreciated.  She had followup CT of the abdomen and pelvis on 03/02/15 to follow the vaginal cuff nodule. It revealed: No evidence of recurrence in the chest. A stable in size 1.4 x 0.8 cm high attenuation lesion at the vaginal cuff. There were 2 new lesions cephalad to this is deep in the central pelvis one measuring 1.9 x 1.5 x 2.4 cm and the other measuring 1.9 x 1.2 x 1.8 cm. A large ventral hernia  continues to be present.  She has persistent left lower extremity lymphedema after placement of a pelvic drain for pelvic abscesses in December 2015.  Interval History: She has symptoms of intermittent  nausea and diarrhea. She has been diagnosed with pneumonia/acute bronchitis by her Pulmonologist, Dr Myrtha Mantis. She is on prednisone and Levaquin for this. She has severe tooth loss and apparent rotting of teeth.  She had a followup,scheduled CT of the abdomen and pelvis on 05/19/15 which showed: Interval enlargement of right external iliac lymph node measuring 2.3 x 1.7cm previously 1.1 x 1.1 cm. Unchanged 1.1 x 0.8 cm high attenuation lesion along the margin of the vaginal cuff, potentially representing scar tissue or proteinaceous vaginal wall cyst. Extending along the superior posterior margin of vaginal cuff is an enlarging 2.6 x 1.7 cm rim enhancing soft tissue mass previously 1.9 x 1.5 cm. This appears to be located between the rectum and superior aspect of the vaginal cuff was well as posterior wall of the urinary bladder. This may potentially represent a peritoneal implant or fistulous connection between the vaginal cuff, bladder and rectum. Additionally within the superior left aspect of the hemipelvis there is an enlarging peripherally enhancing centrally low attenuating mass measuring 2.5 x 1.8 cm previously 1.9 x1.2 cm. New 1.7 x 2.2 cm enhancing mass within the pelvis associated with the adjacent colon. Enlarging soft tissue mass measuring 0.9 cm along the inferior aspect of the right psoas muscle, previously 0.4 cm.  CA 125 was elevated at 46 on 05/19/15  Current Meds:  Outpatient Encounter Prescriptions as of 05/27/2015  Medication Sig  . albuterol (PROAIR HFA) 108 (90 BASE) MCG/ACT inhaler Inhale 2 puffs into the lungs every 4 (four) hours as needed for wheezing or shortness of breath.  . ALPRAZolam (XANAX) 0.5 MG tablet Take 0.5 mg by mouth 2 (two) times daily.  . benzonatate (TESSALON) 100 MG capsule Take 100 mg by mouth 3 (three) times daily as needed for cough.   Marland Kitchen buPROPion (WELLBUTRIN XL) 300 MG 24 hr tablet Take 300 mg by mouth every morning.   . citalopram (CELEXA) 40 MG tablet  Take 40 mg by mouth every morning.   Marland Kitchen Dextromethorphan-Guaifenesin (MUCINEX DM) 30-600 MG TB12 Take 2 tablets by mouth every 6 (six) hours as needed (for cough/congestion).  . Fluticasone-Salmeterol (ADVAIR) 500-50 MCG/DOSE AEPB Inhale 1 puff into the lungs 2 (two) times daily.  . furosemide (LASIX) 40 MG tablet Take 40 mg by mouth daily.  Marland Kitchen ipratropium-albuterol (DUONEB) 0.5-2.5 (3) MG/3ML SOLN Take 3 mLs by nebulization every 6 (six) hours as needed (for wheezing. Take three times daily for  next 3 days).  Marland Kitchen levofloxacin (LEVAQUIN) 750 MG tablet Take 750 mg by mouth daily.  Marland Kitchen levothyroxine (SYNTHROID, LEVOTHROID) 175 MCG tablet Take 175 mcg by mouth daily before breakfast.  . lidocaine-prilocaine (EMLA) cream Apply 1 application topically daily as needed.  . montelukast (SINGULAIR) 10 MG tablet Take 10 mg by mouth at bedtime.   Marland Kitchen morphine (MS CONTIN) 30 MG 12 hr tablet Take 1 tablet (30 mg total) by mouth 2 (two) times daily.  Marland Kitchen morphine (MSIR) 15 MG tablet Take 15 mg by mouth daily as needed for severe pain.  Marland Kitchen omeprazole (PRILOSEC) 40 MG capsule Take 40 mg by mouth every morning.   . ondansetron (ZOFRAN) 8 MG tablet Take 8 mg by mouth every 8 (eight) hours as needed for nausea or vomiting.   . OXYGEN Inhale 3 L/min into the lungs as needed.  Marland Kitchen  polyethylene glycol powder (GLYCOLAX/MIRALAX) powder Take 17 g by mouth daily as needed for mild constipation or moderate constipation.   . predniSONE (DELTASONE) 10 MG tablet Take 10 mg by mouth daily with breakfast.  . prochlorperazine (COMPAZINE) 10 MG tablet Take 10 mg by mouth every 6 (six) hours as needed for nausea or vomiting.   . senna (SENOKOT) 8.6 MG TABS tablet Take 8.6 mg by mouth daily.   . traZODone (DESYREL) 100 MG tablet Take 100 mg by mouth at bedtime as needed for sleep.    No facility-administered encounter medications on file as of 05/27/2015.    Allergy:  Allergies  Allergen Reactions  . Penicillins Swelling and Rash  .  Sulfamethoxazole-Trimethoprim Nausea Only    Social Hx:   Social History   Social History  . Marital Status: Divorced    Spouse Name: N/A  . Number of Children: N/A  . Years of Education: N/A   Occupational History  . Not on file.   Social History Main Topics  . Smoking status: Former Smoker -- 1.00 packs/day for 3 years    Types: Cigarettes    Quit date: 10/19/2011  . Smokeless tobacco: Never Used  . Alcohol Use: Yes     Comment: rarely   . Drug Use: No  . Sexual Activity: No   Other Topics Concern  . Not on file   Social History Narrative   Lives with her mom currently, but she usually rents an apartment with her daughter.  Occasionally uses a cane.        PCP:  Lovenia Shuck             Past Surgical Hx:  Past Surgical History  Procedure Laterality Date  . Knee surgery      x3, two on left and one on right.  Arthoscopy  . Cholecystectomy    . Abdominal hysterectomy    . Cesarean section    . Tonsillectomy    . Joint replacement      2x on left, 1x on right    Past Medical Hx:  Past Medical History  Diagnosis Date  . Asthma   . Ulcerative colitis   . Anxiety   . Depression   . Chronic back pain   . Chronic knee pain   . Thyroid disease     hypo  . GERD (gastroesophageal reflux disease)   . Insomnia   . Allergic rhinitis   . Arthritis   . COPD (chronic obstructive pulmonary disease) (HCC)     Past Gynecological History:  C/s x 2  No LMP recorded. Patient has had a hysterectomy.  Family Hx:  Family History  Problem Relation Age of Onset  . High blood pressure Mother   . Bipolar disorder Mother   . Asthma Father   . Parkinson's disease Father     Review of Systems:  Constitutional  Feels well,    ENT Normal appearing ears and nares bilaterally Skin/Breast  No rash, sores, jaundice, itching, dryness Cardiovascular  No chest pain, shortness of breath, or edema  Pulmonary  No cough or wheeze.  Gastro Intestinal  No nausea,  vomitting, or diarrhoea. No bright red blood per rectum, no abdominal pain, change in bowel movement, or constipation.  Genito Urinary  No frequency, urgency, dysuria, see HPI Musculo Skeletal  No myalgia, arthralgia, joint swelling or pain  Neurologic  No weakness, numbness, change in gait,  Psychology  No depression, anxiety, insomnia.   Vitals:  Blood pressure  116/81, pulse 90, temperature 97.9 F (36.6 C), temperature source Oral, resp. rate 20, height 5' 2"  (1.575 m), weight 234 lb 4.8 oz (106.278 kg), SpO2 95 %.  Physical Exam: WD in NAD Neck  Supple NROM, without any enlargements.  Lymph Node Survey No cervical supraclavicular or inguinal adenopathy Cardiovascular  Pulse normal rate, regularity and rhythm. S1 and S2 normal.  Lungs  Clear to auscultation bilateraly, without wheezes/crackles/rhonchi. Good air movement.  Skin  Inframammary candidiasis. Psychiatry  Alert and oriented to person, place, and time  Abdomen  Normoactive bowel sounds, abdomen soft, non-tender and obese without evidence of hernia.  Back No CVA tenderness Genito Urinary  Vulva/vagina: Normal external female genitalia.   No lesions. No discharge or bleeding. Labio crural fold candidiasis.  Bladder/urethra:  No lesions or masses, well supported bladder  Vagina: smooth vaginal cuff. Rigidity (2cm) at midline vaginal cuff appreciated,may be consistent with a met. It is mobile and not fixed. Stable. There are no corresponding mucosal lesions.  Cervix: surgically absent  Uterus: surgically absent    Adnexa: no palpable masses. Rectal  Good tone, no masses no cul de sac nodularity.  Extremities  No bilateral cyanosis, clubbing or edema.  25 minutes of face to face counseling was spent with the patient reviewing her scans, including looking at the images, and discussing treatment options.  Donaciano Eva, MD   05/27/2015, 1:17 PM

## 2015-05-30 ENCOUNTER — Telehealth: Payer: Self-pay | Admitting: Oncology

## 2015-05-30 NOTE — Telephone Encounter (Signed)
Called and left a message with all appointments and dental medicine will call the pateint

## 2015-05-31 ENCOUNTER — Encounter (INDEPENDENT_AMBULATORY_CARE_PROVIDER_SITE_OTHER): Payer: Self-pay

## 2015-05-31 ENCOUNTER — Ambulatory Visit (HOSPITAL_COMMUNITY): Payer: Self-pay | Admitting: Dentistry

## 2015-05-31 ENCOUNTER — Encounter (HOSPITAL_COMMUNITY): Payer: Self-pay | Admitting: Dentistry

## 2015-05-31 VITALS — BP 123/71 | HR 81 | Temp 97.8°F

## 2015-05-31 DIAGNOSIS — K0820 Unspecified atrophy of edentulous alveolar ridge: Secondary | ICD-10-CM

## 2015-05-31 DIAGNOSIS — C561 Malignant neoplasm of right ovary: Secondary | ICD-10-CM

## 2015-05-31 DIAGNOSIS — K0530 Chronic periodontitis, unspecified: Secondary | ICD-10-CM

## 2015-05-31 DIAGNOSIS — K0401 Reversible pulpitis: Secondary | ICD-10-CM

## 2015-05-31 DIAGNOSIS — K083 Retained dental root: Secondary | ICD-10-CM

## 2015-05-31 DIAGNOSIS — M264 Malocclusion, unspecified: Secondary | ICD-10-CM

## 2015-05-31 DIAGNOSIS — C563 Malignant neoplasm of bilateral ovaries: Secondary | ICD-10-CM

## 2015-05-31 DIAGNOSIS — K08409 Partial loss of teeth, unspecified cause, unspecified class: Secondary | ICD-10-CM

## 2015-05-31 DIAGNOSIS — K029 Dental caries, unspecified: Secondary | ICD-10-CM

## 2015-05-31 DIAGNOSIS — K045 Chronic apical periodontitis: Secondary | ICD-10-CM

## 2015-05-31 DIAGNOSIS — IMO0002 Reserved for concepts with insufficient information to code with codable children: Secondary | ICD-10-CM

## 2015-05-31 DIAGNOSIS — K0490 Unspecified diseases of pulp and periapical tissues: Secondary | ICD-10-CM

## 2015-05-31 DIAGNOSIS — K036 Deposits [accretions] on teeth: Secondary | ICD-10-CM

## 2015-05-31 DIAGNOSIS — C562 Malignant neoplasm of left ovary: Principal | ICD-10-CM

## 2015-05-31 NOTE — Patient Instructions (Signed)
The patient is to contact Dr. Michela Pitcher for follow-up evaluation of her pulmonary status. Patient will be scheduled for oral surgery once she is cleared by Dr. Geraldo Pitter for the operating procedure with general anesthesia. Patient will then follow-up with Dr. Marko Plume for chemotherapy after adequate healing from the anticipated dental extractions. Dr. Enrique Sack

## 2015-05-31 NOTE — Progress Notes (Signed)
DENTAL CONSULTATION  Date of Consultation:  05/31/2015 Patient Name:   Kalinda Romaniello Date of Birth:   May 08, 1971 Medical Record Number: [id number]  VITALS: BP 123/71 mmHg  Pulse 81  Temp(Src) 97.8 F (36.6 C) (Oral)  CHIEF COMPLAINT: Patient was referred by Dr. Marko Plume for a prechemotherapy dental evaluation.  HPI: Dim Meisinger is a 44 year old female with history of ovarian cancer. The patient underwent surgical resection in October of 2015 in [place] and this was followed followed by 2 cycles of chemotherapy in [place], [place]. Patient subsequently was seen by Dr. Marko Plume for evaluation for additional chemotherapy. Patient referred for a medically necessary prechemotherapy dental protocol examination.  The patient currently has a history of toothache symptoms over the past 2-3 weeks and involves the mandibular anterior teeth.  The pain is described as being sharp and reached an intensity of 10 out of 10, but is currently 9 out of 10 today. The pain has been occurring in an intermittent fashion. The pain is spontaneous at times. The patient last saw a dentist approximately 6 years ago. Patient had several extractions and several dental restorations. This was with Dr. Posey Boyer in [place], [place]. The patient only sees the dentist when she needs to and denies having regular dental care. Patient denies having partial dentures. The patient is interested in having "all my teeth pulled".   PROBLEM LIST: Patient Active Problem List   Diagnosis Date Noted  . Ovarian cancer, bilateral ([place]) 10/24/2014    Priority: High  . Morbid obesity with BMI of 40.0-44.9, adult ([place]) 05/27/2015  . On home oxygen therapy 05/27/2015  . Chronic bronchitis with acute exacerbation ([place]) 05/27/2015  . Poor dentition 05/27/2015  . Complex dental caries 05/27/2015  . Chronic pain due to injury 10/24/2014  . Portacath in place 10/24/2014  . Hx laparoscopic cholecystectomy  10/24/2014  . History of vaginal hysterectomy 10/24/2014  . Thyroid activity decreased 10/24/2014  . Asthma, moderate persistent 10/24/2014  . Obesity, morbid, BMI 40.0-49.9 ([place]) 10/24/2014  . Acute-on-chronic respiratory failure ([place]) 02/27/2014  . Hypokalemia 02/27/2014  . Recurrent pneumonia 12/11/2013  . Anemia 12/10/2013  . COPD exacerbation ([place]) 12/09/2013  . Anxiety state, unspecified 12/09/2013  . HAP (hospital-acquired pneumonia) 05/16/2013  . Hospital-acquired pneumonia 05/16/2013  . COPD (chronic obstructive pulmonary disease) ([place]) 05/16/2013  . Hypoxia 05/16/2013  . Respiratory failure with hypercapnia ([place]) 04/02/2013  . CAP (community acquired pneumonia) 04/02/2013  . Hypothyroidism 04/02/2013  . Leukocytosis 09/19/2012  . Normocytic anemia 09/19/2012  . Streptococcal pneumonia ([place]) 09/19/2012  . Status asthmaticus 09/18/2012  . HCAP (healthcare-associated pneumonia) 09/18/2012  . Acute respiratory failure with hypoxia ([place]) 09/18/2012  . Anxiety   . Depression   . Thyroid disease   . GERD (gastroesophageal reflux disease)   . Insomnia   . Allergic rhinitis   . Arthritis     PMH: Past Medical History  Diagnosis Date  . Asthma   . Ulcerative colitis   . Anxiety   . Depression   . Chronic back pain   . Chronic knee pain   . Thyroid disease     hypo  . GERD (gastroesophageal reflux disease)   . Insomnia   . Allergic rhinitis   . Arthritis   . COPD (chronic obstructive pulmonary disease) ([facility])     PSH: Past Surgical History  Procedure Laterality Date  . Knee surgery      x3, two on left and one on right.  Arthoscopy  . Cholecystectomy    .  Abdominal hysterectomy    . Cesarean section    . Tonsillectomy    . Joint replacement      2x on left, 1x on right    ALLERGIES: Allergies  Allergen Reactions  . Zofran [Ondansetron Hcl]     Headache  . Penicillins Swelling and Rash  . Sulfamethoxazole-Trimethoprim Nausea Only     MEDICATIONS: Current Outpatient Prescriptions  Medication Sig Dispense Refill  . albuterol (PROAIR HFA) 108 (90 BASE) MCG/ACT inhaler Inhale 2 puffs into the lungs every 4 (four) hours as needed for wheezing or shortness of breath. 18 g 0  . ALPRAZolam (XANAX) 0.5 MG tablet Take 0.5 mg by mouth 2 (two) times daily.    . benzonatate (TESSALON) 100 MG capsule Take 100 mg by mouth 3 (three) times daily as needed for cough.   3  . buPROPion (WELLBUTRIN XL) 300 MG 24 hr tablet Take 300 mg by mouth every morning.     . citalopram (CELEXA) 40 MG tablet Take 40 mg by mouth every morning.     Marland Kitchen Dextromethorphan-Guaifenesin (MUCINEX DM) 30-600 MG TB12 Take 2 tablets by mouth every 6 (six) hours as needed (for cough/congestion).    . Fluticasone-Salmeterol (ADVAIR) 500-50 MCG/DOSE AEPB Inhale 1 puff into the lungs 2 (two) times daily.    . furosemide (LASIX) 40 MG tablet Take 40 mg by mouth daily.    Marland Kitchen ipratropium-albuterol (DUONEB) 0.5-2.5 (3) MG/3ML SOLN Take 3 mLs by nebulization every 6 (six) hours as needed (for wheezing. Take three times daily for  next 3 days). 360 mL 0  . levofloxacin (LEVAQUIN) 750 MG tablet Take 750 mg by mouth daily.    Marland Kitchen levothyroxine (SYNTHROID, LEVOTHROID) 175 MCG tablet Take 175 mcg by mouth daily before breakfast.    . lidocaine-prilocaine (EMLA) cream Apply 1 application topically daily as needed.    . montelukast (SINGULAIR) 10 MG tablet Take 10 mg by mouth at bedtime.     Marland Kitchen morphine (MS CONTIN) 30 MG 12 hr tablet Take 1 tablet (30 mg total) by mouth 2 (two) times daily. 60 tablet 0  . morphine (MSIR) 15 MG tablet Take 15 mg by mouth daily as needed for severe pain.    Marland Kitchen omeprazole (PRILOSEC) 40 MG capsule Take 40 mg by mouth every morning.     . ondansetron (ZOFRAN) 8 MG tablet Take 8 mg by mouth every 8 (eight) hours as needed for nausea or vomiting.     . OXYGEN Inhale 3 L/min into the lungs as needed.    . polyethylene glycol powder (GLYCOLAX/MIRALAX) powder  Take 17 g by mouth daily as needed for mild constipation or moderate constipation.     . predniSONE (DELTASONE) 10 MG tablet Take 10 mg by mouth daily with breakfast.    . prochlorperazine (COMPAZINE) 10 MG tablet Take 1 tablet (10 mg total) by mouth every 6 (six) hours as needed for nausea or vomiting. 30 tablet 1  . promethazine (PHENERGAN) 25 MG tablet Take 1 tablet (25 mg total) by mouth every 6 (six) hours as needed for nausea or vomiting. 30 tablet 1  . senna (SENOKOT) 8.6 MG TABS tablet Take 8.6 mg by mouth daily.     . traZODone (DESYREL) 100 MG tablet Take 100 mg by mouth at bedtime as needed for sleep.      No current facility-administered medications for this visit.    LABS: Lab Results  Component Value Date   WBC 8.1 05/19/2015   HGB 11.5* 05/19/2015  HCT 37.8 05/19/2015   MCV 84.8 05/19/2015   PLT 240 05/19/2015      Component Value Date/Time   NA 138 05/19/2015 0921   NA 140 01/17/2015 1330   K 3.9 05/19/2015 0921   K 3.5 01/17/2015 1330   CL 108 01/17/2015 1330   CO2 25 05/19/2015 0921   CO2 22 01/17/2015 1330   GLUCOSE 91 05/19/2015 0921   GLUCOSE 86 01/17/2015 1330   BUN 6.6* 05/19/2015 0921   BUN 8 01/17/2015 1330   CREATININE 1.1 05/19/2015 0921   CREATININE 1.05* 01/17/2015 1330   CALCIUM 9.1 05/19/2015 0921   CALCIUM 8.9 01/17/2015 1330   GFRNONAA >60 01/17/2015 1330   GFRAA >60 01/17/2015 1330   No results found for: INR, PROTIME No results found for: PTT  SOCIAL HISTORY: Social History   Social History  . Marital Status: Divorced    Spouse Name: N/A  . Number of Children: N/A  . Years of Education: N/A   Occupational History  . Not on file.   Social History Main Topics  . Smoking status: Former Smoker -- 1.00 packs/day for 3 years    Types: Cigarettes    Quit date: 10/19/2011  . Smokeless tobacco: Never Used  . Alcohol Use: Yes     Comment: rarely   . Drug Use: No  . Sexual Activity: No   Other Topics Concern  . Not on file    Social History Narrative   Lives with her mom currently, but she usually rents an apartment with her daughter.  Occasionally uses a cane.        PCP:  Lovenia Shuck             FAMILY HISTORY: Family History  Problem Relation Age of Onset  . High blood pressure Mother   . Bipolar disorder Mother   . Asthma Father   . Parkinson's disease Father     REVIEW OF SYSTEMS: Reviewed with the patient and included in dental record.  DENTAL HISTORY: CHIEF COMPLAINT: Patient was referred by Dr. Marko Plume for a prechemotherapy dental evaluation.  HPI: Sheronica Corey is a 44 year old female with history of ovarian cancer. The patient underwent surgical resection in October of 2015 in [place] and this was followed followed by 2 cycles of chemotherapy in [place], [place]. Patient subsequently was seen by Dr. Marko Plume for evaluation for additional chemotherapy. Patient referred for a medically necessary prechemotherapy dental protocol examination.  The patient currently has a history of toothache symptoms over the past 2-3 weeks and involves the mandibular anterior teeth.  The pain is described as being sharp and reached an intensity of 10 out of 10, but is currently 9 out of 10 today. The pain has been occurring in an intermittent fashion. The pain is spontaneous at times. The patient last saw a dentist approximately 6 years ago. Patient had several extractions and several dental restorations. This was with Dr. Posey Boyer in [place], [place]. The patient only sees the dentist when she needs to and denies having regular dental care. Patient denies having partial dentures. The patient is interested in having "all my teeth pulled".   DENTAL EXAMINATION: GENERAL: The patient is well-developed, well-nourished female in no acute distress. HEAD AND NECK: There is no palpable submandibular lymphadenopathy. The patient denies having acute TMJ symptoms.  INTRAORAL EXAM: The patient has  incipient xerostomia. There is no evidence of oral abscess formation. There is atrophy of the edentulous alveolar ridges. DENTITION: The patient is missing  tooth numbers 1, 3, 15, 16, 17, 18, 19, 30, 31, and 32. There are multiple retained roots in the area of tooth numbers 2, 4, 5, 6, 7, 8, 10, 11, 12, 20, and 29.  PERIODONTAL: The patient has chronic periodontitis with plaque and calculus accumulations, gingival recession, and incipient tooth mobility.  DENTAL CARIES/SUBOPTIMAL RESTORATIONS: There are rampant dental caries affecting all remaining teeth. ENDODONTIC: The patient is complaining of acute pulpitis symptoms. Multiple areas of periapical pathology and radiolucency are noted.  CROWN AND BRIDGE: There are no crown or bridge restorations.  PROSTHODONTIC: Patient denies having partial dentures.  OCCLUSION:Patient has a poor occlusal scheme secondary to multiple missing teeth, supra-eruption and drifting of the unopposed teeth into the edentulous areas, multiple retained root segments, and lack of replacement of missing teeth with dental prostheses.  RADIOGRAPHIC INTERPRETATION: An orthopantogram was taken and supplemented with 12 periapical radiographs. There are multiple missing teeth. There are multiple retained root segments. There are multiple dental caries noted. There are multiple areas of periapical pathology and radiolucency. There is supra-eruption and drifting of the unopposed teeth into the edentulous areas. There is atrophy of the edentulous alveolar ridges. A poor occlusal scheme is noted.    ASSESSMENTS: 1. Ovarian cancer with anticipated chemotherapy 2. Prechemotherapy dental protocol examination 3. Acute pulpitis 4. Chronic apical periodontitis 5. Rampant dental caries 6. Multiple retained root segments 7. Multiple missing teeth 8. Supra-eruption and drifting of the unopposed teeth into the edentulous areas 9. No history of partial dentures 10. Atrophy of the  edentulous alveolar ridges. 11. Poor occlusal scheme and malocclusion 12. Significant respiratory compromise and need for preop clearance by the pulmonologist. 13. Risk for complications up to and including death with anticipated invasive dental procedures in the operating room with general anesthesia due to overall medical compromised state.   PLAN/RECOMMENDATIONS: 1. I discussed the risks, benefits, and complications of various treatment options with the patient in relationship to  her medical and dental conditions, anticipated chemotherapy, and respiratory compromise. We discussed various treatment options to include no treatment, multiple extractions with alveoloplasty, pre-prosthetic surgery as indicated, periodontal therapy, dental restorations, root canal therapy, crown and bridge therapy, implant therapy, and replacement of missing teeth as indicated. The patient currently wishes to proceed with extraction of remaining teeth with alveoloplasty in the operating room with general anesthesia. Patient will need to be cleared by the pulmonologist, Dr. [place], prior to scheduling of the operating room procedure. Patient will then follow-up with Dr. Marko Plume for chemotherapy after adequate healing from the anticipated dental extractions. Patient will also then follow up with the dentist of her choice for fabrication of upper and lower complete dentures after adequate healing and in coordination with anticipated chemotherapy.   2. Discussion of findings with medical team and coordination of future medical and dental care as needed.  I spent in excess of  120 minutes during the conduct of this consultation and >50% of this time involved direct face-to-face encounter for counseling and/or coordination of the patient's care.   Lenn Cal, DDS

## 2015-06-08 ENCOUNTER — Other Ambulatory Visit (HOSPITAL_COMMUNITY): Payer: Self-pay | Admitting: Dentistry

## 2015-06-10 ENCOUNTER — Telehealth: Payer: Self-pay

## 2015-06-10 ENCOUNTER — Telehealth: Payer: Self-pay | Admitting: Oncology

## 2015-06-10 NOTE — Telephone Encounter (Signed)
Told  Ms. Guile that this nurse reviewed her appointments with Dr. Marko Plume for week of 06-13-15.  Since Ms. Wessner has seen Pulmonologist and to have dental extractions 06-14-15, Dr. Marko Plume does not need to see her on 10-24.  The chemotherapy appointment for 06-16-15 will also be cancelled. She will follow up with Dr. Marko Plume on 06-23-15 at 1330 for lab and 1400 for visit. The genetic counseling appointment for 06-14-15 with also be cancelled and rescheduled to mid to late November as she will have had general anesthesia and complete dental extraction that morning. Ms. Watson verbalized understanding. POF sent to schedulers to reschedule Genetic Counseling  appointment.

## 2015-06-10 NOTE — Telephone Encounter (Signed)
Appointments moved from 10/25 to 11/17 due to surgery,patient aware

## 2015-06-13 ENCOUNTER — Encounter (HOSPITAL_COMMUNITY): Payer: Self-pay

## 2015-06-13 ENCOUNTER — Other Ambulatory Visit: Payer: Medicaid Other

## 2015-06-13 ENCOUNTER — Encounter (INDEPENDENT_AMBULATORY_CARE_PROVIDER_SITE_OTHER): Payer: Self-pay

## 2015-06-13 ENCOUNTER — Ambulatory Visit: Payer: Medicaid Other | Admitting: Oncology

## 2015-06-13 ENCOUNTER — Encounter (HOSPITAL_COMMUNITY)
Admission: RE | Admit: 2015-06-13 | Discharge: 2015-06-13 | Disposition: A | Discharge status: Home / Self Care | Payer: Medicaid Other | Source: Ambulatory Visit | Attending: Dentistry | Admitting: Dentistry

## 2015-06-13 DIAGNOSIS — C562 Malignant neoplasm of left ovary: Secondary | ICD-10-CM | POA: Insufficient documentation

## 2015-06-13 DIAGNOSIS — C561 Malignant neoplasm of right ovary: Secondary | ICD-10-CM | POA: Insufficient documentation

## 2015-06-13 DIAGNOSIS — Z01818 Encounter for other preprocedural examination: Secondary | ICD-10-CM | POA: Insufficient documentation

## 2015-06-13 DIAGNOSIS — K0530 Chronic periodontitis, unspecified: Secondary | ICD-10-CM | POA: Insufficient documentation

## 2015-06-13 HISTORY — DX: Malignant (primary) neoplasm, unspecified: C80.1

## 2015-06-13 HISTORY — DX: Other lesions of oral mucosa: K13.79

## 2015-06-13 LAB — BASIC METABOLIC PANEL
ANION GAP: 9 (ref 5–15)
BUN: 7 mg/dL (ref 6–20)
CALCIUM: 9.3 mg/dL (ref 8.9–10.3)
CO2: 25 mmol/L (ref 22–32)
Chloride: 103 mmol/L (ref 101–111)
Creatinine, Ser: 0.89 mg/dL (ref 0.44–1.00)
GFR calc Af Amer: >60 mL/min (ref >60)
GFR calc non Af Amer: >60 mL/min (ref >60)
GLUCOSE: 88 mg/dL (ref 65–99)
POTASSIUM: 3.9 mmol/L (ref 3.5–5.1)
Sodium: 137 mmol/L (ref 135–145)

## 2015-06-13 LAB — CBC
HEMATOCRIT: 37.9 % (ref 36.0–46.0)
Hemoglobin: 11.7 g/dL — ABNORMAL LOW (ref 12.0–15.0)
MCH: 26.5 pg (ref 26.0–34.0)
MCHC: 30.9 g/dL (ref 30.0–36.0)
MCV: 85.7 fL (ref 78.0–100.0)
Platelets: 246 10*3/uL (ref 150–400)
RBC: 4.42 MIL/uL (ref 3.87–5.11)
RDW: 16.6 % — AB (ref 11.5–15.5)
WBC: 6.3 10*3/uL (ref 4.0–10.5)

## 2015-06-13 NOTE — Pre-Procedure Instructions (Addendum)
06-13-15 Ekg done today. 06-13-15 Dr. Delma Post reviewed chart, will see preop AM of. Pt has pulmonary clearance with chart.- Dr. Michela Pitcher 06-06-15 .

## 2015-06-13 NOTE — Patient Instructions (Signed)
[place]  06/13/2015   Your procedure is scheduled on:   06-14-2015 Tuesday  Enter through [facility]  Entrance and follow signs to [facility]. Arrive at     0530   AM.  (Limit 1 person with you).  Call this number if you have problems the morning of surgery: [phone]  Or Presurgical Testing [phone].   For Living Will and/or Health Care Power Attorney Forms: please provide copy for your medical record,may bring AM of surgery(Forms should be already notarized -we do not provide this service).(06-13-15  No information preferred today)   Do not eat food/ or drink: After Midnight.      Take these medicines the morning of surgery with A SIP OF WATER-   (DO NOT TAKE ANY DIABETIC MEDS AM OF SURGERY) : Alprazolam.Bupropion.Citalopram. Levothyroxine, Prednisone. Omeprazole. Pain meds-usual. Nebulizer -usual. Use/ bring Inhalers. Oxygen as usual.   Do not wear jewelry, make-up or nail polish.  Do not wear deodorant, lotions, powders, or perfumes.   Do not shave legs and under arms- 48 hours(2 days) prior to first CHG shower.(Shaving face and neck okay.)  Do not bring valuables to the hospital.(Hospital is not responsible for lost valuables).  Contacts, dentures or removable bridgework, body piercing, hair pins may not be worn into surgery.  Leave suitcase in the car. After surgery it may be brought to your room.  For patients admitted to the hospital, checkout time is 11:00 AM the day of discharge.(Restricted visitors-Any Persons displaying flu-like symptoms or illness).    Patients discharged the day of surgery will not be allowed to drive home. Must have responsible person with you x 24 hours once discharged.  Name and phone number of your driver: Latrice Storlie [phone] cell     Please read over the following fact sheets that you were given:  CHG(Chlorhexidine Gluconate 4% Surgical Soap) use, MRSA Information, Blood Transfusion fact sheet, Incentive  Spirometry Instruction.          [facility] - Preparing for Surgery Before surgery, you can play an important role.  Because skin is not sterile, your skin needs to be as free of germs as possible.  You can reduce the number of germs on your skin by washing with CHG (chlorahexidine gluconate) soap before surgery.  CHG is an antiseptic cleaner which kills germs and bonds with the skin to continue killing germs even after washing. Please DO NOT use if you have an allergy to CHG or antibacterial soaps.  If your skin becomes reddened/irritated stop using the CHG and inform your nurse when you arrive at Short Stay. Do not shave (including legs and underarms) for at least 48 hours prior to the first CHG shower.  You may shave your face/neck. Please follow these instructions carefully:  1.  Shower with CHG Soap the night before surgery and the  morning of Surgery.  2.  If you choose to wash your hair, wash your hair first as usual with your  normal  shampoo.  3.  After you shampoo, rinse your hair and body thoroughly to remove the  shampoo.                           4.  Use CHG as you would any other liquid soap.  You can apply chg directly  to the skin and wash  Gently with a scrungie or clean washcloth.  5.  Apply the CHG Soap to your body ONLY FROM THE NECK DOWN.   Do not use on face/ open                           Wound or open sores. Avoid contact with eyes, ears mouth and genitals (private parts).                       Wash face,  Genitals (private parts) with your normal soap.             6.  Wash thoroughly, paying special attention to the area where your surgery  will be performed.  7.  Thoroughly rinse your body with warm water from the neck down.  8.  DO NOT shower/wash with your normal soap after using and rinsing off  the CHG Soap.                9.  Pat yourself dry with a clean towel.            10.  Wear clean pajamas.            11.  Place clean sheets on your  bed the night of your first shower and do not  sleep with pets. Day of Surgery : Do not apply any lotions/deodorants the morning of surgery.  Please wear clean clothes to the hospital/surgery center.  FAILURE TO FOLLOW THESE INSTRUCTIONS MAY RESULT IN THE CANCELLATION OF YOUR SURGERY PATIENT SIGNATURE_________________________________  NURSE SIGNATURE__________________________________  ________________________________________________________________________

## 2015-06-14 ENCOUNTER — Ambulatory Visit (HOSPITAL_COMMUNITY)
Admission: RE | Admit: 2015-06-14 | Discharge: 2015-06-14 | Disposition: A | Discharge status: Home / Self Care | Payer: Medicaid Other | Source: Ambulatory Visit | Attending: Dentistry | Admitting: Dentistry

## 2015-06-14 ENCOUNTER — Encounter (HOSPITAL_COMMUNITY)
Admission: RE | Disposition: A | Discharge status: Home / Self Care | Payer: Self-pay | Source: Ambulatory Visit | Attending: Dentistry

## 2015-06-14 ENCOUNTER — Ambulatory Visit (HOSPITAL_COMMUNITY): Payer: Medicaid Other | Admitting: Registered Nurse

## 2015-06-14 ENCOUNTER — Encounter: Payer: Medicaid Other | Admitting: Genetic Counselor

## 2015-06-14 ENCOUNTER — Other Ambulatory Visit: Payer: Medicaid Other

## 2015-06-14 ENCOUNTER — Encounter (HOSPITAL_COMMUNITY): Payer: Self-pay | Admitting: *Deleted

## 2015-06-14 DIAGNOSIS — Z87891 Personal history of nicotine dependence: Secondary | ICD-10-CM | POA: Diagnosis not present

## 2015-06-14 DIAGNOSIS — K083 Retained dental root: Secondary | ICD-10-CM | POA: Diagnosis present

## 2015-06-14 DIAGNOSIS — F329 Major depressive disorder, single episode, unspecified: Secondary | ICD-10-CM | POA: Diagnosis not present

## 2015-06-14 DIAGNOSIS — C562 Malignant neoplasm of left ovary: Secondary | ICD-10-CM

## 2015-06-14 DIAGNOSIS — F419 Anxiety disorder, unspecified: Secondary | ICD-10-CM | POA: Insufficient documentation

## 2015-06-14 DIAGNOSIS — Z79899 Other long term (current) drug therapy: Secondary | ICD-10-CM | POA: Insufficient documentation

## 2015-06-14 DIAGNOSIS — K045 Chronic apical periodontitis: Secondary | ICD-10-CM | POA: Diagnosis not present

## 2015-06-14 DIAGNOSIS — C569 Malignant neoplasm of unspecified ovary: Secondary | ICD-10-CM | POA: Insufficient documentation

## 2015-06-14 DIAGNOSIS — J45909 Unspecified asthma, uncomplicated: Secondary | ICD-10-CM | POA: Insufficient documentation

## 2015-06-14 DIAGNOSIS — J449 Chronic obstructive pulmonary disease, unspecified: Secondary | ICD-10-CM | POA: Insufficient documentation

## 2015-06-14 DIAGNOSIS — Z7952 Long term (current) use of systemic steroids: Secondary | ICD-10-CM | POA: Insufficient documentation

## 2015-06-14 DIAGNOSIS — Z6841 Body Mass Index (BMI) 40.0 and over, adult: Secondary | ICD-10-CM | POA: Insufficient documentation

## 2015-06-14 DIAGNOSIS — C563 Malignant neoplasm of bilateral ovaries: Secondary | ICD-10-CM

## 2015-06-14 DIAGNOSIS — K029 Dental caries, unspecified: Secondary | ICD-10-CM | POA: Diagnosis not present

## 2015-06-14 DIAGNOSIS — C561 Malignant neoplasm of right ovary: Secondary | ICD-10-CM

## 2015-06-14 HISTORY — PX: MULTIPLE EXTRACTIONS WITH ALVEOLOPLASTY: SHX5342

## 2015-06-14 SURGERY — MULTIPLE EXTRACTION WITH ALVEOLOPLASTY
Anesthesia: General | Site: Mouth

## 2015-06-14 MED ORDER — SUGAMMADEX SODIUM 200 MG/2ML IV SOLN
INTRAVENOUS | Status: AC
  Filled 2015-06-14: qty 2

## 2015-06-14 MED ORDER — DEXAMETHASONE SODIUM PHOSPHATE 10 MG/ML IJ SOLN
INTRAMUSCULAR | Status: AC
  Filled 2015-06-14: qty 1

## 2015-06-14 MED ORDER — OXYCODONE-ACETAMINOPHEN 5-325 MG PO TABS
1.0000 | ORAL_TABLET | ORAL | Status: DC | PRN
  Administered 2015-06-14: 1 via ORAL
  Filled 2015-06-14: qty 1

## 2015-06-14 MED ORDER — FENTANYL CITRATE (PF) 100 MCG/2ML IJ SOLN
25.0000 ug | INTRAMUSCULAR | Status: DC | PRN
  Administered 2015-06-14 (×2): 50 ug via INTRAVENOUS

## 2015-06-14 MED ORDER — CEFAZOLIN SODIUM-DEXTROSE 2-3 GM-% IV SOLR
INTRAVENOUS | Status: AC
  Filled 2015-06-14: qty 50

## 2015-06-14 MED ORDER — PROPOFOL 10 MG/ML IV BOLUS
INTRAVENOUS | Status: DC | PRN
  Administered 2015-06-14: 200 mg via INTRAVENOUS

## 2015-06-14 MED ORDER — LACTATED RINGERS IV SOLN: INTRAVENOUS | Status: DC

## 2015-06-14 MED ORDER — SUGAMMADEX SODIUM 200 MG/2ML IV SOLN
INTRAVENOUS | Status: DC | PRN
  Administered 2015-06-14: 200 mg via INTRAVENOUS

## 2015-06-14 MED ORDER — LIDOCAINE HCL (CARDIAC) 20 MG/ML IV SOLN
INTRAVENOUS | Status: DC | PRN
  Administered 2015-06-14: 100 mg via INTRAVENOUS

## 2015-06-14 MED ORDER — DEXAMETHASONE SODIUM PHOSPHATE 10 MG/ML IJ SOLN
INTRAMUSCULAR | Status: DC | PRN
  Administered 2015-06-14: 10 mg via INTRAVENOUS

## 2015-06-14 MED ORDER — CEFAZOLIN SODIUM-DEXTROSE 2-3 GM-% IV SOLR
2.0000 g | Freq: Once | INTRAVENOUS | Status: AC
  Administered 2015-06-14: 2 g via INTRAVENOUS

## 2015-06-14 MED ORDER — ISOPROPYL ALCOHOL 70 % SOLN
Status: AC
  Filled 2015-06-14: qty 480

## 2015-06-14 MED ORDER — HYDROMORPHONE HCL 2 MG/ML IJ SOLN
INTRAMUSCULAR | Status: AC
  Filled 2015-06-14: qty 1

## 2015-06-14 MED ORDER — SUCCINYLCHOLINE CHLORIDE 20 MG/ML IJ SOLN
INTRAMUSCULAR | Status: DC | PRN
  Administered 2015-06-14: 100 mg via INTRAVENOUS

## 2015-06-14 MED ORDER — MIDAZOLAM HCL 2 MG/2ML IJ SOLN
INTRAMUSCULAR | Status: AC
  Filled 2015-06-14: qty 4

## 2015-06-14 MED ORDER — OXYMETAZOLINE HCL 0.05 % NA SOLN
NASAL | Status: AC
  Filled 2015-06-14: qty 15

## 2015-06-14 MED ORDER — LIDOCAINE-EPINEPHRINE 2 %-1:100000 IJ SOLN
INTRAMUSCULAR | Status: DC | PRN
  Administered 2015-06-14: 8.5 mL via INTRADERMAL

## 2015-06-14 MED ORDER — OXYCODONE-ACETAMINOPHEN 5-325 MG PO TABS: ORAL_TABLET | ORAL | Status: DC

## 2015-06-14 MED ORDER — PROPOFOL 10 MG/ML IV BOLUS
INTRAVENOUS | Status: AC
  Filled 2015-06-14: qty 20

## 2015-06-14 MED ORDER — FENTANYL CITRATE (PF) 250 MCG/5ML IJ SOLN
INTRAMUSCULAR | Status: AC
  Filled 2015-06-14: qty 25

## 2015-06-14 MED ORDER — FENTANYL CITRATE (PF) 100 MCG/2ML IJ SOLN
INTRAMUSCULAR | Status: DC | PRN
  Administered 2015-06-14 (×3): 50 ug via INTRAVENOUS
  Administered 2015-06-14: 100 ug via INTRAVENOUS

## 2015-06-14 MED ORDER — MEPERIDINE HCL 50 MG/ML IJ SOLN: 6.2500 mg | INTRAMUSCULAR | Status: DC | PRN

## 2015-06-14 MED ORDER — MIDAZOLAM HCL 5 MG/5ML IJ SOLN
INTRAMUSCULAR | Status: DC | PRN
  Administered 2015-06-14: 2 mg via INTRAVENOUS

## 2015-06-14 MED ORDER — FENTANYL CITRATE (PF) 100 MCG/2ML IJ SOLN
INTRAMUSCULAR | Status: AC
  Filled 2015-06-14: qty 2

## 2015-06-14 MED ORDER — LACTATED RINGERS IV SOLN
INTRAVENOUS | Status: DC | PRN
  Administered 2015-06-14 (×2): via INTRAVENOUS

## 2015-06-14 MED ORDER — ROCURONIUM BROMIDE 100 MG/10ML IV SOLN
INTRAVENOUS | Status: DC | PRN
  Administered 2015-06-14: 20 mg via INTRAVENOUS

## 2015-06-14 MED ORDER — LIDOCAINE-EPINEPHRINE 2 %-1:100000 IJ SOLN
INTRAMUSCULAR | Status: AC
  Filled 2015-06-14: qty 13.6

## 2015-06-14 MED ORDER — BUPIVACAINE-EPINEPHRINE (PF) 0.5% -1:200000 IJ SOLN
INTRAMUSCULAR | Status: DC | PRN
  Administered 2015-06-14: 3.6 mL via PERINEURAL

## 2015-06-14 MED ORDER — HYDROMORPHONE HCL 1 MG/ML IJ SOLN
INTRAMUSCULAR | Status: DC | PRN
  Administered 2015-06-14 (×2): 1 mg via INTRAVENOUS

## 2015-06-14 MED ORDER — BUPIVACAINE-EPINEPHRINE (PF) 0.5% -1:200000 IJ SOLN
INTRAMUSCULAR | Status: AC
  Filled 2015-06-14: qty 14.4

## 2015-06-14 MED ORDER — PROMETHAZINE HCL 25 MG/ML IJ SOLN: 6.2500 mg | INTRAMUSCULAR | Status: DC | PRN

## 2015-06-14 MED ORDER — ISOPROPYL ALCOHOL 70 % SOLN
Status: DC | PRN
  Administered 2015-06-14: 1 via TOPICAL

## 2015-06-14 MED ORDER — LIDOCAINE HCL (CARDIAC) 20 MG/ML IV SOLN
INTRAVENOUS | Status: AC
  Filled 2015-06-14: qty 5

## 2015-06-14 SURGICAL SUPPLY — 29 items
ATTRACTOMAT 16X20 MAGNETIC DRP (DRAPES) ×3 IMPLANT
BAG SPEC THK2 15X12 ZIP CLS (MISCELLANEOUS)
BAG ZIPLOCK 12X15 (MISCELLANEOUS) IMPLANT
BANDAGE EYE OVAL (MISCELLANEOUS) IMPLANT
BLADE SURG 15 STRL LF DISP TIS (BLADE) ×2 IMPLANT
BLADE SURG 15 STRL SS (BLADE) ×6
CANNULA VESSEL W/WING WO/VALVE (CANNULA) ×3 IMPLANT
GAUZE SPONGE 4X4 12PLY STRL (GAUZE/BANDAGES/DRESSINGS) ×3 IMPLANT
GAUZE SPONGE 4X4 16PLY XRAY LF (GAUZE/BANDAGES/DRESSINGS) ×3 IMPLANT
GLOVE BIOGEL PI IND STRL 6 (GLOVE) ×1 IMPLANT
GLOVE BIOGEL PI INDICATOR 6 (GLOVE) ×2
GLOVE SURG ORTHO 8.0 STRL STRW (GLOVE) ×3 IMPLANT
GLOVE SURG SS PI 6.0 STRL IVOR (GLOVE) ×3 IMPLANT
GOWN STRL REUS W/TWL 2XL LVL3 (GOWN DISPOSABLE) ×3 IMPLANT
GOWN STRL REUS W/TWL LRG LVL3 (GOWN DISPOSABLE) ×3 IMPLANT
KIT BASIN OR (CUSTOM PROCEDURE TRAY) ×3 IMPLANT
NS IRRIG 1000ML POUR BTL (IV SOLUTION) ×3 IMPLANT
PACK EENT SPLIT (PACKS) ×3 IMPLANT
PACKING VAGINAL (PACKING) ×3 IMPLANT
SPONGE SURGIFOAM ABS GEL 100 (HEMOSTASIS) ×3 IMPLANT
SUCTION FRAZIER 12FR DISP (SUCTIONS) IMPLANT
SUT CHROMIC 3 0 PS 2 (SUTURE) ×12 IMPLANT
SUT CHROMIC 4 0 P 3 18 (SUTURE) IMPLANT
SYR 50ML LL SCALE MARK (SYRINGE) ×3 IMPLANT
TOWEL OR 17X26 10 PK STRL BLUE (TOWEL DISPOSABLE) ×3 IMPLANT
TUBING CONNECTING 10 (TUBING) ×2 IMPLANT
TUBING CONNECTING 10' (TUBING) ×1
WATER STERILE IRR 1500ML POUR (IV SOLUTION) ×3 IMPLANT
YANKAUER SUCT BULB TIP NO VENT (SUCTIONS) ×3 IMPLANT

## 2015-06-14 NOTE — Discharge Instructions (Signed)

## 2015-06-14 NOTE — Anesthesia Preprocedure Evaluation (Addendum)
Anesthesia Evaluation  Patient identified by MRN, date of birth, ID band Patient awake    Reviewed: Allergy & Precautions, NPO status , Patient's Chart, lab work & pertinent test results  Airway Mallampati: II  TM Distance: >3 FB Neck ROM: Full    Dental no notable dental hx. (+) Poor Dentition, Missing, Loose   Pulmonary asthma , COPD, former smoker,    Pulmonary exam normal breath sounds clear to auscultation       Cardiovascular negative cardio ROS Normal cardiovascular exam Rhythm:Regular Rate:Normal     Neuro/Psych Anxiety Depression negative neurological ROS     GI/Hepatic Neg liver ROS,   Endo/Other  Morbid obesity  Renal/GU negative Renal ROS  negative genitourinary   Musculoskeletal negative musculoskeletal ROS (+)   Abdominal   Peds negative pediatric ROS (+)  Hematology negative hematology ROS (+)   Anesthesia Other Findings   Reproductive/Obstetrics negative OB ROS                            Anesthesia Physical Anesthesia Plan  ASA: III  Anesthesia Plan: General   Post-op Pain Management:    Induction: Intravenous  Airway Management Planned: Nasal ETT  Additional Equipment:   Intra-op Plan:   Post-operative Plan: Extubation in OR  Informed Consent: I have reviewed the patients History and Physical, chart, labs and discussed the procedure including the risks, benefits and alternatives for the proposed anesthesia with the patient or authorized representative who has indicated his/her understanding and acceptance.   Dental advisory given  Plan Discussed with: CRNA  Anesthesia Plan Comments:         Anesthesia Quick Evaluation

## 2015-06-14 NOTE — Progress Notes (Signed)
PRE-OPERATIVE NOTE:  06/14/2015   Dareen Piano [id number]  VITALS: BP 141/83 mmHg  Pulse 75  Temp(Src) 98 F (36.7 C) (Oral)  Resp 18  Ht 5\' 2"  (1.575 m)  Wt 224 lb 8 oz (101.833 kg)  BMI 41.05 kg/m2  SpO2 100%  Lab Results  Component Value Date   WBC 6.3 06/13/2015   HGB 11.7* 06/13/2015   HCT 37.9 06/13/2015   MCV 85.7 06/13/2015   PLT 246 06/13/2015   BMET    Component Value Date/Time   NA 137 06/13/2015 0940   NA 138 05/19/2015 0921   K 3.9 06/13/2015 0940   K 3.9 05/19/2015 0921   CL 103 06/13/2015 0940   CO2 25 06/13/2015 0940   CO2 25 05/19/2015 0921   GLUCOSE 88 06/13/2015 0940   GLUCOSE 91 05/19/2015 0921   BUN 7 06/13/2015 0940   BUN 6.6* 05/19/2015 0921   CREATININE 0.89 06/13/2015 0940   CREATININE 1.1 05/19/2015 0921   CALCIUM 9.3 06/13/2015 0940   CALCIUM 9.1 05/19/2015 0921   GFRNONAA >60 06/13/2015 0940   GFRAA >60 06/13/2015 0940    No results found for: INR, PROTIME No results found for: PTT   Roberts Gaudy Neider presents for extraction of remaining teeth with alveoloplasty in the operating room with general anesthesia.   SUBJECTIVE: The patient denies any acute medical or dental changes and agrees to proceed with treatment as planned. Patient expresses understanding of the high-risk for complications with the anticipated dental procedures.  EXAM: No sign of acute dental changes.  ASSESSMENT: Patient is affected by chronic apical periodontitis, multiple retained root segments, dental caries, chronic periodontitis.  PLAN: Patient agrees to proceed with treatment as planned in the operating room as previously discussed and accepts the risks, benefits, and complications of the proposed treatment. Patient is aware of the risk for bleeding, bruising, swelling, infection, pain, nerve damage, soft tissue damage, sinus involvement, root tip fracture, mandible fracture, and the risks of complications associated with the anesthesia. Patient also  is aware of the potential for other complications up to and including death as well as potential for prolonged intubation.   Lenn Cal, DDS

## 2015-06-14 NOTE — Transfer of Care (Signed)
Immediate Anesthesia Transfer of Care Note  Patient: Debra [name]  Procedure(s) Performed: Procedure(s): Extraction of tooth #'s 2, 4-14, 20-29 with alveoloplasty (N/A)  Patient Location: PACU  Anesthesia Type:General  Level of Consciousness:  sedated, patient cooperative and responds to stimulation  Airway & Oxygen Therapy:Patient Spontanous Breathing and Patient connected to face mask oxgen  Post-op Assessment:  Report given to PACU RN and Post -op Vital signs reviewed and stable  Post vital signs:  Reviewed and stable  Last Vitals:  Filed Vitals:   06/14/15 0519  BP: 141/83  Pulse: 75  Temp: 36.7 C  Resp: 18    Complications: No apparent anesthesia complications

## 2015-06-14 NOTE — Anesthesia Procedure Notes (Signed)
Procedure Name: Intubation Date/Time: 06/14/2015 7:45 AM Performed by: Anne Fu Pre-anesthesia Checklist: Patient identified, Emergency Drugs available, Suction available and Patient being monitored Patient Re-evaluated:Patient Re-evaluated prior to inductionOxygen Delivery Method: Circle system utilized Preoxygenation: Pre-oxygenation with 100% oxygen Intubation Type: IV induction Ventilation: Mask ventilation without difficulty Laryngoscope Size: Mac and 4 Nasal Tubes: Nasal Rae Tube size: 7.0 mm Number of attempts: 1 Placement Confirmation: ETT inserted through vocal cords under direct vision and positive ETCO2 Tube secured with: Tape Dental Injury: Teeth and Oropharynx as per pre-operative assessment

## 2015-06-14 NOTE — H&P (Signed)
06/14/2015  Patient:            Debra [name] Date of Birth:  12/20/1970 MRN:                [id number]   BP 141/83 mmHg  Pulse 75  Temp(Src) 98 F (36.7 C) (Oral)  Resp 18  Ht 5\' 2"  (1.575 m)  Wt 224 lb 8 oz (101.833 kg)  BMI 41.05 kg/m2  SpO2 100%   [name] resents for extraction remaining teeth with alveoloplasty in the operating with dental anesthesia. Patient denies any acute medical or dental changes. Patient did receive surgical clearance by Dr.Javaid and patient is at high-risk for complications. Please use Dr.Javiad's note in the paper chart to act as H&P for dental operating room procedure.  Lenn Cal, DDS

## 2015-06-14 NOTE — Op Note (Signed)
OPERATIVE REPORT  Patient:            Debra [name] Date of Birth:  [birth year] MRN:                [id number]   DATE OF PROCEDURE:  06/14/2015  PREOPERATIVE DIAGNOSES: 1. Ovarian cancer 2. Prechemotherapy dental protocol 3. Chronic apical periodontitis 4. Rampant dental caries 5. Multiple retained root segments 6. Chronic periodontitis  POSTOPERATIVE DIAGNOSES: 1. Ovarian cancer 2. Prechemotherapy dental protocol 3. Chronic apical periodontitis 4. Rampant dental caries 5. Multiple retained root segments 6. Chronic periodontitis  OPERATIONS: 1. Multiple extraction of tooth numbers 2, 4-14, 20-29. 2. 4 Quadrants of alveoloplasty   SURGEON: Lenn Cal, DDS  ASSISTANT: Camie Patience, (dental assistant)  ANESTHESIA: General anesthesia via nasoendotracheal tube.  MEDICATIONS: 1. Ancef 2 g IV prior to invasive dental procedures. 2. Local anesthesia with a total utilization of 5 carpules each containing 34 mg of lidocaine with 0.017 mg of epinephrine as well as 2 carpules each containing 9 mg of bupivacaine with 0.009 mg of epinephrine.  SPECIMENS: There are 22 teeth that were discarded.  DRAINS: None  CULTURES: None  COMPLICATIONS: None  ESTIMATED BLOOD LOSS: 50 mLs.  INTRAVENOUS FLUIDS: 1000 mLs of Lactated ringers solution.  INDICATIONS: The patient was recently diagnosed with ovarian cancer.  A medically necessary dental consultation was then requested to evaluate poor dentition. The patient was examined and treatment planned for extraction of remaining teeth with alveoloplasty in the operating room with general anesthesia.  This treatment plan was formulated to decrease the risks and complications associated with dental infection from affecting the patient's systemic health and the anticipated chemotherapy.  OPERATIVE FINDINGS: Patient was examined operating room number 4.  The teeth were identified for extraction. The patient was noted be affected by  chronic apical periodontitis, rampant dental caries, multiple retained root segments, and chronic periodontitis.   DESCRIPTION OF PROCEDURE: Patient was brought to the main operating room number 4. Patient was then placed in the supine position on the operating room. General anesthesia was then induced per the anesthesia team. The patient was then prepped and draped in the usual manner for dental medicine procedure. A timeout was performed. The patient was identified and procedures were verified. A throat pack was placed at this time. The oral cavity was then thoroughly examined with the findings noted above. The patient was then ready for dental medicine procedure as follows:  Local anesthesia was then administered sequentially with a total utilization of 5 carpules each containing 34 mg of lidocaine with 0.017 mg of epinephrine as well as 2 carpules  each containing 9 mg bupivacaine with 0.009 mg of epinephrine.  The Maxillary left and right quadrants first approached. Anesthesia was then delivered utilizing infiltration with lidocaine with epinephrine. A #15 blade incision was then made from the maxillary right tuberosity and extended to the maxillary left tuberosity.  A  surgical flap was then carefully reflected. The maxillary teeth were then subluxated with a series of straight elevators. Tooth numbers 2, 4,5,6,7,8,9,10,11,12,13 were then removed with a 150 forceps without complications. Tooth #14 was then removed with a 53L forceps without complications. Alveoloplasty was then performed utilizing a ronguers and bone file. The surgical site was then irrigated with copious amounts of sterile saline. The tissues were approximated and trimmed appropriately. A piece of Surgifoam was then placed in the extraction sockets from tooth numbers 2 through 4 and 14 through 16. The maxillary right surgical site was then  closed from the maxillary right tuberosity and extended to the mesial #8 utilizing 3-0 chromic  gut suture in a continuous interrupted suture technique 1. The maxillary left surgical site was then closed from the maxillary left tuberosity and extended the mesial #9 utilizing 3-0 chromic gut suture in a continuous interrupted suture technique 1.  At this point time, the mandibular quadrants were approached. The patient was given bilateral inferior alveolar nerve blocks and long buccal nerve blocks utilizing the bupivacaine with epinephrine. Further infiltration was then achieved utilizing the lidocaine with epinephrine. A 15 blade incision was then made from the distal of number 18 and extended to the distal of #31.  A surgical flap was then carefully reflected. The lower teeth were then subluxated with a series of straight elevators. Tooth numbers 20, 21, 22, 23, 24, 25, 26, 27, 28, and 29 were then removed utilizing a 151 forceps. Alveoloplasty was then performed utilizing a rongeurs and bone file. The tissues were approximated and trimmed appropriately. The surgical sites were then irrigated with copious amounts of sterile saline. The mandibular left surgical site was then closed from the distal of #18 and extended the mesial #24 utilizing 3-0 chromic gut suture in a continuous interrupted suture technique 1. The mandibular right surgical site was then closed from the distal of #31 and extended to the mesial #25 utilizing 3-0 chromic gut suture in a continuous interrupted suture technique 1  At this point time, the entire mouth was irrigated with copious amounts of sterile saline. The patient was examined for complications, seeing none, the dental medicine procedure was deemed to be complete. The throat pack was removed at this time. An oral airway was then placed at the request of the anesthesia team. A series of 4 x 4 gauze were placed in the mouth to aid hemostasis. The patient was then handed over to the anesthesia team for final disposition. After an appropriate amount of time, the patient was  extubated and taken to the postanesthsia care unit in good condition. All counts were correct for the dental medicine procedure. Patient was given a prescription for Percocet 5/325. Patient is to take one to 2 tablets every 4-6 hours as needed for pain. Patient indicated prior to surgery that she was out of her morphine pain medication and will obtain a prescription on Friday from her PCP. Patient was instructed not to take her Percocet with her morphine pain medication.  Lenn Cal, DDS.

## 2015-06-14 NOTE — Anesthesia Postprocedure Evaluation (Signed)
  Anesthesia Post-op Note  Patient: Debra [name]  Procedure(s) Performed: Procedure(s) (LRB): Extraction of tooth #'s 2, 4-14, 20-29 with alveoloplasty (N/A)  Patient Location: PACU  Anesthesia Type: General  Level of Consciousness: awake and alert   Airway and Oxygen Therapy: Patient Spontanous Breathing  Post-op Pain: mild  Post-op Assessment: Post-op Vital signs reviewed, Patient's Cardiovascular Status Stable, Respiratory Function Stable, Patent Airway and No signs of Nausea or vomiting  Last Vitals:  Filed Vitals:   06/14/15 1125  BP: 123/50  Pulse: 97  Temp:   Resp: 16    Post-op Vital Signs: stable   Complications: No apparent anesthesia complications

## 2015-06-16 ENCOUNTER — Ambulatory Visit: Payer: Medicaid Other

## 2015-06-19 ENCOUNTER — Other Ambulatory Visit: Payer: Self-pay | Admitting: Oncology

## 2015-06-19 DIAGNOSIS — C562 Malignant neoplasm of left ovary: Principal | ICD-10-CM

## 2015-06-19 DIAGNOSIS — C563 Malignant neoplasm of bilateral ovaries: Secondary | ICD-10-CM

## 2015-06-19 DIAGNOSIS — C561 Malignant neoplasm of right ovary: Secondary | ICD-10-CM

## 2015-06-21 ENCOUNTER — Other Ambulatory Visit: Payer: Self-pay | Admitting: Oncology

## 2015-06-23 ENCOUNTER — Emergency Department (HOSPITAL_COMMUNITY): Payer: Medicaid Other

## 2015-06-23 ENCOUNTER — Ambulatory Visit: Payer: Medicaid Other | Admitting: Oncology

## 2015-06-23 ENCOUNTER — Inpatient Hospital Stay (HOSPITAL_COMMUNITY): Payer: Medicaid Other

## 2015-06-23 ENCOUNTER — Encounter (HOSPITAL_COMMUNITY): Payer: Self-pay

## 2015-06-23 ENCOUNTER — Other Ambulatory Visit: Payer: Medicaid Other

## 2015-06-23 ENCOUNTER — Inpatient Hospital Stay (HOSPITAL_COMMUNITY)
Admission: EM | Admit: 2015-06-23 | Discharge: 2015-06-30 | DRG: 054 | Disposition: A | Discharge status: Home Health | Payer: Medicaid Other | Attending: Family Medicine | Admitting: Family Medicine

## 2015-06-23 ENCOUNTER — Ambulatory Visit (HOSPITAL_COMMUNITY): Payer: Self-pay | Admitting: Dentistry

## 2015-06-23 ENCOUNTER — Telehealth (HOSPITAL_COMMUNITY): Payer: Self-pay

## 2015-06-23 DIAGNOSIS — M25569 Pain in unspecified knee: Secondary | ICD-10-CM | POA: Diagnosis present

## 2015-06-23 DIAGNOSIS — E039 Hypothyroidism, unspecified: Secondary | ICD-10-CM | POA: Diagnosis present

## 2015-06-23 DIAGNOSIS — R32 Unspecified urinary incontinence: Secondary | ICD-10-CM | POA: Diagnosis present

## 2015-06-23 DIAGNOSIS — Z8701 Personal history of pneumonia (recurrent): Secondary | ICD-10-CM | POA: Diagnosis not present

## 2015-06-23 DIAGNOSIS — Z87891 Personal history of nicotine dependence: Secondary | ICD-10-CM | POA: Diagnosis not present

## 2015-06-23 DIAGNOSIS — D72829 Elevated white blood cell count, unspecified: Secondary | ICD-10-CM | POA: Diagnosis present

## 2015-06-23 DIAGNOSIS — C563 Malignant neoplasm of bilateral ovaries: Secondary | ICD-10-CM

## 2015-06-23 DIAGNOSIS — Z888 Allergy status to other drugs, medicaments and biological substances status: Secondary | ICD-10-CM

## 2015-06-23 DIAGNOSIS — K5190 Ulcerative colitis, unspecified, without complications: Secondary | ICD-10-CM | POA: Diagnosis present

## 2015-06-23 DIAGNOSIS — M549 Dorsalgia, unspecified: Secondary | ICD-10-CM | POA: Diagnosis present

## 2015-06-23 DIAGNOSIS — E079 Disorder of thyroid, unspecified: Secondary | ICD-10-CM | POA: Diagnosis not present

## 2015-06-23 DIAGNOSIS — W19XXXA Unspecified fall, initial encounter: Secondary | ICD-10-CM | POA: Diagnosis not present

## 2015-06-23 DIAGNOSIS — T380X5A Adverse effect of glucocorticoids and synthetic analogues, initial encounter: Secondary | ICD-10-CM | POA: Diagnosis present

## 2015-06-23 DIAGNOSIS — Z515 Encounter for palliative care: Secondary | ICD-10-CM | POA: Diagnosis not present

## 2015-06-23 DIAGNOSIS — Z880 Allergy status to penicillin: Secondary | ICD-10-CM | POA: Diagnosis not present

## 2015-06-23 DIAGNOSIS — K439 Ventral hernia without obstruction or gangrene: Secondary | ICD-10-CM | POA: Diagnosis present

## 2015-06-23 DIAGNOSIS — Z90710 Acquired absence of both cervix and uterus: Secondary | ICD-10-CM

## 2015-06-23 DIAGNOSIS — D649 Anemia, unspecified: Secondary | ICD-10-CM | POA: Diagnosis present

## 2015-06-23 DIAGNOSIS — Z8249 Family history of ischemic heart disease and other diseases of the circulatory system: Secondary | ICD-10-CM

## 2015-06-23 DIAGNOSIS — M1990 Unspecified osteoarthritis, unspecified site: Secondary | ICD-10-CM | POA: Diagnosis present

## 2015-06-23 DIAGNOSIS — C562 Malignant neoplasm of left ovary: Secondary | ICD-10-CM | POA: Diagnosis not present

## 2015-06-23 DIAGNOSIS — C775 Secondary and unspecified malignant neoplasm of intrapelvic lymph nodes: Secondary | ICD-10-CM | POA: Diagnosis not present

## 2015-06-23 DIAGNOSIS — Z8041 Family history of malignant neoplasm of ovary: Secondary | ICD-10-CM | POA: Diagnosis not present

## 2015-06-23 DIAGNOSIS — C561 Malignant neoplasm of right ovary: Secondary | ICD-10-CM | POA: Diagnosis not present

## 2015-06-23 DIAGNOSIS — F419 Anxiety disorder, unspecified: Secondary | ICD-10-CM | POA: Diagnosis present

## 2015-06-23 DIAGNOSIS — J449 Chronic obstructive pulmonary disease, unspecified: Secondary | ICD-10-CM | POA: Diagnosis present

## 2015-06-23 DIAGNOSIS — G40909 Epilepsy, unspecified, not intractable, without status epilepticus: Secondary | ICD-10-CM | POA: Diagnosis present

## 2015-06-23 DIAGNOSIS — R197 Diarrhea, unspecified: Secondary | ICD-10-CM

## 2015-06-23 DIAGNOSIS — Z9981 Dependence on supplemental oxygen: Secondary | ICD-10-CM | POA: Diagnosis not present

## 2015-06-23 DIAGNOSIS — Z79899 Other long term (current) drug therapy: Secondary | ICD-10-CM | POA: Diagnosis not present

## 2015-06-23 DIAGNOSIS — Z6841 Body Mass Index (BMI) 40.0 and over, adult: Secondary | ICD-10-CM | POA: Diagnosis not present

## 2015-06-23 DIAGNOSIS — G8921 Chronic pain due to trauma: Secondary | ICD-10-CM | POA: Diagnosis present

## 2015-06-23 DIAGNOSIS — C7931 Secondary malignant neoplasm of brain: Principal | ICD-10-CM

## 2015-06-23 DIAGNOSIS — Z803 Family history of malignant neoplasm of breast: Secondary | ICD-10-CM

## 2015-06-23 DIAGNOSIS — Z87410 Personal history of cervical dysplasia: Secondary | ICD-10-CM

## 2015-06-23 DIAGNOSIS — D899 Disorder involving the immune mechanism, unspecified: Secondary | ICD-10-CM | POA: Diagnosis present

## 2015-06-23 DIAGNOSIS — Z825 Family history of asthma and other chronic lower respiratory diseases: Secondary | ICD-10-CM | POA: Diagnosis not present

## 2015-06-23 DIAGNOSIS — R609 Edema, unspecified: Secondary | ICD-10-CM | POA: Insufficient documentation

## 2015-06-23 DIAGNOSIS — R296 Repeated falls: Secondary | ICD-10-CM | POA: Diagnosis present

## 2015-06-23 DIAGNOSIS — R410 Disorientation, unspecified: Secondary | ICD-10-CM | POA: Diagnosis present

## 2015-06-23 DIAGNOSIS — R2242 Localized swelling, mass and lump, left lower limb: Secondary | ICD-10-CM | POA: Diagnosis not present

## 2015-06-23 DIAGNOSIS — Z882 Allergy status to sulfonamides status: Secondary | ICD-10-CM

## 2015-06-23 DIAGNOSIS — G9340 Encephalopathy, unspecified: Secondary | ICD-10-CM

## 2015-06-23 DIAGNOSIS — Z820 Family history of epilepsy and other diseases of the nervous system: Secondary | ICD-10-CM | POA: Diagnosis not present

## 2015-06-23 DIAGNOSIS — K210 Gastro-esophageal reflux disease with esophagitis: Secondary | ICD-10-CM | POA: Diagnosis present

## 2015-06-23 DIAGNOSIS — F329 Major depressive disorder, single episode, unspecified: Secondary | ICD-10-CM | POA: Diagnosis not present

## 2015-06-23 DIAGNOSIS — R9089 Other abnormal findings on diagnostic imaging of central nervous system: Secondary | ICD-10-CM

## 2015-06-23 DIAGNOSIS — I890 Lymphedema, not elsewhere classified: Secondary | ICD-10-CM | POA: Diagnosis present

## 2015-06-23 DIAGNOSIS — G40109 Localization-related (focal) (partial) symptomatic epilepsy and epileptic syndromes with simple partial seizures, not intractable, without status epilepticus: Secondary | ICD-10-CM | POA: Diagnosis not present

## 2015-06-23 DIAGNOSIS — R600 Localized edema: Secondary | ICD-10-CM

## 2015-06-23 DIAGNOSIS — E440 Moderate protein-calorie malnutrition: Secondary | ICD-10-CM | POA: Diagnosis present

## 2015-06-23 DIAGNOSIS — J9610 Chronic respiratory failure, unspecified whether with hypoxia or hypercapnia: Secondary | ICD-10-CM | POA: Diagnosis present

## 2015-06-23 DIAGNOSIS — B370 Candidal stomatitis: Secondary | ICD-10-CM

## 2015-06-23 DIAGNOSIS — Y92231 Patient bathroom in hospital as the place of occurrence of the external cause: Secondary | ICD-10-CM | POA: Diagnosis not present

## 2015-06-23 DIAGNOSIS — R270 Ataxia, unspecified: Secondary | ICD-10-CM | POA: Diagnosis not present

## 2015-06-23 DIAGNOSIS — Z79891 Long term (current) use of opiate analgesic: Secondary | ICD-10-CM | POA: Diagnosis not present

## 2015-06-23 DIAGNOSIS — Z818 Family history of other mental and behavioral disorders: Secondary | ICD-10-CM

## 2015-06-23 DIAGNOSIS — F32A Depression, unspecified: Secondary | ICD-10-CM | POA: Diagnosis present

## 2015-06-23 DIAGNOSIS — K219 Gastro-esophageal reflux disease without esophagitis: Secondary | ICD-10-CM | POA: Diagnosis present

## 2015-06-23 DIAGNOSIS — J4540 Moderate persistent asthma, uncomplicated: Secondary | ICD-10-CM

## 2015-06-23 HISTORY — DX: Chronic respiratory failure, unspecified whether with hypoxia or hypercapnia: J96.10

## 2015-06-23 LAB — BLOOD GAS, ARTERIAL
Acid-Base Excess: 1.5 mmol/L (ref 0.0–2.0)
BICARBONATE: 26.6 meq/L — AB (ref 20.0–24.0)
DRAWN BY: 257701
O2 CONTENT: 2 L/min
O2 Saturation: 96.6 %
PCO2 ART: 47.6 mmHg — AB (ref 35.0–45.0)
PH ART: 7.366 (ref 7.350–7.450)
PO2 ART: 92.9 mmHg (ref 80.0–100.0)
Patient temperature: 98.6
TCO2: 25 mmol/L (ref 0–100)

## 2015-06-23 LAB — CBC WITH DIFFERENTIAL/PLATELET
BASOS ABS: 0 10*3/uL (ref 0.0–0.1)
BASOS PCT: 0 %
EOS ABS: 0.3 10*3/uL (ref 0.0–0.7)
Eosinophils Relative: 4 %
HEMATOCRIT: 31.2 % — AB (ref 36.0–46.0)
HEMOGLOBIN: 9.6 g/dL — AB (ref 12.0–15.0)
Lymphocytes Relative: 24 %
Lymphs Abs: 1.8 10*3/uL (ref 0.7–4.0)
MCH: 26.8 pg (ref 26.0–34.0)
MCHC: 30.8 g/dL (ref 30.0–36.0)
MCV: 87.2 fL (ref 78.0–100.0)
MONOS PCT: 13 %
Monocytes Absolute: 1 10*3/uL (ref 0.1–1.0)
NEUTROS PCT: 59 %
Neutro Abs: 4.5 10*3/uL (ref 1.7–7.7)
Platelets: 277 10*3/uL (ref 150–400)
RBC: 3.58 MIL/uL — AB (ref 3.87–5.11)
RDW: 16.4 % — ABNORMAL HIGH (ref 11.5–15.5)
WBC: 7.6 10*3/uL (ref 4.0–10.5)

## 2015-06-23 LAB — COMPREHENSIVE METABOLIC PANEL
ALBUMIN: 3.4 g/dL — AB (ref 3.5–5.0)
ALK PHOS: 54 U/L (ref 38–126)
ALT: 12 U/L — AB (ref 14–54)
ANION GAP: 9 (ref 5–15)
AST: 15 U/L (ref 15–41)
BILIRUBIN TOTAL: 0.3 mg/dL (ref 0.3–1.2)
BUN: 11 mg/dL (ref 6–20)
CALCIUM: 8.7 mg/dL — AB (ref 8.9–10.3)
CO2: 27 mmol/L (ref 22–32)
CREATININE: 0.88 mg/dL (ref 0.44–1.00)
Chloride: 98 mmol/L — ABNORMAL LOW (ref 101–111)
GFR calc Af Amer: >60 mL/min (ref >60)
GFR calc non Af Amer: >60 mL/min (ref >60)
GLUCOSE: 82 mg/dL (ref 65–99)
Potassium: 3.8 mmol/L (ref 3.5–5.1)
SODIUM: 134 mmol/L — AB (ref 135–145)
TOTAL PROTEIN: 6.7 g/dL (ref 6.5–8.1)

## 2015-06-23 LAB — URINALYSIS, ROUTINE W REFLEX MICROSCOPIC
Bilirubin Urine: NEGATIVE
GLUCOSE, UA: NEGATIVE mg/dL
Hgb urine dipstick: NEGATIVE
KETONES UR: NEGATIVE mg/dL
LEUKOCYTES UA: NEGATIVE
Nitrite: NEGATIVE
PROTEIN: NEGATIVE mg/dL
Specific Gravity, Urine: 1.006 (ref 1.005–1.030)
UROBILINOGEN UA: 0.2 mg/dL (ref 0.0–1.0)
pH: 5 (ref 5.0–8.0)

## 2015-06-23 LAB — TROPONIN I: Troponin I: <0.03 ng/mL (ref <0.031)

## 2015-06-23 LAB — FERRITIN: FERRITIN: 42 ng/mL (ref 11–307)

## 2015-06-23 LAB — IRON AND TIBC
Iron: 27 ug/dL — ABNORMAL LOW (ref 28–170)
SATURATION RATIOS: 6 % — AB (ref 10.4–31.8)
TIBC: 438 ug/dL (ref 250–450)
UIBC: 411 ug/dL

## 2015-06-23 LAB — MAGNESIUM: Magnesium: 1.6 mg/dL — ABNORMAL LOW (ref 1.7–2.4)

## 2015-06-23 LAB — RAPID URINE DRUG SCREEN, HOSP PERFORMED
Amphetamines: NOT DETECTED
BARBITURATES: NOT DETECTED
BENZODIAZEPINES: POSITIVE — AB
Cocaine: NOT DETECTED
Opiates: POSITIVE — AB
Tetrahydrocannabinol: NOT DETECTED

## 2015-06-23 LAB — VITAMIN B12: Vitamin B-12: 702 pg/mL (ref 180–914)

## 2015-06-23 LAB — RETICULOCYTES
RBC.: 3.55 MIL/uL — AB (ref 3.87–5.11)
RETIC CT PCT: 1.9 % (ref 0.4–3.1)
Retic Count, Absolute: 67.5 10*3/uL (ref 19.0–186.0)

## 2015-06-23 MED ORDER — PROCHLORPERAZINE EDISYLATE 5 MG/ML IJ SOLN
10.0000 mg | Freq: Four times a day (QID) | INTRAMUSCULAR | Status: DC | PRN
  Administered 2015-06-27 – 2015-06-29 (×2): 10 mg via INTRAVENOUS
  Filled 2015-06-23 (×2): qty 2

## 2015-06-23 MED ORDER — CETYLPYRIDINIUM CHLORIDE 0.05 % MT LIQD
7.0000 mL | Freq: Two times a day (BID) | OROMUCOSAL | Status: DC
  Administered 2015-06-23 – 2015-06-29 (×12): 7 mL via OROMUCOSAL

## 2015-06-23 MED ORDER — IPRATROPIUM-ALBUTEROL 0.5-2.5 (3) MG/3ML IN SOLN
3.0000 mL | Freq: Four times a day (QID) | RESPIRATORY_TRACT | Status: DC
  Administered 2015-06-23: 3 mL via RESPIRATORY_TRACT
  Filled 2015-06-23: qty 3

## 2015-06-23 MED ORDER — ENOXAPARIN SODIUM 40 MG/0.4ML ~~LOC~~ SOLN: 40.0000 mg | SUBCUTANEOUS | Status: DC

## 2015-06-23 MED ORDER — GI COCKTAIL ~~LOC~~: 30.0000 mL | Freq: Three times a day (TID) | ORAL | Status: DC | PRN

## 2015-06-23 MED ORDER — DEXAMETHASONE SODIUM PHOSPHATE 10 MG/ML IJ SOLN: 10.0000 mg | Freq: Four times a day (QID) | INTRAMUSCULAR | Status: DC

## 2015-06-23 MED ORDER — CHLORHEXIDINE GLUCONATE 0.12 % MT SOLN
15.0000 mL | Freq: Two times a day (BID) | OROMUCOSAL | Status: DC
  Administered 2015-06-23 – 2015-06-30 (×14): 15 mL via OROMUCOSAL
  Filled 2015-06-23 (×14): qty 15

## 2015-06-23 MED ORDER — MOMETASONE FURO-FORMOTEROL FUM 200-5 MCG/ACT IN AERO
2.0000 | INHALATION_SPRAY | Freq: Two times a day (BID) | RESPIRATORY_TRACT | Status: DC
  Administered 2015-06-23 – 2015-06-30 (×14): 2 via RESPIRATORY_TRACT
  Filled 2015-06-23 (×2): qty 8.8

## 2015-06-23 MED ORDER — SORBITOL 70 % SOLN
30.0000 mL | Freq: Every day | Status: DC | PRN
  Filled 2015-06-23: qty 30

## 2015-06-23 MED ORDER — IPRATROPIUM-ALBUTEROL 0.5-2.5 (3) MG/3ML IN SOLN: 3.0000 mL | Freq: Four times a day (QID) | RESPIRATORY_TRACT | Status: DC

## 2015-06-23 MED ORDER — IPRATROPIUM-ALBUTEROL 0.5-2.5 (3) MG/3ML IN SOLN
3.0000 mL | RESPIRATORY_TRACT | Status: DC | PRN
  Administered 2015-06-25: 3 mL via RESPIRATORY_TRACT
  Filled 2015-06-23: qty 3

## 2015-06-23 MED ORDER — MORPHINE SULFATE ER 30 MG PO TBCR
30.0000 mg | EXTENDED_RELEASE_TABLET | Freq: Two times a day (BID) | ORAL | Status: DC
  Administered 2015-06-23 – 2015-06-30 (×14): 30 mg via ORAL
  Filled 2015-06-23 (×14): qty 1

## 2015-06-23 MED ORDER — DEXAMETHASONE SODIUM PHOSPHATE 10 MG/ML IJ SOLN
10.0000 mg | Freq: Once | INTRAMUSCULAR | Status: AC
  Administered 2015-06-23: 10 mg via INTRAVENOUS
  Filled 2015-06-23: qty 1

## 2015-06-23 MED ORDER — ALPRAZOLAM 0.5 MG PO TABS
0.5000 mg | ORAL_TABLET | Freq: Two times a day (BID) | ORAL | Status: DC
  Administered 2015-06-23 – 2015-06-24 (×2): 0.5 mg via ORAL
  Filled 2015-06-23 (×2): qty 1

## 2015-06-23 MED ORDER — TRAZODONE HCL 50 MG PO TABS
100.0000 mg | ORAL_TABLET | Freq: Every evening | ORAL | Status: DC | PRN
  Administered 2015-06-23 – 2015-06-25 (×2): 100 mg via ORAL
  Filled 2015-06-23 (×2): qty 2

## 2015-06-23 MED ORDER — CITALOPRAM HYDROBROMIDE 20 MG PO TABS
40.0000 mg | ORAL_TABLET | Freq: Every morning | ORAL | Status: DC
  Administered 2015-06-24 – 2015-06-29 (×6): 40 mg via ORAL
  Filled 2015-06-23 (×7): qty 2

## 2015-06-23 MED ORDER — SODIUM CHLORIDE 0.9 % IV SOLN
INTRAVENOUS | Status: DC
  Administered 2015-06-23 – 2015-06-25 (×4): via INTRAVENOUS

## 2015-06-23 MED ORDER — ACETAMINOPHEN 325 MG PO TABS: 650.0000 mg | ORAL_TABLET | Freq: Four times a day (QID) | ORAL | Status: DC | PRN

## 2015-06-23 MED ORDER — PROMETHAZINE HCL 25 MG PO TABS: 25.0000 mg | ORAL_TABLET | Freq: Four times a day (QID) | ORAL | Status: DC | PRN

## 2015-06-23 MED ORDER — FUROSEMIDE 40 MG PO TABS
40.0000 mg | ORAL_TABLET | Freq: Every day | ORAL | Status: DC
  Administered 2015-06-24 – 2015-06-30 (×7): 40 mg via ORAL
  Filled 2015-06-23 (×7): qty 1

## 2015-06-23 MED ORDER — TIOTROPIUM BROMIDE MONOHYDRATE 2.5 MCG/ACT IN AERS: 2.0000 | INHALATION_SPRAY | Freq: Two times a day (BID) | RESPIRATORY_TRACT | Status: DC

## 2015-06-23 MED ORDER — BENZONATATE 100 MG PO CAPS
200.0000 mg | ORAL_CAPSULE | Freq: Three times a day (TID) | ORAL | Status: DC | PRN
  Administered 2015-06-23 – 2015-06-26 (×2): 200 mg via ORAL
  Filled 2015-06-23 (×2): qty 2

## 2015-06-23 MED ORDER — PROMETHAZINE HCL 25 MG PO TABS
25.0000 mg | ORAL_TABLET | Freq: Four times a day (QID) | ORAL | Status: DC | PRN
  Administered 2015-06-24 – 2015-06-29 (×5): 25 mg via ORAL
  Filled 2015-06-23 (×5): qty 1

## 2015-06-23 MED ORDER — DEXAMETHASONE SODIUM PHOSPHATE 10 MG/ML IJ SOLN
10.0000 mg | Freq: Four times a day (QID) | INTRAMUSCULAR | Status: DC
  Administered 2015-06-24 – 2015-06-27 (×13): 10 mg via INTRAVENOUS
  Filled 2015-06-23 (×13): qty 1

## 2015-06-23 MED ORDER — LEVOTHYROXINE SODIUM 50 MCG PO TABS
175.0000 ug | ORAL_TABLET | Freq: Every day | ORAL | Status: DC
  Administered 2015-06-24 – 2015-06-30 (×7): 175 ug via ORAL
  Filled 2015-06-23 (×21): qty 1

## 2015-06-23 MED ORDER — ACETAMINOPHEN 650 MG RE SUPP: 650.0000 mg | Freq: Four times a day (QID) | RECTAL | Status: DC | PRN

## 2015-06-23 MED ORDER — DM-GUAIFENESIN ER 30-600 MG PO TB12: 2.0000 | ORAL_TABLET | Freq: Two times a day (BID) | ORAL | Status: DC | PRN

## 2015-06-23 MED ORDER — MONTELUKAST SODIUM 10 MG PO TABS
10.0000 mg | ORAL_TABLET | Freq: Every day | ORAL | Status: DC
  Administered 2015-06-25 – 2015-06-29 (×6): 10 mg via ORAL
  Filled 2015-06-23 (×6): qty 1

## 2015-06-23 MED ORDER — SENNOSIDES-DOCUSATE SODIUM 8.6-50 MG PO TABS
1.0000 | ORAL_TABLET | Freq: Every evening | ORAL | Status: DC | PRN
  Administered 2015-06-25: 1 via ORAL
  Filled 2015-06-23: qty 1

## 2015-06-23 MED ORDER — MUCINEX DM 30-600 MG PO TB12: 2.0000 | ORAL_TABLET | Freq: Four times a day (QID) | ORAL | Status: DC | PRN

## 2015-06-23 MED ORDER — OXYCODONE-ACETAMINOPHEN 5-325 MG PO TABS
1.0000 | ORAL_TABLET | ORAL | Status: DC | PRN
  Administered 2015-06-23 – 2015-06-30 (×19): 2 via ORAL
  Filled 2015-06-23 (×19): qty 2

## 2015-06-23 MED ORDER — BUPROPION HCL ER (SR) 150 MG PO TB12
150.0000 mg | ORAL_TABLET | Freq: Two times a day (BID) | ORAL | Status: DC
  Administered 2015-06-23 – 2015-06-29 (×13): 150 mg via ORAL
  Filled 2015-06-23 (×17): qty 1

## 2015-06-23 MED ORDER — ENOXAPARIN SODIUM 60 MG/0.6ML ~~LOC~~ SOLN
50.0000 mg | SUBCUTANEOUS | Status: DC
  Administered 2015-06-23 – 2015-06-25 (×3): 50 mg via SUBCUTANEOUS
  Filled 2015-06-23 (×3): qty 0.6

## 2015-06-23 MED ORDER — SODIUM CHLORIDE 0.9 % IJ SOLN: 10.0000 mL | INTRAMUSCULAR | Status: DC | PRN

## 2015-06-23 MED ORDER — PANTOPRAZOLE SODIUM 40 MG PO TBEC
40.0000 mg | DELAYED_RELEASE_TABLET | Freq: Two times a day (BID) | ORAL | Status: DC
  Administered 2015-06-23 – 2015-06-30 (×14): 40 mg via ORAL
  Filled 2015-06-23 (×14): qty 1

## 2015-06-23 NOTE — ED Notes (Signed)
Bed: WA02 Expected date:  Expected time:  Means of arrival:  Comments: Ems-sob

## 2015-06-23 NOTE — Progress Notes (Signed)
MEDICAL ONCOLOGY CONSULTATION NOTE June 23, 2015, 7:57 PM  Hospital day 1 Antibiotics:  none Chemotherapy:  None  Outpatient physicians: Everitt Amber, [name]), Guadlupe Spanish, MD(PCP); Marcy Panning (pulmonary, [phone]); Virgel Bouquet (GI, [facility])  Appreciate notification by ED of evaluation today and admission to hospitalist service of this unfortunate 44 yo lady. 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  She had scheduled appointment at [facility] today with MD and with genetics counselor, and was to have had sutures removed by dental medicine. With several worsening acute problems, she was instead transported by EMS to [facility] ED, then admitted.   Significant comorbid conditions include chronic pulmonary disease with recurrent pneumonias and frequent treatment with antibiotics and steroids, and chronic pain since tractor accident in childhood, on MS Contin 30 mg bid with other pain meds, by PCP. Mother assists with pain medication. She is followed closely by Dr [name] of pulmonary and PCP Dr Holley Raring, whom she saw last week.  Patient seen with mother and daughter present. I have discussed with Dr Grandville Silos on unit.   I had seen patient last on 05-27-15, with plans to resume chemotherapy with single agent taxol after addressing severe, extensive dental problems and when stable from standpoint of respiratory infections.  At that visit she was complaining of worsening dental problems with multiple broken teeth and rampant caries; she also was again on antibiotics and steroids for lower pulmonary infection. She had full dental extractions by Dr Enrique Sack on 06-14-15 under general anesthesia, tolerated without problems, no longer any pain and gums healing. Per family, she had complained of headaches prior to the dental extractions,  thought dental related then. Following the dental procedure, she had worsening headaches, some confusion, several falls and some difficulty with speech. She has had diarrhea x4 days, incontinent. She has had intermittent vomiting, tho was able to eat some soft foods and drink liquids. She has had cough productive of yellow sputum this week, no fever. LLE where she has had lymphedema has had increased swelling. She is incontinent of urine when coughs. She may have had intermittent fevers, no shaking chills. She does not complain of neck pain.   ONCOLOGIC HISTORY  Patient presented with >= 1 year of abdominal pain, which was evaluated variously during that time at EDs [place], [place], including CTs. Patient was post vaginal hysterectomy ~ 2005 for cervical dysplasia. In 05-2014 she was found to have gyn abnormalities on CT, referred to Dr [name]. She had pulmonary evaluation by Dr [name] prior to surgery, then exploratory laparotomy 06-03-14  at [facility] with BSO, tumor debulking and right pelvic node evaluation. 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 She was hospitalized again Nov 8-14, 2015 at [place], with pelvic abscess. She received first chemotherapy with weekly taxol carboplatin on 08-05-14 by Dr [name]. She was admitted 08-23-14 with respiratory symptoms and possible gastritis/ colitis, with ANC nadir 0.41 during that hospitalization. She received cycle 2 taxol carboplatin 09-02-14, then was again admitted to [facility] 09-04-14 with acute  respiratory  failure, requiring several days on ventilator in ICU. At visit with Dr [name] 09-22-14, pulmonary situation was not stable for resuming chemotherapy. She has been followed with observation by gyn oncology and medical oncology at [facility] since 09-2014.  CT AP 05-19-15 showed progressive disease, with plan to attempt single agent carboplatin after full dental extractions, this done by Dr Enrique Sack on 06-14-15.   ALLERGIES: Penicillins  PAST MEDICAL/ SURGICAL HISTORY:  G6P1, C section Asthma since childhood Bronchoscopy at [facility] 2010-11-26 no endobronchial lesions "run over by tractor" in childhood with fractured pelvis and vertebrae, chronic pain since early 11-26-22. 3 knee surgeries, bilateral "ulcerative colitis" 26-Nov-1998 Hypothyroid since 11/26/22 Laparoscopic cholecystectomy Nov 26, 1998 Vaginal hysterectomy for cervical dysplasia ~ 2003-11-26 Colonoscopy by Dr Virgel Bouquet 2011/11/26 and by Dr Gordy Clement [phone]. Mammograms age 63 CT head July 2012 [place] no acute intracranial process   SOCIAL HISTORY:  From [place], single, lives with mother and 24 yo daughter [place]. Incarcerated thru 2006/11/26 for drugs. Smoked x 3 years, DCd 2013. Denies ETOH or nonprescription drugs now; mother assists with pain medication from PCP. Worked as Engineer, production, now disabled.  FAMILY HISTORY:  Mother with ovarian cancer age 49 (?) and invasive ductal breast 11-26-98 Maternal first cousin died of colon cancer 81s Brother with thyroid disease   Objective: Vital signs in last 24 hours: Blood pressure 150/79, pulse 92, temperature 98.4 F (36.9 C), temperature source Oral, resp. rate 12, height 5\' 2"  (1.575 m), weight 225 lb (102.059 kg), SpO2 97 %.  Awake, alert, looks ill but not in acute distress lying supine on room air. Recognizes me, is able to give mostly accurate history, with mother assisting. Speech a little slurred but intelligible.  Cushingoid faces. PERRL, not icteric. Oral mucosa moist, gums closed  with sutures in place, posterior pharynx clear. Neck supple without JVD. Audible wheezing. Respirations not labored, no cough now. Breath sounds heard bilaterally, some scattered crackles. Heart RRR. PAC right anterior chest tilted, not tender. Abdomen + BS, soft, slightly distended, not tender. LE 1+ swelling left lower leg and foot, none on right, no cords and no tenderness. Feet warm. Moves all extremities in bed. Skin without rash, ecchymoses, petechiae      Lab Results:  Recent Labs  06/23/15 1335  WBC 7.6  HGB 9.6*  HCT 31.2*  PLT 277   BMET  Recent Labs  06/23/15 1457  NA 134*  K 3.8  CL 98*  CO2 27  GLUCOSE 82  BUN 11  CREATININE 0.88  CALCIUM 8.7*   Iron, B12, folate pending  UA specific gravity 1.006, negative leukocytes, hemoglobin, protein Urine culture pending Troponin <0.03 ABG with pH 7.366, CO2 47, O2 92.9 Urine drug screen + opiates, benzo  Stool for C difficile ordered  Studies/Results: Dg Chest 2 View  06/23/2015  CLINICAL DATA:  Shortness of breath, COPD EXAM: CHEST  2 VIEW COMPARISON:  01/17/2015 FINDINGS: Study is limited by poor inspiration. Right IJ Port-A-Cath in place. Central mild vascular congestion without pulmonary edema. Mild basilar atelectasis. No segmental infiltrate. IMPRESSION: Limited study by poor inspiration. Right IJ Port-A-Cath in place. Mild basilar atelectasis. No convincing pulmonary edema. Electronically Signed   By: Lahoma Crocker M.D.   On: 06/23/2015 14:30   Ct Head Wo Contrast  06/23/2015  CLINICAL DATA:  Lethargy, headache EXAM: CT HEAD WITHOUT CONTRAST TECHNIQUE: Contiguous axial images were obtained from the base of the skull through the vertex without intravenous contrast. COMPARISON:  04/01/2013 FINDINGS:  This is vasogenic edema in right temporal lobe and right frontal lobe. There is about 7 mm right to left midline shift. Cystic lesion in right frontal lobe measures 3.8 cm. Cystic lesion in right temporal lobe measures 4.6  cm. Second cystic lesion in right frontal lobe measures 2.1 cm. High density lesion in left parietal lobe measures 1 cm. There is a lesion in the region of left caudate measures 1 cm. Findings highly suspicious for metastatic disease. No intracranial hemorrhage. IMPRESSION: Multiple brain lesion as described above highly suspicious for metastatic disease. No intracranial hemorrhage. There is about 7 mm right to left midline shift. These results were called by telephone at the time of interpretation on 06/23/2015 at 2:38 pm to Dr. Jola Schmidt , who verbally acknowledged these results. Electronically Signed   By: Lahoma Crocker M.D.   On: 06/23/2015 14:39   PACs images reviewed.by MD  DISCUSSION: all of above interval history reviewed, discussed findings on CT head today. I have told patient and family that situation is very serious, likely metastatic ovarian cancer, with less likely metastatic disease from another primary, or less likely infectious. She feels that she can tolerate MRI head, ordered now by Dr Grandville Silos.  We have discussed her wishes as to life support and resuscitation. At least in situation of "vegetative state" she does not want life support.   Assessment/Plan: 1.multiple bilateral cystic brain lesions: (not present by report from CT head 2012 in outside study). Radiologist feels most consistent with metastatic disease, which is not typical for ovarian but can be seen. Nothing on CXR today of concern for second primary in lungs. She is somewhat immunocompromised due to very frequent treatment with extended courses of steroids, so possibly infectious. Will get MRI brain to see if that also appears most likely metastatic. Agree with steroids for now as begun in ED, especially with some midline shift on CT. Whole brain RT would be appropriate if metastatic.  Suggest asking palliative care to see, particularly for discussion of goals of care. 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 Recurrent disease by recent CT, holding chemo with new apparent brain mets and other active problems 3.evere chronic lung disease with recurrent infections and previous ventilator support after cycle 2 adjuvant chemotherapy. Will send sputum culture. Nebs already ordered. I will update her pulmonologist Dr [name] by this note 4.diarrhea x 4 days: multiple courses of antibiotics including quinolones, and steroids in last several months. Stool for C diff ordered. 5.PAC in: tilted, may need [place] access  6.chronic pain since tractor accident in childhood: outpatient pain meds by PCP with assistance of mother. Per my previous notes, past incarceration for drugs. 7.full dental extractions 06-14-15 8.family history breast and ovarian cancer in mother. I will let [place] genetics counselors know of admission. Note testing could also be done on mother as index. 9.flu vaccine done 04-21-15 10. Advance Directives not completed, however patient does not want to be maintained on life support in irreversible situation. Further discussion still needed. Palliative Care discussion likely will be very helpful. 11.morbid obesity, GERD, lap cholecystectomy, vaginal hysterectomy for cervical indications, hypothyroid. 12.swelling LLE: has been somewhat chronic tho increased now, which could be related to progressive metastatic disease in pelvis. Had venous doppler negative for DVT prior to transfer of care to [facility], would recheck this if more concerns. On prophylactic lovenox now.  All questions answered. Mother will be at home tonight, best contact is home phone.   Please page me if needed between my rounds [phone]. Cc this note to Drs Holley Raring and [name] Thank you LIVESAY,LENNIS P

## 2015-06-23 NOTE — H&P (Signed)
[facility] History and Physical  Debra [name] OQH:[id number] DOB: 13-Jan-1971 DOA: 06/23/2015  Referring physician: Dr. Venora Maples PCP: Debra Spanish, MD  Oncology: Dr. Candace Gallus  Chief Complaint: Lethargy, confusion  HPI: Debra [name] is a 44 y.o. 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 Enrique Sack 06/14/2015, chronic respiratory failure on 3 L nasal cannula, morbid obesity, hypothyroidism, chronic pain secondary to tractor accident, gastroesophageal reflux disease, depression and anxiety, asthma who presents to the ED with worsening severe frontal headaches, dizzy spells, gait abnormalities leading to frequent falls that have worsened over the past few days Pap and ongoing for approximately a month and a half. Patient has had worsening memory issues as well as some nausea, an episode of emesis one day prior to admission, chills, cough productive of little yellowish sputum, fever with a temperature 100.2 days prior to admission, wheezing, shortness of breath, chest pain with coughing, lethargy, dysuria, nausea. Patient denied any melena, no hematemesis, no hematochezia. No chest pain. Patient was seen in the ED comprehensive metabolic profile done under sodium of 134 chloride of 98 otherwise was within normal limits. First troponin was negative. CBC had a hemoglobin of 9.6 otherwise was within normal limits. CT head which was done showed multiple brain lesions highly suspicious for metastatic disease with about a 7 mm right-to-left midline shift. No intracranial hemorrhage. Chest x-ray did not show any acute abnormalities. EKG showed normal sinus rhythm. Patient was given a  dose of Decadron 10 mg IV 1. ED physician stated he spoke with oncology who requested medicine admission and will consult on the patient for further management. [facility] were called to admit the patient for further evaluation and management.   Review of Systems: As per HPI o/w negative Constitutional:  No weight loss, night sweats, Fevers, chills, fatigue.  HEENT:  No headaches, Difficulty swallowing,Tooth/dental problems,Sore throat,  No sneezing, itching, ear ache, nasal congestion, post nasal drip,  Cardio-vascular:  No chest pain, Orthopnea, PND, swelling in lower extremities, anasarca, dizziness, palpitations  GI:  No heartburn, indigestion, abdominal pain, nausea, vomiting, diarrhea, change in bowel habits, loss of appetite  Resp:  No shortness of breath with exertion or at rest. No excess mucus, no productive cough, No non-productive cough, No coughing up of blood.No change in color of mucus.No wheezing.No chest wall deformity  Skin:  no rash or lesions.  GU:  no dysuria, change in color of urine, no urgency or frequency. No flank pain.  Musculoskeletal:  No joint pain or swelling. No decreased range of motion. No back pain.  Psych:  No change in mood or affect. No depression or anxiety. No memory loss.   Past Medical History  Diagnosis Date  . Asthma   . Ulcerative colitis   . Anxiety   . Depression   . Chronic back pain   . Chronic knee pain   . Thyroid disease     hypo  . GERD (gastroesophageal reflux disease)   . Insomnia   . Allergic rhinitis   . Arthritis   . COPD (chronic obstructive pulmonary disease) ([place])   . Mouth pain     jaw and mouth pain 06-13-15  . Cancer [facility])     Ovarian cancer 10'15- Dr.  Liversay follows. Chemo last 1'16, additional planned .  Marland Kitchen Chronic respiratory failure ([place]): 3L [place] 06/23/2015   Past Surgical History  Procedure Laterality Date  . Knee surgery      x3, two on left and one on right.  Arthoscopy  . Cholecystectomy     . Abdominal hysterectomy    . Cesarean section    . Tonsillectomy    . Joint replacement      2x on left, 1x on right  . Portacath placement Right     06-13-15 remains inplace  . Multiple extractions with alveoloplasty N/A 06/14/2015    Procedure: Extraction of tooth #'s 2, 4-14, 20-29 with alveoloplasty;  Surgeon: Lenn Cal, DDS;  Location: [facility];  Service: Oral Surgery;  Laterality: N/A;   Social History:  reports that she quit smoking about 3 years ago. Her smoking use included Cigarettes. She has a 3 pack-year smoking history. She has never used smokeless tobacco. She reports that she does not drink alcohol or use illicit drugs.  Allergies  Allergen Reactions  . Zofran [Ondansetron Hcl]     Headache  . Penicillins Swelling and Rash    Has patient had a PCN reaction causing immediate rash, facial/tongue/throat swelling, SOB or lightheadedness with hypotension: yes Has patient had a PCN reaction causing severe rash involving mucus membranes or skin necrosis: unknown Has patient had a PCN reaction that required hospitalization yes Has patient had a PCN reaction occurring within the last 10 years: yes If all of the above answers are "NO", then may proceed with Cephalosporin use.   . Sulfamethoxazole-Trimethoprim Nausea Only    Family History  Problem Relation Age of Onset  . High blood pressure Mother   . Bipolar disorder Mother   . Hypothyroidism Mother   . Rheum arthritis Mother   . Breast cancer Mother   . Ovarian cancer Mother   . Asthma Father   . Parkinson's disease Father   . CAD Father    father deceased age 61 from Parkinson's disease. Father also had bypass surgery. Mother alive is a over and a breast cancer survivor. History of hypertension, coronary artery disease, hypothyroidism, rheumatoid arthritis, bipolar disorder.  Prior to Admission medications   Medication Sig Start Date End Date Taking? Authorizing Provider  albuterol (PROAIR HFA) 108 (90  BASE) MCG/ACT inhaler Inhale 2 puffs into the lungs every 4 (four) hours as needed for wheezing or shortness of breath. 02/28/14  Yes Shanker Kristeen Mans, MD  ALPRAZolam Duanne Moron) 0.5 MG tablet Take 0.5 mg by mouth 2 (two) times daily.   Yes Historical Provider, MD  benzonatate (TESSALON) 100 MG capsule Take 100 mg by mouth 3 (three) times daily as needed for cough.  01/03/15  Yes Historical Provider, MD  buPROPion (WELLBUTRIN SR) 150 MG 12 hr tablet Take 150 mg by mouth 2 (two) times daily. 06/21/15  Yes Historical Provider, MD  citalopram (CELEXA) 40 MG tablet Take 40 mg by mouth every morning.    Yes Historical Provider, MD  Dextromethorphan-Guaifenesin (MUCINEX DM) 30-600 MG TB12 Take 2 tablets by mouth every 6 (six) hours as needed (for cough/congestion).   Yes Historical Provider, MD  Fluticasone-Salmeterol (ADVAIR) 500-50 MCG/DOSE AEPB Inhale 2 puffs into the lungs 2 (two) times daily.    Yes Historical Provider, MD  furosemide (LASIX) 40 MG tablet Take 40 mg by mouth daily.   Yes Historical Provider, MD  ipratropium-albuterol (DUONEB) 0.5-2.5 (3) MG/3ML SOLN Take 3 mLs by nebulization every 6 (six) hours  as needed (for wheezing. Take three times daily for  next 3 days). 02/28/14  Yes Shanker Kristeen Mans, MD  levothyroxine (SYNTHROID, LEVOTHROID) 175 MCG tablet Take 175 mcg by mouth daily before breakfast.   Yes Historical Provider, MD  lidocaine-prilocaine (EMLA) cream Apply 1 application topically daily as needed. 07/07/14  Yes Historical Provider, MD  montelukast (SINGULAIR) 10 MG tablet Take 10 mg by mouth at bedtime.    Yes Historical Provider, MD  morphine (MS CONTIN) 30 MG 12 hr tablet Take 30 mg by mouth 2 (two) times daily. 06/17/15  Yes Historical Provider, MD  morphine (MSIR) 15 MG tablet Take 15 mg by mouth 2 (two) times daily. 06/17/15  Yes Historical Provider, MD  omeprazole (PRILOSEC) 40 MG capsule Take 40 mg by mouth 2 (two) times daily.    Yes Historical Provider, MD    oxyCODONE-acetaminophen (PERCOCET) 5-325 MG tablet Take one or two tablets by mouth every 4-6 hours as needed for pain. 06/14/15  Yes Lenn Cal, DDS  OXYGEN Inhale 3 L/min into the lungs every evening.    Yes Historical Provider, MD  predniSONE (DELTASONE) 10 MG tablet Take 10 mg by mouth daily with breakfast.   Yes Historical Provider, MD  prochlorperazine (COMPAZINE) 10 MG tablet Take 1 tablet (10 mg total) by mouth every 6 (six) hours as needed for nausea or vomiting. 05/27/15  Yes Lennis Marion Downer, MD  promethazine (PHENERGAN) 25 MG tablet Take 1 tablet (25 mg total) by mouth every 6 (six) hours as needed for nausea or vomiting. 05/27/15  Yes Lennis Marion Downer, MD  Tiotropium Bromide Monohydrate (SPIRIVA RESPIMAT) 2.5 MCG/ACT AERS Inhale 2 puffs into the lungs 2 (two) times daily.   Yes Historical Provider, MD  traZODone (DESYREL) 100 MG tablet Take 100 mg by mouth at bedtime as needed for sleep.    Yes Historical Provider, MD  levofloxacin (LEVAQUIN) 500 MG tablet Take 500 mg by mouth daily.    Historical Provider, MD   Physical Exam: Filed Vitals:   06/23/15 1300 06/23/15 1341 06/23/15 1358 06/23/15 1657  BP: 134/73   150/79  Pulse: 90  95 92  Temp:    98.4 F (36.9 C)  TempSrc:    Oral  Resp: 14  8 12   SpO2: 100% 93% 95% 98%    Wt Readings from Last 3 Encounters:  06/14/15 101.833 kg (224 lb 8 oz)  06/13/15 101.833 kg (224 lb 8 oz)  05/27/15 106.278 kg (234 lb 4.8 oz)    General:  Well-developed well-nourished laying on gurney in no acute cardiopulmonary distress. Somewhat drowsy. Speaking in full sentences.  Eyes: PERRLA, EOMI, normal lids, irises & conjunctiva ENT: grossly normal hearing, lips & tongue. Dry mucous membranes. Neck: no LAD, masses or thyromegaly Cardiovascular: RRR, no m/r/g. Chronic left lower extremity lymphedema with TED hose. Respiratory: CTA bilaterally, no w/r/r. Normal respiratory effort. Abdomen: soft, ntnd, positive bowel sounds, no rebound, no  guarding Skin: no rash or induration seen on limited exam Musculoskeletal: grossly normal tone BUE/BLE Psychiatric: grossly normal mood and affect, speech fluent and appropriate Neurologic: Alert. Cranial nerves II through XII grossly intact. Visual fields intact. Sensation is intact. Unable to elicit reflexes symmetrically and diffusely. Gait not tested secondary to safety.           Labs on Admission:  Basic Metabolic Panel:  Recent Labs Lab 06/23/15 1457  NA 134*  K 3.8  CL 98*  CO2 27  GLUCOSE 82  BUN 11  CREATININE  0.88  CALCIUM 8.7*   Liver Function Tests:  Recent Labs Lab 06/23/15 1457  AST 15  ALT 12*  ALKPHOS 54  BILITOT 0.3  PROT 6.7  ALBUMIN 3.4*   No results for input(s): LIPASE, AMYLASE in the last 168 hours. No results for input(s): AMMONIA in the last 168 hours. CBC:  Recent Labs Lab 06/23/15 1335  WBC 7.6  NEUTROABS 4.5  HGB 9.6*  HCT 31.2*  MCV 87.2  PLT 277   Cardiac Enzymes:  Recent Labs Lab 06/23/15 1457  TROPONINI <0.03    BNP (last 3 results) No results for input(s): BNP in the last 8760 hours.  ProBNP (last 3 results) No results for input(s): PROBNP in the last 8760 hours.  CBG: No results for input(s): GLUCAP in the last 168 hours.  Radiological Exams on Admission: Dg Chest 2 View  06/23/2015  CLINICAL DATA:  Shortness of breath, COPD EXAM: CHEST  2 VIEW COMPARISON:  01/17/2015 FINDINGS: Study is limited by poor inspiration. Right IJ Port-A-Cath in place. Central mild vascular congestion without pulmonary edema. Mild basilar atelectasis. No segmental infiltrate. IMPRESSION: Limited study by poor inspiration. Right IJ Port-A-Cath in place. Mild basilar atelectasis. No convincing pulmonary edema. Electronically Signed   By: Lahoma Crocker M.D.   On: 06/23/2015 14:30   Ct Head Wo Contrast  06/23/2015  CLINICAL DATA:  Lethargy, headache EXAM: CT HEAD WITHOUT CONTRAST TECHNIQUE: Contiguous axial images were obtained from the base  of the skull through the vertex without intravenous contrast. COMPARISON:  04/01/2013 FINDINGS: This is vasogenic edema in right temporal lobe and right frontal lobe. There is about 7 mm right to left midline shift. Cystic lesion in right frontal lobe measures 3.8 cm. Cystic lesion in right temporal lobe measures 4.6 cm. Second cystic lesion in right frontal lobe measures 2.1 cm. High density lesion in left parietal lobe measures 1 cm. There is a lesion in the region of left caudate measures 1 cm. Findings highly suspicious for metastatic disease. No intracranial hemorrhage. IMPRESSION: Multiple brain lesion as described above highly suspicious for metastatic disease. No intracranial hemorrhage. There is about 7 mm right to left midline shift. These results were called by telephone at the time of interpretation on 06/23/2015 at 2:38 pm to Dr. Jola Schmidt , who verbally acknowledged these results. Electronically Signed   By: Lahoma Crocker M.D.   On: 06/23/2015 14:39    EKG: Independently reviewed. Normal sinus rhythm  Assessment/Plan Principal Problem:   Brain metastasis (HCC) Active Problems:   Anxiety   Depression   Thyroid disease   GERD (gastroesophageal reflux disease)   Normocytic anemia   COPD (chronic obstructive pulmonary disease) (HCC)   Anemia   Ovarian cancer, bilateral (HCC)   Chronic pain due to injury   Asthma, moderate persistent   Obesity, morbid, BMI 40.0-49.9 (HCC)   On home oxygen therapy   Acute encephalopathy   Ataxia   Chronic respiratory failure ([place]): 3L [place]   Metastasis to brain [facility])  #1 brain metastases Per CT head. 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 Patient presenting with some confusion, dizzy spells, severe headaches, gait abnormalities which are likely a result of brain metastases noted on head CT. Patient has been given a dose of Decadron 10 mg IV 1. Will place patient on Decadron 10  mg IV every 6. Oncology has been informed of patient's admission per ED physician and will be consulting on the patient.  Patient may need radiation treatment and further restaging. Per oncology.  #2 acute encephalopathy/ataxia Likely secondary to problem #1. Chest x-ray is negative for any acute infectious process. Will check a UA with cultures and sensitivities. Check blood cultures 2. Patient be placed on IV Decadron as in problem #1. Follow.  #3 chronic respiratory failure on 3 L nasal cannula Stable. Chest x-ray negative for any acute infiltrate or pulmonary edema. Patient's examination was unremarkable. Continue home regimen of Spiriva and Advair. Nebulizer treatments.  #4 gastroesophageal reflux disease PPI.  #5 depression/anxiety Stable. Continue home regimen of Xanax, Celexa, Wellbutrin.  #6 hypothyroidism Patient states that Synthroid dose was recently increased per PCP approximately a month ago. Continue home regimen Synthroid. Outpatient follow-up.  #7 chronic pain syndrome Continue home regimen of MS Contin twice daily. Percocet as needed.  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  #9 anemia No overt bleeding. Check an anemia panel. Follow H&H. Transfusion threshold hemoglobin less than 7.  #10 prophylaxis PPI for GI prophylaxis. Lovenox for DVT prophylaxis.    Code Status: Full DVT Prophylaxis: Lovenox. Family Communication: Updated patient, mother, daughter at bedside. Disposition Plan: Admit to [facility].  Time spent: [facility] Pager [phone]

## 2015-06-23 NOTE — ED Notes (Signed)
Vitals signs was done at 12:45 not 0045

## 2015-06-23 NOTE — Telephone Encounter (Signed)
06/23/2015  Patient:            Debra [name] Date of Birth:  08/06/1971 MRN:                [id number]   Broken appointment for suture removal. Called and talked with patient's mother at home. Mother indicates patient picked up by EMS to transfer to emergency Department for evaluation for syncope. Patient or mother will call back to reschedule a follow-up dental appointment. Lattie Haw [place]/Dr. Enrique Sack

## 2015-06-23 NOTE — ED Notes (Addendum)
Per report:  Pt from home, has had increasing lethargy and shob, wheezing over the last 2 days with fever.  EMS gave albuterol 10mg  & atrovent 1mg  total over 2 treatments.  Pupils constricted but reactive per ems.  Pt is on morphine at home and axox4 per ems.  VS: 130/p, 90, 18, 96%RA, CBG 144.   Upon assessment, pt is lethargic, AXO to person and place and reoriented to time.  Audible wheezing.  LT leg pain, swelling and slight redness/warmth noted. Pt had all her teeth pulled on 06/14/15 and had f/u visit oncologist and dentist scheduled for today.  States her mouth is hurting as well. Had teeth pulled as a prophylactic per pt b/c of her cancer treatments and risk of infection.

## 2015-06-23 NOTE — ED Provider Notes (Signed)
CSN: [id number]     Arrival date & time 06/23/15  1248 History   First MD Initiated Contact with Patient 06/23/15 1335     Chief Complaint  Patient presents with  . Fatigue  . Shortness of Breath      HPI Patient has a history of ovarian cancer with recent diagnosis of recurrent ovarian cancer.  She presents to the emergency department 2-3 days of worsening headache and family reports confusion and ataxia.  This is abnormal for the patient.  EMS was called and found the patient to be wheezing and gave her albuterol in route.  She states that she was slightly short of breath but feels better at this time.  She denies change in her medications.  Denies alcohol and drug use.  She states she had a recent dental procedure for dental extractions for multiple dental caries prior to initiation of chemotherapy.  Patient does have morphine at home for pain reports no change in her dosing regimen.   Past Medical History  Diagnosis Date  . Asthma   . Ulcerative colitis   . Anxiety   . Depression   . Chronic back pain   . Chronic knee pain   . Thyroid disease     hypo  . GERD (gastroesophageal reflux disease)   . Insomnia   . Allergic rhinitis   . Arthritis   . COPD (chronic obstructive pulmonary disease) ([place])   . Mouth pain     jaw and mouth pain 06-13-15  . Cancer [facility])     Ovarian cancer 10'15- Dr. Curlene Dolphin follows. Chemo last 1'16, additional planned .   Past Surgical History  Procedure Laterality Date  . Knee surgery      x3, two on left and one on right.  Arthoscopy  . Cholecystectomy    . Abdominal hysterectomy    . Cesarean section    . Tonsillectomy    . Joint replacement      2x on left, 1x on right  . Portacath placement Right     06-13-15 remains inplace  . Multiple extractions with alveoloplasty N/A 06/14/2015    Procedure: Extraction of tooth #'s 2, 4-14, 20-29 with alveoloplasty;  Surgeon: Lenn Cal, DDS;  Location: [facility];  Service: Oral Surgery;   Laterality: N/A;   Family History  Problem Relation Age of Onset  . High blood pressure Mother   . Bipolar disorder Mother   . Asthma Father   . Parkinson's disease Father    Social History  Substance Use Topics  . Smoking status: Former Smoker -- 1.00 packs/day for 3 years    Types: Cigarettes    Quit date: 10/19/2011  . Smokeless tobacco: Never Used  . Alcohol Use: No     Comment: rarely    OB History    No data available     Review of Systems  All other systems reviewed and are negative.     Allergies  Zofran; Penicillins; and Sulfamethoxazole-trimethoprim  Home Medications   Prior to Admission medications   Medication Sig Start Date End Date Taking? Authorizing Provider  albuterol (PROAIR HFA) 108 (90 BASE) MCG/ACT inhaler Inhale 2 puffs into the lungs every 4 (four) hours as needed for wheezing or shortness of breath. 02/28/14  Yes Shanker Kristeen Mans, MD  ALPRAZolam Duanne Moron) 0.5 MG tablet Take 0.5 mg by mouth 2 (two) times daily.   Yes Historical Provider, MD  benzonatate (TESSALON) 100 MG capsule Take 100 mg by mouth 3 (three)  times daily as needed for cough.  01/03/15  Yes Historical Provider, MD  buPROPion (WELLBUTRIN SR) 150 MG 12 hr tablet Take 150 mg by mouth 2 (two) times daily. 06/21/15  Yes Historical Provider, MD  citalopram (CELEXA) 40 MG tablet Take 40 mg by mouth every morning.    Yes Historical Provider, MD  Dextromethorphan-Guaifenesin (MUCINEX DM) 30-600 MG TB12 Take 2 tablets by mouth every 6 (six) hours as needed (for cough/congestion).   Yes Historical Provider, MD  Fluticasone-Salmeterol (ADVAIR) 500-50 MCG/DOSE AEPB Inhale 2 puffs into the lungs 2 (two) times daily.    Yes Historical Provider, MD  furosemide (LASIX) 40 MG tablet Take 40 mg by mouth daily.   Yes Historical Provider, MD  ipratropium-albuterol (DUONEB) 0.5-2.5 (3) MG/3ML SOLN Take 3 mLs by nebulization every 6 (six) hours as needed (for wheezing. Take three times daily for  next 3 days).  02/28/14  Yes Shanker Kristeen Mans, MD  levothyroxine (SYNTHROID, LEVOTHROID) 175 MCG tablet Take 175 mcg by mouth daily before breakfast.   Yes Historical Provider, MD  lidocaine-prilocaine (EMLA) cream Apply 1 application topically daily as needed. 07/07/14  Yes Historical Provider, MD  montelukast (SINGULAIR) 10 MG tablet Take 10 mg by mouth at bedtime.    Yes Historical Provider, MD  morphine (MS CONTIN) 30 MG 12 hr tablet Take 30 mg by mouth 2 (two) times daily. 06/17/15  Yes Historical Provider, MD  morphine (MSIR) 15 MG tablet Take 15 mg by mouth 2 (two) times daily. 06/17/15  Yes Historical Provider, MD  omeprazole (PRILOSEC) 40 MG capsule Take 40 mg by mouth 2 (two) times daily.    Yes Historical Provider, MD  oxyCODONE-acetaminophen (PERCOCET) 5-325 MG tablet Take one or two tablets by mouth every 4-6 hours as needed for pain. 06/14/15  Yes Lenn Cal, DDS  OXYGEN Inhale 3 L/min into the lungs every evening.    Yes Historical Provider, MD  predniSONE (DELTASONE) 10 MG tablet Take 10 mg by mouth daily with breakfast.   Yes Historical Provider, MD  prochlorperazine (COMPAZINE) 10 MG tablet Take 1 tablet (10 mg total) by mouth every 6 (six) hours as needed for nausea or vomiting. 05/27/15  Yes Lennis Marion Downer, MD  promethazine (PHENERGAN) 25 MG tablet Take 1 tablet (25 mg total) by mouth every 6 (six) hours as needed for nausea or vomiting. 05/27/15  Yes Lennis Marion Downer, MD  Tiotropium Bromide Monohydrate (SPIRIVA RESPIMAT) 2.5 MCG/ACT AERS Inhale 2 puffs into the lungs 2 (two) times daily.   Yes Historical Provider, MD  traZODone (DESYREL) 100 MG tablet Take 100 mg by mouth at bedtime as needed for sleep.    Yes Historical Provider, MD  levofloxacin (LEVAQUIN) 500 MG tablet Take 500 mg by mouth daily.    Historical Provider, MD   BP 134/73 mmHg  Pulse 95  Temp(Src) 98.7 F (37.1 C) (Oral)  Resp 8  SpO2 95% Physical Exam  Constitutional: She appears well-developed and well-nourished.  No distress.  HENT:  Head: Normocephalic and atraumatic.  Eyes: EOM are normal.  Neck: Normal range of motion.  Cardiovascular: Normal rate, regular rhythm and normal heart sounds.   Pulmonary/Chest: Effort normal and breath sounds normal.  Abdominal: Soft. She exhibits no distension. There is no tenderness.  Musculoskeletal: Normal range of motion.  Neurological: She is alert.  Oriented to person and place but not time.  5 out of 5 strength bilateral upper lower extremity major muscle groups.  Skin: Skin is warm and dry.  Psychiatric: She has a normal mood and affect. Judgment normal.  Nursing note and vitals reviewed.   ED Course  Procedures (including critical care time)    Labs Review Labs Reviewed  CBC WITH DIFFERENTIAL/PLATELET - Abnormal; Notable for the following:    RBC 3.58 (*)    Hemoglobin 9.6 (*)    HCT 31.2 (*)    RDW 16.4 (*)    All other components within normal limits  COMPREHENSIVE METABOLIC PANEL - Abnormal; Notable for the following:    Sodium 134 (*)    Chloride 98 (*)    Calcium 8.7 (*)    Albumin 3.4 (*)    ALT 12 (*)    All other components within normal limits  BLOOD GAS, ARTERIAL - Abnormal; Notable for the following:    pCO2 arterial 47.6 (*)    Bicarbonate 26.6 (*)    All other components within normal limits  TROPONIN I  URINE RAPID DRUG SCREEN, HOSP PERFORMED  URINALYSIS, ROUTINE W REFLEX MICROSCOPIC (NOT AT [facility])    Imaging Review Dg Chest 2 View  06/23/2015  CLINICAL DATA:  Shortness of breath, COPD EXAM: CHEST  2 VIEW COMPARISON:  01/17/2015 FINDINGS: Study is limited by poor inspiration. Right IJ Port-A-Cath in place. Central mild vascular congestion without pulmonary edema. Mild basilar atelectasis. No segmental infiltrate. IMPRESSION: Limited study by poor inspiration. Right IJ Port-A-Cath in place. Mild basilar atelectasis. No convincing pulmonary edema. Electronically Signed   By: Lahoma Crocker M.D.   On: 06/23/2015 14:30   Ct Head  Wo Contrast  06/23/2015  CLINICAL DATA:  Lethargy, headache EXAM: CT HEAD WITHOUT CONTRAST TECHNIQUE: Contiguous axial images were obtained from the base of the skull through the vertex without intravenous contrast. COMPARISON:  04/01/2013 FINDINGS: This is vasogenic edema in right temporal lobe and right frontal lobe. There is about 7 mm right to left midline shift. Cystic lesion in right frontal lobe measures 3.8 cm. Cystic lesion in right temporal lobe measures 4.6 cm. Second cystic lesion in right frontal lobe measures 2.1 cm. High density lesion in left parietal lobe measures 1 cm. There is a lesion in the region of left caudate measures 1 cm. Findings highly suspicious for metastatic disease. No intracranial hemorrhage. IMPRESSION: Multiple brain lesion as described above highly suspicious for metastatic disease. No intracranial hemorrhage. There is about 7 mm right to left midline shift. These results were called by telephone at the time of interpretation on 06/23/2015 at 2:38 pm to Dr. Jola Schmidt , who verbally acknowledged these results. Electronically Signed   By: Lahoma Crocker M.D.   On: 06/23/2015 14:39   I have personally reviewed and evaluated these images and lab results as part of my medical decision-making.   EKG Interpretation   Date/Time:  Thursday June 23 2015 13:04:11 EDT Ventricular Rate:  92 PR Interval:  153 QRS Duration: 77 QT Interval:  356 QTC Calculation: 440 R Axis:   53 Text Interpretation:  Sinus rhythm No significant change was found  Confirmed by Satonya Lux  MD, Lennette Bihari ([id number]) on 06/23/2015 4:41:31 PM      MDM   Final diagnoses:  Ovarian cancer, bilateral ([place])  Brain metastasis ([place])  Ataxia    Spoke with Dr Marko Plume who will see the patient in consultation in hospital. IV decadron now. Admit to med surg. May need restaging of her cancer. Afebrile.     Jola Schmidt, MD 06/23/15 [phone]

## 2015-06-24 ENCOUNTER — Ambulatory Visit
Admit: 2015-06-24 | Discharge: 2015-06-24 | Disposition: A | Discharge status: Home / Self Care | Payer: Medicaid Other | Source: Ambulatory Visit | Attending: Radiation Oncology | Admitting: Radiation Oncology

## 2015-06-24 ENCOUNTER — Ambulatory Visit
Admit: 2015-06-24 | Discharge: 2015-06-24 | Disposition: A | Discharge status: Home / Self Care | Payer: Medicaid Other | Attending: Radiation Oncology | Admitting: Radiation Oncology

## 2015-06-24 ENCOUNTER — Inpatient Hospital Stay (HOSPITAL_COMMUNITY): Payer: Medicaid Other

## 2015-06-24 DIAGNOSIS — K0820 Unspecified atrophy of edentulous alveolar ridge: Secondary | ICD-10-CM

## 2015-06-24 DIAGNOSIS — C7931 Secondary malignant neoplasm of brain: Secondary | ICD-10-CM

## 2015-06-24 DIAGNOSIS — K08109 Complete loss of teeth, unspecified cause, unspecified class: Secondary | ICD-10-CM

## 2015-06-24 DIAGNOSIS — K219 Gastro-esophageal reflux disease without esophagitis: Secondary | ICD-10-CM

## 2015-06-24 DIAGNOSIS — D649 Anemia, unspecified: Secondary | ICD-10-CM

## 2015-06-24 DIAGNOSIS — F419 Anxiety disorder, unspecified: Secondary | ICD-10-CM | POA: Insufficient documentation

## 2015-06-24 DIAGNOSIS — Z515 Encounter for palliative care: Secondary | ICD-10-CM

## 2015-06-24 DIAGNOSIS — C563 Malignant neoplasm of bilateral ovaries: Secondary | ICD-10-CM

## 2015-06-24 DIAGNOSIS — R609 Edema, unspecified: Secondary | ICD-10-CM

## 2015-06-24 DIAGNOSIS — R270 Ataxia, unspecified: Secondary | ICD-10-CM

## 2015-06-24 DIAGNOSIS — C562 Malignant neoplasm of left ovary: Secondary | ICD-10-CM

## 2015-06-24 DIAGNOSIS — C561 Malignant neoplasm of right ovary: Secondary | ICD-10-CM

## 2015-06-24 LAB — COMPREHENSIVE METABOLIC PANEL
ALT: 13 U/L — ABNORMAL LOW (ref 14–54)
ANION GAP: 10 (ref 5–15)
AST: 19 U/L (ref 15–41)
Albumin: 3.3 g/dL — ABNORMAL LOW (ref 3.5–5.0)
Alkaline Phosphatase: 54 U/L (ref 38–126)
BUN: 12 mg/dL (ref 6–20)
CALCIUM: 9.1 mg/dL (ref 8.9–10.3)
CHLORIDE: 100 mmol/L — AB (ref 101–111)
CO2: 27 mmol/L (ref 22–32)
Creatinine, Ser: 0.74 mg/dL (ref 0.44–1.00)
Glucose, Bld: 191 mg/dL — ABNORMAL HIGH (ref 65–99)
Potassium: 4 mmol/L (ref 3.5–5.1)
SODIUM: 137 mmol/L (ref 135–145)
Total Bilirubin: 0.3 mg/dL (ref 0.3–1.2)
Total Protein: 6.7 g/dL (ref 6.5–8.1)

## 2015-06-24 LAB — CBC
HCT: 30.5 % — ABNORMAL LOW (ref 36.0–46.0)
HEMOGLOBIN: 9.4 g/dL — AB (ref 12.0–15.0)
MCH: 27.1 pg (ref 26.0–34.0)
MCHC: 30.8 g/dL (ref 30.0–36.0)
MCV: 87.9 fL (ref 78.0–100.0)
Platelets: 285 10*3/uL (ref 150–400)
RBC: 3.47 MIL/uL — AB (ref 3.87–5.11)
RDW: 16.8 % — ABNORMAL HIGH (ref 11.5–15.5)
WBC: 6.2 10*3/uL (ref 4.0–10.5)

## 2015-06-24 LAB — FOLATE: FOLATE: 20.2 ng/mL (ref >5.9)

## 2015-06-24 MED ORDER — LORAZEPAM 1 MG PO TABS
1.0000 mg | ORAL_TABLET | Freq: Once | ORAL | Status: AC
  Administered 2015-06-24: 1 mg via ORAL
  Filled 2015-06-24: qty 1

## 2015-06-24 MED ORDER — IPRATROPIUM-ALBUTEROL 0.5-2.5 (3) MG/3ML IN SOLN
3.0000 mL | Freq: Three times a day (TID) | RESPIRATORY_TRACT | Status: DC
  Administered 2015-06-24 – 2015-06-26 (×6): 3 mL via RESPIRATORY_TRACT
  Filled 2015-06-24 (×7): qty 3

## 2015-06-24 MED ORDER — MAGNESIUM SULFATE 4 GM/100ML IV SOLN
4.0000 g | Freq: Once | INTRAVENOUS | Status: AC
  Administered 2015-06-24: 4 g via INTRAVENOUS
  Filled 2015-06-24: qty 100

## 2015-06-24 MED ORDER — KETOROLAC TROMETHAMINE 30 MG/ML IJ SOLN
30.0000 mg | Freq: Four times a day (QID) | INTRAMUSCULAR | Status: AC | PRN
  Administered 2015-06-24 – 2015-06-27 (×6): 30 mg via INTRAVENOUS
  Filled 2015-06-24 (×8): qty 1

## 2015-06-24 MED ORDER — ALPRAZOLAM 0.5 MG PO TABS
0.5000 mg | ORAL_TABLET | Freq: Three times a day (TID) | ORAL | Status: DC
  Administered 2015-06-24 – 2015-06-27 (×7): 0.5 mg via ORAL
  Filled 2015-06-24 (×7): qty 1

## 2015-06-24 MED ORDER — SODIUM CHLORIDE 0.9 % IR SOLN
200.0000 mL | Status: DC
  Administered 2015-06-24 – 2015-06-26 (×2): 30 mL

## 2015-06-24 NOTE — Evaluation (Signed)
Clinical/Bedside Swallow Evaluation Patient Details  Name: Debra [name] MRN: [id number] Date of Birth: 02-18-1971  Today's Date: 06/24/2015 Time: SLP Start Time (ACUTE ONLY): 1210 SLP Stop Time (ACUTE ONLY): 1231 SLP Time Calculation (min) (ACUTE ONLY): 21 min  Past Medical History:  Past Medical History  Diagnosis Date  . Asthma   . Ulcerative colitis   . Anxiety   . Depression   . Chronic back pain   . Chronic knee pain   . Thyroid disease     hypo  . GERD (gastroesophageal reflux disease)   . Insomnia   . Allergic rhinitis   . Arthritis   . COPD (chronic obstructive pulmonary disease) ([place])   . Mouth pain     jaw and mouth pain 06-13-15  . Cancer [facility])     Ovarian cancer 10'15- Dr. Curlene Dolphin follows. Chemo last 1'16, additional planned .  Marland Kitchen Chronic respiratory failure ([place]): 3L [place] 06/23/2015   Past Surgical History:  Past Surgical History  Procedure Laterality Date  . Knee surgery      x3, two on left and one on right.  Arthoscopy  . Cholecystectomy    . Abdominal hysterectomy    . Cesarean section    . Tonsillectomy    . Joint replacement      2x on left, 1x on right  . Portacath placement Right     06-13-15 remains inplace  . Multiple extractions with alveoloplasty N/A 06/14/2015    Procedure: Extraction of tooth #'s 2, 4-14, 20-29 with alveoloplasty;  Surgeon: Lenn Cal, DDS;  Location: [facility];  Service: Oral Surgery;  Laterality: N/A;   HPI:  44 yo female adm to [facility] with vomiting/nausea.  Pt has recurrence of IIIC bilateral ovarian cancer with brain mets, s/p recent dental extraction for multiple caries.  Swallow evaluation ordered.    Assessment / Plan / Recommendation Clinical Impression  Pt presents with overall functional swallow (minimal oral deficits due to recent loss of dentition).  NO focal CN deficits noted during evaluation.    No symptoms of aspiration with all po observed.  Pt admits to mild issues with "choking" easily on liquids if she  eats too fast   She does admit that this is getting better.  Pt also reports issues with only wanting to drink liquids due to nausea.  Reflux problems also reported for which pt states she takes a PPI BID.      Aspiration Risk  Mild    Diet Recommendation Dysphagia 3 (Mech soft);Thin   Medication Administration: Whole meds with liquid Compensations: Slow rate;Small sips/bites    Other  Recommendations Oral Care Recommendations: Oral care BID   Follow Up Recommendations    n/a   Frequency and Duration   n/a     Pertinent Vitals/Pain Afebrile, decreased      Swallow Study Prior Functional Status  Type of Home: House Available Help at Discharge: Family    General Date of Onset: 06/24/15 Other Pertinent Information: 44 yo female adm [facility] with vomiting/nausea.  Pt has recurrence of IIIC bilateral ovarian cancer with brain mets, s/p recent dental extraction for multiple caries.  Swallow evaluation ordered.  Type of Study: Bedside swallow evaluation Diet Prior to this Study: Dysphagia 3 (soft);Thin liquids Temperature Spikes Noted: No Respiratory Status: Room air History of Recent Intubation: No Behavior/Cognition: Alert;Cooperative;Pleasant mood Oral Cavity - Dentition: Edentulous Self-Feeding Abilities: Able to feed self Patient Positioning: Upright in chair/Tumbleform Baseline Vocal Quality: Normal Volitional Cough: Strong Volitional  Swallow: Able to elicit    Oral/Motor/Sensory Function Overall Oral Motor/Sensory Function: Appears within functional limits for tasks assessed   Ice Chips Ice chips: Not tested   Thin Liquid Thin Liquid: Within functional limits Presentation: Straw;Self Fed    Nectar Thick Nectar Thick Liquid: Not tested   Honey Thick Honey Thick Liquid: Not tested   Puree Puree: Within functional limits Presentation: Self Fed;Spoon   Solid   GO    Solid: Within functional limits Presentation: Self Fed Other Comments: pt allowed graham cracker to  dissolve and used applesauce to facilitate oral clearance       [name], [place] [facility] SLP [phone]

## 2015-06-24 NOTE — Progress Notes (Signed)
OT Cancellation Note  Patient Details Name: Debra [name] MRN: [id number] DOB: Jan 16, 1971   Cancelled Treatment:    Reason Eval/Treat Not Completed: Patient at procedure or test/ unavailable.  Pt is at XRT. Will try to check back tomorrow.  Wednesday Ericsson 06/24/2015, 2:22 PM  Lesle Chris, OTR/L [phone] 06/24/2015

## 2015-06-24 NOTE — Progress Notes (Signed)
VASCULAR LAB PRELIMINARY  PRELIMINARY  PRELIMINARY  PRELIMINARY  Left lower extremity venous duplex completed.    Preliminary report:  Left:  No evidence of DVT, superficial thrombosis, or Baker's cyst.  Aymara Sassi, RVT 06/24/2015, 1:42 PM

## 2015-06-24 NOTE — Progress Notes (Signed)
[facility] PROGRESS NOTE  RAKAYLA RICKLEFS OIN:[id number] DOB: September 25, 1970 DOA: 06/23/2015 PCP: Guadlupe Spanish, MD  Assessment/Plan: #1 brain metastases Patient noted on head CT scan to have some brain metastases. 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 Patient with some clinical improvement. MRI of the head which was done was consistent with probable brain metastases. Continue current dose of IV Decadron. Patient has been seen by oncology and patient will be referred to radiation oncology for further evaluation. Oncology following and appreciate input and recommendations.  #2 acute encephalopathy/ataxia Secondary to problem #1. Infectious workup negative to date. Continue IV Decadron. Patient has been referred to radiation oncology for further evaluation. PT/OT.  #3 chronic respiratory failure on 3 L nasal cannula Stable. Continue Spiriva and Advair. Nebulizer treatments as needed.  #4 gastroesophageal reflux disease PPI.  #5 depression/anxiety Continue Celexa and Wellbutrin. Senna to dose has been adjusted per palliative care.  #6 hypothyroidism Continue current dose of Synthroid.  #7 chronic pain syndrome Continue MS Contin twice daily. Percocet for breakthrough pain.  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  #9 anemia Stable. Follow H&H. Transfusion threshold hemoglobin less than 7.  #10 prophylaxis PPI for GI prophylaxis. Lovenox for DVT prophylaxis.  Code Status: Full Family Communication: Updated patient and mother and daughter at bedside. Disposition Plan: Pending further evaluation of metastatic brain disease.   Consultants:  Oncology: Dr. Marko Plume 06/23/2015  Palliative care: Dr. Rowe Pavy 06/24/2015  Procedures:  CT head 06/23/2015  MRI head 06/23/2015  Chest x-ray 06/23/2015  Left lower extremity  Doppler 06/24/2015  Antibiotics:  None  HPI/Subjective: Patient states feeling some better. Patient denies any chest pain. No shortness of breath.  Objective: Filed Vitals:   06/24/15 0354  BP: 110/90  Pulse: 90  Temp: 97.9 F (36.6 C)  Resp: 14    Intake/Output Summary (Last 24 hours) at 06/24/15 1109 Last data filed at 06/24/15 0838  Gross per 24 hour  Intake   2240 ml  Output   4200 ml  Net  -1960 ml   Filed Weights   06/23/15 1657 06/24/15 0354  Weight: 102.059 kg (225 lb) 102.785 kg (226 lb 9.6 oz)    Exam:   General:  NAD  Cardiovascular: RRR  Respiratory: CTAB  Abdomen: Soft, nontender, nondistended, positive bowel sounds.  Musculoskeletal: No clubbing cyanosis. Left lower extremity lymphedema.   Data Reviewed: Basic Metabolic Panel:  Recent Labs Lab 06/23/15 1457 06/23/15 1950 06/24/15 0601  NA 134*  --  137  K 3.8  --  4.0  CL 98*  --  100*  CO2 27  --  27  GLUCOSE 82  --  191*  BUN 11  --  12  CREATININE 0.88  --  0.74  CALCIUM 8.7*  --  9.1  MG  --  1.6*  --    Liver Function Tests:  Recent Labs Lab 06/23/15 1457 06/24/15 0601  AST 15 19  ALT 12* 13*  ALKPHOS 54 54  BILITOT 0.3 0.3  PROT 6.7 6.7  ALBUMIN 3.4* 3.3*   No results for input(s): LIPASE, AMYLASE in the last 168 hours. No results for input(s): AMMONIA in the last 168 hours. CBC:  Recent Labs Lab 06/23/15 1335 06/24/15 0601  WBC 7.6 6.2  NEUTROABS 4.5  --   HGB 9.6* 9.4*  HCT 31.2* 30.5*  MCV 87.2 87.9  PLT 277 285  Cardiac Enzymes:  Recent Labs Lab 06/23/15 1457  TROPONINI <0.03   BNP (last 3 results) No results for input(s): BNP in the last 8760 hours.  ProBNP (last 3 results) No results for input(s): PROBNP in the last 8760 hours.  CBG: No results for input(s): GLUCAP in the last 168 hours.  No results found for this or any previous visit (from the past 240 hour(s)).   Studies: Dg Chest 2 View  06/23/2015  CLINICAL DATA:  Shortness of  breath, COPD EXAM: CHEST  2 VIEW COMPARISON:  01/17/2015 FINDINGS: Study is limited by poor inspiration. Right IJ Port-A-Cath in place. Central mild vascular congestion without pulmonary edema. Mild basilar atelectasis. No segmental infiltrate. IMPRESSION: Limited study by poor inspiration. Right IJ Port-A-Cath in place. Mild basilar atelectasis. No convincing pulmonary edema. Electronically Signed   By: Lahoma Crocker M.D.   On: 06/23/2015 14:30   Ct Head Wo Contrast  06/23/2015  CLINICAL DATA:  Lethargy, headache EXAM: CT HEAD WITHOUT CONTRAST TECHNIQUE: Contiguous axial images were obtained from the base of the skull through the vertex without intravenous contrast. COMPARISON:  04/01/2013 FINDINGS: This is vasogenic edema in right temporal lobe and right frontal lobe. There is about 7 mm right to left midline shift. Cystic lesion in right frontal lobe measures 3.8 cm. Cystic lesion in right temporal lobe measures 4.6 cm. Second cystic lesion in right frontal lobe measures 2.1 cm. High density lesion in left parietal lobe measures 1 cm. There is a lesion in the region of left caudate measures 1 cm. Findings highly suspicious for metastatic disease. No intracranial hemorrhage. IMPRESSION: Multiple brain lesion as described above highly suspicious for metastatic disease. No intracranial hemorrhage. There is about 7 mm right to left midline shift. These results were called by telephone at the time of interpretation on 06/23/2015 at 2:38 pm to Dr. Jola Schmidt , who verbally acknowledged these results. Electronically Signed   By: Lahoma Crocker M.D.   On: 06/23/2015 14:39   Mr Jeri Cos IR Contrast  06/23/2015  CLINICAL DATA:  Follow-up brain lesions. Lethargy and headache. History of ovarian cancer. EXAM: MRI HEAD WITHOUT AND WITH CONTRAST TECHNIQUE: Multiplanar, multiecho pulse sequences of the brain and surrounding structures were obtained without and with intravenous contrast. CONTRAST:  20 cc MultiHance COMPARISON:   CT head June 23, 2015 FINDINGS: Motion degraded examination, multiple sequences are moderately or severely motion degraded. Multiple intermediate to low FLAIR T2 hyperintense masses, generally bright T2 signal throughout the supra and infratentorial brain. Due to motion, examination may under detect small lesions and precise measurements unable to be obtained. All lesions show peripheral or solid enhancement, larger lesions predominately cystic, smaller lesions predominately solid. All lesions show T2 bright surrounding vasogenic edema. Lesions include: 10 x 13 mm RIGHT cerebellar and, 12 x 12 mm RIGHT mesial temporal lobe, 35 x 48 mm RIGHT temporal lobe, 12 x 18 mm mesial LEFT inferior frontal lobe, 18 x 21 mm RIGHT frontal lobe/insula. 13 x 14 mm LEFT corona radiata, 27 x 42 mm RIGHT frontal lobe convexity, 9 x 10 mm RIGHT mesial parietal lobe. 7 mm RIGHT to LEFT midline shift with partial RIGHT lateral ventricle effacement, no definite ventricular entrapment. No susceptibility artifact to suggest hemorrhagic metastasis. No abnormal extra-axial fluid collection, suspicious extra-axial enhancement nor extra-axial masses. Normal major intracranial vascular flow voids seen at the skull base. Paranasal sinus mucosal thickening without air-fluid levels. The mastoid air cells are well aerated. Due to motion, limited assessment for calvarial  metastasis though none is grossly evident. No abnormal sellar expansion. No cerebellar tonsillar ectopia. Patient is edentulous. IMPRESSION: Multiple sequences are moderately or severely motion degraded, decreasing sensitivity for intracranial metastasis. At least 1 infratentorial and 7 supratentorial enhancing metastasis, largest in RIGHT temporal lobe measuring 35 x 48 mm. 7 mm RIGHT to LEFT midline shift without ventricular entrapment. Electronically Signed   By: Elon Alas M.D.   On: 06/23/2015 21:57    Scheduled Meds: . ALPRAZolam  0.5 mg Oral TID  . antiseptic  oral rinse  7 mL Mouth Rinse q12n4p  . buPROPion  150 mg Oral BID  . chlorhexidine  15 mL Mouth Rinse BID  . citalopram  40 mg Oral q morning - 10a  . dexamethasone  10 mg Intravenous 4 times per day  . enoxaparin (LOVENOX) injection  50 mg Subcutaneous Q24H  . furosemide  40 mg Oral Daily  . ipratropium-albuterol  3 mL Nebulization TID  . levothyroxine  175 mcg Oral QAC breakfast  . mometasone-formoterol  2 puff Inhalation BID  . montelukast  10 mg Oral QHS  . morphine  30 mg Oral BID  . pantoprazole  40 mg Oral BID AC   Continuous Infusions: . sodium chloride 75 mL/hr at 06/23/15 2033    Principal Problem:   Brain metastasis ([facility]) Active Problems:   Anxiety   Depression   Thyroid disease   GERD (gastroesophageal reflux disease)   Normocytic anemia   COPD (chronic obstructive pulmonary disease) ([facility])   Anemia   Ovarian cancer, bilateral ([facility])   Chronic pain due to injury   Asthma, moderate persistent   Obesity, morbid, BMI 40.0-49.9 (H[facility])   On home oxygen therapy   Acute encephalopathy   Ataxia   Chronic respiratory failure ([place]): 3L [place]   Metastasis to brain [facility])   Encounter for palliative care    Time spent: 7 minutes    THOMPSON,DANIEL M.D. [facility] Pager [phone]. If 7PM-7AM, please contact night-coverage at [email or web address], password [facility] 06/24/2015, 11:09 AM  LOS: 1 day

## 2015-06-24 NOTE — Progress Notes (Signed)
Patient found on the bathroom floor, daughter in bathroom with patient. Daughter claimed patient fell when she tried to get up from toilet seat. Patient claimed her legs and ankles felt weak. Patient c/o pain in her left side of body, some redness and scraped skin noted. Patient able to get up assisted with 4 nursing staff.Able to walk back to bed with assistance.Mother was also in the room. Call bell was not on . DR Grandville Silos made aware about incident.Patient to be moved to 1334, closer to nurses station.

## 2015-06-24 NOTE — Consult Note (Signed)
Consultation Note Date: 06/24/2015   Patient Name: Debra [name]  DOB: October 29, 1970  MRN: [id number]  Age / Sex: 44 y.o., female  PCP: Guadlupe Spanish, MD Referring Physician: Eugenie Filler, MD  Reason for Consultation: Establishing goals of care Life limiting illness: Ovarian cancer, possible brain metastatic disease newly diagnosed.   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 Shunt was initially being treated at [facility] and has subsequently  transferred her oncology care to [facility]. Over the course of the past 12 months the patient has had hospitalizations for respiratory failure, pelvic abscess CT scan on 05/19/2015 showed progressive disease and plans were to attempt single agent carboplatin therapy after full dental extractions. However the patient was admitted to the emergency department on 06-23-15 with chief complaint of dizziness falls nausea and imaging showing possible multiple brain metastatic disease.  Palliative care has been consultative for goals of care discussions. Chart reviewed in detail. Met with the patient, patient's mother, patient's daughter at the bedside. The patient is frequently anxious and also tearful. She states she is scared. Discussed extensively with the patient and her mother and her daughter about current active medical conditions. Offered support and active listening. CODE STATUS discussions undertaken. Symptom management discussions undertaken. Patient wishes to undergo possibly whole brain radiation. He states she wants to be alive for the sake of her daughter. Patient's mother is tearful, patient's father died from complications of Parkinson's disease less than a year ago.  He was enrolled in hospice care.  Augment the patient's anxiety regimen. Continue current patient's pain management regimen. Palliative care will continue to follow along to help guide decision-making and to provide supportive care. discussed with Dr. Grandville Silos. Thank you for the consult.    Contacts/Participants in Discussion: Patient herself, patient's mother, patient's daughter Primary Decision Maker: Patient, then her mother Relationship to Patient mother HCPOA: no  Chaplain consult for completion of advance directives in this hospitalization  SUMMARY OF RECOMMENDATIONS: Extensive discussions regarding the patient's current diagnoses, possible brain metastatic disease undertaken. Full code for now Possible radiation oncology consultation for whole brain radiation Management of anxiety Continue other when necessary medications  Thank you for the consult.  Code Status/Advance Care Planning: Full code    Code Status Orders        Start     Ordered   06/23/15 1658  Full code   Continuous     06/23/15 1704      Other Directives:Advanced Directive: Consultation with spiritual care pending for completion of medical power of attorney.  Symptom Management:   Anxiety: Increase dose of Xanax  Continue bowel regimen  Continue current pain management regimen: Patient feels her pain is adequately well controlled. She is on scheduled MS Contin 30 mg by mouth twice a day. She is on when necessary Percocet for breakthrough pain.  Palliative Prophylaxis:   Bowel Regimen  Additional Recommendations (Limitations, Scope, Preferences):  Continue to address and outline goals of care based on this hospitalization course. Extensive discussions about the nature of the patient's illness particularly in light of possible brain metastatic disease discussed.  Psycho-social/Spiritual:  Support System: Strong Desire for further Chaplaincy support:no Additional Recommendations: Caregiving   Support/Resources  Prognosis: Unable to determine  Discharge Planning: Pending further hospitalization course, possibly home under the care of her mother   Chief Complaint/ Primary  Diagnoses: Present on Admission:  . Brain metastasis ([place]) . Acute encephalopathy . Anxiety . Depression . Thyroid disease . GERD (gastroesophageal reflux disease) . Normocytic anemia . COPD (chronic obstructive pulmonary disease) ([place]) . Ovarian cancer, bilateral ([place]) . Chronic pain due to injury . Asthma, moderate persistent . Obesity, morbid, BMI 40.0-49.9 ([place]) . Chronic respiratory failure ([place]): 3L [place] . Anemia . Metastasis to brain [facility])  I have reviewed the medical record, interviewed the patient and family, and examined the patient. The following aspects are pertinent.  Past Medical History  Diagnosis Date  . Asthma   . Ulcerative colitis   . Anxiety   . Depression   . Chronic back pain   . Chronic knee pain   . Thyroid disease     hypo  . GERD (gastroesophageal reflux disease)   . Insomnia   . Allergic rhinitis   . Arthritis   . COPD (chronic obstructive pulmonary disease) ([place])   . Mouth pain     jaw and mouth pain 06-13-15  . Cancer [facility])     Ovarian cancer 10'15- Dr. Curlene Dolphin follows. Chemo last 1'16, additional planned .  Marland Kitchen Chronic respiratory failure ([place]): 3L [place] 06/23/2015   Social History   Social History  . Marital Status: Divorced    Spouse Name: N/A  . Number of Children: 1  . Years of Education: N/A   Social History Main Topics  . Smoking status: Former Smoker -- 1.00 packs/day for 3 years    Types: Cigarettes    Quit date: 10/19/2011  . Smokeless tobacco: Never Used  . Alcohol Use: No     Comment: rarely   . Drug Use: No  . Sexual Activity: No   Other Topics Concern  . None   Social History Narrative   Lives with her mom currently, but she usually rents an apartment with her daughter.  Occasionally uses a cane.        PCP:  Lovenia Shuck             Family History  Problem Relation Age of Onset  . High blood pressure Mother   . Bipolar disorder Mother   . Hypothyroidism Mother   . Rheum arthritis Mother   . Breast cancer Mother   . Ovarian cancer Mother   . Asthma Father   . Parkinson's disease Father   . CAD Father    Scheduled Meds: . ALPRAZolam  0.5 mg Oral TID  . antiseptic oral rinse  7 mL Mouth Rinse q12n4p  . buPROPion  150 mg Oral BID  . chlorhexidine  15 mL Mouth Rinse BID  . citalopram  40 mg Oral q morning - 10a  . dexamethasone  10 mg Intravenous 4 times per day  . enoxaparin (LOVENOX) injection  50 mg Subcutaneous Q24H  . furosemide  40 mg Oral Daily  . ipratropium-albuterol  3 mL Nebulization TID  . levothyroxine  175 mcg Oral QAC breakfast  . mometasone-formoterol  2 puff Inhalation BID  . montelukast  10 mg Oral QHS  . morphine  30 mg Oral BID  . pantoprazole  40 mg Oral BID AC   Continuous Infusions: . sodium chloride 75 mL/hr at 06/23/15 2033   PRN Meds:.acetaminophen **OR** acetaminophen, benzonatate, dextromethorphan-guaiFENesin, gi cocktail, ipratropium-albuterol, ketorolac, oxyCODONE-acetaminophen, prochlorperazine, promethazine, senna-docusate, sodium chloride, sorbitol, traZODone Medications Prior to Admission:  Prior to Admission medications   Medication Sig Start Date End Date Taking? Authorizing Provider  albuterol (PROAIR HFA) 108 (90 BASE) MCG/ACT  inhaler Inhale 2 puffs into the lungs every 4 (four) hours as needed for wheezing or shortness of breath. 02/28/14  Yes Shanker Kristeen Mans, MD  ALPRAZolam Duanne Moron) 0.5 MG tablet Take 0.5 mg by mouth 2 (two) times daily.   Yes Historical Provider, MD  benzonatate (TESSALON) 100 MG capsule Take 100 mg by mouth 3 (three) times daily as needed for cough.  01/03/15  Yes Historical Provider, MD  buPROPion (WELLBUTRIN SR) 150 MG 12 hr tablet Take 150 mg by mouth 2 (two) times daily. 06/21/15  Yes Historical Provider, MD  citalopram (CELEXA) 40 MG tablet Take  40 mg by mouth every morning.    Yes Historical Provider, MD  Dextromethorphan-Guaifenesin (MUCINEX DM) 30-600 MG TB12 Take 2 tablets by mouth every 6 (six) hours as needed (for cough/congestion).   Yes Historical Provider, MD  Fluticasone-Salmeterol (ADVAIR) 500-50 MCG/DOSE AEPB Inhale 2 puffs into the lungs 2 (two) times daily.    Yes Historical Provider, MD  furosemide (LASIX) 40 MG tablet Take 40 mg by mouth daily.   Yes Historical Provider, MD  ipratropium-albuterol (DUONEB) 0.5-2.5 (3) MG/3ML SOLN Take 3 mLs by nebulization every 6 (six) hours as needed (for wheezing. Take three times daily for  next 3 days). 02/28/14  Yes Shanker Kristeen Mans, MD  levothyroxine (SYNTHROID, LEVOTHROID) 175 MCG tablet Take 175 mcg by mouth daily before breakfast.   Yes Historical Provider, MD  lidocaine-prilocaine (EMLA) cream Apply 1 application topically daily as needed. 07/07/14  Yes Historical Provider, MD  montelukast (SINGULAIR) 10 MG tablet Take 10 mg by mouth at bedtime.    Yes Historical Provider, MD  morphine (MS CONTIN) 30 MG 12 hr tablet Take 30 mg by mouth 2 (two) times daily. 06/17/15  Yes Historical Provider, MD  morphine (MSIR) 15 MG tablet Take 15 mg by mouth 2 (two) times daily. 06/17/15  Yes Historical Provider, MD  omeprazole (PRILOSEC) 40 MG capsule Take 40 mg by mouth 2 (two) times daily.    Yes Historical Provider, MD  oxyCODONE-acetaminophen (PERCOCET) 5-325 MG tablet Take one or two tablets by mouth every 4-6 hours as needed for pain. 06/14/15  Yes Lenn Cal, DDS  OXYGEN Inhale 3 L/min into the lungs every evening.    Yes Historical Provider, MD  predniSONE (DELTASONE) 10 MG tablet Take 10 mg by mouth daily with breakfast.   Yes Historical Provider, MD  prochlorperazine (COMPAZINE) 10 MG tablet Take 1 tablet (10 mg total) by mouth every 6 (six) hours as needed for nausea or vomiting. 05/27/15  Yes Lennis Marion Downer, MD  promethazine (PHENERGAN) 25 MG tablet Take 1 tablet (25 mg total)  by mouth every 6 (six) hours as needed for nausea or vomiting. 05/27/15  Yes Lennis Marion Downer, MD  Tiotropium Bromide Monohydrate (SPIRIVA RESPIMAT) 2.5 MCG/ACT AERS Inhale 2 puffs into the lungs 2 (two) times daily.   Yes Historical Provider, MD  traZODone (DESYREL) 100 MG tablet Take 100 mg by mouth at bedtime as needed for sleep.    Yes Historical Provider, MD  levofloxacin (LEVAQUIN) 500 MG tablet Take 500 mg by mouth daily.    Historical Provider, MD   Allergies  Allergen Reactions  . Zofran [Ondansetron Hcl]     Headache  . Penicillins Swelling and Rash    Has patient had a PCN reaction causing immediate rash, facial/tongue/throat swelling, SOB or lightheadedness with hypotension: yes Has patient had a PCN reaction causing severe rash involving mucus membranes or skin necrosis: unknown Has patient  had a PCN reaction that required hospitalization yes Has patient had a PCN reaction occurring within the last 10 years: yes If all of the above answers are "NO", then may proceed with Cephalosporin use.   . Sulfamethoxazole-Trimethoprim Nausea Only   CBC:    Component Value Date/Time   WBC 6.2 06/24/2015 0601   WBC 8.1 05/19/2015 0921   HGB 9.4* 06/24/2015 0601   HGB 11.5* 05/19/2015 0921   HCT 30.5* 06/24/2015 0601   HCT 37.8 05/19/2015 0921   PLT 285 06/24/2015 0601   PLT 240 05/19/2015 0921   MCV 87.9 06/24/2015 0601   MCV 84.8 05/19/2015 0921   NEUTROABS 4.5 06/23/2015 1335   NEUTROABS 6.5 05/19/2015 0921   LYMPHSABS 1.8 06/23/2015 1335   LYMPHSABS 1.0 05/19/2015 0921   MONOABS 1.0 06/23/2015 1335   MONOABS 0.3 05/19/2015 0921   EOSABS 0.3 06/23/2015 1335   EOSABS 0.3 05/19/2015 0921   BASOSABS 0.0 06/23/2015 1335   BASOSABS 0.0 05/19/2015 0921   Comprehensive Metabolic Panel:    Component Value Date/Time   NA 137 06/24/2015 0601   NA 138 05/19/2015 0921   K 4.0 06/24/2015 0601   K 3.9 05/19/2015 0921   CL 100* 06/24/2015 0601   CO2 27 06/24/2015 0601   CO2 25  05/19/2015 0921   BUN 12 06/24/2015 0601   BUN 6.6* 05/19/2015 0921   CREATININE 0.74 06/24/2015 0601   CREATININE 1.1 05/19/2015 0921   GLUCOSE 191* 06/24/2015 0601   GLUCOSE 91 05/19/2015 0921   CALCIUM 9.1 06/24/2015 0601   CALCIUM 9.1 05/19/2015 0921   AST 19 06/24/2015 0601   AST 9 05/19/2015 0921   ALT 13* 06/24/2015 0601   ALT 9 05/19/2015 0921   ALKPHOS 54 06/24/2015 0601   ALKPHOS 64 05/19/2015 0921   BILITOT 0.3 06/24/2015 0601   BILITOT 1.10 05/19/2015 0921   PROT 6.7 06/24/2015 0601   PROT 7.4 05/19/2015 0921   ALBUMIN 3.3* 06/24/2015 0601   ALBUMIN 3.8 05/19/2015 0921    Review of Systems Positive for anxiety Physical Exam Awake alert resting, sitting by the edge of the bed Visibly anxious Clear to auscultation S1-S2 Abdomen soft distended Extremities trace edema Nonfocal Vital Signs: BP 110/90 mmHg  Pulse 90  Temp(Src) 97.9 F (36.6 C) (Oral)  Resp 14  Ht 5' 2"  (1.575 m)  Wt 102.785 kg (226 lb 9.6 oz)  BMI 41.44 kg/m2  SpO2 98% SpO2: Last BM Date: 06/24/15  O2 Device:SpO2: 98 % O2 Flow Rate: .O2 Flow Rate (L/min): 2 L/min Intake/output summary:  Intake/Output Summary (Last 24 hours) at 06/24/15 1054 Last data filed at 06/24/15 0838  Gross per 24 hour  Intake   2240 ml  Output   4200 ml  Net  -1960 ml   LBM:  BMP Latest Ref Rng 06/24/2015 06/23/2015 06/13/2015  Glucose 65 - 99 mg/dL 191(H) 82 88  BUN 6 - 20 mg/dL 12 11 7   Creatinine 0.44 - 1.00 mg/dL 0.74 0.88 0.89  Sodium 135 - 145 mmol/L 137 134(L) 137  Potassium 3.5 - 5.1 mmol/L 4.0 3.8 3.9  Chloride 101 - 111 mmol/L 100(L) 98(L) 103  CO2 22 - 32 mmol/L 27 27 25   Calcium 8.9 - 10.3 mg/dL 9.1 8.7(L) 9.3    Baseline Weight: Weight: 102.059 kg (225 lb) Most recent weight: Weight: 102.785 kg (226 lb 9.6 oz)      Palliative Assessment/Data:  Flowsheet Rows        Most Recent Value  Intake Tab    Referral Department  Hospitalist   Unit at Time of Referral  Med/Surg Unit   Palliative  Care Primary Diagnosis  Cancer   Date Notified  06/24/15   Palliative Care Type  New Palliative care   Reason for referral  Clarify Goals of Care   Date of Admission  06/23/15   Date first seen by Palliative Care  06/24/15   # of days Palliative referral response time  0 Day(s)   # of days IP prior to Palliative referral  1   Clinical Assessment    Palliative Performance Scale Score  40%   Pain Max last 24 hours  2   Pain Min Last 24 hours  1   Dyspnea Max Last 24 Hours  2   Dyspnea Min Last 24 hours  1   Nausea Max Last 24 Hours  3   Nausea Min Last 24 Hours  2   Anxiety Max Last 24 Hours  6   Anxiety Min Last 24 Hours  5   Other Max Last 24 Hours  0   Psychosocial & Spiritual Assessment    Social Work Plan of Care  Clarified patient/family wishes with healthcare team   Palliative Care Outcomes    Patient/Family meeting held?  Yes   Who was at the meeting?  Patient, mother, daughter   Palliative Care Outcomes  Clarified goals of care   Patient/Family wishes: Interventions discontinued/not started   Other (Comment)   Palliative Care follow-up planned  Yes, Facility      Additional Data Reviewed: Recent Labs     06/23/15  1335  06/23/15  1457  06/24/15  0601  WBC  7.6   --   6.2  HGB  9.6*   --   9.4*  PLT  277   --   285  NA   --   134*  137  BUN   --   11  12  CREATININE   --   0.88  0.74    Time In: 0 900 Time Out: 1000 Time Total: 60 Greater than 50%  of this time was spent counseling and coordinating care related to the above assessment and plan.  Signed by: Loistine Chance, MD [phone] Loistine Chance, MD  06/24/2015, 10:54 AM  Please contact Palliative Medicine Team phone at [phone] for questions and concerns.

## 2015-06-24 NOTE — Progress Notes (Signed)
Radiation Oncology         [phone] ________________________________  Initial inpatient Consultation  Name: Debra [name] MRN: [id number]  Date: 06/23/2015  DOB: 06/12/1971  WE:XHBZJI,RCVELFY M, MD  No ref. provider found   REFERRING PHYSICIAN: No ref. provider found  DIAGNOSIS: The primary encounter diagnosis was Ovarian cancer, bilateral ([place]). Diagnoses of Brain metastasis ([place]), Ataxia, Abnormal CT of brain, and Swelling were also pertinent to this visit.   44 y.o. 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    ICD-9-CM ICD-10-CM   1. Ovarian cancer, bilateral (HCC) 183.0 C56.1     C56.2   2. Brain metastasis (HCC) 198.3 C79.31   3. Ataxia 781.3 R27.0   4. Abnormal CT of brain 793.0 R93.0 MR Brain W Wo Contrast     MR Brain W Wo Contrast  5. Swelling 782.3 R60.9 VAS [place] LOWER EXTREMITY VENOUS (DVT)     VAS [place] LOWER EXTREMITY VENOUS (DVT)    HISTORY OF PRESENT ILLNESS::Deniesha L [name] is a 44 y.o. 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 Subsequent brain MRI on 06/23/15 demonstrated 8 brain metastases with the largest measuring 4.8 cm in the right temporal lobe.   Significant comorbid conditions include chronic pulmonary disease with recurrent pneumonias and frequent treatment with antibiotics and steroids, and chronic pain since tractor accident in childhood, on MS Contin 30 mg bid with other pain meds, by PCP. Mother assists with pain medication. She is followed closely by Dr [name] of pulmonary and PCP Dr Holley Raring, whom she saw last week.   PREVIOUS RADIATION THERAPY: No  PAST MEDICAL HISTORY:  has a past medical history of Asthma; Ulcerative colitis; Anxiety; Depression; Chronic back pain; Chronic knee pain; Thyroid disease; GERD  (gastroesophageal reflux disease); Insomnia; Allergic rhinitis; Arthritis; COPD (chronic obstructive pulmonary disease) ([place]); Mouth pain; Cancer [facility]); and Chronic respiratory failure ([place]): 3L [place] (06/23/2015).    PAST SURGICAL HISTORY: Past Surgical History  Procedure Laterality Date  . Knee surgery      x3, two on left and one on right.  Arthoscopy  . Cholecystectomy    . Abdominal hysterectomy    . Cesarean section    . Tonsillectomy    . Joint replacement      2x on left, 1x on right  . Portacath placement Right     06-13-15 remains inplace  . Multiple extractions with alveoloplasty N/A 06/14/2015    Procedure: Extraction of tooth #'s 2, 4-14, 20-29 with alveoloplasty;  Surgeon: Lenn Cal, DDS;  Location: [facility] ORS;  Service: Oral Surgery;  Laterality: N/A;    FAMILY HISTORY: family history includes Asthma in her father; Bipolar disorder in her mother; Breast cancer in her mother; CAD in her father; High blood pressure in her mother; Hypothyroidism in her mother; Ovarian cancer in her mother; Parkinson's disease in her father; Rheum arthritis in her mother.  SOCIAL HISTORY:  Social History   Social History  . Marital Status: Divorced    Spouse Name: N/A  . Number of Children: 1  . Years of Education: N/A   Occupational History  . Not on file.   Social History Main Topics  . Smoking status: Former Smoker -- 1.00 packs/day for 3 years    Types: Cigarettes  Quit date: 10/19/2011  . Smokeless tobacco: Never Used  . Alcohol Use: No     Comment: rarely   . Drug Use: No  . Sexual Activity: No   Other Topics Concern  . Not on file   Social History Narrative   Lives with her mom currently, but she usually rents an apartment with her daughter.  Occasionally uses a cane.        PCP:  Lovenia Shuck             ALLERGIES: Zofran; Penicillins; and Sulfamethoxazole-trimethoprim  MEDICATIONS:  Current Facility-Administered Medications  Medication Dose Route  Frequency Provider Last Rate Last Dose  . 0.9 %  sodium chloride infusion   Intravenous Continuous Eugenie Filler, MD 75 mL/hr at 06/24/15 1141    . acetaminophen (TYLENOL) tablet 650 mg  650 mg Oral Q6H PRN Eugenie Filler, MD       Or  . acetaminophen (TYLENOL) suppository 650 mg  650 mg Rectal Q6H PRN Eugenie Filler, MD      . ALPRAZolam Duanne Moron) tablet 0.5 mg  0.5 mg Oral TID Loistine Chance, MD      . antiseptic oral rinse (CPC / CETYLPYRIDINIUM CHLORIDE 0.05%) solution 7 mL  7 mL Mouth Rinse q12n4p Eugenie Filler, MD   7 mL at 06/23/15 1800  . benzonatate (TESSALON) capsule 200 mg  200 mg Oral TID PRN Eugenie Filler, MD   200 mg at 06/23/15 2214  . buPROPion (WELLBUTRIN SR) 12 hr tablet 150 mg  150 mg Oral BID Eugenie Filler, MD   150 mg at 06/24/15 0945  . chlorhexidine (PERIDEX) 0.12 % solution 15 mL  15 mL Mouth Rinse BID Eugenie Filler, MD   15 mL at 06/24/15 0946  . citalopram (CELEXA) tablet 40 mg  40 mg Oral q morning - 10a Eugenie Filler, MD   40 mg at 06/24/15 0946  . dexamethasone (DECADRON) injection 10 mg  10 mg Intravenous 4 times per day Eugenie Filler, MD   10 mg at 06/24/15 1141  . dextromethorphan-guaiFENesin (MUCINEX DM) 30-600 MG per 12 hr tablet 2 tablet  2 tablet Oral Q12H PRN Eugenie Filler, MD      . enoxaparin (LOVENOX) injection 50 mg  50 mg Subcutaneous Q24H Audrea Muscat [place], RPH   50 mg at 06/23/15 2034  . furosemide (LASIX) tablet 40 mg  40 mg Oral Daily Eugenie Filler, MD   40 mg at 06/24/15 0945  . gi cocktail (Maalox,Lidocaine,Donnatal)  30 mL Oral TID PRN Eugenie Filler, MD      . ipratropium-albuterol (DUONEB) 0.5-2.5 (3) MG/3ML nebulizer solution 3 mL  3 mL Nebulization Q2H PRN Eugenie Filler, MD      . ipratropium-albuterol (DUONEB) 0.5-2.5 (3) MG/3ML nebulizer solution 3 mL  3 mL Nebulization TID Eugenie Filler, MD   3 mL at 06/24/15 1050  . ketorolac (TORADOL) 30 MG/ML injection 30 mg  30 mg Intravenous Q6H PRN Eugenie Filler, MD   30 mg at 06/24/15 1141  . levothyroxine (SYNTHROID, LEVOTHROID) tablet 175 mcg  175 mcg Oral QAC breakfast Eugenie Filler, MD   175 mcg at 06/24/15 [phone]  . mometasone-formoterol (DULERA) 200-5 MCG/ACT inhaler 2 puff  2 puff Inhalation BID Eugenie Filler, MD   2 puff at 06/24/15 1050  . montelukast (SINGULAIR) tablet 10 mg  10 mg Oral QHS Eugenie Filler, MD      .  morphine (MS CONTIN) 12 hr tablet 30 mg  30 mg Oral BID Eugenie Filler, MD   30 mg at 06/24/15 0946  . oxyCODONE-acetaminophen (PERCOCET/ROXICET) 5-325 MG per tablet 1-2 tablet  1-2 tablet Oral Q4H PRN Eugenie Filler, MD   2 tablet at 06/24/15 1410  . pantoprazole (PROTONIX) EC tablet 40 mg  40 mg Oral BID AC Eugenie Filler, MD   40 mg at 06/24/15 [phone]  . prochlorperazine (COMPAZINE) injection 10 mg  10 mg Intravenous Q6H PRN Eugenie Filler, MD      . promethazine (PHENERGAN) tablet 25 mg  25 mg Oral Q6H PRN Eugenie Filler, MD   25 mg at 06/24/15 0853  . senna-docusate (Senokot-S) tablet 1 tablet  1 tablet Oral QHS PRN Irine Seal V, MD      . sodium chloride 0.9 % injection 10-40 mL  10-40 mL Intracatheter PRN Irine Seal V, MD      . sodium chloride irrigation 0.9 % 200 mL  200 mL Irrigation Continuous Lenn Cal, DDS      . sorbitol 70 % solution 30 mL  30 mL Oral Daily PRN Eugenie Filler, MD      . traZODone (DESYREL) tablet 100 mg  100 mg Oral QHS PRN Eugenie Filler, MD   100 mg at 06/23/15 2212    REVIEW OF SYSTEMS:  A 15 point review of systems is documented in the electronic medical record. This was obtained by the nursing staff. However, I reviewed this with the patient to discuss relevant findings and make appropriate changes.  Pertinent items are noted in HPI.   The patient is ambulatory, however she has had some recent episodes of falling. She has also had some episodes of altered status according to her mother.    PHYSICAL EXAM:  height is 5\' 2"  (1.575 m) and  weight is 226 lb 9.6 oz (102.785 kg). Her oral temperature is 96.8 F (36 C). Her blood pressure is 129/72 and her pulse is 96. Her respiration is 16 and oxygen saturation is 98%.    Per Dr. Grandville Silos:   General: Well-developed well-nourished laying on gurney in no acute cardiopulmonary distress. Somewhat drowsy. Speaking in full sentences.   Eyes: PERRLA, EOMI, normal lids, irises & conjunctiva  ENT: grossly normal hearing, lips & tongue. Dry mucous membranes.  Neck: no LAD, masses or thyromegaly  Cardiovascular: RRR, no m/r/g. Chronic left lower extremity lymphedema with TED hose.  Respiratory: CTA bilaterally, no w/r/r. Normal respiratory effort.  Abdomen: soft, ntnd, positive bowel sounds, no rebound, no guarding  Skin: no rash or induration seen on limited exam  Musculoskeletal: grossly normal tone BUE/BLE  Psychiatric: grossly normal mood and affect, speech fluent and appropriate  Neurologic: Alert. Cranial nerves II through XII grossly intact. Visual fields intact. Sensation is intact. Unable to elicit reflexes symmetrically and diffusely. Gait not tested secondary to safety.   Per my inspection today, the pts speech is mildly dysarthric. She is alert and oriented. The patient appears significantly dibilitated.   KPS = 40  100 - Normal; no complaints; no evidence of disease. 90   - Able to carry on normal activity; minor signs or symptoms of disease. 80   - Normal activity with effort; some signs or symptoms of disease. 36   - Cares for self; unable to carry on normal activity or to do active work. 60   - Requires occasional assistance, but is able to care for most of  his personal needs. 50   - Requires considerable assistance and frequent medical care. 71   - Disabled; requires special care and assistance. 39   - Severely disabled; hospital admission is indicated although death not imminent. 69   - Very sick; hospital admission necessary; active supportive treatment  necessary. 10   - Moribund; fatal processes progressing rapidly. 0     - Dead  Karnofsky DA, Abelmann Experiment, Craver LS and Burchenal JH [phone]) The use of the nitrogen mustards in the palliative treatment of carcinoma: with particular reference to bronchogenic carcinoma Cancer 1 634-56  LABORATORY DATA:  Lab Results  Component Value Date   WBC 6.2 06/24/2015   HGB 9.4* 06/24/2015   HCT 30.5* 06/24/2015   MCV 87.9 06/24/2015   PLT 285 06/24/2015   Lab Results  Component Value Date   NA 137 06/24/2015   K 4.0 06/24/2015   CL 100* 06/24/2015   CO2 27 06/24/2015   Lab Results  Component Value Date   ALT 13* 06/24/2015   AST 19 06/24/2015   ALKPHOS 54 06/24/2015   BILITOT 0.3 06/24/2015     RADIOGRAPHY: Dg Chest 2 View  06/23/2015  CLINICAL DATA:  Shortness of breath, COPD EXAM: CHEST  2 VIEW COMPARISON:  01/17/2015 FINDINGS: Study is limited by poor inspiration. Right IJ Port-A-Cath in place. Central mild vascular congestion without pulmonary edema. Mild basilar atelectasis. No segmental infiltrate. IMPRESSION: Limited study by poor inspiration. Right IJ Port-A-Cath in place. Mild basilar atelectasis. No convincing pulmonary edema. Electronically Signed   By: Lahoma Crocker M.D.   On: 06/23/2015 14:30   Ct Head Wo Contrast  06/23/2015  CLINICAL DATA:  Lethargy, headache EXAM: CT HEAD WITHOUT CONTRAST TECHNIQUE: Contiguous axial images were obtained from the base of the skull through the vertex without intravenous contrast. COMPARISON:  04/01/2013 FINDINGS: This is vasogenic edema in right temporal lobe and right frontal lobe. There is about 7 mm right to left midline shift. Cystic lesion in right frontal lobe measures 3.8 cm. Cystic lesion in right temporal lobe measures 4.6 cm. Second cystic lesion in right frontal lobe measures 2.1 cm. High density lesion in left parietal lobe measures 1 cm. There is a lesion in the region of left caudate measures 1 cm. Findings highly suspicious for  metastatic disease. No intracranial hemorrhage. IMPRESSION: Multiple brain lesion as described above highly suspicious for metastatic disease. No intracranial hemorrhage. There is about 7 mm right to left midline shift. These results were called by telephone at the time of interpretation on 06/23/2015 at 2:38 pm to Dr. Jola Schmidt , who verbally acknowledged these results. Electronically Signed   By: Lahoma Crocker M.D.   On: 06/23/2015 14:39   Mr Jeri Cos JS Contrast  06/23/2015  CLINICAL DATA:  Follow-up brain lesions. Lethargy and headache. History of ovarian cancer. EXAM: MRI HEAD WITHOUT AND WITH CONTRAST TECHNIQUE: Multiplanar, multiecho pulse sequences of the brain and surrounding structures were obtained without and with intravenous contrast. CONTRAST:  20 cc MultiHance COMPARISON:  CT head June 23, 2015 FINDINGS: Motion degraded examination, multiple sequences are moderately or severely motion degraded. Multiple intermediate to low FLAIR T2 hyperintense masses, generally bright T2 signal throughout the supra and infratentorial brain. Due to motion, examination may under detect small lesions and precise measurements unable to be obtained. All lesions show peripheral or solid enhancement, larger lesions predominately cystic, smaller lesions predominately solid. All lesions show T2 bright surrounding vasogenic edema. Lesions include: 10 x 13 mm RIGHT  cerebellar and, 12 x 12 mm RIGHT mesial temporal lobe, 35 x 48 mm RIGHT temporal lobe, 12 x 18 mm mesial LEFT inferior frontal lobe, 18 x 21 mm RIGHT frontal lobe/insula. 13 x 14 mm LEFT corona radiata, 27 x 42 mm RIGHT frontal lobe convexity, 9 x 10 mm RIGHT mesial parietal lobe. 7 mm RIGHT to LEFT midline shift with partial RIGHT lateral ventricle effacement, no definite ventricular entrapment. No susceptibility artifact to suggest hemorrhagic metastasis. No abnormal extra-axial fluid collection, suspicious extra-axial enhancement nor extra-axial masses. Normal  major intracranial vascular flow voids seen at the skull base. Paranasal sinus mucosal thickening without air-fluid levels. The mastoid air cells are well aerated. Due to motion, limited assessment for calvarial metastasis though none is grossly evident. No abnormal sellar expansion. No cerebellar tonsillar ectopia. Patient is edentulous. IMPRESSION: Multiple sequences are moderately or severely motion degraded, decreasing sensitivity for intracranial metastasis. At least 1 infratentorial and 7 supratentorial enhancing metastasis, largest in RIGHT temporal lobe measuring 35 x 48 mm. 7 mm RIGHT to LEFT midline shift without ventricular entrapment. Electronically Signed   By: Elon Alas M.D.   On: 06/23/2015 21:57      IMPRESSION: Very pleasant 44 y.o. 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 She has a very poor performance status at this time. It is not clear whether treatment of her brain metastases will improve her performance status or whether this is a more multi factorial condition. She could potentially be eligible for either whole brain radiotherapy or stereotactic radiation. In order to potentially consider stereotactic radiation, the patient would require hospital discharge for 3T-MRI. The patient's mother doubts whether the patient will be eligible for hospital discharge versus skilled care placement. Based on these considerations, I think that whole brain radiation may be the most appropriate intervention. I talked to the patient and her mother about these considerations and the different options available. At this time, we will plan to proceed with whole brain radiation.   PLAN: Today, I talked to the patient and family about the findings and work-up thus far.  We discussed the natural history of multiple large brain metastases with poor performance status and general treatment,  highlighting the role of radiotherapy in the management.  We discussed the available radiation techniques, and focused on the details of logistics and delivery.  We reviewed the anticipated acute and late sequelae associated with radiation in this setting.  The patient was encouraged to ask questions that I answered to the best of my ability.  I filled out a patient counseling form during our discussion including treatment diagrams.  We retained a copy for our records.  The patient would like to proceed with radiation and has been scheduled for CT simulation today. We will potentially start radiation on Monday, 11/7.  I spent 60 minutes minutes face to face with the patient and more than 50% of that time was spent in counseling and/or coordination of care.   ------------------------------------------------  Sheral Apley. Tammi Klippel, M.D.   This document serves as a record of services personally performed by Tyler Pita, MD. It was created on his behalf by Arlyce Harman, a trained medical scribe. The creation of this record is based on the scribe's personal observations and the provider's statements to them. This document has been checked and approved by the attending provider.

## 2015-06-24 NOTE — Progress Notes (Signed)
POST OPERATIVE NOTE:  06/24/2015   Debra [name] [id number]  VITALS: BP 150/85 mmHg  Pulse 94  Temp(Src) 97.8 F (36.6 C) (Oral)  Resp 18  Ht 5\' 2"  (1.575 m)  Wt 226 lb 9.6 oz (102.785 kg)  BMI 41.44 kg/m2  SpO2 99%  LABS:  Lab Results  Component Value Date   WBC 6.2 06/24/2015   HGB 9.4* 06/24/2015   HCT 30.5* 06/24/2015   MCV 87.9 06/24/2015   PLT 285 06/24/2015   BMET    Component Value Date/Time   NA 137 06/24/2015 0601   NA 138 05/19/2015 0921   K 4.0 06/24/2015 0601   K 3.9 05/19/2015 0921   CL 100* 06/24/2015 0601   CO2 27 06/24/2015 0601   CO2 25 05/19/2015 0921   GLUCOSE 191* 06/24/2015 0601   GLUCOSE 91 05/19/2015 0921   BUN 12 06/24/2015 0601   BUN 6.6* 05/19/2015 0921   CREATININE 0.74 06/24/2015 0601   CREATININE 1.1 05/19/2015 0921   CALCIUM 9.1 06/24/2015 0601   CALCIUM 9.1 05/19/2015 0921   GFRNONAA >60 06/24/2015 0601   GFRAA >60 06/24/2015 0601    No results found for: INR, PROTIME No results found for: PTT   Debra [name] is status post extraction of remaining teeth with alveoloplasty in the operating room on 06/14/2015. The patient now presents for evaluation of healing and suture removal as indicated. Patient was recently admitted diagnosed with metastases to her brain with anticipated radiation therapy to start today.   SUBJECTIVE: Patient denies having any acute dental pain. Some stitches may remain.  EXAM: There is no sign of oral infection, heme, or ooze. Several sutures are loosely intact. Patient is healing in by generalized primary closure. The maxillary right and maxillary left posterior portions will need to heal in by secondary intention.  PROCEDURE: The patient was given a chlorhexidine gluconate rinse for 30 seconds. Sutures were then removed without complication. Patient tolerated the procedure well.  ASSESSMENT: Post operative course is consistent with dental procedures performed in the OR. The patient is  edentulous. The patient has severe atrophy of the edentulous alveolar ridges.  PLAN: 1. Use chlorhexidine rinses twice daily after breakfast and at bedtime. 2. Use salt water rinses every 2 hours while awake in between the chlorhexidine rinses 3. Will obtain a prior approval for upper and lower complete dentures from Medicaid. 4. Will see patient in one month and start denture fabrication at that time. Patient is aware of the dentures will not be able to be inserted before early to late January 2017. Patient indicates that she still wants to proceed with denture fabrication by dental medicine. The patient is aware of the overall limited prognosis for successful upper lower complete denture fabrication secondary to severe atrophy of alveolar ridges.   Lenn Cal, DDS

## 2015-06-24 NOTE — Evaluation (Signed)
Physical Therapy Evaluation Patient Details Name: Debra [name] MRN: [id number] DOB: Jan 04, 1971 Today's Date: 06/24/2015   History of Present Illness  44 yo female with metastatic brain cancer, has hx of ovarian cancer, GERD, depression, asthma, chronic pain, respiratory failure, hypothyroidism, and morbid obesety   Clinical Impression  Pt admitted with above diagnosis. Pt currently with functional limitations due to the deficits listed below (see PT Problem List). Pt will benefit from skilled PT to increase their independence and safety with mobility to allow discharge to the venue listed below. Pt with plans to d/c home, has strong family support. Monitored pt's oxygen, maintained >96% on RA throughout therapy session.         Follow Up Recommendations No PT follow up;Supervision for mobility/OOB    Equipment Recommendations  None recommended by PT    Recommendations for Other Services       Precautions / Restrictions Precautions Precautions: Fall Restrictions Weight Bearing Restrictions: No      Mobility  Bed Mobility Overal bed mobility: Needs Assistance Bed Mobility: Supine to Sit     Supine to sit: Min guard (for safety )     General bed mobility comments: contact guard for safety  Transfers Overall transfer level: Needs assistance Equipment used: 1 person hand held assist Transfers: Sit to/from Stand Sit to Stand: Min guard         General transfer comment: assist with lines, contact guard to ensure safe transfer; pt is impulsive  Ambulation/Gait Ambulation/Gait assistance: Min guard Ambulation Distance (Feet): 180 Feet Assistive device: 1 person hand held assist (100 feet hand held assist, 80 feet with IV pole) Gait Pattern/deviations: Step-through pattern;Wide base of support;Decreased stride length     General Gait Details: min guard assist for safety  Stairs            Wheelchair Mobility    Modified Rankin (Stroke Patients Only)        Balance Overall balance assessment: Needs assistance           Standing balance-Leahy Scale: Good                               Pertinent Vitals/Pain Pain Assessment: 0-10 Pain Location: head - pt reports RN managing    Home Living Family/patient expects to be discharged to:: Private residence Living Arrangements: Parent;Children (mother and daughter) Available Help at Discharge: Family Type of Home: House Home Access: Stairs to enter;Ramped entrance (uses ramp)   Entrance Stairs-Number of Steps: 6 Home Layout: One level        Prior Function Level of Independence: Independent;Needs assistance         Comments: 60% of the time on home oxygen 3 L, sleeps with it all night, per pt's mom     Hand Dominance        Extremity/Trunk Assessment   Upper Extremity Assessment: Defer to OT evaluation;Overall WFL for tasks assessed           Lower Extremity Assessment: Overall WFL for tasks assessed (pt reports she  performs basic LE exercises daily)      Cervical / Trunk Assessment: Normal  Communication   Communication: No difficulties  Cognition Arousal/Alertness: Awake/alert Behavior During Therapy: WFL for tasks assessed/performed Overall Cognitive Status: Within Functional Limits for tasks assessed                      General Comments  Exercises General Exercises - Lower Extremity Ankle Circles/Pumps: AROM;Both;10 reps Quad Sets: Strengthening;Both;5 reps Gluteal Sets: Strengthening;Both;5 reps Heel Slides: AROM;Both;10 reps      Assessment/Plan    PT Assessment Patient needs continued PT services  PT Diagnosis Difficulty walking   PT Problem List Decreased activity tolerance;Decreased mobility;Decreased knowledge of precautions;Pain  PT Treatment Interventions DME instruction;Gait training;Functional mobility training;Therapeutic activities;Therapeutic exercise;Patient/family education   PT Goals (Current goals can  be found in the Care Plan section) Acute Rehab PT Goals Patient Stated Goal: to get treatment for cancer and get well PT Goal Formulation: With patient Time For Goal Achievement: 07/15/15 Potential to Achieve Goals: Good    Frequency Min 2X/week   Barriers to discharge        Co-evaluation               End of Session Equipment Utilized During Treatment: Gait belt Activity Tolerance: Patient tolerated treatment well Patient left: in chair;with call bell/phone within reach;with chair alarm set;with family/visitor present Nurse Communication: Mobility status         Time: 1142-1208 PT Time Calculation (min) (ACUTE ONLY): 26 min   Charges:   PT Evaluation $Initial PT Evaluation Tier I: 1 Procedure PT Treatments $Gait Training: 8-22 mins   PT G Codes:        Pranathi Winfree, SPT 18-Jul-2015, 3:05 PM

## 2015-06-24 NOTE — Consult Note (Signed)
Consult was initially requested by Dr. Marko Plume for the evaluation of Debra [name] 44 y.o. 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  CC:  Chief Complaint  Patient presents with  . Ovarian Cancer    Follow up MD appointment    Assessment/Plan:  Debra [name] is a 44 y.o. 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 She recurrence at the central pelvis and peritoneal sites and substantial intracranial mets that are symptomatic with neurologic signs and symptoms and midline shift on imaging. I personally reviewed the CT and MRI films from 06/23/15.   I am recommending acute management of steroids and radiation.  We can then consider chemotherapy. I would recommend an agent that crosses the BBB such as topotecan.  I discussed with Debra [name] that the prognosis from this is poor and it is unlikely that she will survive this disease. If her performance status permits, it is reasonable to trial chemotherapy, however, if her comoribidities prevent adequate dosing, it is also reasonable to consider palliative care/hospice.  HPI: Debra [name] is a 44 year old para swollen woman who was seen in consultation at the request of Dr. 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 The patient's history began in October 2015 at which time she underwent surgery with Dr. Steward Ros in [place]. The surgery included an exploratory laparotomy, BSO and debulking of stage IIIc ovarian cancer. The patient reports that Dr. [name] reported that all macroscopic disease was removed at the time of surgery. She healed initially well from her surgery with the exception of development of a pelvic abscess in November 2015 requiring placement of a drain.  She commenced her first cycle of carboplatin and paclitaxel chemotherapy on 08/05/2014. This was  administered in [place]. She developed respiratory symptoms and possibly pneumonia after her first treatment and beginning her second treatment. Following this time she was hospitalized and intubated, but was not neutropenic. She had acute respiratory failure and possible pneumonia (per patient). Between December and mid January 2016 she received a total of 2 cycles of Paclitaxel chemotherapy. After that point in time the patient declined additional therapy. It is unclear if she had imaging studies performed immediately after completing chemotherapy. She subsequently established care with the pulmonologist Dr. [name].   She saw Dr. Marko Plume in April 2016 for evaluation. As part of her workup she underwent a CT of the abdomen and pelvis on 11/19/2014. This demonstrated a small hypoattenuating nodule along the vaginal cuff measuring 14 x 8 mm. It was described as possibly a complex or hemorrhagic cyst but enhancement related to metastatic deposit cannot be excluded. No other lesions suspicious for metastatic or recurrent disease were appreciated.  She had followup CT of the abdomen and pelvis on 03/02/15 to follow the vaginal cuff nodule. It revealed: No evidence of recurrence in the chest. A stable in size 1.4 x 0.8 cm high attenuation lesion at the vaginal cuff. There were 2 new lesions cephalad to this is deep in the central pelvis one measuring 1.9 x 1.5 x 2.4 cm and the other measuring 1.9 x 1.2 x 1.8 cm. A large ventral hernia continues to be present.  She has persistent left lower extremity lymphedema after placement of a pelvic drain for pelvic abscesses in December 2015.  She has symptoms of intermittent nausea and diarrhea. She has been diagnosed with pneumonia/acute bronchitis by her Pulmonologist, Dr Myrtha Mantis. She is  on prednisone and Levaquin for this. She has severe tooth loss and apparent rotting of teeth.  She had a followup,scheduled CT of the abdomen and pelvis on 05/19/15 which showed:  Interval enlargement of right external iliac lymph node measuring 2.3 x 1.7cm previously 1.1 x 1.1 cm. Unchanged 1.1 x 0.8 cm high attenuation lesion along the margin of the vaginal cuff, potentially representing scar tissue or proteinaceous vaginal wall cyst. Extending along the superior posterior margin of vaginal cuff is an enlarging 2.6 x 1.7 cm rim enhancing soft tissue mass previously 1.9 x 1.5 cm. This appears to be located between the rectum and superior aspect of the vaginal cuff was well as posterior wall of the urinary bladder. This may potentially represent a peritoneal implant or fistulous connection between the vaginal cuff, bladder and rectum. Additionally within the superior left aspect of the hemipelvis there is an enlarging peripherally enhancing centrally low attenuating mass measuring 2.5 x 1.8 cm previously 1.9 x1.2 cm. New 1.7 x 2.2 cm enhancing mass within the pelvis associated with the adjacent colon. Enlarging soft tissue mass measuring 0.9 cm along the inferior aspect of the right psoas muscle, previously 0.4 cm.  CA 125 was elevated at 46 on 05/19/15  She was diagnosed with recurrence with a plan to start salvage chemotherapy in November, after she had received dental work for infected oral carries.  Interval History:  On 06/23/15 she presented to the [facility] ED with altered mental status. A CT of the head revealed multiple brain metastases (bilateral) with edema and midline shift. The largest in the right temporal lobe measured approximately 5cm. She was admitted and given steroids. She is set up for radiation of the brain today. She reports improved neuro symptoms since the steroids.  Current Meds:  Outpatient Encounter Prescriptions as of 05/27/2015  Medication Sig  . albuterol (PROAIR HFA) 108 (90 BASE) MCG/ACT inhaler Inhale 2 puffs into the lungs every 4 (four) hours as needed for wheezing or shortness of breath.  . ALPRAZolam (XANAX) 0.5 MG tablet Take 0.5 mg by  mouth 2 (two) times daily.  . benzonatate (TESSALON) 100 MG capsule Take 100 mg by mouth 3 (three) times daily as needed for cough.   Marland Kitchen buPROPion (WELLBUTRIN XL) 300 MG 24 hr tablet Take 300 mg by mouth every morning.   . citalopram (CELEXA) 40 MG tablet Take 40 mg by mouth every morning.   Marland Kitchen Dextromethorphan-Guaifenesin (MUCINEX DM) 30-600 MG TB12 Take 2 tablets by mouth every 6 (six) hours as needed (for cough/congestion).  . Fluticasone-Salmeterol (ADVAIR) 500-50 MCG/DOSE AEPB Inhale 1 puff into the lungs 2 (two) times daily.  . furosemide (LASIX) 40 MG tablet Take 40 mg by mouth daily.  Marland Kitchen ipratropium-albuterol (DUONEB) 0.5-2.5 (3) MG/3ML SOLN Take 3 mLs by nebulization every 6 (six) hours as needed (for wheezing. Take three times daily for next 3 days).  Marland Kitchen levofloxacin (LEVAQUIN) 750 MG tablet Take 750 mg by mouth daily.  Marland Kitchen levothyroxine (SYNTHROID, LEVOTHROID) 175 MCG tablet Take 175 mcg by mouth daily before breakfast.  . lidocaine-prilocaine (EMLA) cream Apply 1 application topically daily as needed.  . montelukast (SINGULAIR) 10 MG tablet Take 10 mg by mouth at bedtime.   Marland Kitchen morphine (MS CONTIN) 30 MG 12 hr tablet Take 1 tablet (30 mg total) by mouth 2 (two) times daily.  Marland Kitchen morphine (MSIR) 15 MG tablet Take 15 mg by mouth daily as needed for severe pain.  Marland Kitchen omeprazole (PRILOSEC) 40 MG capsule Take 40 mg by  mouth every morning.   . ondansetron (ZOFRAN) 8 MG tablet Take 8 mg by mouth every 8 (eight) hours as needed for nausea or vomiting.   . OXYGEN Inhale 3 L/min into the lungs as needed.  . polyethylene glycol powder (GLYCOLAX/MIRALAX) powder Take 17 g by mouth daily as needed for mild constipation or moderate constipation.   . predniSONE (DELTASONE) 10 MG tablet Take 10 mg by mouth daily with breakfast.  . prochlorperazine (COMPAZINE) 10 MG tablet Take 10 mg by mouth every 6 (six) hours as needed for nausea or vomiting.   . senna  (SENOKOT) 8.6 MG TABS tablet Take 8.6 mg by mouth daily.   . traZODone (DESYREL) 100 MG tablet Take 100 mg by mouth at bedtime as needed for sleep.    No facility-administered encounter medications on file as of 05/27/2015.    Allergy:  Allergies  Allergen Reactions  . Penicillins Swelling and Rash  . Sulfamethoxazole-Trimethoprim Nausea Only    Social Hx:  Social History   Social History  . Marital Status: Divorced    Spouse Name: N/A  . Number of Children: N/A  . Years of Education: N/A   Occupational History  . Not on file.   Social History Main Topics  . Smoking status: Former Smoker -- 1.00 packs/day for 3 years    Types: Cigarettes    Quit date: 10/19/2011  . Smokeless tobacco: Never Used  . Alcohol Use: Yes     Comment: rarely   . Drug Use: No  . Sexual Activity: No   Other Topics Concern  . Not on file   Social History Narrative   Lives with her mom currently, but she usually rents an apartment with her daughter. Occasionally uses a cane.       PCP: Lovenia Shuck             Past Surgical Hx:  Past Surgical History  Procedure Laterality Date  . Knee surgery      x3, two on left and one on right. Arthoscopy  . Cholecystectomy    . Abdominal hysterectomy    . Cesarean section    . Tonsillectomy    . Joint replacement      2x on left, 1x on right    Past Medical Hx:  Past Medical History  Diagnosis Date  . Asthma   . Ulcerative colitis   . Anxiety   . Depression   . Chronic back pain   . Chronic knee pain   . Thyroid disease     hypo  . GERD (gastroesophageal reflux disease)   . Insomnia   . Allergic rhinitis   . Arthritis   . COPD (chronic obstructive pulmonary disease) (HCC)     Past Gynecological History: C/s x 2 No LMP recorded. Patient has had a  hysterectomy.  Family Hx:  Family History  Problem Relation Age of Onset  . High blood pressure Mother   . Bipolar disorder Mother   . Asthma Father   . Parkinson's disease Father     Review of Systems:  Constitutional  Feels well,  ENT Normal appearing ears and nares bilaterally Skin/Breast  No rash, sores, jaundice, itching, dryness Cardiovascular  No chest pain, shortness of breath, or edema  Pulmonary  No cough or wheeze.  Gastro Intestinal  No nausea, vomitting, or diarrhoea. No bright red blood per rectum, no abdominal pain, change in bowel movement, or constipation.  Genito Urinary  No frequency, urgency, dysuria, see HPI Musculo  Skeletal  No myalgia, arthralgia, joint swelling or pain  Neurologic  + dizziness Psychology  No depression, anxiety, insomnia.   Vitals: Blood pressure 116/81, pulse 90, temperature 97.9 F (36.6 C), temperature source Oral, resp. rate 20, height 5\' 2"  (1.575 m), weight 234 lb 4.8 oz (106.278 kg), SpO2 95 %.  Physical Exam: WD in NAD Neck  Supple NROM, without any enlargements.  Lymph Node Survey No cervical supraclavicular or inguinal adenopathy Cardiovascular  Pulse normal rate, regularity and rhythm. S1 and S2 normal.  Lungs  Clear to auscultation bilateraly, without wheezes/crackles/rhonchi. Good air movement.  Skin  Inframammary candidiasis. Psychiatry  Alert and oriented to person, place, and time  Abdomen  Normoactive bowel sounds, abdomen soft, non-tender and obese without evidence of hernia.  Back No CVA tenderness Genito Urinary  Vulva/vagina: deferred Extremities  No bilateral cyanosis, clubbing or edema.  25 minutes of face to face counseling was spent with the patient reviewing her scans, including looking at the images, and discussing treatment options.  Donaciano Eva, MD

## 2015-06-24 NOTE — Progress Notes (Signed)
June 24, 2015, 9:22 AM  Hospital day  2 Antibiotics: none Chemotherapy: none  Outpatient physicians: Everitt Amber, Evlyn Clines, [name]), Guadlupe Spanish, MD(PCP); Marcy Panning (pulmonary, [phone]); Virgel Bouquet (GI, [facility])    EMR reviewed including MRI head done last PM. Discussed directly with Dr Grandville Silos and Dr Denman George on unit. Consult called to radiation oncology. I have LM for dental medicine, as oral sutures were to be removed on 06-23-15. Palliative care consult requested by Dr Grandville Silos, patient and family aware. On enteric precautions awaiting stool specimen for C diff  Subjective: Patient "can't tell much difference" this AM, tho appears more alert and speech improved. No bowel movement since admission, tho family confirms multiple diarrhea stools x several days up until admission,  last formed BM "last Friday". HA this AM, not as severe now. Had difficult time tolerating MRI. Did not sleep much. No vomiting. Feels more agitated on the steroids. No productive cough this AM, breathing "choppy" without wheezing. No improvement in swelling LLE with elevation overnight. Incontinent of urine overnight.   ONCOLOGIC HISTORY Patient presented with >= 1 year of abdominal pain, which was evaluated variously during that time at EDs [place], [place], including CTs. Patient was post vaginal hysterectomy ~ 2005 for cervical dysplasia. In 05-2014 she was found to have gyn abnormalities on CT, referred to Dr [name]. She had pulmonary evaluation by Dr [name] prior to surgery, then exploratory laparotomy 06-03-14 at [facility] with BSO, tumor debulking and right pelvic node evaluation. Pathology [facility] [phone] from 06-03-14) bilateral ovaries and fallopian tubes with high grade serous carcinoma with clear cell changes, with tumor entirely replacing left ovary 12.1 cm maximum diameter and on right also extending into  paraovarian soft tissue, extensive angiolymphatic space involvement, as well as involvement of sigmoid mesentery, posterior cul de sac, right pelvic sidewall, omentum, 6 of 6 nodes involved, and malignant ascites. She was hospitalized again Nov 8-14, 2015 at [place], with pelvic abscess. She received first chemotherapy with weekly taxol carboplatin on 08-05-14 by Dr [name]. She was admitted 08-23-14 with respiratory symptoms and possible gastritis/ colitis, with ANC nadir 0.41 during that hospitalization. She received cycle 2 taxol carboplatin 09-02-14, then was again admitted to [facility] 09-04-14 with acute respiratory failure, requiring several days on ventilator in ICU. At visit with Dr [name] 09-22-14, pulmonary situation was not stable for resuming chemotherapy. She has been followed with observation by gyn oncology and medical oncology at [facility] since 09-2014. CT AP 05-19-15 showed progressive disease, with plan to attempt single agent carboplatin after full dental extractions by Dr Enrique Sack 06-14-15. She was admitted thru ED on 06-23-15 with imaging showing multiple brain metastases.    Objective: Vital signs in last 24 hours: Blood pressure 110/90, pulse 90, temperature 97.9 F (36.6 C), temperature source Oral, resp. rate 14, height 5\' 2"  (1.575 m), weight 226 lb 9.6 oz (102.785 kg), SpO2 96 %. IVF via PAC at 100. Awake, alert, appropriate conversation, up out of bed without assistance but SOB and not managing IV lines with this. Oral mucosa moist and clear. Sutures in gums from recent full mouth extractions. Lungs without wheezing now, no coughing. PAC site ok, tilted so that access is from left. Heart RRR no gallop. Abdomen full and mildly distended, few BS, not tender. LLE 2+ swelling without clear cords or tenderness, no swelling RLE. Feet warm. Moves all extremities equally. CN appear intact. Speech easily intelligible today.   Intake/Output from previous  day: 11/03 0701 -  11/04 0700 In: 1600 [P.O.:1600] Out: 2900 [Urine:2900] Intake/Output this shift: Total I/O In: 640 [P.O.:640] Out: 1300 [Urine:1300]  Lab Results:  Recent Labs  06/23/15 1335 06/24/15 0601  WBC 7.6 6.2  HGB 9.6* 9.4*  HCT 31.2* 30.5*  PLT 277 285   BMET  Recent Labs  06/23/15 1457 06/24/15 0601  NA 134* 137  K 3.8 4.0  CL 98* 100*  CO2 27 27  GLUCOSE 82 191*  BUN 11 12  CREATININE 0.88 0.74  CALCIUM 8.7* 9.1  CA 125 pending. Sputum culture pending C diff ordered Blood and urine cultures from ED 11-3 in process    Studies/Results: Dg Chest 2 View  06/23/2015  CLINICAL DATA:  Shortness of breath, COPD EXAM: CHEST  2 VIEW COMPARISON:  01/17/2015 FINDINGS: Study is limited by poor inspiration. Right IJ Port-A-Cath in place. Central mild vascular congestion without pulmonary edema. Mild basilar atelectasis. No segmental infiltrate. IMPRESSION: Limited study by poor inspiration. Right IJ Port-A-Cath in place. Mild basilar atelectasis. No convincing pulmonary edema. Electronically Signed   By: Lahoma Crocker M.D.   On: 06/23/2015 14:30   Ct Head Wo Contrast  06/23/2015  CLINICAL DATA:  Lethargy, headache EXAM: CT HEAD WITHOUT CONTRAST TECHNIQUE: Contiguous axial images were obtained from the base of the skull through the vertex without intravenous contrast. COMPARISON:  04/01/2013 FINDINGS: This is vasogenic edema in right temporal lobe and right frontal lobe. There is about 7 mm right to left midline shift. Cystic lesion in right frontal lobe measures 3.8 cm. Cystic lesion in right temporal lobe measures 4.6 cm. Second cystic lesion in right frontal lobe measures 2.1 cm. High density lesion in left parietal lobe measures 1 cm. There is a lesion in the region of left caudate measures 1 cm. Findings highly suspicious for metastatic disease. No intracranial hemorrhage. IMPRESSION: Multiple brain lesion as described above highly suspicious for metastatic disease. No intracranial  hemorrhage. There is about 7 mm right to left midline shift. These results were called by telephone at the time of interpretation on 06/23/2015 at 2:38 pm to Dr. Jola Schmidt , who verbally acknowledged these results. Electronically Signed   By: Lahoma Crocker M.D.   On: 06/23/2015 14:39   Mr Jeri Cos HQ Contrast  06/23/2015  CLINICAL DATA:  Follow-up brain lesions. Lethargy and headache. History of ovarian cancer. EXAM: MRI HEAD WITHOUT AND WITH CONTRAST TECHNIQUE: Multiplanar, multiecho pulse sequences of the brain and surrounding structures were obtained without and with intravenous contrast. CONTRAST:  20 cc MultiHance COMPARISON:  CT head June 23, 2015 FINDINGS: Motion degraded examination, multiple sequences are moderately or severely motion degraded. Multiple intermediate to low FLAIR T2 hyperintense masses, generally bright T2 signal throughout the supra and infratentorial brain. Due to motion, examination may under detect small lesions and precise measurements unable to be obtained. All lesions show peripheral or solid enhancement, larger lesions predominately cystic, smaller lesions predominately solid. All lesions show T2 bright surrounding vasogenic edema. Lesions include: 10 x 13 mm RIGHT cerebellar and, 12 x 12 mm RIGHT mesial temporal lobe, 35 x 48 mm RIGHT temporal lobe, 12 x 18 mm mesial LEFT inferior frontal lobe, 18 x 21 mm RIGHT frontal lobe/insula. 13 x 14 mm LEFT corona radiata, 27 x 42 mm RIGHT frontal lobe convexity, 9 x 10 mm RIGHT mesial parietal lobe. 7 mm RIGHT to LEFT midline shift with partial RIGHT lateral ventricle effacement, no definite ventricular entrapment. No susceptibility artifact to suggest hemorrhagic  metastasis. No abnormal extra-axial fluid collection, suspicious extra-axial enhancement nor extra-axial masses. Normal major intracranial vascular flow voids seen at the skull base. Paranasal sinus mucosal thickening without air-fluid levels. The mastoid air cells are well  aerated. Due to motion, limited assessment for calvarial metastasis though none is grossly evident. No abnormal sellar expansion. No cerebellar tonsillar ectopia. Patient is edentulous. IMPRESSION: Multiple sequences are moderately or severely motion degraded, decreasing sensitivity for intracranial metastasis. At least 1 infratentorial and 7 supratentorial enhancing metastasis, largest in RIGHT temporal lobe measuring 35 x 48 mm. 7 mm RIGHT to LEFT midline shift without ventricular entrapment. Electronically Signed   By: Elon Alas M.D.   On: 06/23/2015 21:57   PACs images CT and MRI reviewed now with patient, mother and daughter.   DISCUSSION:  CT and MRI findings reviewed. In setting of known progressive, high grade ovarian cancer, findings support metastatic disease to brain. She is some better this AM by my exam, related to steroids begun 06-23-15. Patient and family understand that this is stage 4 cancer and that brain involvement takes precedence now over systemic treatment for the recurrent ovarian cancer. She needs to continue steroids and consultation has been requested with radiation oncology. Any chemotherapy would be done after completion of radiation, depending on status then. I have told patient that treatment of brain mets will not be curative but hopefully will give some improvement in related symptoms. I expect that she will be in hospital to allow initiation of radiation, likely at least until early next week,  then radiation may be completed outpatient if family is able to manage at home. She does not have Advance Directives or [place], tho wants mother for that, and would like to complete this paperwork in hospital. They are aware that Palliative Care team will meet with them, had good experience with Hospice with [place] father. We did not discuss timing of Hospice, given possible chemotherapy.  Will get venous doppler on LLE. If no DVT, may be useful to repeat CT AP as swelling  otherwise likely from progressive disease in pelvis/ nodes that may also respond quickly to directed radiation.   Assessment/Plan: 1.multiple solid and cystic brain lesions frontal, temporal, parietal, cerebellar by CT and MRI consistent with metastatic disease from known high grade serous/ clear cell ovarian cancer recently progressive in pelvis. Clinically better in past 12 hours with IV steroids. Radiation consult requested. Palliative care to see, particularly for discussion of goals of care. 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 Recurrent disease by CT AP 05-19-15. Hold chemo at least until radiation completed, then consider single agent carboplatin in palliative attempt as previously planned  3.Severe chronic lung disease with recurrent infections and previous ventilator support after cycle 2 adjuvant chemotherapy. Frequent antibiotics and steroids outpatient. Did have bronchoscopy at [facility].  4.diarrhea x 4 days PTA: multiple courses of antibiotics including quinolones, and steroids in last several months. Stool for C diff ordered. 5.PAC in  6.chronic pain since tractor accident in childhood: outpatient pain meds by PCP with assistance of mother. Per my previous notes, past incarceration for drugs. 7.full dental extractions 06-14-15. I have LM with [facility] dental medicine notifying them of admission, as it would be most helpful if the sutures could be removed while she is inpatient 8.family history breast and ovarian cancer in mother. I will let [place] genetics counselors know of admission.  Note testing could also be done on mother as index. 9.flu vaccine done 04-21-15 10.Patient wants to complete Advance Directives. Per conversation last pm,  patient does not want to be maintained on life support in irreversible situation. Palliative Care  discussion likely will be very helpful. 11.morbid obesity, GERD, lap cholecystectomy, vaginal hysterectomy for cervical indications, hypothyroid. 12.swelling LLE: progressive. Need to be sure no DVT, otherwise likely related to malignancy in pelvis.   I will follow up this weekend. Please page between my rounds if I can be of help [phone], or medical oncology MD on call  LIVESAY,LENNIS P

## 2015-06-24 NOTE — Progress Notes (Signed)
  Radiation Oncology         [phone] ________________________________  Name: Debra [name] MRN: [id number]  Date: 06/24/2015  DOB: [birth year]   INPATIENT  SIMULATION AND TREATMENT PLANNING NOTE    ICD-9-CM ICD-10-CM   1. Brain metastasis (HCC) 198.3 C79.31 LORazepam (ATIVAN) tablet 1 mg  2. Metastasis to brain (HCC) 198.3 C79.31 LORazepam (ATIVAN) tablet 1 mg  3. Anxiety 300.00 F41.9 LORazepam (ATIVAN) tablet 1 mg    DIAGNOSIS:  44 y.o. 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  NARRATIVE:  The patient was brought to the [facility].  Identity was confirmed.  All relevant records and images related to the planned course of therapy were reviewed.  The patient freely provided informed written consent to proceed with treatment after reviewing the details related to the planned course of therapy. The consent form was witnessed and verified by the simulation staff.  Then, the patient was set-up in a stable reproducible  supine position for radiation therapy.  CT images were obtained.  Surface markings were placed.  The CT images were loaded into the planning software.  Then the target and avoidance structures were contoured.  Treatment planning then occurred.  The radiation prescription was entered and confirmed.  Then, I designed and supervised the construction of a total of 3 medically necessary complex treatment devices, including a custom made thermoplastic mask used for immobilization and two complex multileaf collimators to cover the entire intracranial contents, while shielding the eyes and face.  Each [facility] is independently created to account for beam divergence.  The right and left lateral fields will be treated with 6 MV X-rays.  I have requested : Isodose Plan.    PLAN:  The whole brain will be treated to 35 Gy in 14  fractions.  ________________________________  Debra [name], M.D.

## 2015-06-25 ENCOUNTER — Encounter: Payer: Self-pay | Admitting: Oncology

## 2015-06-25 DIAGNOSIS — G40109 Localization-related (focal) (partial) symptomatic epilepsy and epileptic syndromes with simple partial seizures, not intractable, without status epilepticus: Secondary | ICD-10-CM

## 2015-06-25 DIAGNOSIS — D72829 Elevated white blood cell count, unspecified: Secondary | ICD-10-CM

## 2015-06-25 DIAGNOSIS — R609 Edema, unspecified: Secondary | ICD-10-CM

## 2015-06-25 LAB — GLUCOSE, CAPILLARY: Glucose-Capillary: 130 mg/dL — ABNORMAL HIGH (ref 65–99)

## 2015-06-25 LAB — BASIC METABOLIC PANEL
ANION GAP: 6 (ref 5–15)
BUN: 16 mg/dL (ref 6–20)
CO2: 29 mmol/L (ref 22–32)
Calcium: 8.9 mg/dL (ref 8.9–10.3)
Chloride: 101 mmol/L (ref 101–111)
Creatinine, Ser: 0.84 mg/dL (ref 0.44–1.00)
GFR calc Af Amer: >60 mL/min (ref >60)
GLUCOSE: 144 mg/dL — AB (ref 65–99)
POTASSIUM: 4.5 mmol/L (ref 3.5–5.1)
Sodium: 136 mmol/L (ref 135–145)

## 2015-06-25 LAB — URINE CULTURE

## 2015-06-25 LAB — CBC
HCT: 28.1 % — ABNORMAL LOW (ref 36.0–46.0)
Hemoglobin: 8.6 g/dL — ABNORMAL LOW (ref 12.0–15.0)
MCH: 26.1 pg (ref 26.0–34.0)
MCHC: 30.6 g/dL (ref 30.0–36.0)
MCV: 85.4 fL (ref 78.0–100.0)
PLATELETS: 276 10*3/uL (ref 150–400)
RBC: 3.29 MIL/uL — AB (ref 3.87–5.11)
RDW: 16.7 % — ABNORMAL HIGH (ref 11.5–15.5)
WBC: 14.3 10*3/uL — ABNORMAL HIGH (ref 4.0–10.5)

## 2015-06-25 LAB — CA 125: CA 125: 59.8 U/mL — ABNORMAL HIGH (ref 0.0–38.1)

## 2015-06-25 LAB — MAGNESIUM: MAGNESIUM: 2.1 mg/dL (ref 1.7–2.4)

## 2015-06-25 MED ORDER — ALBUTEROL SULFATE HFA 108 (90 BASE) MCG/ACT IN AERS: 2.0000 | INHALATION_SPRAY | RESPIRATORY_TRACT | Status: DC | PRN

## 2015-06-25 MED ORDER — SODIUM CHLORIDE 0.9 % IV SOLN
1000.0000 mg | Freq: Once | INTRAVENOUS | Status: AC
  Administered 2015-06-25: 1000 mg via INTRAVENOUS
  Filled 2015-06-25: qty 10

## 2015-06-25 MED ORDER — LEVETIRACETAM 500 MG PO TABS
500.0000 mg | ORAL_TABLET | Freq: Two times a day (BID) | ORAL | Status: DC
  Administered 2015-06-25 – 2015-06-30 (×10): 500 mg via ORAL
  Filled 2015-06-25 (×11): qty 1

## 2015-06-25 MED ORDER — LORAZEPAM 2 MG/ML IJ SOLN: 2.0000 mg | Freq: Once | INTRAMUSCULAR | Status: DC

## 2015-06-25 MED ORDER — ALBUTEROL SULFATE (2.5 MG/3ML) 0.083% IN NEBU
2.5000 mg | INHALATION_SOLUTION | RESPIRATORY_TRACT | Status: DC | PRN
  Administered 2015-06-25 – 2015-06-30 (×5): 2.5 mg via RESPIRATORY_TRACT
  Filled 2015-06-25 (×5): qty 3

## 2015-06-25 MED ORDER — ALPRAZOLAM 0.5 MG PO TABS
0.5000 mg | ORAL_TABLET | Freq: Three times a day (TID) | ORAL | Status: DC | PRN
  Administered 2015-06-27 – 2015-06-29 (×5): 0.5 mg via ORAL
  Filled 2015-06-25 (×5): qty 1

## 2015-06-25 MED ORDER — LORAZEPAM 2 MG/ML IJ SOLN
1.0000 mg | INTRAMUSCULAR | Status: DC | PRN
  Administered 2015-06-26 – 2015-06-28 (×4): 1 mg via INTRAVENOUS
  Filled 2015-06-25 (×6): qty 1

## 2015-06-25 MED ORDER — LORAZEPAM 2 MG/ML IJ SOLN
1.0000 mg | Freq: Once | INTRAMUSCULAR | Status: AC
  Administered 2015-06-25: 1 mg via INTRAVENOUS

## 2015-06-25 NOTE — Progress Notes (Signed)
   06/25/15 0900  PT Visit Information  Last PT Received On 06/24/15    Will see if schedule permits, aware of brain mets, fall after PT eval-- pt with higher level balance issues/impulsivity ; will change D/C plan to HHPT in light of yesterday's events.   PT - Assessment/Plan  PT Plan Discharge plan needs to be updated  Follow Up Recommendations Home health PT;Supervision for mobility/OOB   Kenyon Ana, PT Pager: [phone] 06/25/2015

## 2015-06-25 NOTE — Progress Notes (Signed)
Medical Oncology June 25, 2015, 8:26 AM  Hospital day 3 Antibiotics: none Chemotherapy: none  Outpatient physicians: Everitt Amber, Evlyn Clines, [name]), Guadlupe Spanish, MD(PCP); Marcy Panning (pulmonary, [phone]); Virgel Bouquet (GI, [place])   EMR reviewed since my rounds on 06-24-15: Appreciate radiation oncology consult by Dr Tammi Klippel, simulation accomplished 11-4 with plan for treatment of whole brain to 35 Gy in 14 fractions beginning 06-27-15. Palliative care met with patient and family.  Oral sutures removed by Dr Enrique Sack, bid chlorhexidine mouth washes as well as salt water rinses q 2 hours. LLE venous doppler negative for DVT. Patient evaluated by PT with no specific PT needs identified, then fell in bathroom later in day with bruising left arm/flank/shin.   No visitors here presently. Subjective: Slept better last pm. Headache only mild now. One formed bowel movement in last 24 hours, no diarrhea since admission/ no stool for C diff obtained (? consider DC enteric precautions). Eating some soft foods. Nebs helping breathing, also steroids, no productive cough now. Some GERD, no nausea, on bid protonix. "Scared" with metastatic cancer, which we have discussed.  Needs letter for probation officer, as she missed appointment day of admission. Letter fixed in EMR now.  Objective: Vital signs in last 24 hours: Blood pressure 121/73, pulse 65, temperature 98.3 F (36.8 C), temperature source Oral, resp. rate 17, height _0  (1.575 m), weight 226 lb 9.6 oz (102.785 kg), SpO2 93 %. Awake, alert, NAD supine in bed on RA. Face flushed with steroids. No cough. Speech fluent and appropriate. PERRL, not icteric. Oral mucosa moist, sutures out. PAC site ok, IVF at 44. Lungs with diminished BS thruout, no wheezes or crackles. Heart RRR. Abdomen soft, not tender, few BS, not more distended. LE 2+ swelling LLE not tight, no cords. No pitting edema RLE. Feet warm.  Ecchymoses left forearm and slight on left shin, large area of bruising left flank. Moves all extremities in bed.   Intake/Output from previous day: 11/04 0701 - 11/05 0700 In: 3482 [[name].O.:1600; I.V.:1882] Out: 3350 [Urine:3350] Intake/Output this shift:     Lab Results:  Recent Labs  06/24/15 0601 06/25/15 0445  WBC 6.2 14.3*  HGB 9.4* 8.6*  HCT 30.5* 28.1*  PLT 285 276   Lower Hgb may be with hydration   BMET  Recent Labs  06/24/15 0601 06/25/15 0445  NA 137 136  K 4.0 4.5  CL 100* 101  CO2 27 29  GLUCOSE 191* 144*  BUN 12 16  CREATININE 0.74 0.84  CALCIUM 9.1 8.9   Blood cultures negative to date from 06-23-15  Studies/Results: Dg Chest 2 View  06/23/2015  CLINICAL DATA:  Shortness of breath, COPD EXAM: CHEST  2 VIEW COMPARISON:  01/17/2015 FINDINGS: Study is limited by poor inspiration. Right IJ Port-A-Cath in place. Central mild vascular congestion without pulmonary edema. Mild basilar atelectasis. No segmental infiltrate. IMPRESSION: Limited study by poor inspiration. Right IJ Port-A-Cath in place. Mild basilar atelectasis. No convincing pulmonary edema. Electronically Signed   By: Lahoma Crocker M.D.   On: 06/23/2015 14:30   Ct Head Wo Contrast  06/23/2015  CLINICAL DATA:  Lethargy, headache EXAM: CT HEAD WITHOUT CONTRAST TECHNIQUE: Contiguous axial images were obtained from the base of the skull through the vertex without intravenous contrast. COMPARISON:  04/01/2013 FINDINGS: This is vasogenic edema in right temporal lobe and right frontal lobe. There is about 7 mm right to left midline shift. Cystic lesion in right frontal lobe measures 3.8 cm. Cystic lesion  in right temporal lobe measures 4.6 cm. Second cystic lesion in right frontal lobe measures 2.1 cm. High density lesion in left parietal lobe measures 1 cm. There is a lesion in the region of left caudate measures 1 cm. Findings highly suspicious for metastatic disease. No intracranial hemorrhage. IMPRESSION:  Multiple brain lesion as described above highly suspicious for metastatic disease. No intracranial hemorrhage. There is about 7 mm right to left midline shift. These results were called by telephone at the time of interpretation on 06/23/2015 at 2:38 pm to Dr. Jola Schmidt , who verbally acknowledged these results. Electronically Signed   By: Lahoma Crocker M.D.   On: 06/23/2015 14:39   Mr Jeri Cos [place] Contrast  06/23/2015  CLINICAL DATA:  Follow-up brain lesions. Lethargy and headache. History of ovarian cancer. EXAM: MRI HEAD WITHOUT AND WITH CONTRAST TECHNIQUE: Multiplanar, multiecho pulse sequences of the brain and surrounding structures were obtained without and with intravenous contrast. CONTRAST:  20 cc MultiHance COMPARISON:  CT head June 23, 2015 FINDINGS: Motion degraded examination, multiple sequences are moderately or severely motion degraded. Multiple intermediate to low FLAIR T2 hyperintense masses, generally bright T2 signal throughout the supra and infratentorial brain. Due to motion, examination may under detect small lesions and precise measurements unable to be obtained. All lesions show peripheral or solid enhancement, larger lesions predominately cystic, smaller lesions predominately solid. All lesions show T2 bright surrounding vasogenic edema. Lesions include: 10 x 13 mm RIGHT cerebellar and, 12 x 12 mm RIGHT mesial temporal lobe, 35 x 48 mm RIGHT temporal lobe, 12 x 18 mm mesial LEFT inferior frontal lobe, 18 x 21 mm RIGHT frontal lobe/insula. 13 x 14 mm LEFT corona radiata, 27 x 42 mm RIGHT frontal lobe convexity, 9 x 10 mm RIGHT mesial parietal lobe. 7 mm RIGHT to LEFT midline shift with partial RIGHT lateral ventricle effacement, no definite ventricular entrapment. No susceptibility artifact to suggest hemorrhagic metastasis. No abnormal extra-axial fluid collection, suspicious extra-axial enhancement nor extra-axial masses. Normal major intracranial vascular flow voids seen at the skull  base. Paranasal sinus mucosal thickening without air-fluid levels. The mastoid air cells are well aerated. Due to motion, limited assessment for calvarial metastasis though none is grossly evident. No abnormal sellar expansion. No cerebellar tonsillar ectopia. Patient is edentulous. IMPRESSION: Multiple sequences are moderately or severely motion degraded, decreasing sensitivity for intracranial metastasis. At least 1 infratentorial and 7 supratentorial enhancing metastasis, largest in RIGHT temporal lobe measuring 35 x 48 mm. 7 mm RIGHT to LEFT midline shift without ventricular entrapment. Electronically Signed   By: Elon Alas M.D.   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 On IV decadron, whole brain RT to begin 06-27-15. Palliative care consult done. Code status still listed as full code; patient has expressed to this MD that she would not want to be on life support in irreversible situation.  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 Recurrent disease by CT AP 05-19-15. Hold chemo at least until radiation completed, then consider single agent carboplatin in palliative attempt depending on performance status and overall situation 3.Severe chronic lung disease with recurrent infections and previous ventilator support after cycle 2 adjuvant chemotherapy. Frequent antibiotics and steroids  outpatient. Bronchoscopy at [facility] 2012 per [facility] 4.diarrhea x 4 days PTA: multiple courses of antibiotics including quinolones, and steroids in last several months. No diarrhea since admission, with formed stool in last 24 hours. DC enteric  precautions/ C diff unless diarrhea reoccurs 5.PAC in  6.chronic pain since tractor accident in childhood: outpatient pain meds by PCP with assistance of mother.  7.full dental extractions 06-14-15, sutures removed 06-24-15 8.family history breast and ovarian cancer in mother.  Note testing could also be done on mother as index. 9.flu vaccine done 04-21-15 10.Patient wants to complete Advance Directives. Per conversation last pm, patient does not want to be maintained on life support in irreversible situation. Palliative Care saw 11-4. 11.morbid obesity, GERD, lap cholecystectomy, vaginal hysterectomy for cervical indications, hypothyroid. 12.swelling LLE: no DVT by doppler 06-24-15.  13. History of past incarceration for drugs, now on probation. Letter to confirm hospital admission written for her probation officer now.  Please page me [phone], or call med onc on call, if needed prior to my next rounds Thank you  Debra [name]

## 2015-06-25 NOTE — Evaluation (Signed)
Occupational Therapy Evaluation Patient Details Name: Debra [name] MRN: [id number] DOB: [birth year] Today's Date: 06/25/2015    History of Present Illness 44 yo female with metastatic brain cancer, has hx of ovarian cancer, GERD, depression, asthma, chronic pain, respiratory failure, hypothyroidism, and morbid obesety    Clinical Impression   This pt was admitted for the above.  She was independent with adls prior to admission. Pt is a very high falls risk and she has had 2 falls in the hospital including one during this evaluation.  Will plan to have +2 assistance for all tx.  Although pt moves well, she may need SNF for safety. Will follow with min guard +2 assistance level goals in acute.  Will also plan to use AE as she fell forward from sitting during this session and reports she fell forward yesterday.    Follow Up Recommendations  SNF (vs 24/7 [place], depending upon progress)    Equipment Recommendations  3 in 1 bedside comode    Recommendations for Other Services       Precautions / Restrictions Precautions Precautions: Fall Restrictions Weight Bearing Restrictions: No      Mobility Bed Mobility Overal bed mobility: Needs Assistance Bed Mobility: Supine to Sit     Supine to sit: Min guard     General bed mobility comments: contact guard for safety  Transfers   Equipment used: 1 person hand held assist   Sit to Stand: Min guard;+2 safety/equipment         General transfer comment: +2 recommended for safety:  knees buckled     Balance       Sitting balance - Comments: initially sitting balance was good but after returning to sitting from standing, pt fell forward--body was shaking                                    ADL Overall ADL's : Needs assistance/impaired     Grooming: Wash/dry face;Set up;Sitting   Upper Body Bathing: Minimal assitance;Sitting   Lower Body Bathing: Moderate assistance;+2 for safety/equipment;Sit to/from  stand   Upper Body Dressing : Set up;Sitting   Lower Body Dressing: Maximal assistance;+2 for safety/equipment;Sit to/from stand                 General ADL Comments: during evaluation, pt stood up unexpectedly at min guard level.  When sitting back down, bil knees began to buckle and assisted to seated position.  Pt was shaky and fell forward--therapist controlled fall to Face/shoulders onto recliner.  Staff assisted to floor and used maximove to assist back to bed.       Vision     Perception     Praxis      Pertinent Vitals/Pain Pain Assessment: Faces Faces Pain Scale: Hurts a little bit Pain Location: shins Pain Intervention(s): Limited activity within patient's tolerance;Monitored during session     Hand Dominance     Extremity/Trunk Assessment Upper Extremity Assessment Upper Extremity Assessment:  (had tremors at time of evaluation)           Communication Communication Communication: No difficulties   Cognition Arousal/Alertness: Awake/alert Behavior During Therapy: WFL for tasks assessed/performed Overall Cognitive Status: Impaired/Different from baseline Area of Impairment: Safety/judgement               General Comments: pt moves quickly; cues for safety   General Comments       Exercises  Shoulder Instructions      Home Living Family/patient expects to be discharged to:: Private residence Living Arrangements: Parent;Children Available Help at Discharge: Family Type of Home: House                                  Prior Functioning/Environment Level of Independence: Independent             OT Diagnosis: Generalized weakness   OT Problem List: Decreased strength;Decreased activity tolerance;Impaired balance (sitting and/or standing);Decreased safety awareness;Decreased knowledge of use of DME or AE;Pain   OT Treatment/Interventions: Self-care/ADL training;DME and/or AE instruction;Patient/family  education;Balance training    OT Goals(Current goals can be found in the care plan section) Acute Rehab OT Goals Patient Stated Goal: to get treatment for cancer and get well OT Goal Formulation:  (unable to participate in goal setting at time of eval) Time For Goal Achievement: 07/09/15 Potential to Achieve Goals: Fair ADL Goals Pt Will Transfer to Toilet: with min guard assist;with +2 assist;bedside commode;stand pivot transfer Additional ADL Goal #1: Pt will perform LB adls from supported sitting with reacher with min guard A +2 (bathing and pants) Additional ADL Goal #2: Pt will perform ADLs and toilet transfers with only one safety cue per session  OT Frequency: Min 2X/week   Barriers to D/C:            Co-evaluation              End of Session    Activity Tolerance: Treatment limited secondary to medical complications (Comment) Patient left: in bed;with call bell/phone within reach;with bed alarm set;with nursing/sitter in room   Time: 0912-0932 OT Time Calculation (min): 20 min Charges:  OT General Charges $OT Visit: 1 Procedure OT Evaluation $Initial OT Evaluation Tier I: 1 Procedure G-Codes:    Danyell Awbrey 2015-07-14, 10:59 AM  Lesle Chris, OTR/L [phone] 07-14-15

## 2015-06-25 NOTE — Progress Notes (Signed)
[facility] PROGRESS NOTE  Debra [name] MWN:[id number] DOB: [birth year] DOA: 06/23/2015 PCP: Guadlupe Spanish, MD  Assessment/Plan: #1 brain metastases Patient noted on head CT scan to have some brain metastases. 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 Patient with some clinical improvement. MRI of the head which was done was consistent with probable brain metastases. Continue current dose of IV Decadron. Patient has been seen by oncology and patient will be referred to radiation oncology for further evaluation. Patient has been assessed by radiation oncology and underwent simulation yesterday 06/24/2015. Patient to start radiation treatments on Monday, 06/27/2015. Oncology following and appreciate input and recommendations.  #2 simple partial seizures Likely secondary to brain metastases. Patient noted this morning to have some jerking motions consistent with seizure activity and slumped forward. She was no tongue biting or urinary incontinence. No significant post ictal state. Will load patient with Keppra 1 g IV 1 and then placed on Keppra 500 mg twice daily. Ativan as needed. No need for EEG at this time. Seizure precautions. Case discussed with neurology, Dr. Leonel Ramsay who is in agreement with current plan.  #3 acute encephalopathy/ataxia Secondary to problem #1. Infectious workup negative to date. Continue IV Decadron. Patient has been seen by radiation oncology and patient underwent simulation yesterday 06/24/2015 and to start radiation treatments on Monday, 06/27/2015. Radiation oncology and oncology following. PT/OT.  #4 fall Patient noted to fall yesterday while in the bathroom. Likely secondary to cerebellar metastatic disease. PT/OT. Patient to start radiation treatments on Monday, 06/27/2015.  #5 chronic respiratory failure on 3 L nasal cannula Stable. Continue Spiriva and Advair. Nebulizer treatments  as needed.  #6 gastroesophageal reflux disease PPI.  #7 depression/anxiety Continue Celexa and Wellbutrin. Xanax dose has been adjusted per palliative care.  #8 hypothyroidism Continue current dose of Synthroid.  #9 chronic pain syndrome since tractor accident in childhood Continue MS Contin twice daily. Percocet for breakthrough pain.  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 Per oncology chemotherapy to be held until radiation is completed. Per oncology.  #11 anemia Stable. Follow H&H. Transfusion threshold hemoglobin less than 7.  #12 full dental extraction Patient had dental instruction done on 06/14/2015. Sutures were removed yesterday 06/24/2015 per Dr. Enrique Sack.  #13 left lower extremity edema/lymphedema Dopplers done on 06/24/2015 negative for DVT.  14 diarrhea Patient had diarrhea 4 days prior to admission. Patient has had no diarrhea since admission and has had formed stool. Will discontinue enteric precautions.  #15 leukocytosis Likely secondary to steroids. Follow.  #16 prophylaxis PPI for GI prophylaxis. Lovenox for DVT prophylaxis.  Code Status: Full Family Communication: Updated patient and mother and daughter at bedside. Disposition Plan: Pending further evaluation of metastatic brain disease.   Consultants:  Oncology: Dr. Marko Plume 06/23/2015  Palliative care: Dr. Rowe Pavy 06/24/2015  Radiation oncology: Dr. Tammi Klippel 06/24/2015  Procedures:  CT head 06/23/2015  MRI head 06/23/2015  Chest x-ray 06/23/2015  Left lower extremity Doppler 06/24/2015  Antibiotics:  None  HPI/Subjective: Patient states feeling some better. Patient denies any chest pain. No shortness of breath. After examined the patient was notified that patient had some jerking motions and slumped forward. Patient with no tongue biting. Patient with no  urinary incontinence. Patient coherent and answering questions appropriately.  Objective: Filed Vitals:   06/25/15 0932  BP: 154/79  Pulse: 85  Temp:   Resp: 16  Intake/Output Summary (Last 24 hours) at 06/25/15 0943 Last data filed at 06/25/15 0837  Gross per 24 hour  Intake   2842 ml  Output   2400 ml  Net    442 ml   Filed Weights   06/23/15 1657 06/24/15 0354  Weight: 102.059 kg (225 lb) 102.785 kg (226 lb 9.6 oz)    Exam:   General:  NAD. Bruises noted on left flank, left arm and left shin.  Cardiovascular: RRR  Respiratory: CTAB  Abdomen: Soft, nontender, nondistended, positive bowel sounds.  Musculoskeletal: No clubbing cyanosis. Left lower extremity lymphedema.   Data Reviewed: Basic Metabolic Panel:  Recent Labs Lab 06/23/15 1457 06/23/15 1950 06/24/15 0601 06/25/15 0445  NA 134*  --  137 136  K 3.8  --  4.0 4.5  CL 98*  --  100* 101  CO2 27  --  27 29  GLUCOSE 82  --  191* 144*  BUN 11  --  12 16  CREATININE 0.88  --  0.74 0.84  CALCIUM 8.7*  --  9.1 8.9  MG  --  1.6*  --  2.1   Liver Function Tests:  Recent Labs Lab 06/23/15 1457 06/24/15 0601  AST 15 19  ALT 12* 13*  ALKPHOS 54 54  BILITOT 0.3 0.3  PROT 6.7 6.7  ALBUMIN 3.4* 3.3*   No results for input(s): LIPASE, AMYLASE in the last 168 hours. No results for input(s): AMMONIA in the last 168 hours. CBC:  Recent Labs Lab 06/23/15 1335 06/24/15 0601 06/25/15 0445  WBC 7.6 6.2 14.3*  NEUTROABS 4.5  --   --   HGB 9.6* 9.4* 8.6*  HCT 31.2* 30.5* 28.1*  MCV 87.2 87.9 85.4  PLT 277 285 276   Cardiac Enzymes:  Recent Labs Lab 06/23/15 1457  TROPONINI <0.03   BNP (last 3 results) No results for input(s): BNP in the last 8760 hours.  ProBNP (last 3 results) No results for input(s): PROBNP in the last 8760 hours.  CBG:  Recent Labs Lab 06/25/15 0732  GLUCAP 130*    Recent Results (from the past 240 hour(s))  Culture, Urine     Status: None (Preliminary  result)   Collection Time: 06/23/15  4:43 PM  Result Value Ref Range Status   Specimen Description URINE, CLEAN CATCH  Final   Special Requests NONE  Final   Culture   Final    TOO YOUNG TO READ Performed at [facility]    Report Status PENDING  Incomplete  Culture, blood (routine x 2)     Status: None (Preliminary result)   Collection Time: 06/23/15  7:50 PM  Result Value Ref Range Status   Specimen Description BLOOD RIGHT HAND  Final   Special Requests BOTTLES DRAWN AEROBIC AND ANAEROBIC  5CC  Final   Culture   Final    NO GROWTH 2 DAYS Performed at [facility]    Report Status PENDING  Incomplete  Culture, blood (routine x 2)     Status: None (Preliminary result)   Collection Time: 06/23/15  8:00 PM  Result Value Ref Range Status   Specimen Description BLOOD LEFT HAND  Final   Special Requests BOTTLES DRAWN AEROBIC AND ANAEROBIC  10CC  Final   Culture   Final    NO GROWTH 2 DAYS Performed at [facility]    Report Status PENDING  Incomplete     Studies: Dg Chest 2 View  06/23/2015  CLINICAL DATA:  Shortness of breath, COPD EXAM: CHEST  2 VIEW COMPARISON:  01/17/2015 FINDINGS: Study is limited by poor inspiration. Right IJ Port-A-Cath in place. Central mild vascular congestion without pulmonary edema. Mild basilar atelectasis. No segmental infiltrate. IMPRESSION: Limited study by poor inspiration. Right IJ Port-A-Cath in place. Mild basilar atelectasis. No convincing pulmonary edema. Electronically Signed   By: Lahoma Crocker M.D.   On: 06/23/2015 14:30   Ct Head Wo Contrast  06/23/2015  CLINICAL DATA:  Lethargy, headache EXAM: CT HEAD WITHOUT CONTRAST TECHNIQUE: Contiguous axial images were obtained from the base of the skull through the vertex without intravenous contrast. COMPARISON:  04/01/2013 FINDINGS: This is vasogenic edema in right temporal lobe and right frontal lobe. There is about 7 mm right to left midline shift. Cystic lesion in right frontal  lobe measures 3.8 cm. Cystic lesion in right temporal lobe measures 4.6 cm. Second cystic lesion in right frontal lobe measures 2.1 cm. High density lesion in left parietal lobe measures 1 cm. There is a lesion in the region of left caudate measures 1 cm. Findings highly suspicious for metastatic disease. No intracranial hemorrhage. IMPRESSION: Multiple brain lesion as described above highly suspicious for metastatic disease. No intracranial hemorrhage. There is about 7 mm right to left midline shift. These results were called by telephone at the time of interpretation on 06/23/2015 at 2:38 pm to Dr. Jola Schmidt , who verbally acknowledged these results. Electronically Signed   By: Lahoma Crocker M.D.   On: 06/23/2015 14:39   Mr Jeri Cos BO Contrast  06/23/2015  CLINICAL DATA:  Follow-up brain lesions. Lethargy and headache. History of ovarian cancer. EXAM: MRI HEAD WITHOUT AND WITH CONTRAST TECHNIQUE: Multiplanar, multiecho pulse sequences of the brain and surrounding structures were obtained without and with intravenous contrast. CONTRAST:  20 cc MultiHance COMPARISON:  CT head June 23, 2015 FINDINGS: Motion degraded examination, multiple sequences are moderately or severely motion degraded. Multiple intermediate to low FLAIR T2 hyperintense masses, generally bright T2 signal throughout the supra and infratentorial brain. Due to motion, examination may under detect small lesions and precise measurements unable to be obtained. All lesions show peripheral or solid enhancement, larger lesions predominately cystic, smaller lesions predominately solid. All lesions show T2 bright surrounding vasogenic edema. Lesions include: 10 x 13 mm RIGHT cerebellar and, 12 x 12 mm RIGHT mesial temporal lobe, 35 x 48 mm RIGHT temporal lobe, 12 x 18 mm mesial LEFT inferior frontal lobe, 18 x 21 mm RIGHT frontal lobe/insula. 13 x 14 mm LEFT corona radiata, 27 x 42 mm RIGHT frontal lobe convexity, 9 x 10 mm RIGHT mesial parietal lobe.  7 mm RIGHT to LEFT midline shift with partial RIGHT lateral ventricle effacement, no definite ventricular entrapment. No susceptibility artifact to suggest hemorrhagic metastasis. No abnormal extra-axial fluid collection, suspicious extra-axial enhancement nor extra-axial masses. Normal major intracranial vascular flow voids seen at the skull base. Paranasal sinus mucosal thickening without air-fluid levels. The mastoid air cells are well aerated. Due to motion, limited assessment for calvarial metastasis though none is grossly evident. No abnormal sellar expansion. No cerebellar tonsillar ectopia. Patient is edentulous. IMPRESSION: Multiple sequences are moderately or severely motion degraded, decreasing sensitivity for intracranial metastasis. At least 1 infratentorial and 7 supratentorial enhancing metastasis, largest in RIGHT temporal lobe measuring 35 x 48 mm. 7 mm RIGHT to LEFT midline shift without ventricular entrapment. Electronically Signed   By: Elon Alas M.D.   On: 06/23/2015 21:57    Scheduled Meds: . ALPRAZolam  0.5 mg Oral TID  . antiseptic oral rinse  7 mL Mouth Rinse q12n4p  . buPROPion  150 mg Oral BID  . chlorhexidine  15 mL Mouth Rinse BID  . citalopram  40 mg Oral q morning - 10a  . dexamethasone  10 mg Intravenous 4 times per day  . enoxaparin (LOVENOX) injection  50 mg Subcutaneous Q24H  . furosemide  40 mg Oral Daily  . ipratropium-albuterol  3 mL Nebulization TID  . levETIRAcetam  1,000 mg Intravenous Once  . levETIRAcetam  500 mg Oral BID  . levothyroxine  175 mcg Oral QAC breakfast  . mometasone-formoterol  2 puff Inhalation BID  . montelukast  10 mg Oral QHS  . morphine  30 mg Oral BID  . pantoprazole  40 mg Oral BID AC   Continuous Infusions: . sodium chloride 75 mL/hr at 06/25/15 0448  . sodium chloride irrigation      Principal Problem:   Brain metastasis (HCC) Active Problems:   Seizure disorder, simple partial ([place]): Likely secondary to brain  mets   Anxiety   Depression   Thyroid disease   GERD (gastroesophageal reflux disease)   Leukocytosis   Normocytic anemia   COPD (chronic obstructive pulmonary disease) (HCC)   Anemia   Ovarian cancer, bilateral (HCC)   Chronic pain due to injury   Asthma, moderate persistent   Obesity, morbid, BMI 40.0-49.9 (HCC)   On home oxygen therapy   Acute encephalopathy   Ataxia   Chronic respiratory failure ([place]): 3L [place]   Metastasis to brain [facility])   Encounter for palliative care    Time spent: 52 minutes    [facility] M.D. [facility] Pager [phone]. If 7PM-7AM, please contact night-coverage at [email or web address], password [facility] 06/25/2015, 9:43 AM  LOS: 2 days

## 2015-06-25 NOTE — Progress Notes (Signed)
Pt. Fell in room with PT. Per PT, pt. may have had a seizure. When RN entered room, pt. alert and oriented x 4. Assisted back to bed with lift, vital signs stable, and no acute injury noted, but states has some back pain that is a chronic pain.  MD notified and here at bedside. Family notified of Fal,l Debra [name] pt. sister. Continue to monitor. Pt. Given Ativan IV and started on Keppra IV.

## 2015-06-26 DIAGNOSIS — F419 Anxiety disorder, unspecified: Secondary | ICD-10-CM

## 2015-06-26 LAB — GLUCOSE, CAPILLARY: GLUCOSE-CAPILLARY: 122 mg/dL — AB (ref 65–99)

## 2015-06-26 LAB — BASIC METABOLIC PANEL
ANION GAP: 8 (ref 5–15)
BUN: 23 mg/dL — ABNORMAL HIGH (ref 6–20)
CHLORIDE: 98 mmol/L — AB (ref 101–111)
CO2: 29 mmol/L (ref 22–32)
CREATININE: 0.82 mg/dL (ref 0.44–1.00)
Calcium: 8.7 mg/dL — ABNORMAL LOW (ref 8.9–10.3)
GFR calc non Af Amer: >60 mL/min (ref >60)
Glucose, Bld: 126 mg/dL — ABNORMAL HIGH (ref 65–99)
POTASSIUM: 4.3 mmol/L (ref 3.5–5.1)
SODIUM: 135 mmol/L (ref 135–145)

## 2015-06-26 LAB — CBC
HEMATOCRIT: 28.4 % — AB (ref 36.0–46.0)
HEMOGLOBIN: 8.9 g/dL — AB (ref 12.0–15.0)
MCH: 27.2 pg (ref 26.0–34.0)
MCHC: 31.3 g/dL (ref 30.0–36.0)
MCV: 86.9 fL (ref 78.0–100.0)
Platelets: 279 10*3/uL (ref 150–400)
RBC: 3.27 MIL/uL — AB (ref 3.87–5.11)
RDW: 17.2 % — ABNORMAL HIGH (ref 11.5–15.5)
WBC: 13 10*3/uL — ABNORMAL HIGH (ref 4.0–10.5)

## 2015-06-26 MED ORDER — POLYSACCHARIDE IRON COMPLEX 150 MG PO CAPS
150.0000 mg | ORAL_CAPSULE | Freq: Every day | ORAL | Status: DC
  Administered 2015-06-26 – 2015-06-29 (×4): 150 mg via ORAL
  Filled 2015-06-26 (×7): qty 1

## 2015-06-26 MED ORDER — BOOST / RESOURCE BREEZE PO LIQD
1.0000 | Freq: Three times a day (TID) | ORAL | Status: DC
  Administered 2015-06-26 – 2015-06-29 (×12): 1 via ORAL
  Filled 2015-06-26 (×3): qty 1

## 2015-06-26 MED ORDER — IPRATROPIUM-ALBUTEROL 0.5-2.5 (3) MG/3ML IN SOLN
3.0000 mL | Freq: Four times a day (QID) | RESPIRATORY_TRACT | Status: DC
  Administered 2015-06-26 – 2015-06-30 (×15): 3 mL via RESPIRATORY_TRACT
  Filled 2015-06-26 (×16): qty 3

## 2015-06-26 MED ORDER — SODIUM CHLORIDE 0.9 % IV SOLN
INTRAVENOUS | Status: DC | PRN
  Administered 2015-06-26: 1000 mL via INTRAVENOUS

## 2015-06-26 MED ORDER — ENOXAPARIN SODIUM 60 MG/0.6ML ~~LOC~~ SOLN
60.0000 mg | SUBCUTANEOUS | Status: DC
  Administered 2015-06-26 – 2015-06-29 (×4): 60 mg via SUBCUTANEOUS
  Filled 2015-06-26 (×4): qty 0.6

## 2015-06-26 NOTE — Progress Notes (Addendum)
Debra [name] was lying in bed and alert when I arrived. She said her AD was taken care of earlier and wanted me to call her mom re confirmation and answer any questions. Pt explained her journey of cancer and said that she will start radiation in the am. She wants to know what to expect. Will refer her to Chaplain in cancer ctr. Please page if spt is needed prior to that visit. Chaplain Debra [name]   06/26/15 1800  Clinical Encounter Type  Visited With Patient   Spoke w/pt's mother via phone. She was grateful for called and said AD was taken care of and she has it. She was appreciative of call and would like to meet [facility] during her visits during the daytime.

## 2015-06-26 NOTE — Progress Notes (Signed)
Patient changed to QID treatments. Patient called and requested 3 PRN breathing treatments during the day yesterday and has requested a PRN this afternoon. Patient was TID and was assessed by RT per protocol and changed to QID schedule. Patient has been clear and not appeared labored. She states they make her feel better. RT will continue to monitor patient. RN notified of changes and MD will be contacted if patient condition worsens.

## 2015-06-26 NOTE — Progress Notes (Signed)
Physical Therapy Treatment Patient Details Name: Debra [name] MRN: [id number] DOB: 03-Jul-1971 Today's Date: 06/26/2015    History of Present Illness 44 yo female with metastatic brain cancer, has hx of ovarian cancer, GERD, depression, asthma, chronic pain, respiratory failure, hypothyroidism, and morbid obesety     PT Comments    Pt is progressing with PT, no LOB this session but multi-modal cues and min/guard throughout for safety; limited amb d/t pt reporting her anxiety was incr and SOB d/t not having her inhaler; pt reports she does not want to go to a SNF for rehab and wants to go home with family.  Follow Up Recommendations  Supervision/Assistance - 24 hour;Home health PT     Equipment Recommendations  Wheelchair (measurements PT) (w/c for safety with longer distances, other DME TBA--?SPC)    Recommendations for Other Services       Precautions / Restrictions Precautions Precautions: Fall Restrictions Weight Bearing Restrictions: No    Mobility  Bed Mobility Overal bed mobility: Needs Assistance Bed Mobility: Sit to Supine       Sit to supine: Supervision   General bed mobility comments: incr time and cues for task completion  Transfers Overall transfer level: Needs assistance Equipment used: 1 person hand held assist Transfers: Sit to/from Stand Sit to Stand: Min guard;+2 safety/equipment         General transfer comment: +2 for safety, verbal cues to slow down, for awareness of lines  Ambulation/Gait Ambulation/Gait assistance: Min guard Ambulation Distance (Feet): 12 Feet (x2) Assistive device: None;1 person hand held assist Gait Pattern/deviations: Step-through pattern;Wide base of support     General Gait Details: min guard assist for safety, verbal cues for decr speed of movements, second person for IV pole d/t pt  recent falls   Stairs            Wheelchair Mobility    Modified Rankin (Stroke Patients Only)       Balance  Overall balance assessment: Needs assistance;History of Falls Sitting-balance support: No upper extremity supported;Feet supported Sitting balance-Leahy Scale: Good Sitting balance - Comments: good this session   Standing balance support: Single extremity supported;During functional activity;No upper extremity supported   Standing balance comment: min/guard for safety and awareness of lines, cues to slow speed of movements                    Cognition Arousal/Alertness: Awake/alert Behavior During Therapy: Anxious;Impulsive (anxiety per pt, requested meds) Overall Cognitive Status: Within Functional Limits for tasks assessed                 General Comments: pt moves quickly; cues for safety    Exercises      General Comments        Pertinent Vitals/Pain Pain Assessment: No/denies pain    Home Living                      Prior Function            PT Goals (current goals can now be found in the care plan section) Acute Rehab PT Goals Patient Stated Goal: to get treatment for cancer and get well PT Goal Formulation: With patient Time For Goal Achievement: 07/15/15 Potential to Achieve Goals: Good Progress towards PT goals: Progressing toward goals    Frequency  Min 3X/week    PT Plan Current plan remains appropriate;Discharge plan needs to be updated    Co-evaluation  End of Session Equipment Utilized During Treatment: Gait belt Activity Tolerance: Patient tolerated treatment well;Patient limited by fatigue (SOB d/t not having inhaler and anxiety per pt) Patient left: in bed;with call bell/phone within reach;with bed alarm set     Time: 7207-2182 PT Time Calculation (min) (ACUTE ONLY): 14 min  Charges:  $Gait Training: 8-22 mins                    G Codes:      Alfredo Collymore 07-06-15, 12:53 PM

## 2015-06-26 NOTE — Clinical Social Work Note (Signed)
CSW received consult for SNF placement on patient. Patient appears alert and oriented at time of visit. The patient states she does not plan on going to a facility and discharge and will return home with her mother at discharge. CSW notes that the patient is requesting assistance with advanced directive. CSW provided patient with documents to fill out. Spiritual care has been consulted for the completion of these documents. CSW signing off at this time.    Liz Beach MSW, [place], [place], [phone]

## 2015-06-26 NOTE — Progress Notes (Signed)
Daily Progress Note   Patient Name: Debra [name]       Date: 06/26/2015 DOB: [birth year]  Age: 44 y.o. MRN#: [id number] Attending Physician: Eugenie Filler, MD Primary Care Physician: Guadlupe Spanish, MD Admit Date: 06/23/2015  Reason for Consultation/Follow-up: Establishing goals of care and symptom management  Subjective:  patient resting in bed, in no distress.   Interval Events: [place] yesterday, ? Seizure activity. Anxiety well controlled, patient thinks the Ativan helps Pain well controlled Patient requesting chocolate boost Patient in good spirits about her whole brain radiation to commence on 06-27-15  Length of Stay: 3 days  Current Medications: Scheduled Meds:  . ALPRAZolam  0.5 mg Oral TID  . antiseptic oral rinse  7 mL Mouth Rinse q12n4p  . buPROPion  150 mg Oral BID  . chlorhexidine  15 mL Mouth Rinse BID  . citalopram  40 mg Oral q morning - 10a  . dexamethasone  10 mg Intravenous 4 times per day  . enoxaparin (LOVENOX) injection  50 mg Subcutaneous Q24H  . feeding supplement  1 Container Oral TID BM  . furosemide  40 mg Oral Daily  . ipratropium-albuterol  3 mL Nebulization TID  . iron polysaccharides  150 mg Oral Daily  . levETIRAcetam  500 mg Oral BID  . levothyroxine  175 mcg Oral QAC breakfast  . mometasone-formoterol  2 puff Inhalation BID  . montelukast  10 mg Oral QHS  . morphine  30 mg Oral BID  . pantoprazole  40 mg Oral BID AC    Continuous Infusions: . sodium chloride 1,000 mL (06/26/15 0115)  . sodium chloride irrigation      PRN Meds: sodium chloride, acetaminophen **OR** acetaminophen, albuterol, ALPRAZolam, benzonatate, dextromethorphan-guaiFENesin, gi cocktail, ipratropium-albuterol, ketorolac, LORazepam, oxyCODONE-acetaminophen,  prochlorperazine, promethazine, senna-docusate, sodium chloride, sorbitol, traZODone  Physical Exam: Physical Exam             NAD Clear S1S2 Bruise on lower back from fall Trace edema Non focal  Vital Signs: BP 121/71 mmHg  Pulse 79  Temp(Src) 97.7 F (36.5 C) (Axillary)  Resp 17  Ht 5\' 2"  (1.575 m)  Wt 119.5 kg (263 lb 7.2 oz)  BMI 48.17 kg/m2  SpO2 95% SpO2: SpO2: 95 % O2 Device: O2 Device: Not Delivered O2 Flow Rate: O2 Flow Rate (L/min): 2  L/min  Intake/output summary:  Intake/Output Summary (Last 24 hours) at 06/26/15 0910 Last data filed at 06/26/15 9767  Gross per 24 hour  Intake   1849 ml  Output   2850 ml  Net  -1001 ml   LBM:   Baseline Weight: Weight: 102.059 kg (225 lb) Most recent weight: Weight: 119.5 kg (263 lb 7.2 oz)       Palliative Assessment/Data: Flowsheet Rows        Most Recent Value   Intake Tab    Referral Department  Hospitalist   Unit at Time of Referral  Med/Surg Unit   Palliative Care Primary Diagnosis  Cancer   Date Notified  06/24/15   Palliative Care Type  New Palliative care   Reason for referral  Clarify Goals of Care   Date of Admission  06/23/15   Date first seen by Palliative Care  06/24/15   # of days Palliative referral response time  0 Day(s)   # of days IP prior to Palliative referral  1   Clinical Assessment    Palliative Performance Scale Score  40%   Pain Max last 24 hours  2   Pain Min Last 24 hours  1   Dyspnea Max Last 24 Hours  2   Dyspnea Min Last 24 hours  1   Nausea Max Last 24 Hours  3   Nausea Min Last 24 Hours  2   Anxiety Max Last 24 Hours  2   Anxiety Min Last 24 Hours  2   Other Max Last 24 Hours  0   Psychosocial & Spiritual Assessment    Social Work Plan of Care  Clarified patient/family wishes with healthcare team   Palliative Care Outcomes    Patient/Family meeting held?  Yes   Who was at the meeting?  Patient, mother, daughter   Palliative Care Outcomes  Clarified goals of care    Patient/Family wishes: Interventions discontinued/not started   Other (Comment)   Palliative Care follow-up planned  Yes, Facility      Additional Data Reviewed: Recent Labs     06/25/15  0445  06/26/15  0620  WBC  14.3*  13.0*  HGB  8.6*  8.9*  PLT  276  279  NA  136  135  BUN  16  23*  CREATININE  0.84  0.82     Problem List:  Patient Active Problem List   Diagnosis Date Noted  . 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 Swelling   . Encounter for palliative care   . Brain metastasis ([place]) 06/23/2015  . Acute encephalopathy 06/23/2015  . Ataxia 06/23/2015  . Chronic respiratory failure ([place]): 3L [place] 06/23/2015  . Metastasis to brain ([place]) 06/23/2015  . Morbid obesity with BMI of 40.0-44.9, adult ([place]) 05/27/2015  . On home oxygen therapy 05/27/2015  . Chronic bronchitis with acute exacerbation ([place]) 05/27/2015  . Ovarian cancer, bilateral ([place]) 10/24/2014  . Chronic pain due to injury 10/24/2014  . Portacath in place 10/24/2014  . Hx laparoscopic cholecystectomy 10/24/2014  . History of vaginal hysterectomy 10/24/2014  . Thyroid activity decreased 10/24/2014  . Asthma, moderate persistent 10/24/2014  . Obesity, morbid, BMI 40.0-49.9 ([place]) 10/24/2014  . Acute-on-chronic respiratory failure ([place]) 02/27/2014  . Hypokalemia 02/27/2014  . Recurrent pneumonia 12/11/2013  . Anemia 12/10/2013  . COPD exacerbation ([place]) 12/09/2013  . Anxiety state, unspecified 12/09/2013  . HAP (hospital-acquired pneumonia) 05/16/2013  . Hospital-acquired pneumonia 05/16/2013  .  COPD (chronic obstructive pulmonary disease) ([place]) 05/16/2013  . Hypoxia 05/16/2013  . Respiratory failure with hypercapnia ([place]) 04/02/2013  . Hypothyroidism 04/02/2013  . Leukocytosis 09/19/2012  . Normocytic anemia 09/19/2012  . Streptococcal pneumonia ([place]) 09/19/2012  . Status asthmaticus 09/18/2012  . HCAP (healthcare-associated pneumonia) 09/18/2012  . Acute  respiratory failure with hypoxia ([place]) 09/18/2012  . Anxiety   . Depression   . Thyroid disease   . GERD (gastroesophageal reflux disease)   . Insomnia   . Allergic rhinitis   . Arthritis      Palliative Care Assessment & Plan    1.Code Status:  Full code    Code Status Orders        Start     Ordered   06/23/15 1658  Full code   Continuous     06/23/15 1704       2. Goals of Care:   continue current treatment  Limitations on Scope of Treatment: Full Scope Treatment  Desire for further Chaplaincy support:yes  Psycho-social Needs: chaplain consult to complete HCPOA paperwork  3. Symptom Management:      1. As outlined above  4. Palliative Prophylaxis:   Bowel Regimen  5. Prognosis: Unable to determine  6. Discharge Planning:  likely home with mother   Care plan was discussed with  Patient   Thank you for allowing the Palliative Medicine Team to assist in the care of this patient.   Time In: 0830 Time Out: 0900 Total Time 30 Prolonged Time Billed  no        [phone] Loistine Chance, MD  06/26/2015, 9:10 AM  Please contact Palliative Medicine Team phone at [phone] for questions and concerns.

## 2015-06-26 NOTE — Progress Notes (Signed)
Pt continuously reminded not to get up by herself. Bed alarm on. Using [facility] with assist. Family at bedside visiting. Pt complained of headache behind [facility].

## 2015-06-26 NOTE — Progress Notes (Signed)
[facility] PROGRESS NOTE  Debra [name] BHA:[id number] DOB: 04/26/1971 DOA: 06/23/2015 PCP: Guadlupe Spanish, MD  Assessment/Plan: #1 brain metastases Patient noted on head CT scan to have some brain metastases. 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 Patient with some clinical improvement. MRI of the head which was done was consistent with probable brain metastases. Continue current dose of IV Decadron. Patient has been seen by oncology and patient will be referred to radiation oncology for further evaluation. Patient has been assessed by radiation oncology and underwent simulation yesterday 06/24/2015. Patient to start radiation treatments on Monday, 06/27/2015. Continue Keppra for seizure prophylaxis. Oncology following and appreciate input and recommendations.  #2 simple partial seizures Likely secondary to brain metastases. Patient noted yesterday morning to have some jerking motions consistent with seizure activity and slumped forward. She had no tongue biting or urinary incontinence. No significant post ictal state. Patient with no further seizure noted. Patient was loaded with 1 g IV Keppra. Continue Keppra 500 mg twice daily. Ativan as needed. No need for EEG at this time. Seizure precautions. Case discussed with neurology, Dr. Leonel Ramsay on 06/25/2015, who was in agreement with current plan.  #3 acute encephalopathy/ataxia Secondary to problem #1. Infectious workup negative to date. Continue IV Decadron. Patient has been seen by radiation oncology and patient underwent simulation yesterday 06/24/2015 and to start radiation treatments on Monday, 06/27/2015. Radiation oncology and oncology following. PT/OT.  #4 fall Patient noted to fall 2 days ago, while in the bathroom. Likely secondary to cerebellar metastatic disease. PT/OT. Patient to start radiation treatments on Monday, 06/27/2015.  #5 chronic  respiratory failure on 3 L nasal cannula Stable. Continue Spiriva and Advair. Nebulizer treatments as needed.  #6 gastroesophageal reflux disease PPI.  #7 depression/anxiety Continue Celexa and Wellbutrin. Xanax dose has been adjusted per palliative care.  #8 hypothyroidism Continue current dose of Synthroid.  #9 chronic pain syndrome since tractor accident in childhood Continue MS Contin twice daily. Percocet for breakthrough pain.  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 Per oncology chemotherapy to be held until radiation is completed. Per oncology.  #11 anemia Stable. Follow H&H. Transfusion threshold hemoglobin less than 7.  #12 full dental extraction Patient had dental instruction done on 06/14/2015. Sutures were removed 06/24/2015 per Dr. Enrique Sack.  #13 left lower extremity edema/lymphedema Dopplers done on 06/24/2015 negative for DVT.  14 diarrhea Patient had diarrhea 4 days prior to admission. Patient has had no diarrhea since admission and has had formed stool. Discontinued enteric precautions.  #15 leukocytosis Likely secondary to steroids. Follow.  #16 prophylaxis PPI for GI prophylaxis. Lovenox for DVT prophylaxis.  Code Status: Full Family Communication: Updated patient and mother and daughter at bedside. Disposition Plan: Pending further evaluation of metastatic brain disease.   Consultants:  Oncology: Dr. Marko Plume 06/23/2015  Palliative care: Dr. Rowe Pavy 06/24/2015  Radiation oncology: Dr. Tammi Klippel 06/24/2015  Procedures:  CT head 06/23/2015  MRI head 06/23/2015  Chest x-ray 06/23/2015  Left lower extremity Doppler 06/24/2015  Antibiotics:  None  HPI/Subjective: Patient states feeling better. No further seizures noted.  Objective: Filed Vitals:   06/26/15 0520  BP: 121/71  Pulse: 79  Temp: 97.7 F (36.5 C)   Resp: 17    Intake/Output Summary (Last 24 hours) at 06/26/15 1247 Last data filed at 06/26/15 1046  Gross per 24 hour  Intake 3240.5 ml  Output   3300 ml  Net  -59.5 ml   Filed Weights   06/23/15 1657 06/24/15 0354 06/26/15 0724  Weight: 102.059 kg (225 lb) 102.785 kg (226 lb 9.6 oz) 119.5 kg (263 lb 7.2 oz)    Exam:   General:  NAD. Bruises noted on left flank, left arm and left shin.  Cardiovascular: RRR  Respiratory: CTAB  Abdomen: Soft, nontender, nondistended, positive bowel sounds.  Musculoskeletal: No clubbing cyanosis. Left lower extremity lymphedema.   Data Reviewed: Basic Metabolic Panel:  Recent Labs Lab 06/23/15 1457 06/23/15 1950 06/24/15 0601 06/25/15 0445 06/26/15 0620  NA 134*  --  137 136 135  K 3.8  --  4.0 4.5 4.3  CL 98*  --  100* 101 98*  CO2 27  --  27 29 29   GLUCOSE 82  --  191* 144* 126*  BUN 11  --  12 16 23*  CREATININE 0.88  --  0.74 0.84 0.82  CALCIUM 8.7*  --  9.1 8.9 8.7*  MG  --  1.6*  --  2.1  --    Liver Function Tests:  Recent Labs Lab 06/23/15 1457 06/24/15 0601  AST 15 19  ALT 12* 13*  ALKPHOS 54 54  BILITOT 0.3 0.3  PROT 6.7 6.7  ALBUMIN 3.4* 3.3*   No results for input(s): LIPASE, AMYLASE in the last 168 hours. No results for input(s): AMMONIA in the last 168 hours. CBC:  Recent Labs Lab 06/23/15 1335 06/24/15 0601 06/25/15 0445 06/26/15 0620  WBC 7.6 6.2 14.3* 13.0*  NEUTROABS 4.5  --   --   --   HGB 9.6* 9.4* 8.6* 8.9*  HCT 31.2* 30.5* 28.1* 28.4*  MCV 87.2 87.9 85.4 86.9  PLT 277 285 276 279   Cardiac Enzymes:  Recent Labs Lab 06/23/15 1457  TROPONINI <0.03   BNP (last 3 results) No results for input(s): BNP in the last 8760 hours.  ProBNP (last 3 results) No results for input(s): PROBNP in the last 8760 hours.  CBG:  Recent Labs Lab 06/25/15 0732 06/26/15 0729  GLUCAP 130* 122*    Recent Results (from the past 240 hour(s))  Culture, Urine     Status: None   Collection  Time: 06/23/15  4:43 PM  Result Value Ref Range Status   Specimen Description URINE, CLEAN CATCH  Final   Special Requests NONE  Final   Culture   Final    MULTIPLE SPECIES PRESENT, SUGGEST RECOLLECTION Performed at [facility]    Report Status 06/25/2015 FINAL  Final  Culture, blood (routine x 2)     Status: None (Preliminary result)   Collection Time: 06/23/15  7:50 PM  Result Value Ref Range Status   Specimen Description BLOOD RIGHT HAND  Final   Special Requests BOTTLES DRAWN AEROBIC AND ANAEROBIC  5CC  Final   Culture   Final    NO GROWTH 3 DAYS Performed at [facility]    Report Status PENDING  Incomplete  Culture, blood (routine x 2)     Status: None (Preliminary result)   Collection Time: 06/23/15  8:00 PM  Result Value Ref Range Status   Specimen Description BLOOD LEFT HAND  Final   Special Requests BOTTLES DRAWN AEROBIC AND ANAEROBIC  10CC  Final   Culture   Final    NO GROWTH 3 DAYS Performed at [facility]    Report Status PENDING  Incomplete     Studies: No results found.  Scheduled Meds: . ALPRAZolam  0.5 mg Oral TID  . antiseptic oral rinse  7 mL Mouth Rinse q12n4p  . buPROPion  150 mg Oral BID  . chlorhexidine  15 mL Mouth Rinse BID  . citalopram  40 mg Oral q morning - 10a  . dexamethasone  10 mg Intravenous 4 times per day  . enoxaparin (LOVENOX) injection  60 mg Subcutaneous Q24H  . feeding supplement  1 Container Oral TID BM  . furosemide  40 mg Oral Daily  . ipratropium-albuterol  3 mL Nebulization TID  . iron polysaccharides  150 mg Oral Daily  . levETIRAcetam  500 mg Oral BID  . levothyroxine  175 mcg Oral QAC breakfast  . mometasone-formoterol  2 puff Inhalation BID  . montelukast  10 mg Oral QHS  . morphine  30 mg Oral BID  . pantoprazole  40 mg Oral BID AC   Continuous Infusions: . sodium chloride 1,000 mL (06/26/15 0115)  . 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 [facility] Pager [phone]. If 7PM-7AM, please contact night-coverage at [email or web address], password [facility] 06/26/2015, 12:47 PM  LOS: 3 days

## 2015-06-27 ENCOUNTER — Encounter: Payer: Self-pay | Admitting: General Practice

## 2015-06-27 ENCOUNTER — Ambulatory Visit
Admit: 2015-06-27 | Discharge: 2015-06-27 | Disposition: A | Discharge status: Home / Self Care | Payer: Medicaid Other | Attending: Radiation Oncology | Admitting: Radiation Oncology

## 2015-06-27 DIAGNOSIS — G40109 Localization-related (focal) (partial) symptomatic epilepsy and epileptic syndromes with simple partial seizures, not intractable, without status epilepticus: Secondary | ICD-10-CM

## 2015-06-27 DIAGNOSIS — F4323 Adjustment disorder with mixed anxiety and depressed mood: Secondary | ICD-10-CM

## 2015-06-27 LAB — COMPREHENSIVE METABOLIC PANEL
ALBUMIN: 3 g/dL — AB (ref 3.5–5.0)
ALK PHOS: 44 U/L (ref 38–126)
ALT: 11 U/L — AB (ref 14–54)
AST: 14 U/L — AB (ref 15–41)
Anion gap: 7 (ref 5–15)
BILIRUBIN TOTAL: 0.4 mg/dL (ref 0.3–1.2)
BUN: 28 mg/dL — AB (ref 6–20)
CO2: 28 mmol/L (ref 22–32)
CREATININE: 0.88 mg/dL (ref 0.44–1.00)
Calcium: 8.7 mg/dL — ABNORMAL LOW (ref 8.9–10.3)
Chloride: 99 mmol/L — ABNORMAL LOW (ref 101–111)
GFR calc Af Amer: >60 mL/min (ref >60)
GLUCOSE: 129 mg/dL — AB (ref 65–99)
Potassium: 4.1 mmol/L (ref 3.5–5.1)
Sodium: 134 mmol/L — ABNORMAL LOW (ref 135–145)
TOTAL PROTEIN: 5.9 g/dL — AB (ref 6.5–8.1)

## 2015-06-27 LAB — CBC
HEMATOCRIT: 28.8 % — AB (ref 36.0–46.0)
Hemoglobin: 9.1 g/dL — ABNORMAL LOW (ref 12.0–15.0)
MCH: 27.1 pg (ref 26.0–34.0)
MCHC: 31.6 g/dL (ref 30.0–36.0)
MCV: 85.7 fL (ref 78.0–100.0)
PLATELETS: 291 10*3/uL (ref 150–400)
RBC: 3.36 MIL/uL — ABNORMAL LOW (ref 3.87–5.11)
RDW: 17.4 % — AB (ref 11.5–15.5)
WBC: 12 10*3/uL — AB (ref 4.0–10.5)

## 2015-06-27 LAB — GLUCOSE, CAPILLARY: Glucose-Capillary: 126 mg/dL — ABNORMAL HIGH (ref 65–99)

## 2015-06-27 MED ORDER — LORAZEPAM 0.5 MG PO TABS
0.5000 mg | ORAL_TABLET | Freq: Three times a day (TID) | ORAL | Status: DC
Start: 2015-06-27 — End: 2015-06-30
  Administered 2015-06-27 (×2): 0.5 mg via ORAL
  Administered 2015-06-27: 1 mg via ORAL
  Administered 2015-06-28 – 2015-06-30 (×7): 0.5 mg via ORAL
  Filled 2015-06-27 (×11): qty 1

## 2015-06-27 MED ORDER — LORAZEPAM 1 MG PO TABS
1.0000 mg | ORAL_TABLET | ORAL | Status: DC
  Filled 2015-06-27: qty 1

## 2015-06-27 MED ORDER — DEXAMETHASONE 4 MG PO TABS
8.0000 mg | ORAL_TABLET | Freq: Four times a day (QID) | ORAL | Status: DC
  Administered 2015-06-28 – 2015-06-29 (×7): 8 mg via ORAL
  Filled 2015-06-27 (×7): qty 2

## 2015-06-27 MED ORDER — DEXAMETHASONE 4 MG PO TABS
10.0000 mg | ORAL_TABLET | Freq: Four times a day (QID) | ORAL | Status: DC
  Administered 2015-06-27: 8 mg via ORAL
  Administered 2015-06-27: 10 mg via ORAL
  Filled 2015-06-27 (×2): qty 3

## 2015-06-27 MED ORDER — LORAZEPAM 1 MG PO TABS
1.0000 mg | ORAL_TABLET | ORAL | Status: DC
  Administered 2015-06-29 – 2015-06-30 (×2): 1 mg via ORAL
  Filled 2015-06-27 (×2): qty 1

## 2015-06-27 NOTE — Progress Notes (Signed)
Phoned floor and spoke with patient's nurse, Jenny Reichmann. Explained 1 mg of sublingual ativan was given because the patient was unable to lay still on the treatment table. Understanding the patient has Ativan 0.5 scheduled requested tomorrow's dose be given closer to 1330 treatment time. Also, reported to [facility] that the patient is complaining of her "throat burning." Questioned if the patient is suffering from reflux related to decadron. Questioned if patient could receive GI cocktail once she returns to the floor.

## 2015-06-27 NOTE — Progress Notes (Signed)
MEDICAL ONCOLOGY June 27, 2015, 6:17 PM  Hospital day 5 Antibiotics: none Chemotherapy: not resumed as yet  EMR reviewed, patient seen with unit RN present. Family here thru most of day, but have gone home now.  Subjective: Anxiety exacerbated by steroids. Needed additional ativan to tolerate RT today and I have increased premed ativan now. No further falls. Breathing ok, minimally productive now. Last bowel movement 06-26-15, not diarrhea. Esophagitis symptoms earlier today improved now. No frank aspiration but at risk + recent full dental extractions such that diet adjusted. No bleeding. LLE swelling no worse.  Objective: Vital signs in last 24 hours: Blood pressure 121/72, pulse 80, temperature 98 F (36.7 C), temperature source Oral, resp. rate 18, height 5\' 2"  (1.575 m), weight 264 lb 8.8 oz (120 kg), SpO2 96 %. Awake, alert, speech fluent and appropriate, cooperative. A little tremulous as she has been. Oral mucosa moist and clear, posterior pharynx also. PERRL, not icteric. No JVD. PAC accessed and locked, site unremarkable. Lungs clear anteriorly and laterally, diminished BS thruout as baseline. Heart RRR. Abdomen soft, quiet, not tender. LE trace pedal edema right and 1-2+ not tight on left to thigh. Feet warm. Moves all extremities in bed.   Intake/Output from previous day: 11/06 0701 - 11/07 0700 In: 3769 [[name].O.:3621; I.V.:148] Out: 3200 [Urine:3200] Intake/Output this shift: Total I/O In: 1478.2 [[name].O.:1320; I.V.:158.2] Out: 2100 [Urine:2100]   Lab Results:  Recent Labs  06/26/15 0620 06/27/15 0542  WBC 13.0* 12.0*  HGB 8.9* 9.1*  HCT 28.4* 28.8*  PLT 279 291   BMET  Recent Labs  06/26/15 0620 06/27/15 0542  NA 135 134*  K 4.3 4.1  CL 98* 99*  CO2 29 28  GLUCOSE 126* 129*  BUN 23* 28*  CREATININE 0.82 0.88  CALCIUM 8.7* 8.7*    Studies/Results: No results found.   Assessment/Plan: 1.multiple brain metastases from high grade serous/ clear cell  ovarian cancer recently progressive in pelvis. On oral decadron, whole brain RT begun 06-27-15. I have decreased decadron slightly, to 8 mg every 6 hrs. Will premedicate RT with ativan 1 mg daily. Continuing keppra.Has had 2 falls in hospital, including with PT; fall precautions including fall mat beside bed.  Palliative care consult done. Code status still listed as full code; patient has expressed to this MD that she would not want to be on life support in irreversible situation.  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 Recurrent disease by CT AP 05-19-15. Hold chemo at least until radiation completed, then consider single agent carboplatin in palliative attempt depending on performance status and overall situation. See discussion #1 also. She appreciates support from oncology chaplain 3.Severe chronic lung disease with recurrent infections and previous ventilator support after cycle 2 adjuvant chemotherapy. Frequent antibiotics and steroids outpatient. [facility] at [facility] 2012  4. No diarrhea since admission 5.PAC in  6.chronic pain since tractor accident in childhood: outpatient pain meds by PCP with assistance of mother.  7.full dental extractions 06-14-15, sutures removed 06-24-15 8.family history breast and ovarian cancer in mother. Note testing could also be done on mother as index. 9.flu vaccine done 04-21-15 10.Patient wants to complete Advance Directives. Per conversation with this MD after admission, with mother and daughter present,  patient does not want to be maintained on life support in irreversible situation. Palliative Care saw 11-4. 11.morbid obesity, GERD, lap cholecystectomy, vaginal hysterectomy for  cervical indications, hypothyroid. 12.swelling LLE: no DVT by doppler 06-24-15.  13. History of past incarceration for drugs, now  on probation.   Please page me between my rounds if needed [phone]. Thank you   Debra [name]

## 2015-06-27 NOTE — Progress Notes (Signed)
Spiritual Care Note  Referred by weekend chaplain Ree Edman for f/u support.  Josepha was very welcoming of chaplain presence, verbalizing gratitude several times for visit and for prayer shawl, a tangible sign of support and encouragement.  Her tone was very matter-of-fact as she shared about learning of brain mets, even while acknowledging, "This is not good."  She lives with mom and dtr, reporting good support from mom, who is a 14-year ovarian/breast survivor.  Pt takes great comfort in seeing her mom's strength and resilience.  Per pt, she is most concerned about her dtr and mom re stress of her own prognosis.  She praised palliative care and hospice highly for their support during her dad's decline and death from Parkinson's last year.    Provided pastoral presence, reflective listening, normalization of feelings.  We plan for me to f/u tomorrow for further support.  Plan to provide opportunity for pt to explore her feelings (hopes, fears, etc) in more detail.  One of her goals is for me to meet her mom (as supporter and role model in cancer coping).  Please also page as needs arise.  Thank you.  [place], [place], [place] Pager [phone] Voicemail [phone]

## 2015-06-27 NOTE — Progress Notes (Signed)
   06/27/15 1500  Clinical Encounter Type  Visited With Patient  Visit Type Follow-up;Spiritual support;Social support  Referral From Chaplain  Consult/Referral To Chaplain  Spiritual Encounters  Spiritual Needs Emotional;Prayer (calming techniques)  Stress Factors  Patient Stress Factors Health changes (anxious over new diagnosis)  [place] visited with pt providing emotional and spiritual support; [place] discussed pt diagnosis and pt anxiety associated with it and her personal situation; pt has support system from mother and daughter; [place] worked on safe space and breathing focus to help reduce anxiety and stress; pt was receptive and would like follow-up visit either before or after radiation. Gwynn Burly 3:44 PM

## 2015-06-27 NOTE — Progress Notes (Signed)
[facility] PROGRESS NOTE  Debra [name] ZOX:[id number] DOB: [birth year] DOA: 06/23/2015 PCP: Guadlupe Spanish, MD  Assessment/Plan: #1 brain metastases Patient noted on head CT scan to have some brain metastases. 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 Patient with some clinical improvement. MRI of the head which was done was consistent with probable brain metastases. Change IV Decadron to oral decadron. Patient has been seen by oncology and patient will be referred to radiation oncology for further evaluation. Patient has been assessed by radiation oncology and underwent simulation 06/24/2015. Patient to start radiation treatments today, Monday, 06/27/2015. Continue Keppra for seizure prophylaxis. Oncology following and appreciate input and recommendations.  #2 simple partial seizures Likely secondary to brain metastases. Patient noted on morning of 06/25/2015, to have some jerking motions consistent with seizure activity and slumped forward. She had no tongue biting or urinary incontinence. No significant post ictal state. Patient with no further seizure noted. Patient was loaded with 1 g IV Keppra. Continue Keppra 500 mg twice daily. Ativan as needed. No need for EEG at this time. Seizure precautions. Case discussed with neurology, Dr. Leonel Ramsay on 06/25/2015, who was in agreement with current plan.  #3 acute encephalopathy/ataxia Secondary to problem #1. Infectious workup negative to date. Patient has been seen by radiation oncology and patient underwent simulation 06/24/2015 and to start radiation treatments today Monday, 06/27/2015. Change IV Decadron to oral decadron. Radiation oncology and oncology following. PT/OT.  #4 fall Patient noted to fall 3 days ago, while in the bathroom. Likely secondary to cerebellar metastatic disease. PT/OT. Patient to start radiation treatments today, Monday, 06/27/2015.  #5  chronic respiratory failure on 3 L nasal cannula Stable. Continue Spiriva and Advair. Nebulizer treatments as needed.  #6 gastroesophageal reflux disease PPI.  #7 depression/anxiety Continue Celexa and Wellbutrin. Xanax dose has been adjusted per palliative care. Change xanax to ativan as patient feels ativan works better.  #8 hypothyroidism Continue current dose of Synthroid.  #9 chronic pain syndrome since tractor accident in childhood Continue MS Contin twice daily. Percocet for breakthrough pain.  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 Per oncology chemotherapy to be held until radiation is completed. Per oncology.  #11 anemia Stable. Follow H&H. Transfusion threshold hemoglobin less than 7.  #12 full dental extraction Patient had dental extraction done on 06/14/2015. Sutures were removed 06/24/2015 per Dr. Enrique Sack.  #13 left lower extremity edema/lymphedema Dopplers done on 06/24/2015 negative for DVT.  14 diarrhea Patient had diarrhea 4 days prior to admission. Patient has had no diarrhea since admission and has had formed stool. Discontinued enteric precautions.  #15 leukocytosis Likely secondary to steroids. Follow.  #16 prophylaxis PPI for GI prophylaxis. Lovenox for DVT prophylaxis.  Code Status: Full Family Communication: Updated patient and mother and daughter at bedside. Disposition Plan: Pending further evaluation of metastatic brain disease.   Consultants:  Oncology: Dr. Marko Plume 06/23/2015  Palliative care: Dr. Rowe Pavy 06/24/2015  Radiation oncology: Dr. Tammi Klippel 06/24/2015  Procedures:  CT head 06/23/2015  MRI head 06/23/2015  Chest x-ray 06/23/2015  Left lower extremity Doppler 06/24/2015  Antibiotics:  None  HPI/Subjective: Patient c/o HA which is improving since admission. No seizures. Patient states choked  on food last night. Patient states ativan working better than xanax.  Objective: Filed Vitals:   06/27/15 0500  BP: 119/74  Pulse: 83  Temp: 97.7 F (36.5 C)  Resp: 16    Intake/Output Summary (Last 24 hours) at 06/27/15 0933 Last data filed at 06/27/15 0919  Gross per 24 hour  Intake 4127.17 ml  Output   3750 ml  Net 377.17 ml   Filed Weights   06/24/15 0354 06/26/15 0724 06/27/15 0500  Weight: 102.785 kg (226 lb 9.6 oz) 119.5 kg (263 lb 7.2 oz) 120 kg (264 lb 8.8 oz)    Exam:   General:  NAD. Bruises noted on left flank, left arm and left shin.  Cardiovascular: RRR  Respiratory: CTAB  Abdomen: Soft, nontender, nondistended, positive bowel sounds.  Musculoskeletal: No clubbing cyanosis. Left lower extremity lymphedema.   Data Reviewed: Basic Metabolic Panel:  Recent Labs Lab 06/23/15 1457 06/23/15 1950 06/24/15 0601 06/25/15 0445 06/26/15 0620 06/27/15 0542  NA 134*  --  137 136 135 134*  K 3.8  --  4.0 4.5 4.3 4.1  CL 98*  --  100* 101 98* 99*  CO2 27  --  27 29 29 28   GLUCOSE 82  --  191* 144* 126* 129*  BUN 11  --  12 16 23* 28*  CREATININE 0.88  --  0.74 0.84 0.82 0.88  CALCIUM 8.7*  --  9.1 8.9 8.7* 8.7*  MG  --  1.6*  --  2.1  --   --    Liver Function Tests:  Recent Labs Lab 06/23/15 1457 06/24/15 0601 06/27/15 0542  AST 15 19 14*  ALT 12* 13* 11*  ALKPHOS 54 54 44  BILITOT 0.3 0.3 0.4  PROT 6.7 6.7 5.9*  ALBUMIN 3.4* 3.3* 3.0*   No results for input(s): LIPASE, AMYLASE in the last 168 hours. No results for input(s): AMMONIA in the last 168 hours. CBC:  Recent Labs Lab 06/23/15 1335 06/24/15 0601 06/25/15 0445 06/26/15 0620 06/27/15 0542  WBC 7.6 6.2 14.3* 13.0* 12.0*  NEUTROABS 4.5  --   --   --   --   HGB 9.6* 9.4* 8.6* 8.9* 9.1*  HCT 31.2* 30.5* 28.1* 28.4* 28.8*  MCV 87.2 87.9 85.4 86.9 85.7  PLT 277 285 276 279 291   Cardiac Enzymes:  Recent Labs Lab 06/23/15 1457  TROPONINI <0.03   BNP (last 3 results) No  results for input(s): BNP in the last 8760 hours.  ProBNP (last 3 results) No results for input(s): PROBNP in the last 8760 hours.  CBG:  Recent Labs Lab 06/25/15 0732 06/26/15 0729  GLUCAP 130* 122*    Recent Results (from the past 240 hour(s))  Culture, Urine     Status: None   Collection Time: 06/23/15  4:43 PM  Result Value Ref Range Status   Specimen Description URINE, CLEAN CATCH  Final   Special Requests NONE  Final   Culture   Final    MULTIPLE SPECIES PRESENT, SUGGEST RECOLLECTION Performed at [facility]    Report Status 06/25/2015 FINAL  Final  Culture, blood (routine x 2)     Status: None (Preliminary result)   Collection Time: 06/23/15  7:50 PM  Result Value Ref Range Status   Specimen Description BLOOD RIGHT HAND  Final   Special Requests BOTTLES DRAWN AEROBIC AND ANAEROBIC  5CC  Final   Culture   Final    NO GROWTH 3 DAYS Performed at [facility]    Report Status PENDING  Incomplete  Culture, blood (routine x 2)     Status: None (Preliminary result)   Collection Time: 06/23/15  8:00 PM  Result  Value Ref Range Status   Specimen Description BLOOD LEFT HAND  Final   Special Requests BOTTLES DRAWN AEROBIC AND ANAEROBIC  10CC  Final   Culture   Final    NO GROWTH 3 DAYS Performed at [facility]    Report Status PENDING  Incomplete     Studies: No results found.  Scheduled Meds: . antiseptic oral rinse  7 mL Mouth Rinse q12n4p  . buPROPion  150 mg Oral BID  . chlorhexidine  15 mL Mouth Rinse BID  . citalopram  40 mg Oral q morning - 10a  . dexamethasone  10 mg Intravenous 4 times per day  . enoxaparin (LOVENOX) injection  60 mg Subcutaneous Q24H  . feeding supplement  1 Container Oral TID BM  . furosemide  40 mg Oral Daily  . ipratropium-albuterol  3 mL Nebulization QID  . iron polysaccharides  150 mg Oral Daily  . levETIRAcetam  500 mg Oral BID  . levothyroxine  175 mcg Oral QAC breakfast  . LORazepam  0.5 mg Oral TID   . mometasone-formoterol  2 puff Inhalation BID  . montelukast  10 mg Oral QHS  . morphine  30 mg Oral BID  . pantoprazole  40 mg Oral BID AC   Continuous Infusions: . sodium chloride irrigation      Principal Problem:   Brain metastasis (HCC) Active Problems:   Seizure disorder, simple partial ([place]): Likely secondary to brain mets   Anxiety   Depression   Thyroid disease   GERD (gastroesophageal reflux disease)   Leukocytosis   Normocytic anemia   COPD (chronic obstructive pulmonary disease) (HCC)   Anemia   Ovarian cancer, bilateral (HCC)   Chronic pain due to injury   Asthma, moderate persistent   Obesity, morbid, BMI 40.0-49.9 (HCC)   On home oxygen therapy   Acute encephalopathy   Ataxia   Chronic respiratory failure ([place]): 3L [place]   Metastasis [facility])   Encounter for palliative care   Swelling    Time spent: 42 minutes    Million Maharaj M.D. [facility] Pager [phone]. If 7PM-7AM, please contact night-coverage at [email or web address], password [facility] 06/27/2015, 9:33 AM  LOS: 4 days

## 2015-06-27 NOTE — Progress Notes (Signed)
Physical Therapy Treatment Patient Details Name: Debra [name] MRN: [id number] DOB: October 20, 1970 Today's Date: 06/27/2015    History of Present Illness 44 yo female with metastatic brain cancer, has hx of ovarian cancer, GERD, depression, asthma, chronic pain, respiratory failure, hypothyroidism, and morbid obesety     PT Comments    Pt. Currently on seizure and fall precautions; pt. Still impulsive with mobility; requires supervision for bed mobility and transfers STS and SPT for safety; pt. Ambulated 20 ft in hallway with straight cane, RA, and with min guard assist from student PTA requiring 3 seated rest breaks due to 3/4 DOE and c/o BIL knee pain and weakness (pt. Stated she has had 4 knee surgeries in hx); ambulation with cane did not prove to be safe/stable for pt.'s home use and suggest attempting RW next treat. Ambulated on RA with o2 sats staying above 92%.   Follow Up Recommendations  Supervision/Assistance - 24 hour;Home health PT     Equipment Recommendations  Rolling walker with 5" wheels;Wheelchair (measurements PT)    Recommendations for Other Services       Precautions / Restrictions      Mobility  Bed Mobility Overal bed mobility: Needs Assistance (for safety ) Bed Mobility: Supine to Sit;Sit to Supine   Sidelying to sit: Supervision;Min guard Supine to sit: Min guard;Supervision Sit to supine: Supervision;Min guard   General bed mobility comments: supervision/min guard for safety  Transfers Overall transfer level: Needs assistance Equipment used: 1 person hand held assist;Straight cane Transfers: Sit to/from [facility] Sit to Stand: Min guard;Supervision Stand pivot transfers: Min guard;Supervision          Ambulation/Gait Ambulation/Gait assistance: Min guard;Supervision Ambulation Distance (Feet): 20 Feet Assistive device: 1 person hand held assist;Straight cane Gait Pattern/deviations: Wide base of support;Step-through  pattern Gait velocity: decreased   General Gait Details: pt. ambulated 20' requiring 3 seated rest breaks with 3/4 DOE and c/o knee pain and weakness; +2 for guarding pt and follow with recliner; 1 left lateral LOB; pt. ambulated on RA with 02 sats staying above 92%; pt. ambulated with straight cane which did not prove to be safe/stable for patient home use   Stairs            Wheelchair Mobility    Modified Rankin (Stroke Patients Only)       Balance                                    Cognition Arousal/Alertness: Awake/alert Behavior During Therapy: WFL for tasks assessed/performed;Anxious;Impulsive Overall Cognitive Status: Within Functional Limits for tasks assessed Area of Impairment: Safety/judgement         Safety/Judgement: Decreased awareness of safety     General Comments: pt moves quickly; cues for safety    Exercises      General Comments        Pertinent Vitals/Pain Pain Assessment: No/denies pain    Home Living                      Prior Function            PT Goals (current goals can now be found in the care plan section) Acute Rehab PT Goals Time For Goal Achievement: 07/15/15 Potential to Achieve Goals: Good Progress towards PT goals: Progressing toward goals    Frequency  Min 3X/week    PT Plan Current plan remains appropriate;Discharge  plan needs to be updated    Co-evaluation             End of Session Equipment Utilized During Treatment: Gait belt Activity Tolerance: Patient tolerated treatment well;Patient limited by fatigue;Patient limited by pain Patient left: in chair;with call bell/phone within reach;with chair alarm set;with family/visitor present     Time: 1055-1120 PT Time Calculation (min) (ACUTE ONLY): 25 min  Charges:  $Gait Training: 8-22 mins $Therapeutic Activity: 8-22 mins                    G CodesDenna Haggard, SPTA   06/27/2015 1:13 PM   Pager:  [phone]   Reviewed and agree with above Rica Koyanagi  PTA [facility]  Acute  Rehab Pager      [phone]

## 2015-06-27 NOTE — Evaluation (Signed)
Clinical/Bedside Swallow Evaluation Patient Details  Name: Debra [name] MRN: [id number] Date of Birth: [birth year]  Today's Date: 06/27/2015 Time: SLP Start Time (ACUTE ONLY): 3 SLP Stop Time (ACUTE ONLY): 1426 SLP Time Calculation (min) (ACUTE ONLY): 24 min  Past Medical History:  Past Medical History  Diagnosis Date  . Asthma   . Ulcerative colitis   . Anxiety   . Depression   . Chronic back pain   . Chronic knee pain   . Thyroid disease     hypo  . GERD (gastroesophageal reflux disease)   . Insomnia   . Allergic rhinitis   . Arthritis   . COPD (chronic obstructive pulmonary disease) ([place])   . Mouth pain     jaw and mouth pain 06-13-15  . Cancer [facility])     Ovarian cancer 10'15- Dr. Curlene Dolphin follows. Chemo last 1'16, additional planned .  Marland Kitchen Chronic respiratory failure ([place]): 3L [place] 06/23/2015   Past Surgical History:  Past Surgical History  Procedure Laterality Date  . Knee surgery      x3, two on left and one on right.  Arthoscopy  . Cholecystectomy    . Abdominal hysterectomy    . Cesarean section    . Tonsillectomy    . Joint replacement      2x on left, 1x on right  . Portacath placement Right     06-13-15 remains inplace  . Multiple extractions with alveoloplasty N/A 06/14/2015    Procedure: Extraction of tooth #'s 2, 4-14, 20-29 with alveoloplasty;  Surgeon: Lenn Cal, DDS;  Location: [facility] ORS;  Service: Oral Surgery;  Laterality: N/A;   HPI:  44 yo female adm to [facility] with vomiting/nausea.  Pt has recurrence of IIIC bilateral ovarian cancer with brain mets, s/p recent dental extraction for multiple caries.  Pt currently undergoing XRT.  PMH + for gastro esophageal reflux disease.  Swallow evaluation ordered.  Pt hospital coarse complicated by fall, ? seizure.  Pt also coughing/choking on hamburger - admits she was short of breath. Pt  isolates primary dysphagia symptoms to throat - stating food sticks in throat.  Swallow evaluation reordered.      Assessment / Plan / Recommendation Clinical Impression  Pt without overt s/s of aspiration with po observed including peaches, soda and cottage cheese.  Swallow was overall timely with clear voice throughout.  Cough x2 noted during intake which is also occuring without po due to pt's h/o asthma per pt/family.  Pt is impulsive and takes very large bites/sips which will increase her aspiration risk significantly and she will benefit from full supervision.  Pt states that she choked on hamburger after sensing it lodged in the throat after swallowing.  Pt also admits that she was eating when she was short of breath prior to hamburger consumption.  Mother in room reports she was cutting pt's food up finely prior to admit - due to pt's recent dental extraction.   Frequent belching noted during intake which pt reports is baseline.  Pt reports she has GERD and takes a PPI BID.  Her GERD *per mother* is worse since her medical diagnosis.  Family reports pt consumes soda "all day long".  Will modify diet to dys2/thin and will follow closely for tolerance - pt and family agreeable to plan.  Suspect her impulsivity coupled with lack of dentition contributing factors for aspirationr risk..  Reviewed aspiration precautions with pt/family.      Aspiration Risk  Mild    Diet Recommendation  Dysphagia 2 (Fine chop);Thin   Medication Administration: Whole meds with liquid Compensations: Small sips/bites;Slow rate;Drink liquid t/o meal   Other  Recommendations Oral Care Recommendations: Oral care BID   Follow Up Recommendations     TBD  Frequency and Duration min 2x/week  2 weeks   Pertinent Vitals/Pain Afebrile, decreased      Swallow Study Prior Functional Status   see hhx    General Date of Onset: 06/24/15 Other Pertinent Information: 44 yo female adm to [facility] with vomiting/nausea.  Pt has recurrence of IIIC bilateral ovarian cancer with brain mets, s/p recent dental extraction for multiple caries.  Pt  currently undergoing XRT.  PMH + for gastro esophageal reflux disease.  Swallow evaluation ordered.  Pt hospital coarse complicated by fall, ? seizure.  Pt also coughing/choking on hamburger - admits she was short of breath. Pt  isolates primary dysphagia symptoms to throat - stating food sticks in throat.  Swallow evaluation reordered.   Type of Study: Bedside swallow evaluation Diet Prior to this Study: Dysphagia 3 (soft);Thin liquids Respiratory Status: Room air History of Recent Intubation: No Behavior/Cognition: Alert;Cooperative;Pleasant mood Oral Cavity - Dentition: Edentulous Self-Feeding Abilities: Able to feed self Patient Positioning: Upright in bed Baseline Vocal Quality: Normal Volitional Cough: Strong Volitional Swallow: Able to elicit    Oral/Motor/Sensory Function Overall Oral Motor/Sensory Function: Appears within functional limits for tasks assessed   Ice Chips Ice chips: Not tested   Thin Liquid Thin Liquid: Within functional limits Presentation: Cup;Straw    Nectar Thick Nectar Thick Liquid: Not tested   Honey Thick Honey Thick Liquid: Not tested   Puree Puree: Not tested   Solid   GO    Solid: Impaired Presentation: Spoon;Self Fed Oral Phase Impairments: Impaired anterior to posterior transit;Impaired mastication;Reduced lingual movement/coordination Oral Phase Functional Implications:  (rapid rate of intake and large boluses!  )       [name], [facility] SLP [phone]

## 2015-06-27 NOTE — Progress Notes (Signed)
Administered 1 mg Ativan po as ordered by Dr. Tammi Klippel. Patient anxious and unable to lay still for radiation treatment. Will encouraged therapist to contact floor nurse prior to transfer so that ativan can be given prior to transport thus, preventing a delay in treatment.

## 2015-06-27 NOTE — Progress Notes (Signed)
Family at bedside. 

## 2015-06-28 ENCOUNTER — Ambulatory Visit
Admit: 2015-06-28 | Discharge: 2015-06-28 | Disposition: A | Discharge status: Home / Self Care | Payer: Medicaid Other | Attending: Radiation Oncology | Admitting: Radiation Oncology

## 2015-06-28 DIAGNOSIS — E440 Moderate protein-calorie malnutrition: Secondary | ICD-10-CM | POA: Insufficient documentation

## 2015-06-28 LAB — CBC
HCT: 30 % — ABNORMAL LOW (ref 36.0–46.0)
HEMOGLOBIN: 9.4 g/dL — AB (ref 12.0–15.0)
MCH: 26.7 pg (ref 26.0–34.0)
MCHC: 31.3 g/dL (ref 30.0–36.0)
MCV: 85.2 fL (ref 78.0–100.0)
PLATELETS: 290 10*3/uL (ref 150–400)
RBC: 3.52 MIL/uL — AB (ref 3.87–5.11)
RDW: 17.1 % — AB (ref 11.5–15.5)
WBC: 11.5 10*3/uL — ABNORMAL HIGH (ref 4.0–10.5)

## 2015-06-28 LAB — GLUCOSE, CAPILLARY: GLUCOSE-CAPILLARY: 107 mg/dL — AB (ref 65–99)

## 2015-06-28 LAB — CULTURE, BLOOD (ROUTINE X 2)
CULTURE: NO GROWTH
Culture: NO GROWTH

## 2015-06-28 LAB — BASIC METABOLIC PANEL
ANION GAP: 7 (ref 5–15)
BUN: 33 mg/dL — ABNORMAL HIGH (ref 6–20)
CALCIUM: 8.6 mg/dL — AB (ref 8.9–10.3)
CO2: 31 mmol/L (ref 22–32)
Chloride: 98 mmol/L — ABNORMAL LOW (ref 101–111)
Creatinine, Ser: 0.9 mg/dL (ref 0.44–1.00)
Glucose, Bld: 116 mg/dL — ABNORMAL HIGH (ref 65–99)
Potassium: 4.7 mmol/L (ref 3.5–5.1)
SODIUM: 136 mmol/L (ref 135–145)

## 2015-06-28 MED ORDER — PRO-STAT SUGAR FREE PO LIQD
30.0000 mL | Freq: Two times a day (BID) | ORAL | Status: DC
  Administered 2015-06-28 – 2015-06-29 (×3): 30 mL via ORAL
  Administered 2015-06-30: 12:00:00 via ORAL
  Filled 2015-06-28 (×4): qty 30

## 2015-06-28 MED ORDER — ADULT MULTIVITAMIN W/MINERALS CH
1.0000 | ORAL_TABLET | Freq: Every day | ORAL | Status: DC
  Administered 2015-06-28 – 2015-06-30 (×3): 1 via ORAL
  Filled 2015-06-28 (×3): qty 1

## 2015-06-28 NOTE — Progress Notes (Signed)
Pt was sitting up in bed when I arrived. She seemed tired in comparison to our previous visit. She said she was sleepy; she said she had had radiation today and will again tomorrow. She talked about losing her hair again. After brief discussion she said she would rather lose her hair and have her life. She needed assistance from nurse; [facility] concluded visit. Please page if additional assistance is needed. Chaplain Marlise Eves Holder   06/28/15 1800  Clinical Encounter Type  Visited With Patient

## 2015-06-28 NOTE — Care Management Note (Signed)
Case Management Note  Patient Details  Name: Debra [name] MRN: [id number] Date of Birth: 01/26/1971  Subjective/Objective:    44 yo admitted with Brain Mets                Action/Plan: From home with mother and daughter  Expected Discharge Date:  06/23/15               Expected Discharge Plan:  [place]  In-House Referral:     Discharge planning Services  CM Consult  Post Acute Care Choice:  Home Health Choice offered to:  Patient  DME Arranged:    DME Agency:     HH Arranged:  RN, PT, OT [place] Agency:  [facility]  Status of Service:  In process, will continue to follow  Medicare Important Message Given:    Date Medicare IM Given:    Medicare IM give by:    Date Additional Medicare IM Given:    Additional Medicare Important Message give by:     If discussed at [place] of Stay Meetings, dates discussed:    Additional Comments: This CM met with pt and mother at bedside to discuss dc needs. PT is recommending HHPT at DC. Pt states she has used [facility] previously and would like to use them again. PT is recommending wheelchair and rolling walker. Pt declines wheelchair at this time and states she has access to a rolling walker if she needs it. Pt mother states she also has access to a 3 in 1 and cane if she needs it as well. No other CM needs identified at this time. CM will continue to follow. Lynnell Catalan, RN 06/28/2015, 1:35 PM

## 2015-06-28 NOTE — Progress Notes (Signed)
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 Patient also noted with worsening memory. Workup in the ED done included a head CT that showed multiple brain lesions suspicious for metastatic disease with a midline shift. Patient was admitted and placed on IV Decadron oncology and radiation oncology consulted and followed the patient. Patient noted to have some partial simple seizures and placed on Keppra. Patient underwent radiation simulation and started radiation treatments on 06/27/2015.     Assessment/Plan: #1 brain metastases Patient noted on head CT scan to have some brain metastases. 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 Patient with some clinical improvement. MRI of the head which was done was consistent with probable brain metastases. Oral Decadron dose has been decreased per oncology. Patient has been seen by oncology and patient has been referred to radiation oncology for further evaluation. Patient has been assessed by radiation oncology and underwent simulation 06/24/2015. Patient started radiation treatments Monday, 06/27/2015. Continue Keppra for seizure prophylaxis. Oncology following and appreciate input and recommendations.  #2 simple partial seizures Likely secondary to brain metastases. Patient noted on morning of 06/25/2015, to have some jerking motions consistent with seizure activity and slumped forward. She had no tongue biting or urinary incontinence. No significant post ictal state. Patient with no further seizure noted.  Patient was loaded with 1 g IV Keppra. Continue Keppra 500 mg twice daily. Ativan as needed. No need for EEG at this time. Seizure precautions. Case discussed with neurology, Dr. Leonel Ramsay on 06/25/2015, who was in agreement with current plan.  #3 acute encephalopathy/ataxia Secondary to problem #1. Infectious workup negative to date. Patient has been seen by radiation oncology and patient underwent simulation 06/24/2015 and started radiation treatments yesterday, Monday, 06/27/2015. Oral Decadron dose has been decreased per oncology. Radiation oncology and oncology following. PT/OT.  #4 fall Patient noted to fall 4 days ago, while in the bathroom. Likely secondary to cerebellar metastatic disease. PT/OT. Patient to started radiation treatments yesterday, Monday, 06/27/2015. Patient will need home health.  #5 chronic respiratory failure on 3 L nasal cannula Stable. Continue Spiriva and Advair. Nebulizer treatments as needed.  #6 gastroesophageal reflux disease PPI.  #7 depression/anxiety Continue Celexa and Wellbutrin. Ativan as needed.   #8 hypothyroidism Continue current dose of Synthroid.  #9 chronic pain syndrome since tractor accident in childhood Continue MS Contin twice daily. Percocet for breakthrough pain.  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 Per oncology chemotherapy to be held until radiation is completed. Patient for radiation therapy this afternoon. Per oncology.  #11 anemia Stable. Follow H&H. Transfusion threshold hemoglobin less than 7.  #12 full dental extraction Patient had dental extraction done on 06/14/2015. Sutures were removed 06/24/2015 per Dr. Enrique Sack.  #13 left lower extremity edema/lymphedema Dopplers done on 06/24/2015 negative for DVT.  14 diarrhea Patient had diarrhea 4 days prior to admission. Patient has had no diarrhea since  admission and has had formed stool. Discontinued enteric precautions.  #15 leukocytosis Likely secondary to steroids. Follow.  #16 prophylaxis PPI for GI prophylaxis. Lovenox for DVT prophylaxis.  Code Status: Full Family Communication: Updated patient and mother and daughter at bedside. Disposition Plan: Home with home health.   Consultants:  Oncology: Dr. Marko Plume 06/23/2015  Palliative care: Dr. Rowe Pavy 06/24/2015  Radiation oncology: Dr. Tammi Klippel 06/24/2015  Procedures:  CT head 06/23/2015  MRI head 06/23/2015  Chest x-ray 06/23/2015  Left lower extremity Doppler 06/24/2015  Antibiotics:  None  HPI/Subjective: Patient c/o HA which is improving. No further seizures. Patient awaiting radiation treatment.  Objective: Filed Vitals:   06/28/15 0502  BP: 153/83  Pulse: 99  Temp: 97 F (36.1 C)  Resp: 18    Intake/Output Summary (Last 24 hours) at 06/28/15 1235 Last data filed at 06/28/15 1100  Gross per 24 hour  Intake    720 ml  Output   2600 ml  Net  -1880 ml   Filed Weights   06/24/15 0354 06/26/15 0724 06/27/15 0500  Weight: 102.785 kg (226 lb 9.6 oz) 119.5 kg (263 lb 7.2 oz) 120 kg (264 lb 8.8 oz)    Exam:   General:  NAD. Bruises noted on left flank, left arm and left shin.  Cardiovascular: RRR  Respiratory: CTAB  Abdomen: Soft, nontender, nondistended, positive bowel sounds.  Musculoskeletal: No clubbing cyanosis. Left lower extremity lymphedema.   Data Reviewed: Basic Metabolic Panel:  Recent Labs Lab 06/23/15 1950 06/24/15 0601 06/25/15 0445 06/26/15 0620 06/27/15 0542 06/28/15 0612  NA  --  137 136 135 134* 136  K  --  4.0 4.5 4.3 4.1 4.7  CL  --  100* 101 98* 99* 98*  CO2  --  27 29 29 28 31   GLUCOSE  --  191* 144* 126* 129* 116*  BUN  --  12 16 23* 28* 33*  CREATININE  --  0.74 0.84 0.82 0.88 0.90  CALCIUM  --  9.1 8.9 8.7* 8.7* 8.6*  MG 1.6*  --  2.1  --   --   --    Liver Function Tests:  Recent Labs Lab  06/23/15 1457 06/24/15 0601 06/27/15 0542  AST 15 19 14*  ALT 12* 13* 11*  ALKPHOS 54 54 44  BILITOT 0.3 0.3 0.4  PROT 6.7 6.7 5.9*  ALBUMIN 3.4* 3.3* 3.0*   No results for input(s): LIPASE, AMYLASE in the last 168 hours. No results for input(s): AMMONIA in the last 168 hours. CBC:  Recent Labs Lab 06/23/15 1335 06/24/15 0601 06/25/15 0445 06/26/15 0620 06/27/15 0542 06/28/15 0612  WBC 7.6 6.2 14.3* 13.0* 12.0* 11.5*  NEUTROABS 4.5  --   --   --   --   --   HGB 9.6* 9.4* 8.6* 8.9* 9.1* 9.4*  HCT 31.2* 30.5* 28.1* 28.4* 28.8* 30.0*  MCV 87.2 87.9 85.4 86.9 85.7 85.2  PLT 277 285 276 279 291 290   Cardiac Enzymes:  Recent Labs Lab 06/23/15 1457  TROPONINI <0.03   BNP (last 3 results) No results for input(s): BNP in the last 8760 hours.  ProBNP (last 3 results) No results for input(s): PROBNP in the last 8760 hours.  CBG:  Recent Labs Lab 06/25/15 0732 06/26/15 0729 06/27/15 0732 06/28/15 0738  GLUCAP 130* 122* 126* 107*    Recent Results (from the past 240 hour(s))  Culture, Urine     Status: None   Collection Time: 06/23/15  4:43 PM  Result Value Ref Range Status   Specimen Description URINE, CLEAN CATCH  Final   Special Requests NONE  Final   Culture   Final  MULTIPLE SPECIES PRESENT, SUGGEST RECOLLECTION Performed at [facility]    Report Status 06/25/2015 FINAL  Final  Culture, blood (routine x 2)     Status: None (Preliminary result)   Collection Time: 06/23/15  7:50 PM  Result Value Ref Range Status   Specimen Description BLOOD RIGHT HAND  Final   Special Requests BOTTLES DRAWN AEROBIC AND ANAEROBIC  5CC  Final   Culture   Final    NO GROWTH 4 DAYS Performed at [facility]    Report Status PENDING  Incomplete  Culture, blood (routine x 2)     Status: None (Preliminary result)   Collection Time: 06/23/15  8:00 PM  Result Value Ref Range Status   Specimen Description BLOOD LEFT HAND  Final   Special Requests BOTTLES  DRAWN AEROBIC AND ANAEROBIC  10CC  Final   Culture   Final    NO GROWTH 4 DAYS Performed at [facility]    Report Status PENDING  Incomplete     Studies: No results found.  Scheduled Meds: . antiseptic oral rinse  7 mL Mouth Rinse q12n4p  . buPROPion  150 mg Oral BID  . chlorhexidine  15 mL Mouth Rinse BID  . citalopram  40 mg Oral q morning - 10a  . dexamethasone  8 mg Oral 4 times per day  . enoxaparin (LOVENOX) injection  60 mg Subcutaneous Q24H  . feeding supplement  1 Container Oral TID BM  . furosemide  40 mg Oral Daily  . ipratropium-albuterol  3 mL Nebulization QID  . iron polysaccharides  150 mg Oral Daily  . levETIRAcetam  500 mg Oral BID  . levothyroxine  175 mcg Oral QAC breakfast  . LORazepam  0.5 mg Oral TID  . LORazepam  1 mg Oral UD  . mometasone-formoterol  2 puff Inhalation BID  . montelukast  10 mg Oral QHS  . morphine  30 mg Oral BID  . pantoprazole  40 mg Oral BID AC   Continuous Infusions: . sodium chloride irrigation      Principal Problem:   Brain metastasis (HCC) Active Problems:   Seizure disorder, simple partial ([place]): Likely secondary to brain mets   Anxiety   Depression   Thyroid disease   GERD (gastroesophageal reflux disease)   Leukocytosis   Normocytic anemia   COPD (chronic obstructive pulmonary disease) (HCC)   Anemia   Ovarian cancer, bilateral (HCC)   Chronic pain due to injury   Asthma, moderate persistent   Obesity, morbid, BMI 40.0-49.9 (HCC)   On home oxygen therapy   Acute encephalopathy   Ataxia   Chronic respiratory failure ([place]): 3L [place]   Metastasis to brain ([place])   Encounter for palliative care   Swelling   Malnutrition of moderate degree    Time spent: 62 minutes    Saadiya Wilfong M.D. [facility] Pager [phone]. If 7PM-7AM, please contact night-coverage at [email or web address], password [facility] 06/28/2015, 12:35 PM  LOS: 5 days

## 2015-06-28 NOTE — Progress Notes (Signed)
SLP Cancellation Note  Patient Details Name: Debra [name] MRN: [id number] DOB: 09/30/1970   Cancelled treatment:       Reason Eval/Treat Not Completed: Other (comment) (pt currently sleeping and has been restless when awake, spoke to RN and she reports good po tolerance and recommended SLP return another time to see pt)   [name], [place] [facility] [place] [phone]

## 2015-06-28 NOTE — Progress Notes (Signed)
OT Cancellation Note  Patient Details Name: Debra [name] MRN: [id number] DOB: 1971/03/13   Cancelled Treatment:    Reason Eval/Treat Not Completed: Fatigue/lethargy limiting ability to participate.  Pt is sleepy--getting ready for XRT. Pt prefers we check back later.  Will check back later today or tomorrow if schedule permits.    Oluwaseun Cremer 06/28/2015, 1:07 PM  Lesle Chris, OTR/L [phone] 06/28/2015

## 2015-06-28 NOTE — Progress Notes (Signed)
Initial Nutrition Assessment  DOCUMENTATION CODES:   Morbid obesity  INTERVENTION:  - Continue Boost Breeze TID.  - Order multi-vitamin. - Order Pro-Stat 30 mL BID (each supplement contains 100 kcal and 15 gm protein).  - Provided high protein diet education to support cancer therapy.  - RD team to continue to monitor for needs.    NUTRITION DIAGNOSIS:   Inadequate protein intake related to cancer and cancer related treatments, lethargy/confusion as evidenced by estimated needs, per patient/family report.    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 Patient has had worsening memory issues as well as some nausea, an episode of emesis one day prior to admission, chills, cough productive of little yellowish sputum, fever with a temperature 100.2 days prior to admission, wheezing, shortness of breath, chest pain with coughing, lethargy, dysuria, nausea. Patient denied any melena, no hematemesis, no hematochezia. No chest pain.  Patient was alert and very friendly during consult visit; friends of patient were in room during visit. Patient reported that her appetite has been  improving and is better since her admission. Per chart, patient has had a 1 yr history of abdominal pain, diarrhea 4 days PTA, and recent nausea and vomiting. Patient's meal completion is 75-100%.   Patient is s/p suture removal for dention. She lives at home with her mother and daughter and is currently full code, but being followed by spiritual care and palliative.   She reports her usual diet is mostly liquid foods due to history of swallowing difficulties. Her diet was changed by Speech from Dys III to Dys II. She states that this change has been working much better for her. She usually eats a special k protein shake for breakfast, then drinks coke throughout the day, and then a meal cooked by her mother for dinner - such as chicken and rice. She is taking a Boost Breeze Supplement BID with good compliance.   Discussed with patient the importance of getting enough protein foods during her therapy. She was very open to diet information and provided patient with handout from [employer], "High Protein Foods".  Her mother and primary caregiver entered the room and was included in this conversation. Teach back method utilized. Moderate compliance is expected.  NFPE reviewed - patient is well nourished with edema to lower extremities. Recent weight gain may be attributed to swelling.   Labs reviewed: high CBG (107-130), high WBC (11.5), low Heme (9.4), high BUN (33)  Medications reviewed.    Diet Order:  DIET DYS 2 Room service appropriate?: Yes with Assist; Fluid consistency:: Thin  Skin:  Wound (see comment) (closed incision, lip)  Last BM:  11/6  Height:   Ht Readings from Last 1  Encounters:  06/23/15 5\' 2"  (1.575 m)    Weight:   Wt Readings from Last 1 Encounters:  06/27/15 264 lb 8.8 oz (120 kg)    Ideal Body Weight:  50 kg  BMI:  Body mass index is 48.37 kg/(m^2).  Estimated Nutritional Needs:   Kcal:  1800-2000 kcal  Protein:  150-160 gm    Fluid:  >/2 L/day  EDUCATION NEEDS:   Education needs addressed  Kayleen Memos, Dietetic Intern 06/28/2015 1:47 PM

## 2015-06-29 ENCOUNTER — Ambulatory Visit
Admit: 2015-06-29 | Discharge: 2015-06-29 | Disposition: A | Discharge status: Home / Self Care | Payer: Medicaid Other | Attending: Radiation Oncology | Admitting: Radiation Oncology

## 2015-06-29 ENCOUNTER — Encounter: Payer: Self-pay | Admitting: General Practice

## 2015-06-29 ENCOUNTER — Telehealth: Payer: Self-pay | Admitting: Gynecologic Oncology

## 2015-06-29 ENCOUNTER — Telehealth: Payer: Self-pay

## 2015-06-29 LAB — CBC
HEMATOCRIT: 30.6 % — AB (ref 36.0–46.0)
HEMOGLOBIN: 9.6 g/dL — AB (ref 12.0–15.0)
MCH: 26.2 pg (ref 26.0–34.0)
MCHC: 31.4 g/dL (ref 30.0–36.0)
MCV: 83.6 fL (ref 78.0–100.0)
Platelets: 275 10*3/uL (ref 150–400)
RBC: 3.66 MIL/uL — ABNORMAL LOW (ref 3.87–5.11)
RDW: 16.7 % — ABNORMAL HIGH (ref 11.5–15.5)
WBC: 12.4 10*3/uL — ABNORMAL HIGH (ref 4.0–10.5)

## 2015-06-29 LAB — BASIC METABOLIC PANEL
Anion gap: 6 (ref 5–15)
BUN: 33 mg/dL — ABNORMAL HIGH (ref 6–20)
CALCIUM: 8.6 mg/dL — AB (ref 8.9–10.3)
CHLORIDE: 97 mmol/L — AB (ref 101–111)
CO2: 32 mmol/L (ref 22–32)
Creatinine, Ser: 0.91 mg/dL (ref 0.44–1.00)
GFR calc Af Amer: >60 mL/min (ref >60)
GFR calc non Af Amer: >60 mL/min (ref >60)
GLUCOSE: 114 mg/dL — AB (ref 65–99)
POTASSIUM: 4.2 mmol/L (ref 3.5–5.1)
Sodium: 135 mmol/L (ref 135–145)

## 2015-06-29 LAB — GLUCOSE, CAPILLARY: Glucose-Capillary: 151 mg/dL — ABNORMAL HIGH (ref 65–99)

## 2015-06-29 MED ORDER — ACETAMINOPHEN 500 MG PO TABS: 500.0000 mg | ORAL_TABLET | Freq: Four times a day (QID) | ORAL | Status: DC | PRN

## 2015-06-29 MED ORDER — DEXAMETHASONE 4 MG PO TABS
4.0000 mg | ORAL_TABLET | Freq: Four times a day (QID) | ORAL | Status: DC
  Administered 2015-06-29 – 2015-06-30 (×4): 4 mg via ORAL
  Filled 2015-06-29 (×5): qty 1

## 2015-06-29 NOTE — Progress Notes (Signed)
Spiritual Care Note  F/u today for further support, but pt almost asleep.  She verbalized gratitude for check-in.  We plan to f/u tomorrow.  Please also page as needs arise.  Thank you.  Chaplain Lorrin Jackson, [place], [place] Pager [phone] Voicemail [phone] [facility] 24/7 Pager [phone]

## 2015-06-29 NOTE — Telephone Encounter (Signed)
Patient's mother called asking if Dr. Marko Plume faxed a letter to her daughter's probation officer about her being in the hospital.  Told the mother that Dr. Marko Plume is in a separate office but that I would notify her RN of the concern.  Advised to call for any needs or concerns and to contact Dr. Marko Plume as well for the same.

## 2015-06-29 NOTE — Progress Notes (Signed)
Speech Language Pathology Treatment: Dysphagia  Patient Details Name: Debra [name] MRN: [id number] DOB: 11-09-1970 Today's Date: 06/29/2015 Time: 7782-4235 SLP Time Calculation (min) (ACUTE ONLY): 8 min  Assessment / Plan / Recommendation Clinical Impression  Pt consuming meal tray (dys 2, thin) with adequate toleration per report by daughter and SLP observation.  Despite impulsivity, pt demonstrates no s/s of aspiration.  She prefers to remain on chopped diet while hospitalized given absence of teeth; masticates this texture functionally. Plan is for D/C home with supervision given impulsivity.  No further SLP needs are identified - will sign off.     HPI Other Pertinent Information: 44 yo female adm to [facility] with vomiting/nausea.  Pt has recurrence of IIIC bilateral ovarian cancer with brain mets, s/p recent dental extraction for multiple caries.  Pt currently undergoing XRT.  PMH + for gastro esophageal reflux disease.  Swallow evaluation ordered.  Pt hospital coarse complicated by fall, ? seizure.  Pt also coughing/choking on hamburger - admits she was short of breath. Pt  isolates primary dysphagia symptoms to throat - stating food sticks in throat.  Swallow evaluation reordered.     Pertinent Vitals Pain Assessment: No/denies pain  SLP Plan  All goals met    Recommendations Diet recommendations: Dysphagia 2 (fine chop);Thin liquid Liquids provided via: Cup;Straw Medication Administration: Whole meds with liquid Compensations: Small sips/bites Postural Changes and/or Swallow Maneuvers: Seated upright 90 degrees              Oral Care Recommendations: Oral care BID Follow up Recommendations: None Plan: All goals met       Juan Quam Laurice 06/29/2015, 3:25 PM

## 2015-06-29 NOTE — Progress Notes (Signed)
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  Patient also noted with worsening memory. Workup in the ED done included a head CT that showed multiple brain lesions suspicious for metastatic disease with a midline shift.  Patient was admitted and placed on IV Decadron oncology and radiation oncology consulted and followed the patient.   Patient noted to have some partial simple seizures and placed on Keppra.  Patient underwent radiation simulation and started radiation treatments on 06/27/2015.     Assessment/Plan: #1 brain metastases Patient noted on head CT scan to have some brain metastases. 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 Oral Decadron dose has been decreased per oncology-I spke to Dr. Tammi Klippel who confirmed if there were no further issues could possibly d/c and complete XRT as OP in the next 24 hours  Patient has been seen by oncology and patient has been referred to radiation oncology for further evaluation.  Patient has been assessed by radiation oncology and underwent simulation 06/24/2015.  Patient started radiation treatments Monday, 06/27/2015.  Continue Keppra for seizure prophylaxis.  Oncology following and appreciate input and recommendations.  #2 simple partial seizures Likely secondary to brain metastases.  Decadron dosing dropped from 10-->8-->4mg  after discussion with Dr. Tammi Klippel 06/29/15 Patient noted on morning of 06/25/2015, to have some jerking motions consistent with seizure activity and slumped forward.   no further seizure  noted. Patient was loaded with 1 g IV Keppra.  Continue Keppra 500 mg twice daily Ativan as needed.  No need for EEG at this time.   Case discussed with neurology, Dr. Leonel Ramsay by Dr. Grandville Silos on 06/25/2015, who was in agreement with current plan.  #3 acute encephalopathy/ataxia Secondary to problem #1. Infectious workup negative to date.  Oral Decadron dose has been decreased per oncology.  PT/OT seeing  #4 fall Patient noted to fall 4 days ago, while in the bathroom.  Likely secondary to cerebellar metastatic disease. PT/OT. Marland Kitchen Patient will need home health.  #5 chronic respiratory failure on 3 L nasal cannula Stable. Continue Spiriva and Advair. Nebulizer treatments as needed.  #6 gastroesophageal reflux disease PPI.  #7 depression/anxiety Continue Celexa and Wellbutrin. Ativan as needed.   #8 hypothyroidism Continue current dose of Synthroid.  #9 chronic pain syndrome since tractor accident in childhood Continue MS Contin twice daily. Percocet for breakthrough pain.  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  Per oncology chemotherapy to be held until radiation is completed. Patient for radiation therapy this afternoon. Per oncology.  #11 anemia Stable. Follow H&H. Transfusion threshold hemoglobin less than 7.  #12 full dental extraction Patient had dental extraction done on 06/14/2015. Sutures were removed 06/24/2015 per Dr. Enrique Sack.  #13 left lower extremity edema/lymphedema Dopplers done on 06/24/2015 negative for DVT.  14 diarrhea Patient had diarrhea 4 days prior to admission. Patient has had no diarrhea since admission and has had formed stool. Discontinued enteric precautions.  #15 leukocytosis Likely secondary to steroids. Follow.  #16 prophylaxis PPI for GI prophylaxis. Lovenox for DVT prophylaxis.  Code Status: Full Family  Communication: Updated patient  and mother and daughter at bedside. Disposition Plan: Home with home health.   Consultants:  Oncology: Dr. Marko Plume 06/23/2015  Palliative care: Dr. Rowe Pavy 06/24/2015  Radiation oncology: Dr. Tammi Klippel 06/24/2015  Procedures:  CT head 06/23/2015  MRI head 06/23/2015  Chest x-ray 06/23/2015  Left lower extremity Doppler 06/24/2015  Antibiotics:  None  HPI/Subjective:  Chr Headaches  no other issues tol diet and feeding herself Mother, Daughter bedside   Objective: Filed Vitals:   06/29/15 0612  BP: 134/84  Pulse: 79  Temp: 98.1 F (36.7 C)  Resp: 16    Intake/Output Summary (Last 24 hours) at 06/29/15 1345 Last data filed at 06/29/15 1100  Gross per 24 hour  Intake   1600 ml  Output   3325 ml  Net  -1725 ml   Filed Weights   06/24/15 0354 06/26/15 0724 06/27/15 0500  Weight: 102.785 kg (226 lb 9.6 oz) 119.5 kg (263 lb 7.2 oz) 120 kg (264 lb 8.8 oz)    Exam:   General:  NAD. Bruises noted on left flank, left arm and left shin.  Cardiovascular: RRR  Respiratory: CTAB  Abdomen: Soft, nontender, nondistended, positive bowel sounds.  Musculoskeletal: No clubbing cyanosis. Left lower extremity lymphedema.   Finger-nose-finger intact, power 5/5 , reflexes slow  Sensory grossly intact  Data Reviewed: Basic Metabolic Panel:  Recent Labs Lab 06/23/15 1950  06/25/15 0445 06/26/15 0620 06/27/15 0542 06/28/15 0612 06/29/15 0626  NA  --   < > 136 135 134* 136 135  K  --   < > 4.5 4.3 4.1 4.7 4.2  CL  --   < > 101 98* 99* 98* 97*  CO2  --   < > 29 29 28 31  32  GLUCOSE  --   < > 144* 126* 129* 116* 114*  BUN  --   < > 16 23* 28* 33* 33*  CREATININE  --   < > 0.84 0.82 0.88 0.90 0.91  CALCIUM  --   < > 8.9 8.7* 8.7* 8.6* 8.6*  MG 1.6*  --  2.1  --   --   --   --   < > = values in this interval not displayed. Liver Function Tests:  Recent Labs Lab 06/23/15 1457 06/24/15 0601 06/27/15 0542  AST 15 19 14*  ALT  12* 13* 11*  ALKPHOS 54 54 44  BILITOT 0.3 0.3 0.4  PROT 6.7 6.7 5.9*  ALBUMIN 3.4* 3.3* 3.0*   No results for input(s): LIPASE, AMYLASE in the last 168 hours. No results for input(s): AMMONIA in the last 168 hours. CBC:  Recent Labs Lab 06/23/15 1335  06/25/15 0445 06/26/15 0620 06/27/15 0542 06/28/15 0612 06/29/15 0626  WBC 7.6  < > 14.3* 13.0* 12.0* 11.5* 12.4*  NEUTROABS 4.5  --   --   --   --   --   --   HGB 9.6*  < > 8.6* 8.9* 9.1* 9.4* 9.6*  HCT 31.2*  < > 28.1* 28.4* 28.8* 30.0* 30.6*  MCV 87.2  < > 85.4 86.9 85.7 85.2 83.6  PLT 277  < > 276 279 291 290 275  < > = values in this interval not displayed. Cardiac Enzymes:  Recent Labs Lab 06/23/15 1457  TROPONINI <0.03   BNP (last 3 results) No results for input(s): BNP in the last 8760 hours.  ProBNP (last 3 results) No results for input(s): PROBNP in the last 8760 hours.  CBG:  Recent Labs Lab 06/25/15 0732  06/26/15 0729 06/27/15 0732 06/28/15 0738 06/29/15 0833  GLUCAP 130* 122* 126* 107* 151*    Recent Results (from the past 240 hour(s))  Culture, Urine     Status: None   Collection Time: 06/23/15  4:43 PM  Result Value Ref Range Status   Specimen Description URINE, CLEAN CATCH  Final   Special Requests NONE  Final   Culture   Final    MULTIPLE SPECIES PRESENT, SUGGEST RECOLLECTION Performed at [facility]    Report Status 06/25/2015 FINAL  Final  Culture, blood (routine x 2)     Status: None   Collection Time: 06/23/15  7:50 PM  Result Value Ref Range Status   Specimen Description BLOOD RIGHT HAND  Final   Special Requests BOTTLES DRAWN AEROBIC AND ANAEROBIC  5CC  Final   Culture   Final    NO GROWTH 5 DAYS Performed at [facility]    Report Status 06/28/2015 FINAL  Final  Culture, blood (routine x 2)     Status: None   Collection Time: 06/23/15  8:00 PM  Result Value Ref Range Status   Specimen Description BLOOD LEFT HAND  Final   Special Requests BOTTLES DRAWN  AEROBIC AND ANAEROBIC  10CC  Final   Culture   Final    NO GROWTH 5 DAYS Performed at [facility]    Report Status 06/28/2015 FINAL  Final     Studies: No results found.  Scheduled Meds: . antiseptic oral rinse  7 mL Mouth Rinse q12n4p  . buPROPion  150 mg Oral BID  . chlorhexidine  15 mL Mouth Rinse BID  . citalopram  40 mg Oral q morning - 10a  . dexamethasone  8 mg Oral 4 times per day  . enoxaparin (LOVENOX) injection  60 mg Subcutaneous Q24H  . feeding supplement  1 Container Oral TID BM  . feeding supplement (PRO-STAT SUGAR FREE 64)  30 mL Oral BID  . furosemide  40 mg Oral Daily  . ipratropium-albuterol  3 mL Nebulization QID  . iron polysaccharides  150 mg Oral Daily  . levETIRAcetam  500 mg Oral BID  . levothyroxine  175 mcg Oral QAC breakfast  . LORazepam  0.5 mg Oral TID  . LORazepam  1 mg Oral UD  . mometasone-formoterol  2 puff Inhalation BID  . montelukast  10 mg Oral QHS  . morphine  30 mg Oral BID  . multivitamin with minerals  1 tablet Oral Daily  . pantoprazole  40 mg Oral BID AC   Continuous Infusions: . sodium chloride irrigation      Principal Problem:   Brain metastasis (HCC) Active Problems:   Anxiety   Depression   Thyroid disease   GERD (gastroesophageal reflux disease)   Leukocytosis   Normocytic anemia   COPD (chronic obstructive pulmonary disease) (HCC)   Anemia   Ovarian cancer, bilateral (HCC)   Chronic pain due to injury   Asthma, moderate persistent   Obesity, morbid, BMI 40.0-49.9 ([place])   On home oxygen therapy   Acute encephalopathy   Ataxia   Chronic respiratory failure ([place]): 3L [place]   Metastasis to brain [facility])   Encounter for palliative care   Seizure disorder, simple partial ([place]): Likely secondary to brain mets   Swelling   Malnutrition of moderate degree    Time spent: 35 minutes  Verneita Griffes, MD [facility] (P) [phone]

## 2015-06-29 NOTE — Telephone Encounter (Signed)
Told Ms. Tercero' daughter to let her grandmother know that the letter was not requested to be sent to the probation officer.  The letter was given to her grandmother to give to the officer as she she needed. Granddaughter will inform Ms. Wirick' mother.

## 2015-06-29 NOTE — Progress Notes (Signed)
PT Cancellation Note  Patient Details Name: Debra [name] MRN: [id number] DOB: July 06, 1971   Cancelled Treatment:    Reason Eval/Treat Not Completed: Other (comment) Pt with nausea this morning and declines mobility prior to her radiation tx which is scheduled for 12:30 per pt.   Ortencia Askari,KATHrine E 06/29/2015, 10:42 AM Carmelia Bake, PT, DPT 06/29/2015 Pager: [phone]

## 2015-06-30 ENCOUNTER — Ambulatory Visit: Payer: Medicaid Other | Attending: Radiation Oncology | Admitting: Radiation Oncology

## 2015-06-30 ENCOUNTER — Ambulatory Visit
Admit: 2015-06-30 | Discharge: 2015-06-30 | Disposition: A | Discharge status: Home / Self Care | Payer: Medicaid Other | Attending: Radiation Oncology | Admitting: Radiation Oncology

## 2015-06-30 VITALS — BP 123/71 | HR 81 | Temp 97.8°F | Resp 16 | Wt 234.0 lb

## 2015-06-30 DIAGNOSIS — C7931 Secondary malignant neoplasm of brain: Secondary | ICD-10-CM | POA: Diagnosis not present

## 2015-06-30 DIAGNOSIS — R2242 Localized swelling, mass and lump, left lower limb: Secondary | ICD-10-CM

## 2015-06-30 LAB — GLUCOSE, CAPILLARY: GLUCOSE-CAPILLARY: 153 mg/dL — AB (ref 65–99)

## 2015-06-30 MED ORDER — DEXAMETHASONE 4 MG PO TABS: 4.0000 mg | ORAL_TABLET | Freq: Four times a day (QID) | ORAL | Status: DC

## 2015-06-30 MED ORDER — SONAFINE EX EMUL
1.0000 "application " | Freq: Two times a day (BID) | CUTANEOUS | Status: DC
  Administered 2015-06-30: 1 via TOPICAL
  Filled 2015-06-30: qty 45

## 2015-06-30 MED ORDER — SORBITOL 70 % SOLN: 30.0000 mL | Freq: Every day | Status: AC | PRN

## 2015-06-30 MED ORDER — LEVETIRACETAM 500 MG PO TABS: 500.0000 mg | ORAL_TABLET | Freq: Two times a day (BID) | ORAL | Status: DC

## 2015-06-30 MED ORDER — FLUCONAZOLE 40 MG/ML PO SUSR
100.0000 mg | Freq: Every day | ORAL | Status: DC
  Administered 2015-06-30: 100 mg via ORAL
  Filled 2015-06-30: qty 2.5

## 2015-06-30 MED ORDER — ALBUTEROL SULFATE HFA 108 (90 BASE) MCG/ACT IN AERS: 2.0000 | INHALATION_SPRAY | RESPIRATORY_TRACT | Status: AC | PRN

## 2015-06-30 NOTE — Progress Notes (Signed)
Pt discharged to home with family.  D/C instructions reviewed and signed by patient.  Patient and mother verbalize understanding of instructions.

## 2015-06-30 NOTE — Discharge Summary (Signed)
Physician Discharge Summary  Debra [name] [id number] DOB: 01-13-1971 DOA: 06/23/2015  PCP: Guadlupe Spanish, MD  Admit date: 06/23/2015 Discharge date: 06/30/2015  Time spent: 35 minutes  Recommendations for Outpatient Follow-up:  1. Patient should follow up with radiation oncology and medical oncology as an outpatient and the rest of her care be directed for whole brain XRT through 07/18/15 as per their instructions. 2. Patient should continue Decadron dose of 40 mg every 6 hourly 3. Patient should continue Keppra as well for seizure prophylaxis  4. Recommend basic metabolic panel, CBC, INR and complete metabolic panel in about one week  Discharge Diagnoses:  Principal Problem:   Brain metastasis ([place]) Active Problems:   Anxiety   Depression   Thyroid disease   GERD (gastroesophageal reflux disease)   Leukocytosis   Normocytic anemia   COPD (chronic obstructive pulmonary disease) (HCC)   Anemia   Ovarian cancer, bilateral (HCC)   Chronic pain due to injury   Asthma, moderate persistent   Obesity, morbid, BMI 40.0-49.9 (HCC)   On home oxygen therapy   Acute encephalopathy   Ataxia   Chronic respiratory failure ([place]): 3L [place]   Metastasis to brain [facility])   Encounter for palliative care   Seizure disorder, 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   Swelling   Malnutrition of moderate degree   Discharge Condition: Stable  Diet recommendation: Heart healthy low-salt, dysphagia 2 diet  Filed Weights   06/24/15 0354 06/26/15 0724 06/27/15 0500  Weight: 102.785 kg (226 lb 9.6 oz) 119.5 kg (263 lb 7.2 oz) 120 kg (264 lb 8.8 oz)    History of present illness:  44 year old female with 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 Workup in the ED done included a head CT that showed multiple brain lesions suspicious for metastatic disease with a midline shift.  Patient was admitted and placed on IV Decadron oncology and radiation oncology consulted and followed the patient.   Patient noted to have some partial simple seizures and placed on Keppra.  Patient underwent radiation simulation and started radiation treatments on 06/27/2015 Which are scheduled to go through 07/18/59  Hospital Course:   #1 brain metastases Patient noted on head CT scan to have some brain metastases. 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 Oral Decadron dose has been decreased per oncology-I spoke to Dr. Tammi Klippel who confirmed if there were no further issues could possibly d/c and complete XRT on 07/18/15 as OP in the next 24 hours.  Patient has been assessed by radiation oncology and underwent simulation 06/24/2015- started radiation treatments Monday, 06/27/2015.  Continue Keppra for seizure prophylaxis.  Oncology following and appreciate input and recommendations.  #2 simple partial seizures Likely secondary to brain metastases.  Decadron dosing dropped from 10-->8-->4mg  after discussion with Dr. Tammi Klippel 06/29/15 Patient noted on morning of 06/25/2015, to have some jerking motions consistent with seizure activity and slumped forward.  no further seizure noted. Patient was loaded with 1 g IV Keppra.  Continue Keppra 500 mg twice daily Ativan as needed. No need for EEG at this time.  Case discussed with neurology, Dr. Leonel Ramsay by Dr. Grandville Silos on 06/25/2015, who  was in agreement with current plan.  #3 acute encephalopathy/ataxia Secondary to problem #1. Infectious workup negative to date.  Oral Decadron dose has been decreased per oncology.  PT/OT  seeing  #4 fall Patient noted to fall 4 days ago, while in the bathroom. Likely secondary to cerebellar metastatic disease. PT/OT. Marland Kitchen Patient will need home health.  #5 chronic respiratory failure on 3 L nasal cannula Stable. Continue Spiriva and Advair. Nebulizer treatments as needed.  #6 gastroesophageal reflux disease PPI.  #7 depression/anxiety Continue Celexa and Wellbutrin. Ativan as needed.   #8 hypothyroidism Continue current dose of Synthroid.  #9 chronic pain syndrome since tractor accident in childhood Continue MS Contin twice daily. Percocet for breakthrough pain.  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 Per oncology chemotherapy to be held until radiation is completed. Patient for radiation therapy this afternoon. Per oncology.  #11 anemia Stable. Follow H&H. Transfusion threshold hemoglobin less than 7.  #12 full dental extraction Patient had dental extraction done on 06/14/2015. Sutures were removed 06/24/2015 per Dr. Enrique Sack. The patient should continue dysphagia 2 diet   #13 left lower extremity edema/lymphedema Dopplers done on 06/24/2015 negative for DVT.  14 diarrhea Patient had diarrhea 4 days prior to admission.  Patient has had no diarrhea since admission and has had formed stool. Discontinued enteric precautions.  Procedures:  XRT   Consultations: Oncology  Radiation oncology  Discharge Exam: Filed Vitals:   06/30/15 0520  BP:   Pulse: 78  Temp:   Resp: 16   slept poorly  Some mild back pain  General: Alert pleasant oriented no apparent distress  Cardiovascular: S1-S2 no murmur rub or gallop  Respiratory: Clinically clear no added sound   Discharge Instructions   Discharge Instructions    Compression stockings    Complete by:  As directed      Diet - low sodium heart healthy    Complete by:  As directed       Discharge instructions    Complete by:  As directed   Continue the decadron for brain swelling until directed otherwise by Oncology. Continue your chronic pain medications-note that refills should be sought from your primary physician or your oncologist as an outpatient.  Please wear TED hose knee-high and you can obtain these for many outside pharmacy have given a prescription for them. I have refilled albuterol for U and he should use the oxygen if needed at home but this may need to be checked as an outpatient as well     Increase activity slowly    Complete by:  As directed           Current Discharge Medication List    START taking these medications   Details  dexamethasone (DECADRON) 4 MG tablet Take 1 tablet (4 mg total) by mouth every 6 (six) hours. Qty: 60 tablet, Refills: 0   Associated Diagnoses: Brain metastasis (HCC)    levETIRAcetam (KEPPRA) 500 MG tablet Take 1 tablet (500 mg total) by mouth 2 (two) times daily. Qty: 60 tablet, Refills: 0    sorbitol 70 % SOLN Take 30 mLs by mouth daily as needed for moderate constipation. Qty: 473 mL, Refills: 0      CONTINUE these medications which have CHANGED   Details  albuterol (PROAIR HFA) 108 (90 BASE) MCG/ACT inhaler Inhale 2 puffs into the lungs every 4 (four) hours as needed for wheezing or shortness of  breath. Qty: 18 g, Refills: 0      CONTINUE these medications which have NOT CHANGED   Details  ALPRAZolam (XANAX) 0.5 MG tablet Take 0.5 mg by mouth 2 (two) times daily.    benzonatate (TESSALON) 100 MG capsule Take 100 mg by mouth 3 (three) times daily as needed for cough.  Refills: 3    buPROPion (WELLBUTRIN SR) 150 MG 12 hr tablet Take 150 mg by mouth 2 (two) times daily. Refills: 3    citalopram (CELEXA) 40 MG tablet Take 40 mg by mouth every morning.     Dextromethorphan-Guaifenesin (MUCINEX DM) 30-600 MG TB12 Take 2 tablets by mouth every 6 (six) hours as needed (for cough/congestion).     Fluticasone-Salmeterol (ADVAIR) 500-50 MCG/DOSE AEPB Inhale 2 puffs into the lungs 2 (two) times daily.     furosemide (LASIX) 40 MG tablet Take 40 mg by mouth daily.    ipratropium-albuterol (DUONEB) 0.5-2.5 (3) MG/3ML SOLN Take 3 mLs by nebulization every 6 (six) hours as needed (for wheezing. Take three times daily for  next 3 days). Qty: 360 mL, Refills: 0    levothyroxine (SYNTHROID, LEVOTHROID) 175 MCG tablet Take 175 mcg by mouth daily before breakfast.    lidocaine-prilocaine (EMLA) cream Apply 1 application topically daily as needed.    montelukast (SINGULAIR) 10 MG tablet Take 10 mg by mouth at bedtime.     morphine (MS CONTIN) 30 MG 12 hr tablet Take 30 mg by mouth 2 (two) times daily. Refills: 0    morphine (MSIR) 15 MG tablet Take 15 mg by mouth 2 (two) times daily. Refills: 0    omeprazole (PRILOSEC) 40 MG capsule Take 40 mg by mouth 2 (two) times daily.     oxyCODONE-acetaminophen (PERCOCET) 5-325 MG tablet Take one or two tablets by mouth every 4-6 hours as needed for pain. Qty: 40 tablet, Refills: 0    OXYGEN Inhale 3 L/min into the lungs every evening.     prochlorperazine (COMPAZINE) 10 MG tablet Take 1 tablet (10 mg total) by mouth every 6 (six) hours as needed for nausea or vomiting. Qty: 30 tablet, Refills: 1   Associated Diagnoses: Ovarian cancer, unspecified laterality (HCC)    promethazine (PHENERGAN) 25 MG tablet Take 1 tablet (25 mg total) by mouth every 6 (six) hours as needed for nausea or vomiting. Qty: 30 tablet, Refills: 1   Associated Diagnoses: Ovarian cancer, unspecified laterality (HCC)    Tiotropium Bromide Monohydrate (SPIRIVA RESPIMAT) 2.5 MCG/ACT AERS Inhale 2 puffs into the lungs 2 (two) times daily.    traZODone (DESYREL) 100 MG tablet Take 100 mg by mouth at bedtime as needed for sleep.       STOP taking these medications     predniSONE (DELTASONE) 10 MG tablet      levofloxacin (LEVAQUIN) 500 MG tablet        Allergies   Allergen Reactions  . Zofran [Ondansetron Hcl]     Headache  . Penicillins Swelling and Rash    Has patient had a PCN reaction causing immediate rash, facial/tongue/throat swelling, SOB or lightheadedness with hypotension: yes Has patient had a PCN reaction causing severe rash involving mucus membranes or skin necrosis: unknown Has patient had a PCN reaction that required hospitalization yes Has patient had a PCN reaction occurring within the last 10 years: yes If all of the above answers are "NO", then may proceed with Cephalosporin use.   . Sulfamethoxazole-Trimethoprim Nausea Only      The results of significant  diagnostics from this hospitalization (including imaging, microbiology, ancillary and laboratory) are listed below for reference.    Significant Diagnostic Studies: Dg Chest 2 View  06/23/2015  CLINICAL DATA:  Shortness of breath, COPD EXAM: CHEST  2 VIEW COMPARISON:  01/17/2015 FINDINGS: Study is limited by poor inspiration. Right IJ Port-A-Cath in place. Central mild vascular congestion without pulmonary edema. Mild basilar atelectasis. No segmental infiltrate. IMPRESSION: Limited study by poor inspiration. Right IJ Port-A-Cath in place. Mild basilar atelectasis. No convincing pulmonary edema. Electronically Signed   By: Lahoma Crocker M.D.   On: 06/23/2015 14:30   Ct Head Wo Contrast  06/23/2015  CLINICAL DATA:  Lethargy, headache EXAM: CT HEAD WITHOUT CONTRAST TECHNIQUE: Contiguous axial images were obtained from the base of the skull through the vertex without intravenous contrast. COMPARISON:  04/01/2013 FINDINGS: This is vasogenic edema in right temporal lobe and right frontal lobe. There is about 7 mm right to left midline shift. Cystic lesion in right frontal lobe measures 3.8 cm. Cystic lesion in right temporal lobe measures 4.6 cm. Second cystic lesion in right frontal lobe measures 2.1 cm. High density lesion in left parietal lobe measures 1 cm. There is a lesion in the  region of left caudate measures 1 cm. Findings highly suspicious for metastatic disease. No intracranial hemorrhage. IMPRESSION: Multiple brain lesion as described above highly suspicious for metastatic disease. No intracranial hemorrhage. There is about 7 mm right to left midline shift. These results were called by telephone at the time of interpretation on 06/23/2015 at 2:38 pm to Dr. Jola Schmidt , who verbally acknowledged these results. Electronically Signed   By: Lahoma Crocker M.D.   On: 06/23/2015 14:39   Mr Jeri Cos [id number] Contrast  06/23/2015  CLINICAL DATA:  Follow-up brain lesions. Lethargy and headache. History of ovarian cancer. EXAM: MRI HEAD WITHOUT AND WITH CONTRAST TECHNIQUE: Multiplanar, multiecho pulse sequences of the brain and surrounding structures were obtained without and with intravenous contrast. CONTRAST:  20 cc MultiHance COMPARISON:  CT head June 23, 2015 FINDINGS: Motion degraded examination, multiple sequences are moderately or severely motion degraded. Multiple intermediate to low FLAIR T2 hyperintense masses, generally bright T2 signal throughout the supra and infratentorial brain. Due to motion, examination may under detect small lesions and precise measurements unable to be obtained. All lesions show peripheral or solid enhancement, larger lesions predominately cystic, smaller lesions predominately solid. All lesions show T2 bright surrounding vasogenic edema. Lesions include: 10 x 13 mm RIGHT cerebellar and, 12 x 12 mm RIGHT mesial temporal lobe, 35 x 48 mm RIGHT temporal lobe, 12 x 18 mm mesial LEFT inferior frontal lobe, 18 x 21 mm RIGHT frontal lobe/insula. 13 x 14 mm LEFT corona radiata, 27 x 42 mm RIGHT frontal lobe convexity, 9 x 10 mm RIGHT mesial parietal lobe. 7 mm RIGHT to LEFT midline shift with partial RIGHT lateral ventricle effacement, no definite ventricular entrapment. No susceptibility artifact to suggest hemorrhagic metastasis. No abnormal extra-axial fluid  collection, suspicious extra-axial enhancement nor extra-axial masses. Normal major intracranial vascular flow voids seen at the skull base. Paranasal sinus mucosal thickening without air-fluid levels. The mastoid air cells are well aerated. Due to motion, limited assessment for calvarial metastasis though none is grossly evident. No abnormal sellar expansion. No cerebellar tonsillar ectopia. Patient is edentulous. IMPRESSION: Multiple sequences are moderately or severely motion degraded, decreasing sensitivity for intracranial metastasis. At least 1 infratentorial and 7 supratentorial enhancing metastasis, largest in RIGHT temporal lobe measuring 35 x 48 mm. 7  mm RIGHT to LEFT midline shift without ventricular entrapment. Electronically Signed   By: Elon Alas M.D.   On: 06/23/2015 21:57    Microbiology: Recent Results (from the past 240 hour(s))  Culture, Urine     Status: None   Collection Time: 06/23/15  4:43 PM  Result Value Ref Range Status   Specimen Description URINE, CLEAN CATCH  Final   Special Requests NONE  Final   Culture   Final    MULTIPLE SPECIES PRESENT, SUGGEST RECOLLECTION Performed at [facility]    Report Status 06/25/2015 FINAL  Final  Culture, blood (routine x 2)     Status: None   Collection Time: 06/23/15  7:50 PM  Result Value Ref Range Status   Specimen Description BLOOD RIGHT HAND  Final   Special Requests BOTTLES DRAWN AEROBIC AND ANAEROBIC  5CC  Final   Culture   Final    NO GROWTH 5 DAYS Performed at [facility]    Report Status 06/28/2015 FINAL  Final  Culture, blood (routine x 2)     Status: None   Collection Time: 06/23/15  8:00 PM  Result Value Ref Range Status   Specimen Description BLOOD LEFT HAND  Final   Special Requests BOTTLES DRAWN AEROBIC AND ANAEROBIC  10CC  Final   Culture   Final    NO GROWTH 5 DAYS Performed at [facility]    Report Status 06/28/2015 FINAL  Final     Labs: Basic Metabolic  Panel:  Recent Labs Lab 06/23/15 1950  06/25/15 0445 06/26/15 0620 06/27/15 0542 06/28/15 0612 06/29/15 0626  NA  --   < > 136 135 134* 136 135  K  --   < > 4.5 4.3 4.1 4.7 4.2  CL  --   < > 101 98* 99* 98* 97*  CO2  --   < > 29 29 28 31  32  GLUCOSE  --   < > 144* 126* 129* 116* 114*  BUN  --   < > 16 23* 28* 33* 33*  CREATININE  --   < > 0.84 0.82 0.88 0.90 0.91  CALCIUM  --   < > 8.9 8.7* 8.7* 8.6* 8.6*  MG 1.6*  --  2.1  --   --   --   --   < > = values in this interval not displayed. Liver Function Tests:  Recent Labs Lab 06/23/15 1457 06/24/15 0601 06/27/15 0542  AST 15 19 14*  ALT 12* 13* 11*  ALKPHOS 54 54 44  BILITOT 0.3 0.3 0.4  PROT 6.7 6.7 5.9*  ALBUMIN 3.4* 3.3* 3.0*   No results for input(s): LIPASE, AMYLASE in the last 168 hours. No results for input(s): AMMONIA in the last 168 hours. CBC:  Recent Labs Lab 06/23/15 1335  06/25/15 0445 06/26/15 0620 06/27/15 0542 06/28/15 0612 06/29/15 0626  WBC 7.6  < > 14.3* 13.0* 12.0* 11.5* 12.4*  NEUTROABS 4.5  --   --   --   --   --   --   HGB 9.6*  < > 8.6* 8.9* 9.1* 9.4* 9.6*  HCT 31.2*  < > 28.1* 28.4* 28.8* 30.0* 30.6*  MCV 87.2  < > 85.4 86.9 85.7 85.2 83.6  PLT 277  < > 276 279 291 290 275  < > = values in this interval not displayed. Cardiac Enzymes:  Recent Labs Lab 06/23/15 1457  TROPONINI <0.03   BNP: BNP (last 3 results) No results  for input(s): BNP in the last 8760 hours.  ProBNP (last 3 results) No results for input(s): PROBNP in the last 8760 hours.  CBG:  Recent Labs Lab 06/26/15 0729 06/27/15 0732 06/28/15 0738 06/29/15 0833 06/30/15 0723  GLUCAP 122* 126* 107* 151* 153*       Signed:  Nita Sells  [facility] 06/30/2015, 8:41 AM

## 2015-06-30 NOTE — Progress Notes (Signed)
MEDICAL ONCOLOGY June 30, 2015, 8:01 AM  Hospital day 8 Antibiotics: none Chemotherapy: not begun Whole brain RT planned thru 07-18-15  Outpatient physicians: Everitt Amber, Evlyn Clines, [name]), Guadlupe Spanish, MD(PCP); Marcy Panning (pulmonary, [phone]); Ferdinand Lango, Lenin (GI, [facility]), Tyler Pita   Subjective: Generally comfortable with exception of chronic back pain and difficulty sleeping due to steroids and hospital environment. Only occasional HA now, not severe. No more falls. Tolerating present diet well, soft and chopped due to recent full dental extractions. Bowels moving adequately and without diarrhea. Swelling LLE unchanged, agrees to try thigh high Ted hose if tolerates. Breathing ok, no productive cough. No mucositis. Slept only ~ 3 hrs last pm.  I had typed letter for probation officer into EMR shortly after admission; family called [facility] about this yesterday, but should be able to print for them to give to probation officer.    ONCOLOGIC HISTORY Patient presented with >= 1 year of abdominal pain, which was evaluated variously during that time at EDs [place], [place], including CTs. Patient was post vaginal hysterectomy ~ 2005 for cervical dysplasia. In 05-2014 she was found to have gyn abnormalities on CT, referred to Dr [name]. She had pulmonary evaluation by Dr [name] prior to surgery, then exploratory laparotomy 06-03-14 at [facility] with BSO, tumor debulking and right pelvic node evaluation. Pathology [facility] [phone] from 06-03-14) bilateral ovaries and fallopian tubes with high grade serous carcinoma with clear cell changes, with tumor entirely replacing left ovary 12.1 cm maximum diameter and on right also extending into paraovarian soft tissue, extensive angiolymphatic space involvement, as well as involvement of sigmoid mesentery, posterior cul de sac, right pelvic sidewall,  omentum, 6 of 6 nodes involved, and malignant ascites. She was hospitalized again Nov 8-14, 2015 at [place], with pelvic abscess. She received first chemotherapy with weekly taxol carboplatin on 08-05-14 by Dr [name]. She was admitted 08-23-14 with respiratory symptoms and possible gastritis/ colitis, with ANC nadir 0.41 during that hospitalization. She received cycle 2 taxol carboplatin 09-02-14, then was again admitted to [facility] 09-04-14 with acute respiratory failure, requiring several days on ventilator in ICU. At visit with Dr [name] 09-22-14, pulmonary situation was not stable for resuming chemotherapy. She has been followed with observation by gyn oncology and medical oncology at [facility] since 09-2014. CT AP 05-19-15 showed progressive disease, with plan to attempt single agent carboplatin after full dental extractions by Dr Enrique Sack 06-14-15. She was admitted thru ED on 06-23-15 with imaging showing multiple brain metastases. Whole brain radiation begun 06-27-15.   Objective: Vital signs in last 24 hours: Blood pressure 126/66, pulse 78, temperature 97.8 F (36.6 C), temperature source Oral, resp. rate 16, height 5\' 2"  (1.575 m), weight 264 lb 8.8 oz (120 kg), SpO2 97 %.   Intake/Output from previous day: 11/09 0701 - 11/10 0700 In: 2690 [P.O.:2690] Out: 4402 [Urine:4400; Emesis/NG output:2] Intake/Output this shift:    Physical exam: lying supine, respirations not labored RA. No hair loss obvious. PERRL, not icteric. Oral mucosa moist and clear, gums appear to be healing well. No JVD supine. Lungs without wheezes or rales ant/ lat. PAC site ok, no IVF now. No tremors. Heart RRR. Abdomen soft, quiet, not tender. 2+ swelling LLE, trace pedal edema RLE. Moves all extremities in bed.   Lab Results:  Recent Labs  06/28/15 0612 06/29/15 0626  WBC 11.5* 12.4*  HGB 9.4* 9.6*  HCT 30.0* 30.6*  PLT 290 275  BMET  Recent Labs  06/28/15 0612 06/29/15 0626  NA 136 135   K 4.7 4.2  CL 98* 97*  CO2 31 32  GLUCOSE 116* 114*  BUN 33* 33*  CREATININE 0.90 0.91  CALCIUM 8.6* 8.6*    Studies/Results: No results found.   Assessment/Plan: 1.multiple solid and cystic brain lesions frontal, temporal, parietal, cerebellar by CT and MRI consistent with metastatic disease from known high grade serous/ clear cell ovarian cancer recently progressive in pelvis. On decadron now 4 mg q 6 hrs and whole brain radiation in process, planned thru 07-18-15. On keppra for apparent partial seizures.  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 Recurrent disease by CT AP 05-19-15. Hold chemo at least until radiation completed, then consider single agent carboplatin in palliative attempt as previously planned 3.Severe chronic lung disease with recurrent infections and previous ventilator support after cycle 2 adjuvant chemotherapy. Frequent antibiotics and steroids outpatient. Did have bronchoscopy at [facility].  4.no diarrhea since admission 5.PAC in  6.chronic pain since tractor accident in childhood: outpatient pain meds by PCP with assistance of mother. Past incarceration for drugs, letter in EMR that can be given to family for probation officer. 7.full dental extractions 06-14-15 by Dr Enrique Sack 8.family history breast and ovarian cancer in mother. I will let [place] genetics counselors know of admission. Note testing could also be done on mother as index. 9.flu vaccine done 04-21-15 10.Patient wants to complete Advance Directives. Per my conversation earlier in hospitalization, patient does not want to be maintained on life support in irreversible situation. Palliative Care has seen 11.morbid obesity, GERD, lap cholecystectomy, vaginal hysterectomy for cervical indications, hypothyroid. 12.swelling LLE: progressive. no DVT by  dopplers this admission. Have requested thigh high teds if tolerates. Likely related to malignancy in pelvis  Please page if I can help between my rounds [phone]   Thank you Gordy Levan  MD

## 2015-06-30 NOTE — Progress Notes (Addendum)
Weight and vitals stable. Reports a mild frontal headache which quickly resolves when she takes her pain medication. Reports nausea and vomiting are managed with compazine and phenergan. Reports intermittent ringing in both ears. Denies diplopia. Reports intermittent dizziness. Reports fatigue. No skin changes noted within treatment field. Taking decadron 4 mg every six hours. Patient complaining of a sore throat.Thrush noted. Patient reports she will be discharged home with her mother today. Treatment appointment schedule provided to the patient. Oriented patient to staff and routine of the clinic. Provided patient with RADIATION THERAPY AND YOU handbook then, reviewed pertinent information. Educated patient reference potential side effects and management such as fatigue, skin changes, hair loss, nausea, vomiting, and headache. Provided patient with Sonafine cream and directed upon use. Provided patient with this nurse's business card and encouraged her to call with needs. Patient verbalized understanding of all reviewed.   BP 123/71 mmHg  Pulse 81  Temp(Src) 97.8 F (36.6 C) (Oral)  Resp 16  Wt 234 lb (106.142 kg) Wt Readings from Last 3 Encounters:  06/27/15 264 lb 8.8 oz (120 kg)  06/30/15 234 lb (106.142 kg)  06/23/15 225 lb (102.059 kg)

## 2015-06-30 NOTE — Progress Notes (Signed)
Physical Therapy Treatment Patient Details Name: Debra [name] MRN: [id number] DOB: [birth year] Today's Date: 06/30/2015    History of Present Illness 44 yo female with metastatic brain cancer, has hx of ovarian cancer, GERD, depression, asthma, chronic pain, respiratory failure, hypothyroidism, and morbid obesety     PT Comments    Pt. Continues to progress with mobility; supine > sit with supervision for safety but no physical assist; SPT  EOB <> BSC with supervision for safety but no physical assist; ambulated with RW 50' x2 with one seated rest break; did not attempt stair training as patient stated she has and uses ramp to get into one story home; no LOB during ambulation but varying velocities, easily distracted with patient stopping and looking around multiple times, impulsive and anxious. Instructed DTR in guarding mother for safety during ambulation.   Follow Up Recommendations  Supervision/Assistance - 24 hour;Home health PT     Equipment Recommendations  Rolling walker with 5" wheels;Wheelchair (measurements PT);3in1 (PT)    Recommendations for Other Services       Precautions / Restrictions Precautions Precautions: Other (comment);Fall Precaution Comments: seizure  Restrictions Weight Bearing Restrictions: No    Mobility  Bed Mobility Overal bed mobility: Needs Assistance;Modified Independent (assist for safety - no physical assist) Bed Mobility: Supine to Sit     Supine to sit: Supervision;Modified independent (Device/Increase time)     General bed mobility comments: supervision for safety  Transfers Overall transfer level: Needs assistance Equipment used: Rolling walker (2 wheeled);1 person hand held assist Transfers: Sit to/from [facility] Sit to Stand: Min guard;Supervision Stand pivot transfers: Min guard;Supervision       General transfer comment: SPT to BSC <> EOB using 1 HHA and supervision/min guard for safety; STS from EOB  using RW and VC for hand placement  Ambulation/Gait Ambulation/Gait assistance: Supervision;Min guard Ambulation Distance (Feet): 100 Feet Assistive device: Rolling walker (2 wheeled) Gait Pattern/deviations: Step-through pattern;Wide base of support Gait velocity: varies    General Gait Details: pt. ambulated from EOB 50' and back with 1 seated rest break; presents with wide BOS and very impulsive/easily distracted as patient stops and looks around at things during ambulation; also impulsive as she tends to increase velocity without knowing; no LOB and no c/o pain besides throat    Stairs Stairs: Yes       General stair comments: did not perform as patient stated she had and uses ramped entrance to get into one story home  Wheelchair Mobility    Modified Rankin (Stroke Patients Only)       Balance                                    Cognition Arousal/Alertness: Awake/alert Behavior During Therapy: WFL for tasks assessed/performed;Anxious;Impulsive Overall Cognitive Status: Within Functional Limits for tasks assessed Area of Impairment: Safety/judgement         Safety/Judgement: Decreased awareness of safety     General Comments: pt moves quickly; cues for safety    Exercises      General Comments        Pertinent Vitals/Pain Pain Assessment: No/denies pain Pain Location: throat sore from radiation this AM     Home Living                      Prior Function            PT  Goals (current goals can now be found in the care plan section) Acute Rehab PT Goals Time For Goal Achievement: 07/15/15 Potential to Achieve Goals: Good Progress towards PT goals: Progressing toward goals    Frequency  Min 3X/week    PT Plan      Co-evaluation             End of Session Equipment Utilized During Treatment: Gait belt Activity Tolerance: Patient tolerated treatment well Patient left: in chair;with call bell/phone within reach;with  chair alarm set;with family/visitor present     Time:  - 11:20 - 11:45    Charges:    1 gt   1 ta                   G CodesDenna Haggard, SPTA   06/30/2015 1:26 PM   Pager: [phone]   Reviewed and agree with above Rica Koyanagi  PTA [facility]  Acute  Rehab Pager      [phone]

## 2015-07-01 ENCOUNTER — Ambulatory Visit: Payer: Medicaid Other | Admitting: Radiation Oncology

## 2015-07-01 ENCOUNTER — Ambulatory Visit
Admission: RE | Admit: 2015-07-01 | Discharge: 2015-07-01 | Disposition: A | Discharge status: Home / Self Care | Payer: Medicaid Other | Source: Ambulatory Visit | Attending: Radiation Oncology | Admitting: Radiation Oncology

## 2015-07-01 ENCOUNTER — Other Ambulatory Visit: Payer: Self-pay | Admitting: Oncology

## 2015-07-01 ENCOUNTER — Ambulatory Visit: Payer: Medicaid Other

## 2015-07-01 ENCOUNTER — Telehealth: Payer: Self-pay | Admitting: Oncology

## 2015-07-01 ENCOUNTER — Telehealth: Payer: Self-pay | Admitting: Radiation Oncology

## 2015-07-01 DIAGNOSIS — C7931 Secondary malignant neoplasm of brain: Secondary | ICD-10-CM

## 2015-07-01 DIAGNOSIS — C563 Malignant neoplasm of bilateral ovaries: Secondary | ICD-10-CM

## 2015-07-01 DIAGNOSIS — C561 Malignant neoplasm of right ovary: Secondary | ICD-10-CM

## 2015-07-01 DIAGNOSIS — C562 Malignant neoplasm of left ovary: Secondary | ICD-10-CM

## 2015-07-01 NOTE — Telephone Encounter (Addendum)
Patient mother, Suanne Marker, left message and requested return call. Suanne Marker states, "Debra [name] will not be in for treatment today because she isn't feeling well." She explained her daughter is nauseated and the medication she has aren't working. Requesting Ativan be called in.  Noted patient uses phenergan and compazine for nausea. Also, Suanne Marker reported on the voicemail her daughter's throat hurts. Aware patient has thrush related to effects of decadron 4 mg every six hours. Phoned Rhonda back at [phone], no answer. Left message.

## 2015-07-01 NOTE — Telephone Encounter (Signed)
Appointments added and patient will get appointments at d/c from hosp and or inpatient information

## 2015-07-04 ENCOUNTER — Telehealth: Payer: Self-pay | Admitting: Radiation Oncology

## 2015-07-04 ENCOUNTER — Ambulatory Visit
Admit: 2015-07-04 | Discharge: 2015-07-04 | Disposition: A | Discharge status: Home / Self Care | Payer: Medicaid Other | Attending: Radiation Oncology | Admitting: Radiation Oncology

## 2015-07-04 DIAGNOSIS — C7931 Secondary malignant neoplasm of brain: Secondary | ICD-10-CM | POA: Diagnosis present

## 2015-07-04 DIAGNOSIS — F419 Anxiety disorder, unspecified: Secondary | ICD-10-CM | POA: Diagnosis not present

## 2015-07-04 NOTE — Telephone Encounter (Signed)
Understand from Romie Jumper that the patient returned this RN's call and verbalized she does plan to present for radiation treatment today. Informed L2 of this finding.

## 2015-07-04 NOTE — Telephone Encounter (Signed)
Please phone in 1 mg Ativan, take a half TID prn anxiety, and also diflucan suspension 100 mg, daily for 10 days.

## 2015-07-04 NOTE — Telephone Encounter (Signed)
Attempted to reach patient and inquire if she plans to present for treatment. No answer at any number tried. Left messages requesting return call.

## 2015-07-05 ENCOUNTER — Telehealth: Payer: Self-pay | Admitting: Radiation Oncology

## 2015-07-05 ENCOUNTER — Ambulatory Visit
Admission: RE | Admit: 2015-07-05 | Discharge: 2015-07-05 | Disposition: A | Discharge status: Home / Self Care | Payer: Medicaid Other | Source: Ambulatory Visit | Attending: Radiation Oncology | Admitting: Radiation Oncology

## 2015-07-05 DIAGNOSIS — C7931 Secondary malignant neoplasm of brain: Secondary | ICD-10-CM | POA: Diagnosis not present

## 2015-07-05 NOTE — Telephone Encounter (Signed)
Phoned [facility], [place]. Star reports there is no tech available to take down a script thus, I would need to call back and leave it on the voicemail. Phoned back. Per Dr. Johny Shears order left message for Ativan 1 mg tablet, patient to take 1/2 tablet tid as needed for anxiety, qty 45 tablets, and no refills. Also, left message for diflucan suspension 100 mg daily for 10 days, 1000 cc, and no refills. Phoned patient's mother's cell and home. No answer at either number so I left a message that there are scripts to pick up at their pharmacy. Left same message on the patient's mobile phone.

## 2015-07-06 ENCOUNTER — Telehealth: Payer: Self-pay | Admitting: Radiation Oncology

## 2015-07-06 ENCOUNTER — Ambulatory Visit
Admission: RE | Admit: 2015-07-06 | Discharge: 2015-07-06 | Disposition: A | Discharge status: Home / Self Care | Payer: Medicaid Other | Source: Ambulatory Visit | Attending: Radiation Oncology | Admitting: Radiation Oncology

## 2015-07-06 DIAGNOSIS — C7931 Secondary malignant neoplasm of brain: Secondary | ICD-10-CM | POA: Diagnosis not present

## 2015-07-06 NOTE — Telephone Encounter (Signed)
Called to lobby. Patient's mother requesting to speak with me. She reports her daughter is nauseated today, having difficulty walking on her own and difficulty remembering things. Reinforced prior education about disease process and effects of radiation. Confirmed [facility] is providing in home support. During this conversation the patient wheeled back into the lobby from the treatment area. The patient states, "my throat is feeling much better after I drank that bottle of medication." Patient's mother confirmed her daughter drank all 100 cc of Diflucan yesterday after they picked it up from the pharmacy. Discussed need for someone other than the patient to manage her medications due to decline in patient's cognitive abilities. Mother verbalized understanding. Phoned pharmacist at [facility]. She reports the patient will process the medication over the next 14 days and not to refill it until that time frame has passed. Also, the pharmacist explained the only potential side effects could be a short term elevation of her liver enzymes and nausea. Patient is alert and of normal appearance and demeanor. Phoned Abagail Elmore and Loren Racer out of concern. They plan to reach out to the patient and her mother. Also, completed referral form to [facility] and placed in Dr. Johny Shears inbox to sign hoping they can provide an extra layer of support for the patient and her mother.

## 2015-07-07 ENCOUNTER — Ambulatory Visit
Admit: 2015-07-07 | Discharge: 2015-07-07 | Disposition: A | Discharge status: Home / Self Care | Payer: Medicaid Other | Attending: Radiation Oncology | Admitting: Radiation Oncology

## 2015-07-07 ENCOUNTER — Ambulatory Visit: Payer: Medicaid Other

## 2015-07-07 ENCOUNTER — Encounter: Payer: Medicaid Other | Admitting: Genetic Counselor

## 2015-07-07 ENCOUNTER — Telehealth: Payer: Self-pay | Admitting: Oncology

## 2015-07-07 ENCOUNTER — Other Ambulatory Visit: Payer: Medicaid Other

## 2015-07-07 DIAGNOSIS — C7931 Secondary malignant neoplasm of brain: Secondary | ICD-10-CM | POA: Diagnosis not present

## 2015-07-07 NOTE — Telephone Encounter (Signed)
pt r/s genetics appt-*gave r/s time and date & avs

## 2015-07-08 ENCOUNTER — Ambulatory Visit
Admit: 2015-07-08 | Discharge: 2015-07-08 | Disposition: A | Discharge status: Home / Self Care | Payer: Medicaid Other | Attending: Radiation Oncology | Admitting: Radiation Oncology

## 2015-07-08 ENCOUNTER — Ambulatory Visit
Admission: RE | Admit: 2015-07-08 | Discharge: 2015-07-08 | Disposition: A | Discharge status: Home / Self Care | Payer: Medicaid Other | Source: Ambulatory Visit | Attending: Radiation Oncology | Admitting: Radiation Oncology

## 2015-07-08 ENCOUNTER — Encounter: Payer: Self-pay | Admitting: *Deleted

## 2015-07-08 ENCOUNTER — Telehealth: Payer: Self-pay | Admitting: Genetic Counselor

## 2015-07-08 DIAGNOSIS — C7931 Secondary malignant neoplasm of brain: Secondary | ICD-10-CM | POA: Diagnosis not present

## 2015-07-08 NOTE — Progress Notes (Signed)
  Radiation Oncology         [phone]   Name: Debra [name] MRN: [id number]   Date: 07/08/2015  DOB: 03-31-1971     Weekly Radiation Therapy Management    ICD-9-CM ICD-10-CM   1. Brain metastasis (HCC) 198.3 C79.31     Current Dose: 22.5 Gy  Planned Dose:  35 Gy  Narrative The patient presents for routine under treatment assessment.  Currently takes 4 mg dexamethasone every 6 hours. No hair loss.  The patient is without complaint. Set-up films were reviewed. The chart was checked.  Physical Findings  Weight essentially stable at 237.9 lbs.  No significant changes. In wheelchair.  Impression The patient is tolerating radiation.  Plan Continue treatment as planned. I advised for her to taper the dexamethasone to once every 8 hours. The patient will complete treatment next week and follow up in one month. She will see Dr. Marko Plume on 11/22  We may taper decadron to every 12 hours on 11/22 depending on Dr. Ida Rogue impression.         Sheral Apley Tammi Klippel, M.D.  This document serves as a record of services personally performed by Tyler Pita, MD. It was created on his behalf by Arlyce Harman, a trained medical scribe. The creation of this record is based on the scribe's personal observations and the provider's statements to them. This document has been checked and approved by the attending provider.    d

## 2015-07-08 NOTE — Progress Notes (Signed)
[place] Work  Clinical Social Work was referred by nurse for assessment of psychosocial needs.  Clinical Social Worker had planned to meet with patient at [facility] before or after her genetics appt to offer support and assess for needs.  Pt did not show for her genetics appt. CSW phoned and left message for family.   Loren Racer, [place] Worker [place]  [place] Phone: [phone] Fax: [phone]

## 2015-07-08 NOTE — Telephone Encounter (Signed)
Debra [name] mother called to apologize for getting the appt times wrong yesterday and to verify her appt for 12/1 at 2 PM and lab at 3 PM.  We also discussed where genetic counseling was located on the 2nd floor.  She knows to check-in at the desk in the lobby.

## 2015-07-10 ENCOUNTER — Other Ambulatory Visit: Payer: Self-pay | Admitting: Oncology

## 2015-07-11 ENCOUNTER — Ambulatory Visit
Admit: 2015-07-11 | Discharge: 2015-07-11 | Disposition: A | Discharge status: Home / Self Care | Payer: Medicaid Other | Attending: Radiation Oncology | Admitting: Radiation Oncology

## 2015-07-11 DIAGNOSIS — C7931 Secondary malignant neoplasm of brain: Secondary | ICD-10-CM | POA: Diagnosis not present

## 2015-07-12 ENCOUNTER — Encounter: Payer: Self-pay | Admitting: Radiation Oncology

## 2015-07-12 ENCOUNTER — Encounter: Payer: Self-pay | Admitting: Oncology

## 2015-07-12 ENCOUNTER — Ambulatory Visit
Admission: RE | Admit: 2015-07-12 | Discharge: 2015-07-12 | Disposition: A | Discharge status: Home / Self Care | Payer: Medicaid Other | Source: Ambulatory Visit | Attending: Radiation Oncology | Admitting: Radiation Oncology

## 2015-07-12 ENCOUNTER — Ambulatory Visit
Admit: 2015-07-12 | Discharge: 2015-07-12 | Disposition: A | Discharge status: Home / Self Care | Payer: Medicaid Other | Attending: Radiation Oncology | Admitting: Radiation Oncology

## 2015-07-12 ENCOUNTER — Ambulatory Visit (HOSPITAL_BASED_OUTPATIENT_CLINIC_OR_DEPARTMENT_OTHER): Payer: Medicaid Other

## 2015-07-12 ENCOUNTER — Ambulatory Visit (HOSPITAL_BASED_OUTPATIENT_CLINIC_OR_DEPARTMENT_OTHER): Payer: Medicaid Other | Admitting: Oncology

## 2015-07-12 VITALS — BP 117/85 | HR 84 | Temp 98.9°F | Resp 18 | Ht 62.0 in | Wt 229.1 lb

## 2015-07-12 VITALS — BP 109/58 | HR 86 | Resp 16 | Wt 230.1 lb

## 2015-07-12 DIAGNOSIS — C562 Malignant neoplasm of left ovary: Secondary | ICD-10-CM

## 2015-07-12 DIAGNOSIS — K219 Gastro-esophageal reflux disease without esophagitis: Secondary | ICD-10-CM | POA: Diagnosis not present

## 2015-07-12 DIAGNOSIS — Z8041 Family history of malignant neoplasm of ovary: Secondary | ICD-10-CM | POA: Diagnosis not present

## 2015-07-12 DIAGNOSIS — C7931 Secondary malignant neoplasm of brain: Secondary | ICD-10-CM

## 2015-07-12 DIAGNOSIS — F4323 Adjustment disorder with mixed anxiety and depressed mood: Secondary | ICD-10-CM

## 2015-07-12 DIAGNOSIS — C7989 Secondary malignant neoplasm of other specified sites: Secondary | ICD-10-CM

## 2015-07-12 DIAGNOSIS — C561 Malignant neoplasm of right ovary: Secondary | ICD-10-CM

## 2015-07-12 DIAGNOSIS — Z803 Family history of malignant neoplasm of breast: Secondary | ICD-10-CM | POA: Diagnosis not present

## 2015-07-12 DIAGNOSIS — C563 Malignant neoplasm of bilateral ovaries: Secondary | ICD-10-CM

## 2015-07-12 DIAGNOSIS — Z95828 Presence of other vascular implants and grafts: Secondary | ICD-10-CM

## 2015-07-12 LAB — COMPREHENSIVE METABOLIC PANEL (CC13)
ALT: 11 U/L (ref 0–55)
AST: 8 U/L (ref 5–34)
Albumin: 3.3 g/dL — ABNORMAL LOW (ref 3.5–5.0)
Alkaline Phosphatase: 54 U/L (ref 40–150)
Anion Gap: 9 mEq/L (ref 3–11)
BUN: 18.8 mg/dL (ref 7.0–26.0)
CALCIUM: 8.8 mg/dL (ref 8.4–10.4)
CHLORIDE: 100 meq/L (ref 98–109)
CO2: 25 mEq/L (ref 22–29)
CREATININE: 0.8 mg/dL (ref 0.6–1.1)
EGFR: >90 mL/min/{1.73_m2} (ref >90)
GLUCOSE: 99 mg/dL (ref 70–140)
POTASSIUM: 3.8 meq/L (ref 3.5–5.1)
SODIUM: 134 meq/L — AB (ref 136–145)
Total Bilirubin: 0.87 mg/dL (ref 0.20–1.20)
Total Protein: 6.3 g/dL — ABNORMAL LOW (ref 6.4–8.3)

## 2015-07-12 LAB — CBC WITH DIFFERENTIAL/PLATELET
BASO%: 0 % (ref 0.0–2.0)
Basophils Absolute: 0 10*3/uL (ref 0.0–0.1)
EOS ABS: 0.1 10*3/uL (ref 0.0–0.5)
EOS%: 0.6 % (ref 0.0–7.0)
HEMATOCRIT: 35.4 % (ref 34.8–46.6)
HEMOGLOBIN: 11.1 g/dL — AB (ref 11.6–15.9)
LYMPH#: 1 10*3/uL (ref 0.9–3.3)
LYMPH%: 10.5 % — ABNORMAL LOW (ref 14.0–49.7)
MCH: 27.1 pg (ref 25.1–34.0)
MCHC: 31.4 g/dL — ABNORMAL LOW (ref 31.5–36.0)
MCV: 86.6 fL (ref 79.5–101.0)
MONO#: 0.6 10*3/uL (ref 0.1–0.9)
MONO%: 6.1 % (ref 0.0–14.0)
NEUT%: 82.8 % — ABNORMAL HIGH (ref 38.4–76.8)
NEUTROS ABS: 7.5 10*3/uL — AB (ref 1.5–6.5)
PLATELETS: 135 10*3/uL — AB (ref 145–400)
RBC: 4.09 10*6/uL (ref 3.70–5.45)
RDW: 17.9 % — AB (ref 11.2–14.5)
WBC: 9.1 10*3/uL (ref 3.9–10.3)

## 2015-07-12 NOTE — Progress Notes (Signed)
Department of Radiation Oncology  Phone:  [phone] Fax:        [phone]  Weekly Treatment Note    Name: Debra [name] Date: 07/12/2015 MRN: [id number] DOB: 10/28/1970   Current dose: 27.5 Gy  Current fraction: 11   MEDICATIONS: Current Outpatient Prescriptions  Medication Sig Dispense Refill  . albuterol (PROAIR HFA) 108 (90 BASE) MCG/ACT inhaler Inhale 2 puffs into the lungs every 4 (four) hours as needed for wheezing or shortness of breath. 18 g 0  . ALPRAZolam (XANAX) 0.5 MG tablet Take 0.5 mg by mouth 2 (two) times daily.    . benzonatate (TESSALON) 100 MG capsule Take 100 mg by mouth 3 (three) times daily as needed for cough.   3  . buPROPion (WELLBUTRIN SR) 150 MG 12 hr tablet Take 300 mg by mouth daily.   3  . citalopram (CELEXA) 40 MG tablet Take 40 mg by mouth every morning.     Marland Kitchen dexamethasone (DECADRON) 4 MG tablet Take 1 tablet (4 mg total) by mouth every 6 (six) hours. (Patient taking differently: Take 4 mg by mouth 2 (two) times daily. ) 60 tablet 0  . Dextromethorphan-Guaifenesin (MUCINEX DM) 30-600 MG TB12 Take 2 tablets by mouth every 6 (six) hours as needed (for cough/congestion).    . fluconazole (DIFLUCAN) 10 MG/ML suspension TAKE 10ML (100MG ) DAILY FOR TEN DAYS  0  . Fluticasone-Salmeterol (ADVAIR) 500-50 MCG/DOSE AEPB Inhale 2 puffs into the lungs 2 (two) times daily.     . furosemide (LASIX) 40 MG tablet Take 40 mg by mouth daily.    Marland Kitchen ipratropium-albuterol (DUONEB) 0.5-2.5 (3) MG/3ML SOLN Take 3 mLs by nebulization every 6 (six) hours as needed (for wheezing. Take three times daily for  next 3 days). 360 mL 0  . levETIRAcetam (KEPPRA) 500 MG tablet Take 1 tablet (500 mg total) by mouth 2 (two) times daily. 60 tablet 0  . levothyroxine (SYNTHROID, LEVOTHROID) 175 MCG tablet Take 175 mcg by mouth daily before breakfast.    . lidocaine-prilocaine (EMLA) cream Apply 1 application topically daily as needed.    Marland Kitchen LORazepam (ATIVAN) 0.5 MG tablet Take  0.5 mg by mouth 3 (three) times daily.    . montelukast (SINGULAIR) 10 MG tablet Take 10 mg by mouth at bedtime.     Marland Kitchen morphine (MS CONTIN) 30 MG 12 hr tablet Take 30 mg by mouth 2 (two) times daily.  0  . morphine (MSIR) 15 MG tablet Take 15 mg by mouth 2 (two) times daily.  0  . omeprazole (PRILOSEC) 40 MG capsule Take 40 mg by mouth 2 (two) times daily.     . OXYGEN Inhale 3 L/min into the lungs every evening.     . prochlorperazine (COMPAZINE) 10 MG tablet Take 1 tablet (10 mg total) by mouth every 6 (six) hours as needed for nausea or vomiting. 30 tablet 1  . promethazine (PHENERGAN) 25 MG tablet Take 1 tablet (25 mg total) by mouth every 6 (six) hours as needed for nausea or vomiting. 30 tablet 1  . sorbitol 70 % SOLN Take 30 mLs by mouth daily as needed for moderate constipation. 473 mL 0  . Tiotropium Bromide Monohydrate (SPIRIVA RESPIMAT) 2.5 MCG/ACT AERS Inhale 2 puffs into the lungs 2 (two) times daily.    . traZODone (DESYREL) 100 MG tablet Take 100 mg by mouth at bedtime as needed for sleep.     . Wound Dressings (SONAFINE) Apply 1 application topically 3 (three) times daily.  No current facility-administered medications for this encounter.     ALLERGIES: Zofran; Penicillins; and Sulfamethoxazole-trimethoprim   LABORATORY DATA:  Lab Results  Component Value Date   WBC 9.1 07/12/2015   HGB 11.1* 07/12/2015   HCT 35.4 07/12/2015   MCV 86.6 07/12/2015   PLT 135* 07/12/2015   Lab Results  Component Value Date   NA 134* 07/12/2015   K 3.8 07/12/2015   CL 97* 06/29/2015   CO2 25 07/12/2015   Lab Results  Component Value Date   ALT 11 07/12/2015   AST 8 07/12/2015   ALKPHOS 54 07/12/2015   BILITOT 0.87 07/12/2015     NARRATIVE: Debra [name] was seen today for weekly treatment management. The chart was checked and the patient's films were reviewed.  Weight and vitals stable. Reports daily manageable headaches. Continues decadron 4 mg bid. Debra [name] has resolved.  Reports appetite has increased. Denies nausea, vomiting or seizure activity. Faint hyperpigmentation of forehead noted. Encouraged patient to begin using Sonafine cream as directed. Patient and mother verbalized understanding. One month follow up appointment card given.   BP 109/58 mmHg  Pulse 86  Resp 16  Wt 230 lb 1.6 oz (104.373 kg)  SpO2 100% Wt Readings from Last 3 Encounters:  07/12/15 230 lb 1.6 oz (104.373 kg)  07/12/15 229 lb 1.6 oz (103.919 kg)  06/27/15 264 lb 8.8 oz (120 kg)     PHYSICAL EXAMINATION: weight is 230 lb 1.6 oz (104.373 kg). Her blood pressure is 109/58 and her pulse is 86. Her respiration is 16 and oxygen saturation is 100%.      no thrush present  ASSESSMENT: The patient is doing satisfactorily with treatment.  PLAN: We will continue with the patient's radiation treatment as planned.

## 2015-07-12 NOTE — Progress Notes (Addendum)
Weight and vitals stable. Reports daily manageable headaches. Continues decadron 4 mg bid. [name] has resolved. Reports appetite has increased. Denies nausea, vomiting or seizure activity. Faint hyperpigmentation of forehead noted. Encouraged patient to begin using Sonafine cream as directed. Patient and mother verbalized understanding. One month follow up appointment card given.   BP 109/58 mmHg  Pulse 86  Resp 16  Wt 230 lb 1.6 oz (104.373 kg)  SpO2 100% Wt Readings from Last 3 Encounters:  07/12/15 230 lb 1.6 oz (104.373 kg)  07/12/15 229 lb 1.6 oz (103.919 kg)  06/27/15 264 lb 8.8 oz (120 kg)

## 2015-07-12 NOTE — Progress Notes (Signed)
OFFICE PROGRESS NOTE   July 12, 2015   Physicians:Emma Clabe Seal Algis Greenhouse, [name]), Guadlupe Spanish, MD(PCP); Marcy Panning (pulmonary, [phone]); Virgel Bouquet (GI, [facility])  INTERVAL HISTORY:  Patient is seen, together with mother and daughter, continuing cranial radiation by Dr Tammi Klippel for multiple brain mets which were found 06-23-15. Radiation is planned thru 07-19-15. No additional chemotherapy has begun; will decide after radiation if this will be tried.  Patient continues to taper decadron per Dr Tammi Klippel, down to 4 mg bid last 2 days. She had HA this AM, not as severe as at presentation with brain mets; she and mother are instructed to let MD know if HA continue as decadron is tapered. She is tolerating radiation well overall, tho is very fatigued and some scalp soreness as she is losing her hair. [facility] is assisting, trying to get WC, does have walker. She has not fallen at home and has not had obvious seizures since on Keppra. She has had no worsening of respiratory problems, including no productive cough now. Appetite is improved. Bowels are moving without diarrhea. LLE swelling is some better. She is able to sleep well. She denies epigastric discomfort. She has started diflucan liquid for (?) oral and vaginal yeast, per Dr Tammi Klippel.  She has not seen Dr Holley Raring or Dr [name] since hospital DC, has appointment with Dr Holley Raring next week.   Remainder of 10 point Review of Systems unchanged/ negative.   PAC in, flushed with hospital DC 06-30-15 Genetics testing planned12-1 to be rescheduled to coordinate with other appointments. Note mother had breast cancer.  Flu vaccine done 04-21-15    ONCOLOGIC HISTORY  Patient presented 2015 with >= 1 year of abdominal pain, which was evaluated variously during that time at EDs [place], [place], including CTs. Patient was post vaginal hysterectomy ~ 2005 for cervical  dysplasia. In 05-2014 she was found to have gyn abnormalities on CT, referred to Dr [name]. She had pulmonary evaluation by Dr [name] prior to surgery, then exploratory laparotomy 06-03-14 at [facility] with BSO, tumor debulking and right pelvic node evaluation. 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 She was hospitalized again Nov 8-14, 2015 at [place], with pelvic abscess. She received first chemotherapy with weekly taxol carboplatin on 08-05-14 by Dr [name]. She was admitted 08-23-14 with respiratory symptoms and possible gastritis/ colitis, with ANC nadir 0.41 during that hospitalization. She received cycle 2 taxol carboplatin 09-02-14, then was again admitted to [facility] 09-04-14 with acute respiratory failure, requiring several days on ventilator in ICU. At visit with Dr [name] 09-22-14, pulmonary situation was not stable for resuming chemotherapy. She has been followed with observation by gyn oncology and medical oncology at [facility] since 09-2014. CT AP 05-19-15 showed progressive disease, with plan to attempt single agent carboplatin after full dental extractions done  06-14-15, however she presented with symptomatic multiple brain mets in early Nov 2016    Objective:  Vital signs in last 24 hours:  BP 117/85 mmHg  Pulse 84  Temp(Src) 98.9 F (37.2 C) (Oral)  Resp 18  Ht _0  (1.575 m)  Wt 229 lb 1.6 oz (103.919 kg)  BMI 41.89 kg/m2  SpO2 98%  Alert, oriented and appropriate conversation, speech fluent. Seated in WC, looks comfortable. Family  very supportive. Partial alopecia  HEENT:PERRL, sclerae not icteric. Oral mucosa moist, tongue slightly coated without lesions on buccan mucosa or palate, posterior pharynx clear. Gums appear healed from recent full dental extractions. Neck supple. No JVD.  Lymphatics:no cervical,supraclavicular adenopathy Resp: diminished BS thruout otherwise clear to auscultation bilaterally and no dullnes to percussion bilaterally Cardio: regular rate and rhythm. No gallop. GI: abdomen obese, soft, nontender, not distended. Some bowel sounds.  Musculoskeletal/ Extremities: 1+ swelling LLE, trace on right without cords, tenderness Neuro: speech fluent, moves all extremities easily in WC, seated balance good, no tremors.  Skin without rash, ecchymosis, petechiae Portacath-without erythema or tenderness  Lab Results:  Results for orders placed or performed in visit on 07/12/15  CBC with Differential  Result Value Ref Range   WBC 9.1 3.9 - 10.3 10e3/uL   NEUT# 7.5 (H) 1.5 - 6.5 10e3/uL   HGB 11.1 (L) 11.6 - 15.9 g/dL   HCT 35.4 34.8 - 46.6 %   Platelets 135 (L) 145 - 400 10e3/uL   MCV 86.6 79.5 - 101.0 fL   MCH 27.1 25.1 - 34.0 pg   MCHC 31.4 (L) 31.5 - 36.0 g/dL   RBC 4.09 3.70 - 5.45 10e6/uL   RDW 17.9 (H) 11.2 - 14.5 %   lymph# 1.0 0.9 - 3.3 10e3/uL   MONO# 0.6 0.1 - 0.9 10e3/uL   Eosinophils Absolute 0.1 0.0 - 0.5 10e3/uL   Basophils Absolute 0.0 0.0 - 0.1 10e3/uL   NEUT% 82.8 (H) 38.4 - 76.8 %   LYMPH% 10.5 (L) 14.0 - 49.7 %   MONO% 6.1 0.0 - 14.0 %   EOS% 0.6 0.0 - 7.0 %   BASO% 0.0 0.0 - 2.0 %    CMET available after visit normal with exception of Na 134, Tprot 6.3 and alb 3.3, including creatinine 0.8, glucose 99, normal LFTs    Studies/Results: Most recent imaging was CT head/ MRI head and CXR 06-23-15  Medications: I have reviewed the patient's current medications. Steroid taper per Dr Tammi Klippel. Continue Keppra  DISCUSSION Interval history reviewed and situation discussed  brieflly due to timing of radiation treatment this AM. I will see her back in ~ 2 weeks with labs and they know to call prior if needed.   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 Recent progressive disease in abdomen pelvis, then multiple brain mets apparent in early Nov. Whole brain radiation in process, on tapering steroids. Doubt appropriate to attempt chemotherapy after brain radiation, but have not discussed further today or made final decision.  2.recurrent pneumonias: multiple hospitalizations in past 1-2 years, intubated x several days at [facility] in Jan 2016. Dr [name] now following closely for pulmonary, several courses of antibiotics and steroids in last 3-4 months. 3.asthma since childhood. Minimal past tobacco 4.PAC in. Slightly tilted, sometimes difficult to access. Flushed ~ time of hospital DC 06-30-15 5.chronic pain since tractor accident in childhood: all pain medication by PCP under pain management contract 6.family history of ovarian and breast cancer in mother. They are interested in genetics counseling, but need to reschedule due to  other situation today 7.post laparoscopic cholecystectomy, vaginal hysterectomy for cervical indications, knee surgeries, C section 8.hypothyroid x ~ 20 years, on replacement by PCP, recently increased 9. Post full dental extraction in Oct. 10. Reported ulcerative colitis 2000, tho I am not sure of that diagnosis 11.morbid obesity, BMI 42.9. Multifactorial, including necessary steroids. 12.long standing GERD. Colonoscopy done Dec 2015 13.past incarceration for drugs 14.flu vaccine done 04-21-15 15.Patient and family met with  Palliative Care in hospital, mother to be HCPOA. information on [facility] Direct[facility] given. She was still listed as full code in hospital, tho she clearly expressed that she did not want to be maintained on life support in irreversible situation 16.intolerance to zofran causing HA  Time spent 20 min including >50% counseling and coordination of care, abbreviated due to timing with RT.  Cc Dr Holley Raring, Dr Pura Spice, MD   07/12/2015, 10:09 AM

## 2015-07-13 ENCOUNTER — Telehealth: Payer: Self-pay | Admitting: Oncology

## 2015-07-13 ENCOUNTER — Ambulatory Visit: Admission: RE | Admit: 2015-07-13 | Payer: Medicaid Other | Source: Ambulatory Visit

## 2015-07-13 ENCOUNTER — Ambulatory Visit: Payer: Medicaid Other

## 2015-07-13 NOTE — Telephone Encounter (Signed)
Spoke with patient and she is aware of her 12/19 apointments

## 2015-07-18 ENCOUNTER — Ambulatory Visit: Payer: Medicaid Other

## 2015-07-18 ENCOUNTER — Telehealth: Payer: Self-pay | Admitting: Oncology

## 2015-07-18 ENCOUNTER — Ambulatory Visit
Admission: RE | Admit: 2015-07-18 | Discharge: 2015-07-18 | Disposition: A | Discharge status: Home / Self Care | Payer: Medicaid Other | Source: Ambulatory Visit | Attending: Radiation Oncology | Admitting: Radiation Oncology

## 2015-07-18 ENCOUNTER — Encounter (HOSPITAL_COMMUNITY): Payer: Self-pay | Admitting: Dentistry

## 2015-07-18 ENCOUNTER — Telehealth: Payer: Self-pay | Admitting: *Deleted

## 2015-07-18 ENCOUNTER — Ambulatory Visit (HOSPITAL_COMMUNITY): Payer: Medicaid - Dental | Admitting: Dentistry

## 2015-07-18 VITALS — BP 129/79 | HR 90 | Temp 98.7°F

## 2015-07-18 DIAGNOSIS — Z463 Encounter for fitting and adjustment of dental prosthetic device: Secondary | ICD-10-CM

## 2015-07-18 DIAGNOSIS — C563 Malignant neoplasm of bilateral ovaries: Secondary | ICD-10-CM

## 2015-07-18 DIAGNOSIS — K0820 Unspecified atrophy of edentulous alveolar ridge: Secondary | ICD-10-CM

## 2015-07-18 DIAGNOSIS — C561 Malignant neoplasm of right ovary: Secondary | ICD-10-CM

## 2015-07-18 DIAGNOSIS — C7931 Secondary malignant neoplasm of brain: Secondary | ICD-10-CM | POA: Diagnosis not present

## 2015-07-18 DIAGNOSIS — C562 Malignant neoplasm of left ovary: Secondary | ICD-10-CM

## 2015-07-18 DIAGNOSIS — K08109 Complete loss of teeth, unspecified cause, unspecified class: Secondary | ICD-10-CM

## 2015-07-18 NOTE — Telephone Encounter (Signed)
FAXED NOTES AND HOSPITAL D/C TO DR. [name]

## 2015-07-18 NOTE — Patient Instructions (Signed)
Return to clinic as scheduled for continued upper and lower complete denture fabrication. Dr. Margarita Bobrowski 

## 2015-07-18 NOTE — Progress Notes (Signed)
07/18/2015  Patient Name:   Debra [name] Date of Birth:   March 04, 1971 Medical Record Number: [id number]  BP 129/79 mmHg  Pulse 90  Temp(Src) 98.7 F (37.1 C) (Oral)   Dareen Piano presents for start of upper and lower denture fabrication.  Exam: Patient is edentulous. There is atrophy of edentulous alveolar ridges. The patient has less than ideal healing at that time but wishes to proceed with start of upper and lower complete denture fabrication today secondary to brain metastases. Discussed procedures involved in upper and lower denture fabrication and less than ideal prognosis for successful ability to wear dentures. Price for dentures confirmed.  Patient agrees to proceed with upper and lower denture fabrication. Procedure:  Upper and lower denture primary impressions in alginate. Lab pour. To [employer] for upper and lower denture custom tray fabrication. RTC for upper and lower denture final impressions.  Lenn Cal, DDS

## 2015-07-18 NOTE — Telephone Encounter (Signed)
Returned patient's mom phone call, lvm for a return call

## 2015-07-19 ENCOUNTER — Ambulatory Visit: Payer: Medicaid Other

## 2015-07-19 ENCOUNTER — Telehealth: Payer: Self-pay | Admitting: Radiation Oncology

## 2015-07-19 NOTE — Telephone Encounter (Signed)
Understand from Youlanda Roys, RT that this patient cancelled radiation treatment appointment for today. Phoned patient to inquire. Patient states, "I ate a bunch of rich food over the holiday and my ulcerative colitis is acting up." Patient reports diarrhea is hindering her from presenting for treatment today. Patient plans to present tomorrow at 1405 for treatment and finished up radiation this week. Patient confirms she continues to take decadron 4 mg bid. Patient denies any additional needs at this time and expresses appreciation for the call.

## 2015-07-20 ENCOUNTER — Telehealth: Payer: Self-pay | Admitting: Radiation Oncology

## 2015-07-20 ENCOUNTER — Ambulatory Visit: Payer: Medicaid Other

## 2015-07-20 ENCOUNTER — Ambulatory Visit
Admission: RE | Admit: 2015-07-20 | Discharge: 2015-07-20 | Disposition: A | Discharge status: Home / Self Care | Payer: Medicaid Other | Source: Ambulatory Visit | Attending: Radiation Oncology | Admitting: Radiation Oncology

## 2015-07-20 ENCOUNTER — Other Ambulatory Visit: Payer: Self-pay | Admitting: Radiation Oncology

## 2015-07-20 DIAGNOSIS — C7931 Secondary malignant neoplasm of brain: Secondary | ICD-10-CM

## 2015-07-20 MED ORDER — LEVETIRACETAM 500 MG PO TABS: 500.0000 mg | ORAL_TABLET | Freq: Two times a day (BID) | ORAL | Status: DC

## 2015-07-20 NOTE — Telephone Encounter (Signed)
Received voicemail message from patient requesting Keppra refill. Patient stated on voicemail, "I have been out of my keppra for a week." Phoned patient's home and cell. No answer. Left message that voicemail was received and request was forwarded to Dr. Tammi Klippel.

## 2015-07-21 ENCOUNTER — Other Ambulatory Visit: Payer: Medicaid Other

## 2015-07-21 ENCOUNTER — Encounter: Payer: Self-pay | Admitting: *Deleted

## 2015-07-21 ENCOUNTER — Encounter (HOSPITAL_COMMUNITY): Payer: Self-pay | Admitting: Dentistry

## 2015-07-21 ENCOUNTER — Ambulatory Visit
Admission: RE | Admit: 2015-07-21 | Discharge: 2015-07-21 | Disposition: A | Discharge status: Home / Self Care | Payer: Medicaid Other | Source: Ambulatory Visit | Attending: Radiation Oncology | Admitting: Radiation Oncology

## 2015-07-21 ENCOUNTER — Encounter: Payer: Self-pay | Admitting: Radiation Oncology

## 2015-07-21 ENCOUNTER — Ambulatory Visit: Payer: Medicaid Other

## 2015-07-21 ENCOUNTER — Encounter: Payer: Self-pay | Admitting: Genetic Counselor

## 2015-07-21 VITALS — BP 117/65 | HR 92 | Resp 16

## 2015-07-21 DIAGNOSIS — C7931 Secondary malignant neoplasm of brain: Secondary | ICD-10-CM | POA: Diagnosis not present

## 2015-07-21 MED ORDER — DEXAMETHASONE 4 MG PO TABS: 4.0000 mg | ORAL_TABLET | Freq: Two times a day (BID) | ORAL | Status: AC

## 2015-07-21 MED ORDER — LORAZEPAM 0.5 MG PO TABS: 0.5000 mg | ORAL_TABLET | Freq: Three times a day (TID) | ORAL | Status: DC

## 2015-07-21 NOTE — Progress Notes (Addendum)
Vital stable. Denies pain. Reports intermittent headaches. Explains at times her headaches are severe and can wake her from sleep. Unsteady gait reported. Reports her knees buckled this morning and she fell. Abrasions noted on both her knees. Hand tremors continue. Reports diarrhea related to colitis is less. Reports taking decadron 4 mg bid. No thrush noted. Denies nausea, vomiting or diplopia. Reports ringing in her ears. One month follow up appointment card given.   BP 117/65 mmHg  Pulse 92  Resp 16  SpO2 100% Wt Readings from Last 3 Encounters:  07/12/15 230 lb 1.6 oz (104.373 kg)  07/12/15 229 lb 1.6 oz (103.919 kg)  06/27/15 264 lb 8.8 oz (120 kg)

## 2015-07-21 NOTE — Progress Notes (Signed)
[place] Work  Clinical Social Work was referred by radiation oncology RN for assessment of psychosocial needs.  Clinical Social Worker attempted to contact patient/patient's mother by phone to offer support and assess for needs.  CSW left voicemail requesting patient/family return call when convenient.    Polo Riley, MSW, LCSW, OSW-C Clinical Social Worker [facility] [phone]

## 2015-07-21 NOTE — Progress Notes (Signed)
  Radiation Oncology         [id number]   Name: Debra [name] MRN: [id number]   Date: 07/21/2015  DOB: 1970/10/11     Weekly Radiation Therapy Management    ICD-9-CM ICD-10-CM   1. Brain metastasis (HCC) 198.3 C79.31     Current Dose: 35 Gy  Planned Dose:  35 Gy  Narrative The patient presents for routine under treatment assessment.  Vital stable. Denies pain. Reports intermittent headaches. Explains at times her headaches are severe and can wake her from sleep. Unsteady gait reported. Reports her knees buckled this morning and she fell. Abrasions noted on both her knees. Hand tremors continue. Reports diarrhea related to colitis is less. Reports taking decadron 4 mg bid. No thrush noted. Denies nausea, vomiting or diplopia. Reports ringing in her ears. One month follow up appointment card given. She states she has occasional headaches. She has not had a seizure since she was in the hospital.  The patient is without complaint. Set-up films were reviewed. The chart was checked.  Physical Findings  Weight essentially stable at 237.9 lbs.  No significant changes. In wheelchair.  BP 117/65 mmHg  Pulse 92  Resp 16  SpO2 100%  Wt Readings from Last 3 Encounters:   07/12/15  230 lb 1.6 oz (104.373 kg)   07/12/15  229 lb 1.6 oz (103.919 kg)   06/27/15  264 lb 8.8 oz (120 kg)     Impression The patient is tolerating radiation.  Plan She has a follow up appointment 09/18/15. She will schedule an MRI in the next 6-8 weeks. I recommend she tapers her steroid medication to 2 mg twice a day in a week and then 2 mg daily.           Sheral Apley Tammi Klippel, M.D.  This document serves as a record of services personally performed by Tyler Pita, MD. It was created on his behalf by Lendon Collar, a trained medical scribe. The creation of this record is based on the scribe's personal observations and the provider's statements to them. This document has been checked and approved by the  attending provider.

## 2015-07-22 ENCOUNTER — Encounter: Payer: Self-pay | Admitting: *Deleted

## 2015-07-22 ENCOUNTER — Telehealth: Payer: Self-pay | Admitting: Radiation Oncology

## 2015-07-22 NOTE — Telephone Encounter (Signed)
Received refill request for patient's keppra. Script escribed 07/20/2015. Spoke with [place] and she confirms receipt of script. Also, she confirms medication is ready for pick up by the patient.

## 2015-07-22 NOTE — Progress Notes (Signed)
[place] Work  Clinical Social Work was referred by radiation oncology RN for assessment of psychosocial needs.  Clinical Social Worker contacted patient's mother by phoneto offer support and assess for needs. Patient was asleep during time of call.  Patient's mother reported patient has had multiple falls but she has supervision and support provided by mother and daughter.  Patient's mother shared main concern is paying for gas to get to doctor appointments.  CSW provided information on Cancer Care and plans to meet with patient at upcoming medical oncology appointment.      Polo Riley, MSW, LCSW, OSW-C Clinical Social Worker [facility] [phone]

## 2015-07-27 ENCOUNTER — Other Ambulatory Visit: Payer: Self-pay

## 2015-07-27 DIAGNOSIS — C569 Malignant neoplasm of unspecified ovary: Secondary | ICD-10-CM

## 2015-07-27 MED ORDER — PROCHLORPERAZINE MALEATE 10 MG PO TABS: 10.0000 mg | ORAL_TABLET | Freq: Four times a day (QID) | ORAL | Status: DC | PRN

## 2015-07-28 ENCOUNTER — Ambulatory Visit (HOSPITAL_COMMUNITY): Payer: Medicaid - Dental | Admitting: Dentistry

## 2015-07-28 ENCOUNTER — Encounter (HOSPITAL_COMMUNITY): Payer: Self-pay | Admitting: Dentistry

## 2015-07-28 ENCOUNTER — Encounter (INDEPENDENT_AMBULATORY_CARE_PROVIDER_SITE_OTHER): Payer: Self-pay

## 2015-07-28 VITALS — BP 119/98 | HR 81 | Temp 98.0°F

## 2015-07-28 DIAGNOSIS — K0820 Unspecified atrophy of edentulous alveolar ridge: Secondary | ICD-10-CM

## 2015-07-28 DIAGNOSIS — K08109 Complete loss of teeth, unspecified cause, unspecified class: Secondary | ICD-10-CM

## 2015-07-28 DIAGNOSIS — C561 Malignant neoplasm of right ovary: Secondary | ICD-10-CM

## 2015-07-28 DIAGNOSIS — C563 Malignant neoplasm of bilateral ovaries: Secondary | ICD-10-CM

## 2015-07-28 DIAGNOSIS — C7931 Secondary malignant neoplasm of brain: Secondary | ICD-10-CM

## 2015-07-28 DIAGNOSIS — Z463 Encounter for fitting and adjustment of dental prosthetic device: Secondary | ICD-10-CM

## 2015-07-28 DIAGNOSIS — C562 Malignant neoplasm of left ovary: Secondary | ICD-10-CM

## 2015-07-28 NOTE — Progress Notes (Signed)
07/28/2015  Patient Name:   Debra [name] Date of Birth:   1971/07/06 Medical Record Number: [id number]  BP 119/98 mmHg  Pulse 81  Temp(Src) 98 F (36.7 C) (Oral)  Dareen Piano presents for continued upper and lower complete denture fabrication.  Procedure:  Upper and lower border molding and final impressions in Aquasil. Patient tolerated procedure well. To [employer] for custom baseplates with rims. Return to clinic for upper and lower complete denture jaw relations.  Lenn Cal, DDS

## 2015-07-28 NOTE — Patient Instructions (Signed)
Return to clinic as scheduled for continued upper and lower complete denture fabrication. Dr. Kulinski 

## 2015-07-29 ENCOUNTER — Telehealth: Payer: Self-pay | Admitting: Radiation Oncology

## 2015-07-29 DIAGNOSIS — F4323 Adjustment disorder with mixed anxiety and depressed mood: Secondary | ICD-10-CM

## 2015-07-29 DIAGNOSIS — C7931 Secondary malignant neoplasm of brain: Secondary | ICD-10-CM

## 2015-07-29 DIAGNOSIS — C561 Malignant neoplasm of right ovary: Secondary | ICD-10-CM

## 2015-07-29 DIAGNOSIS — C562 Malignant neoplasm of left ovary: Principal | ICD-10-CM

## 2015-07-29 DIAGNOSIS — C563 Malignant neoplasm of bilateral ovaries: Secondary | ICD-10-CM

## 2015-07-29 DIAGNOSIS — B370 Candidal stomatitis: Secondary | ICD-10-CM

## 2015-07-29 NOTE — Telephone Encounter (Signed)
Received voicemail message from patient. Returned call. Spoke with patient's mother. Confirmed the patient should be taking decadron 2 mg bid presently then, after seven days to taper down as directed. Explained Keppra dose remains the same as well as frequency. [name], patient's mother, verbalized understanding. [name] reports the patient is almost out of her ativan and needs a refill. Lastly, she questions if Dr. Tammi Klippel would consider prescribing Diflucan once more to help resolve the thrush in her daughter's mouth. Explained these request would be forwarded onto Dr. Tammi Klippel and this RN will call them back once refills are complete. Understanding verbalized and appreciation for call expressed.

## 2015-07-31 MED ORDER — ALPRAZOLAM 0.5 MG PO TABS: 0.5000 mg | ORAL_TABLET | Freq: Three times a day (TID) | ORAL | Status: DC | PRN

## 2015-07-31 NOTE — Addendum Note (Signed)
Addended by: Tyler Pita on: 07/31/2015 02:26 PM   Modules accepted: Orders

## 2015-08-01 ENCOUNTER — Telehealth: Payer: Self-pay | Admitting: Radiation Oncology

## 2015-08-01 ENCOUNTER — Telehealth: Payer: Self-pay

## 2015-08-01 NOTE — Telephone Encounter (Signed)
Lymphedema in L foot worse in the last 3 days, not going down with elevation. Feet are cracked but not weeping. They are wrapped in ACE bandage for last 3 days. Is using MS contin, and MSIR for throbbing pain. Pt does not have support hose, pt does not have lymphedema clinic. Pt now has S[employer] insurance. Palliative RN is visiting tomorrow AM. Hands are swelling to and face. SOB has not increased.

## 2015-08-01 NOTE — Telephone Encounter (Signed)
Per Dr. Johny Shears order called in Xanax 0.5 mg tablet, one tablet by mouth three times daily as needed for anxiety, qty 60 and no refills to [facility]. Phoned patient's home. Spoke with her mother, [name]. Explained xanax was called in to [place]. She verbalized understanding. She reports her her daughter's feet are swollen. Encouraged her to set up an appointment with her PCP to discuss adjusting her Lasix. [name] verbalized understanding and expressed appreciation for the call.

## 2015-08-02 NOTE — Telephone Encounter (Signed)
Palliative Care NP. Josh made a visit today.  Phone: [phone].  He feels Ms. Griffing ;needs a wheel chair and compression stockings. The swelling  and SOB is no worse then on 08-01-15.   Ms. Struthers's mother called PCP  Dr.Arvind [phone]) to see if lasix dose needed  oe be adjusted.  Pt is on 40 mg of lasix a day.  Mother [name] stated that she did not receive a return call from Dr. Lindwood Qua office.   Decadron taper dose is at 2 mg bid.  Ms. Kopplin fell today and scrapped up her knees.  Her daughter was with her at the time.  She was going up cement steps  at a friend's home. The decadron 2 mg bid began Wednesday 07-27-15. X 7 days then 2 mg daily.  Inquired if Ms. Sylvan' balance /gate is worse since the taper to current dose?  Mother [name] stated that Saturday/Sunday she noticed that her balance was worse. Ms. Feasel can not lean forward or bend forward if sitting or she will fall forward. Ms. Irene Limbo took an extra 40 mg of lasix today = 80 mg  this am.  Ms. Jeffery has had more urine output today but no change in swelling thus far.   Told mother [name] that this nurse will call Palliative care NP. Josh and see what the plans are for her care and discuss with Dr. Marko Plume and call her back with plan of care. Mother appreciated the follow up call.

## 2015-08-03 ENCOUNTER — Telehealth: Payer: Self-pay | Admitting: Radiation Oncology

## 2015-08-03 NOTE — Telephone Encounter (Signed)
Reached Debra Blanks, NP. His concern from visit was the swelling in the left leg being greater then the right.  Debra [name] wants compression stockings.  Debra [name] does not know stocking could be put on the left leg with current edema.  Last doppler was 06-24-15 and negative for DVT.  Debra [name] also wants a wheel chair.  Debra [name] called [facility] and a practitioner needs to complete orders.  This paper work was sent to Debra [name] but no forms have been sent back  to [name]. Debra [name] was not SOB.  She was very engaged in conversation. Performance status was good considering the brain mets. He will see her once a month.  He is limited with resources for patients since he is not Hospice Care. Attempted to reach Debra [name] or mother to see if Debra [name] responded back about adjusting  the lasix dose.  Not able to reach them.  Home line busy. Debra [name] has appointment with Debra [name] on 08-08-15. Will follow up with Debra [name] tomorrow.

## 2015-08-03 NOTE — Telephone Encounter (Signed)
Patient left voicemail message requesting this RN call her at [phone]. Called patient back. No answer. Left message explaining that this RN spoke with her mother yesterday and advised an appointment be arranged with her PCP to adjust lasix/manage lower extremity edema related to decadron. Also, encouraged her to call with future needs.

## 2015-08-04 ENCOUNTER — Telehealth: Payer: Self-pay | Admitting: Oncology

## 2015-08-04 ENCOUNTER — Other Ambulatory Visit: Payer: Self-pay | Admitting: Oncology

## 2015-08-04 DIAGNOSIS — C569 Malignant neoplasm of unspecified ovary: Secondary | ICD-10-CM

## 2015-08-04 NOTE — Telephone Encounter (Signed)
Medical Oncology  Phone updates from NP J.Boarders with Palliative Care [place], to let us know of increased swelling LE and fall at home. Concern that Palliative NP cannot see her frequently. Cranial radiation is completed.  Decadron on taper. Needs repeat LE venous dopplers, last negative 06-24-15.  I spoke with [facility], as not clear to me why they were not involved; [facility] does allow palliative radiation under their care.  Seems likely that referral from hospital was to Palliative only. Referral made now to Hospice.  L.Bernardina Cacho MD

## 2015-08-05 NOTE — Telephone Encounter (Addendum)
Spoke with Ms. Giraud's mother [name] and told her that Dr. Marko Plume wanted to check her legs for blood clots since the swelling is continuing and last doppler study was 06-24-15. Rhonda cannot bring [place] today for the study.  Scheduled the doppler for 11 am on 08-08-15 at [facility].  She needs to register in admitting at 1045. They can get a bite to eat prior to genetics at 1 pm and then continue with lab and Dr. Mariana Kaufman visit.  Ms. Ellington did get thigh high support hose(payed out of pocket) at nearby family medical supply/pharmacy.  Suanne Marker stated that she and [place]  would like to discuss prognosis and treatment plan with Dr. Marko Plume on 08-08-15 before deciding on Hospice services.

## 2015-08-07 ENCOUNTER — Encounter (HOSPITAL_BASED_OUTPATIENT_CLINIC_OR_DEPARTMENT_OTHER): Payer: Self-pay | Admitting: Emergency Medicine

## 2015-08-07 ENCOUNTER — Other Ambulatory Visit: Payer: Self-pay | Admitting: Oncology

## 2015-08-07 ENCOUNTER — Emergency Department (HOSPITAL_BASED_OUTPATIENT_CLINIC_OR_DEPARTMENT_OTHER)
Admission: EM | Admit: 2015-08-07 | Discharge: 2015-08-07 | Disposition: A | Discharge status: Home / Self Care | Payer: Medicaid Other | Attending: Emergency Medicine | Admitting: Emergency Medicine

## 2015-08-07 ENCOUNTER — Emergency Department (HOSPITAL_BASED_OUTPATIENT_CLINIC_OR_DEPARTMENT_OTHER): Payer: Medicaid Other

## 2015-08-07 DIAGNOSIS — R51 Headache: Secondary | ICD-10-CM | POA: Insufficient documentation

## 2015-08-07 DIAGNOSIS — Z79899 Other long term (current) drug therapy: Secondary | ICD-10-CM | POA: Insufficient documentation

## 2015-08-07 DIAGNOSIS — G4700 Insomnia, unspecified: Secondary | ICD-10-CM | POA: Insufficient documentation

## 2015-08-07 DIAGNOSIS — C561 Malignant neoplasm of right ovary: Secondary | ICD-10-CM

## 2015-08-07 DIAGNOSIS — G8929 Other chronic pain: Secondary | ICD-10-CM | POA: Diagnosis not present

## 2015-08-07 DIAGNOSIS — Z8543 Personal history of malignant neoplasm of ovary: Secondary | ICD-10-CM | POA: Diagnosis not present

## 2015-08-07 DIAGNOSIS — Z7952 Long term (current) use of systemic steroids: Secondary | ICD-10-CM | POA: Insufficient documentation

## 2015-08-07 DIAGNOSIS — E876 Hypokalemia: Secondary | ICD-10-CM | POA: Insufficient documentation

## 2015-08-07 DIAGNOSIS — Z880 Allergy status to penicillin: Secondary | ICD-10-CM | POA: Insufficient documentation

## 2015-08-07 DIAGNOSIS — Z79891 Long term (current) use of opiate analgesic: Secondary | ICD-10-CM | POA: Diagnosis not present

## 2015-08-07 DIAGNOSIS — E669 Obesity, unspecified: Secondary | ICD-10-CM | POA: Insufficient documentation

## 2015-08-07 DIAGNOSIS — R600 Localized edema: Secondary | ICD-10-CM

## 2015-08-07 DIAGNOSIS — Z87891 Personal history of nicotine dependence: Secondary | ICD-10-CM | POA: Diagnosis not present

## 2015-08-07 DIAGNOSIS — C562 Malignant neoplasm of left ovary: Principal | ICD-10-CM

## 2015-08-07 DIAGNOSIS — Z7951 Long term (current) use of inhaled steroids: Secondary | ICD-10-CM | POA: Diagnosis not present

## 2015-08-07 DIAGNOSIS — M791 Myalgia: Secondary | ICD-10-CM | POA: Insufficient documentation

## 2015-08-07 DIAGNOSIS — M7989 Other specified soft tissue disorders: Secondary | ICD-10-CM | POA: Insufficient documentation

## 2015-08-07 DIAGNOSIS — J449 Chronic obstructive pulmonary disease, unspecified: Secondary | ICD-10-CM | POA: Diagnosis not present

## 2015-08-07 DIAGNOSIS — R1013 Epigastric pain: Secondary | ICD-10-CM | POA: Diagnosis present

## 2015-08-07 DIAGNOSIS — C563 Malignant neoplasm of bilateral ovaries: Secondary | ICD-10-CM

## 2015-08-07 LAB — COMPREHENSIVE METABOLIC PANEL
ALT: 17 U/L (ref 14–54)
AST: 14 U/L — AB (ref 15–41)
Albumin: 2.8 g/dL — ABNORMAL LOW (ref 3.5–5.0)
Alkaline Phosphatase: 53 U/L (ref 38–126)
Anion gap: 7 (ref 5–15)
BUN: 10 mg/dL (ref 6–20)
CHLORIDE: 100 mmol/L — AB (ref 101–111)
CO2: 33 mmol/L — AB (ref 22–32)
Calcium: 7.9 mg/dL — ABNORMAL LOW (ref 8.9–10.3)
Creatinine, Ser: 0.66 mg/dL (ref 0.44–1.00)
Glucose, Bld: 94 mg/dL (ref 65–99)
POTASSIUM: 2.8 mmol/L — AB (ref 3.5–5.1)
Sodium: 140 mmol/L (ref 135–145)
Total Bilirubin: 0.4 mg/dL (ref 0.3–1.2)
Total Protein: 5.8 g/dL — ABNORMAL LOW (ref 6.5–8.1)

## 2015-08-07 LAB — URINALYSIS, ROUTINE W REFLEX MICROSCOPIC
Bilirubin Urine: NEGATIVE
GLUCOSE, UA: NEGATIVE mg/dL
HGB URINE DIPSTICK: NEGATIVE
Ketones, ur: NEGATIVE mg/dL
Leukocytes, UA: NEGATIVE
Nitrite: NEGATIVE
PH: 8 (ref 5.0–8.0)
Protein, ur: NEGATIVE mg/dL
SPECIFIC GRAVITY, URINE: 1.007 (ref 1.005–1.030)

## 2015-08-07 LAB — CBC WITH DIFFERENTIAL/PLATELET
Basophils Absolute: 0 10*3/uL (ref 0.0–0.1)
Basophils Relative: 0 %
Eosinophils Absolute: 0 10*3/uL (ref 0.0–0.7)
Eosinophils Relative: 0 %
HEMATOCRIT: 31.6 % — AB (ref 36.0–46.0)
HEMOGLOBIN: 9.4 g/dL — AB (ref 12.0–15.0)
LYMPHS ABS: 0.5 10*3/uL — AB (ref 0.7–4.0)
LYMPHS PCT: 7 %
MCH: 26.8 pg (ref 26.0–34.0)
MCHC: 29.7 g/dL — AB (ref 30.0–36.0)
MCV: 90 fL (ref 78.0–100.0)
MONOS PCT: 4 %
Monocytes Absolute: 0.3 10*3/uL (ref 0.1–1.0)
NEUTROS PCT: 88 %
Neutro Abs: 6.5 10*3/uL (ref 1.7–7.7)
Platelets: 198 10*3/uL (ref 150–400)
RBC: 3.51 MIL/uL — AB (ref 3.87–5.11)
RDW: 17 % — ABNORMAL HIGH (ref 11.5–15.5)
WBC: 7.4 10*3/uL (ref 4.0–10.5)

## 2015-08-07 MED ORDER — HEPARIN SOD (PORK) LOCK FLUSH 100 UNIT/ML IV SOLN
500.0000 [IU] | Freq: Once | INTRAVENOUS | Status: AC
  Administered 2015-08-07: 500 [IU]
  Filled 2015-08-07: qty 5

## 2015-08-07 MED ORDER — POTASSIUM CHLORIDE CRYS ER 20 MEQ PO TBCR: 20.0000 meq | EXTENDED_RELEASE_TABLET | Freq: Every day | ORAL | Status: AC

## 2015-08-07 MED ORDER — HYDROMORPHONE HCL 1 MG/ML IJ SOLN
1.0000 mg | Freq: Once | INTRAMUSCULAR | Status: AC
Start: 2015-08-07 — End: 2015-08-07
  Administered 2015-08-07: 1 mg via INTRAVENOUS
  Filled 2015-08-07: qty 1

## 2015-08-07 MED ORDER — HYDROMORPHONE HCL 1 MG/ML IJ SOLN
1.0000 mg | Freq: Once | INTRAMUSCULAR | Status: AC
  Administered 2015-08-07: 1 mg via INTRAVENOUS
  Filled 2015-08-07: qty 1

## 2015-08-07 MED ORDER — POTASSIUM CHLORIDE CRYS ER 20 MEQ PO TBCR
40.0000 meq | EXTENDED_RELEASE_TABLET | Freq: Once | ORAL | Status: AC
  Administered 2015-08-07: 40 meq via ORAL
  Filled 2015-08-07: qty 2

## 2015-08-07 NOTE — ED Notes (Signed)
Pt on cont pox monitoring, sr x 2 up, family at side

## 2015-08-07 NOTE — ED Notes (Signed)
Patient transported to X-ray via stretcher, sr x 2 up

## 2015-08-07 NOTE — ED Provider Notes (Signed)
CSN: [id number]     Arrival date & time 08/07/15  1657 History  By signing my name below, I, Rayna Sexton, attest that this documentation has been prepared under the direction and in the presence of Malvin Johns, MD. Electronically Signed: Rayna Sexton, ED Scribe. 08/07/2015. 5:47 PM.   Chief Complaint  Patient presents with  . Abdominal Pain   The history is provided by the patient and a relative. No language interpreter was used.    HPI Comments: Debra [name] is a 44 y.o. 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 She notes her leg swelling is a chronic symptom further noting she takes lasix which was increased from 40 mg to 80 mg daily beginning 1 week ago. Pt notes worsening, moderate, pain to the affected regions she describes as a pressure.  The swelling has worsened over the last 6 days.  Pt also complains of a moderate anterior HA which began about 3 weeks ago once she began radiation.   She's had ongoing headaches since being a dose of the brain metastases. The had increased after the radiation therapy. She's currently on a Decadron taper. She has associated seizures and ataxia which is unchanged from baseline.  She also complains of constant, mild, epigastric pain with onset 2 days ago. Pt describes it as "hard and abnormal". Pt notes an associated, mild, unproductive cough. Pt notes that she's had 16x radiology treatments to her brain and 4x chemo treatments for her ovarian cancer. She notes a SHx last year to remove 9 lymph nodes further noting that she was dx with ovarian CA 1st which was about 1 year ago and her brain CA was dx 4 weeks ago. Pt notes that she is getting treatment at [facility] further noting she has a doppler scheduled with her oncologist tomorrow. Her relative notes she was dx with lymphedema. Pt notes typically taking 30 mg  morphine for pain. She denies fevers, chills, n/v or other associated symptoms at this time.   Past Medical History  Diagnosis Date  . Asthma   . Ulcerative colitis   . Anxiety   . Depression   . Chronic back pain   . Chronic knee pain   . Thyroid disease     hypo  . GERD (gastroesophageal reflux disease)   . Insomnia   . Allergic rhinitis   . Arthritis   . COPD (chronic obstructive pulmonary disease) ([place])   . Mouth pain     jaw and mouth pain 06-13-15  . Cancer [facility])     Ovarian cancer 10'15- Dr. Curlene Dolphin follows. Chemo last 1'16, additional planned .  Marland Kitchen Chronic respiratory failure ([place]): 3L [place] 06/23/2015   Past Surgical History  Procedure Laterality Date  . Knee surgery      x3, two on left and one on right.  Arthoscopy  . Cholecystectomy    . Abdominal hysterectomy    . Cesarean section    . Tonsillectomy    . Joint replacement      2x on left, 1x on right  . Portacath placement Right     06-13-15 remains inplace  . Multiple extractions with alveoloplasty N/A 06/14/2015    Procedure: Extraction of tooth #'s 2, 4-14, 20-29 with alveoloplasty;  Surgeon: Lenn Cal, DDS;  Location: [facility] ORS;  Service: Oral Surgery;  Laterality: N/A;   Family History  Problem  Relation Age of Onset  . High blood pressure Mother   . Bipolar disorder Mother   . Hypothyroidism Mother   . Rheum arthritis Mother   . Breast cancer Mother   . Ovarian cancer Mother   . Asthma Father   . Parkinson's disease Father   . CAD Father    Social History  Substance Use Topics  . Smoking status: Former Smoker -- 1.00 packs/day for 3 years    Types: Cigarettes    Quit date: 10/19/2011  . Smokeless tobacco: Never Used  . Alcohol Use: No     Comment: rarely    OB History    No data available     Review of Systems  Constitutional: Negative for fever, chills, diaphoresis and fatigue.  HENT: Negative for congestion, rhinorrhea and sneezing.   Eyes: Negative.   Respiratory: Negative for  cough, chest tightness and shortness of breath.   Cardiovascular: Positive for leg swelling. Negative for chest pain.  Gastrointestinal: Positive for abdominal pain. Negative for nausea, vomiting, diarrhea and blood in stool.  Genitourinary: Negative for frequency, hematuria, flank pain and difficulty urinating.  Musculoskeletal: Positive for myalgias. Negative for back pain and arthralgias.  Skin: Positive for wound. Negative for rash.  Neurological: Positive for headaches. Negative for dizziness, speech difficulty, weakness and numbness.   Allergies  Zofran; Penicillins; and Sulfamethoxazole-trimethoprim  Home Medications   Prior to Admission medications   Medication Sig Start Date End Date Taking? Authorizing Provider  albuterol (PROAIR HFA) 108 (90 BASE) MCG/ACT inhaler Inhale 2 puffs into the lungs every 4 (four) hours as needed for wheezing or shortness of breath. 06/30/15   Nita Sells, MD  ALPRAZolam Duanne Moron) 0.5 MG tablet Take 1 tablet (0.5 mg total) by mouth 3 (three) times daily as needed for anxiety. 07/31/15   Tyler Pita, MD  benzonatate (TESSALON) 100 MG capsule Take 100 mg by mouth 3 (three) times daily as needed for cough.  01/03/15   Historical Provider, MD  buPROPion (WELLBUTRIN SR) 150 MG 12 hr tablet Take 300 mg by mouth daily.  06/21/15   Historical Provider, MD  citalopram (CELEXA) 40 MG tablet Take 40 mg by mouth every morning.     Historical Provider, MD  dexamethasone (DECADRON) 4 MG tablet Take 1 tablet (4 mg total) by mouth 2 (two) times daily. For one week, then, 2 mg twice daily for one week, then, 2 mg daily. 07/21/15   Tyler Pita, MD  Dextromethorphan-Guaifenesin [facility] DM) 30-600 MG TB12 Take 2 tablets by mouth every 6 (six) hours as needed (for cough/congestion).    Historical Provider, MD  fluconazole (DIFLUCAN) 10 MG/ML suspension TAKE 10ML (100MG ) DAILY FOR TEN DAYS 07/05/15   Historical Provider, MD  Fluticasone-Salmeterol (ADVAIR) 500-50  MCG/DOSE AEPB Inhale 2 puffs into the lungs 2 (two) times daily.     Historical Provider, MD  furosemide (LASIX) 40 MG tablet Take 40 mg by mouth daily.    Historical Provider, MD  ipratropium-albuterol (DUONEB) 0.5-2.5 (3) MG/3ML SOLN Take 3 mLs by nebulization every 6 (six) hours as needed (for wheezing. Take three times daily for  next 3 days). 02/28/14   Shanker Kristeen Mans, MD  levETIRAcetam (KEPPRA) 500 MG tablet Take 1 tablet (500 mg total) by mouth 2 (two) times daily. 07/20/15   Tyler Pita, MD  levothyroxine (SYNTHROID, LEVOTHROID) 175 MCG tablet Take 175 mcg by mouth daily before breakfast.    Historical Provider, MD  lidocaine-prilocaine (EMLA) cream Apply 1 application topically daily as needed.  07/07/14   Historical Provider, MD  LORazepam (ATIVAN) 0.5 MG tablet Take 1 tablet (0.5 mg total) by mouth 3 (three) times daily. 07/21/15   Tyler Pita, MD  montelukast (SINGULAIR) 10 MG tablet Take 10 mg by mouth at bedtime.     Historical Provider, MD  morphine (MS CONTIN) 30 MG 12 hr tablet Take 30 mg by mouth 2 (two) times daily. 06/17/15   Historical Provider, MD  morphine (MSIR) 15 MG tablet Take 15 mg by mouth 2 (two) times daily. 06/17/15   Historical Provider, MD  omeprazole (PRILOSEC) 40 MG capsule Take 40 mg by mouth 2 (two) times daily.     Historical Provider, MD  OXYGEN Inhale 3 L/min into the lungs every evening.     Historical Provider, MD  potassium chloride SA (K-DUR,KLOR-CON) 20 MEQ tablet Take 1 tablet (20 mEq total) by mouth daily. 08/07/15   Malvin Johns, MD  prochlorperazine (COMPAZINE) 10 MG tablet Take 1 tablet (10 mg total) by mouth every 6 (six) hours as needed for nausea or vomiting. 07/27/15   Lennis Marion Downer, MD  promethazine (PHENERGAN) 25 MG tablet Take 1 tablet (25 mg total) by mouth every 6 (six) hours as needed for nausea or vomiting. 05/27/15   Lennis Marion Downer, MD  sorbitol 70 % SOLN Take 30 mLs by mouth daily as needed for moderate constipation. 06/30/15    Nita Sells, MD  Tiotropium Bromide Monohydrate (SPIRIVA RESPIMAT) 2.5 MCG/ACT AERS Inhale 2 puffs into the lungs 2 (two) times daily.    Historical Provider, MD  traZODone (DESYREL) 100 MG tablet Take 100 mg by mouth at bedtime as needed for sleep.     Historical Provider, MD  Wound Dressings (SONAFINE) Apply 1 application topically 3 (three) times daily.    Historical Provider, MD   BP 116/63 mmHg  Pulse 86  Temp(Src) 99 F (37.2 C) (Oral)  Resp 14  Ht 5\' 2"  (1.575 m)  Wt 300 lb (136.079 kg)  BMI 54.86 kg/m2  SpO2 92% Physical Exam  Constitutional: She is oriented to person, place, and time. She appears well-developed and well-nourished.  Obese  HENT:  Head: Normocephalic and atraumatic.  Eyes: Pupils are equal, round, and reactive to light.  Neck: Normal range of motion. Neck supple.  Cardiovascular: Normal rate, regular rhythm and normal heart sounds.   Pulmonary/Chest: Effort normal and breath sounds normal. No respiratory distress. She has no wheezes. She has no rales. She exhibits no tenderness.  Abdominal: Soft. Bowel sounds are normal. There is no tenderness. There is no rebound and no guarding.  Musculoskeletal: Normal range of motion. She exhibits edema.  3+ pain edema to extremities bilaterally with the left being greater than the right. She has some generalized tenderness on palpation of both lower extremities. There some overlying abrasions to her knees. There is no erythema warmth or signs of infection. Pedal pulses are intact.  Lymphadenopathy:    She has no cervical adenopathy.  Neurological: She is alert and oriented to person, place, and time. She has normal strength. No cranial nerve deficit or sensory deficit. GCS eye subscore is 4. GCS verbal subscore is 5. GCS motor subscore is 6.  Skin: Skin is warm and dry. No rash noted.  Psychiatric: She has a normal mood and affect.   ED Course  Procedures  DIAGNOSTIC STUDIES: Oxygen Saturation is 97% on RA,  normal by my interpretation.    COORDINATION OF CARE: 5:41 PM Pt presents today with multiple complaints. Discussed next  steps with pt and she agreed to the plan.   Labs Review Labs Reviewed  COMPREHENSIVE METABOLIC PANEL - Abnormal; Notable for the following:    Potassium 2.8 (*)    Chloride 100 (*)    CO2 33 (*)    Calcium 7.9 (*)    Total Protein 5.8 (*)    Albumin 2.8 (*)    AST 14 (*)    All other components within normal limits  CBC WITH DIFFERENTIAL/PLATELET - Abnormal; Notable for the following:    RBC 3.51 (*)    Hemoglobin 9.4 (*)    HCT 31.6 (*)    MCHC 29.7 (*)    RDW 17.0 (*)    Lymphs Abs 0.5 (*)    All other components within normal limits  URINALYSIS, ROUTINE W REFLEX MICROSCOPIC (NOT AT [facility])    Imaging Review US Venous Img Lower Bilateral  08/07/2015  CLINICAL DATA:  Bilateral leg edema for 2 months. History of brain carcinoma and ovarian cancer. EXAM: BILATERAL LOWER EXTREMITY VENOUS DOPPLER ULTRASOUND TECHNIQUE: Gray-scale sonography with graded compression, as well as color Doppler and duplex ultrasound were performed to evaluate the lower extremity deep venous systems from the level of the common femoral vein and including the common femoral, femoral, profunda femoral, popliteal and calf veins including the posterior tibial, peroneal and gastrocnemius veins when visible. The superficial great saphenous vein was also interrogated. Spectral Doppler was utilized to evaluate flow at rest and with distal augmentation maneuvers in the common femoral, femoral and popliteal veins. COMPARISON:  None. FINDINGS: RIGHT LOWER EXTREMITY Common Femoral Vein: No evidence of thrombus. Normal compressibility, respiratory phasicity and response to augmentation. Saphenofemoral Junction: No evidence of thrombus. Normal compressibility and flow on color Doppler imaging. Profunda Femoral Vein: No evidence of thrombus. Normal compressibility and flow on color Doppler imaging. Femoral  Vein: No evidence of thrombus. Normal compressibility, respiratory phasicity and response to augmentation. Popliteal Vein: No evidence of thrombus. Normal compressibility, respiratory phasicity and response to augmentation. Calf Veins: No evidence of thrombus. Normal compressibility and flow on color Doppler imaging. Superficial Great Saphenous Vein: No evidence of thrombus. Normal compressibility and flow on color Doppler imaging. Venous Reflux:  None. Other Findings:  Nonspecific lower extremity subcutaneous edema. LEFT LOWER EXTREMITY Common Femoral Vein: No evidence of thrombus. Normal compressibility, respiratory phasicity and response to augmentation. Saphenofemoral Junction: No evidence of thrombus. Normal compressibility and flow on color Doppler imaging. Profunda Femoral Vein: No evidence of thrombus. Normal compressibility and flow on color Doppler imaging. Femoral Vein: No evidence of thrombus. Normal compressibility, respiratory phasicity and response to augmentation. Popliteal Vein: No evidence of thrombus. Normal compressibility, respiratory phasicity and response to augmentation. Calf Veins: Not well visualized. Superficial Great Saphenous Vein: No evidence of thrombus. Normal compressibility and flow on color Doppler imaging. Venous Reflux:  None. Other Findings:  Nonspecific lower extremity subcutaneous edema. IMPRESSION: No evidence of deep venous thrombosis. Electronically Signed   By: Lajean Manes M.D.   On: 08/07/2015 19:21   Dg Abd Acute W/chest  08/07/2015  CLINICAL DATA:  Constant mid epigastric pain.  Onset 2 days ago. EXAM: DG ABDOMEN ACUTE W/ 1V CHEST COMPARISON:  06/23/2015 FINDINGS: Right chest wall port a catheter is noted with tip in the projection of the SVC. Normal heart size. No pleural effusion or edema. Previous cholecystectomy. No abnormal dilatation of the large or small bowel loops. No fluid levels identified. IMPRESSION: 1. Nonobstructive bowel gas pattern. 2. No acute  cardiopulmonary abnormalities. Electronically Signed  By: Kerby Moors M.D.   On: 08/07/2015 19:03   I have personally reviewed and evaluated these images and lab results as part of my medical decision-making.   EKG Interpretation None      MDM   Final diagnoses:  Pedal edema  Hypokalemia    Patient presents with a primary complaint of increasing lower leg edema. She is scheduled to have a Doppler ultrasound tomorrow but we went ahead and did in the ED tonight. This was negative for DVT. This is likely lymphedema resulting from her prior pelvic surgery in combination with her hypoalbuminemia. She also has a frontal headache. This seems to be unchanged from her ongoing headaches. She doesn't have any new neurologic abnormalities. She's afebrile and doesn't appear to be systemically ill. She was discharged from good condition. She has an appointment to follow-up with her oncologist tomorrow. She did have hypokalemia. This was replaced in the ED and I give her prescription to take for the next 3 days. I advised her that this will need to be rechecked by her oncologist.  I personally performed the services described in this documentation, which was scribed in my presence.  The recorded information has been reviewed and considered.    Malvin Johns, MD 08/07/15 2116

## 2015-08-07 NOTE — ED Notes (Signed)
Patient states that she has stage 4 brain cancer. The patient reports that she has had pain to her epigastric area since Friday. Radiation treatment 2 weeks ago

## 2015-08-07 NOTE — ED Notes (Signed)
Port accessed at rt ant chest, sterile technique used. Tegaderm dsg applied with antimicrobial disk also applied, blood drawn for labs per EDP orders and to lab, pt medicated per EDP orders also

## 2015-08-07 NOTE — Discharge Instructions (Signed)

## 2015-08-08 ENCOUNTER — Ambulatory Visit (HOSPITAL_BASED_OUTPATIENT_CLINIC_OR_DEPARTMENT_OTHER): Payer: Medicaid Other | Admitting: Oncology

## 2015-08-08 ENCOUNTER — Other Ambulatory Visit: Payer: Self-pay

## 2015-08-08 ENCOUNTER — Other Ambulatory Visit (HOSPITAL_BASED_OUTPATIENT_CLINIC_OR_DEPARTMENT_OTHER): Payer: Medicaid Other

## 2015-08-08 ENCOUNTER — Telehealth: Payer: Self-pay

## 2015-08-08 ENCOUNTER — Encounter: Payer: Self-pay | Admitting: Genetic Counselor

## 2015-08-08 ENCOUNTER — Encounter (HOSPITAL_COMMUNITY): Payer: Self-pay

## 2015-08-08 ENCOUNTER — Encounter: Payer: Self-pay | Admitting: Oncology

## 2015-08-08 VITALS — BP 123/96 | HR 80 | Temp 98.8°F | Resp 18 | Ht 62.0 in | Wt 237.9 lb

## 2015-08-08 DIAGNOSIS — C563 Malignant neoplasm of bilateral ovaries: Secondary | ICD-10-CM

## 2015-08-08 DIAGNOSIS — E039 Hypothyroidism, unspecified: Secondary | ICD-10-CM

## 2015-08-08 DIAGNOSIS — C561 Malignant neoplasm of right ovary: Secondary | ICD-10-CM

## 2015-08-08 DIAGNOSIS — G8921 Chronic pain due to trauma: Secondary | ICD-10-CM

## 2015-08-08 DIAGNOSIS — Z95828 Presence of other vascular implants and grafts: Secondary | ICD-10-CM

## 2015-08-08 DIAGNOSIS — C562 Malignant neoplasm of left ovary: Secondary | ICD-10-CM | POA: Diagnosis not present

## 2015-08-08 DIAGNOSIS — C7931 Secondary malignant neoplasm of brain: Secondary | ICD-10-CM | POA: Diagnosis not present

## 2015-08-08 DIAGNOSIS — B370 Candidal stomatitis: Secondary | ICD-10-CM

## 2015-08-08 DIAGNOSIS — F4323 Adjustment disorder with mixed anxiety and depressed mood: Secondary | ICD-10-CM

## 2015-08-08 DIAGNOSIS — G893 Neoplasm related pain (acute) (chronic): Secondary | ICD-10-CM

## 2015-08-08 LAB — BASIC METABOLIC PANEL
Anion Gap: 13 mEq/L — ABNORMAL HIGH (ref 3–11)
BUN: 8.5 mg/dL (ref 7.0–26.0)
CHLORIDE: 101 meq/L (ref 98–109)
CO2: 27 meq/L (ref 22–29)
Calcium: 8.7 mg/dL (ref 8.4–10.4)
Creatinine: 0.8 mg/dL (ref 0.6–1.1)
EGFR: 86 mL/min/{1.73_m2} — AB (ref >90)
Glucose: 136 mg/dl (ref 70–140)
POTASSIUM: 3.4 meq/L — AB (ref 3.5–5.1)
SODIUM: 141 meq/L (ref 136–145)

## 2015-08-08 MED ORDER — BENZONATATE 100 MG PO CAPS: 100.0000 mg | ORAL_CAPSULE | Freq: Three times a day (TID) | ORAL | Status: DC | PRN

## 2015-08-08 MED ORDER — MORPHINE SULFATE ER 15 MG PO TBCR: 15.0000 mg | EXTENDED_RELEASE_TABLET | Freq: Two times a day (BID) | ORAL | Status: DC

## 2015-08-08 MED ORDER — MORPHINE SULFATE 30 MG PO TABS: 30.0000 mg | ORAL_TABLET | ORAL | Status: DC | PRN

## 2015-08-08 MED ORDER — NYSTATIN 100000 UNIT/GM EX POWD: 1.0000 g | Freq: Two times a day (BID) | CUTANEOUS | Status: DC

## 2015-08-08 MED ORDER — FLUCONAZOLE 100 MG PO TABS: 100.0000 mg | ORAL_TABLET | Freq: Every day | ORAL | Status: DC

## 2015-08-08 MED ORDER — ALPRAZOLAM 0.5 MG PO TABS: 0.5000 mg | ORAL_TABLET | Freq: Three times a day (TID) | ORAL | Status: DC | PRN

## 2015-08-08 MED ORDER — NYSTATIN 100000 UNIT/GM EX POWD: 1.0000 g | Freq: Two times a day (BID) | CUTANEOUS | Status: AC

## 2015-08-08 NOTE — Progress Notes (Signed)
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 She completed cranial radiation by Dr Tammi Klippel ~ 07-21-15. Palliative Care is involved, however this is not enough assistance now. She was seen in ED last pm for ongoing symptoms, LE venous dopplers negative.  Patient tells me "I just feel terrible". She has some HA, decadron tapered down to 2 mg daily, which is not to be reduced further. She has fallen once at home, without injury, has difficulty getting to [facility] etc. HH has not been able to get [facility] for her. She increased MSContin and MSIR due to HA, chronic scripts for preexisting pain problems not due to be refilled before 08-14-15 (with pharmacies also closed that day). Swelling in LE is increased, uncomfortable and less mobile from the weight, venous dopplers negative for DVT at ED visit on 08-07-15. Breathing is no worse, some cough productive of small amounts of thick tan sputum. She has oral thrush now.  She has tremors of extremities, suggesting the partial seizures noted during hospitalization in Nov. She is on keppra, missed dose this AM with ED visit, no tonic clonic seizures. No fever on the steroids. No vomiting. No bleeding. Bowels are moving.    PAC in, flushed 08-07-15 Genetics testing planned today, unable to be accomplished due to ED visit. Flu vaccine done 04-21-15  ONCOLOGIC HISTORY Patient presented 2015 with >= 1 year of abdominal pain, which was evaluated variously during that time at EDs [place], [place], including CTs. Patient was post vaginal  hysterectomy ~ 2005 for cervical dysplasia. In 05-2014 she was found to have gyn abnormalities on CT, referred to Dr [name]. She had pulmonary evaluation by Dr [name] prior to surgery, then exploratory laparotomy 06-03-14 at [facility] with BSO, tumor debulking and right pelvic node evaluation. Pathology [facility] [phone] from 06-03-14) bilateral ovaries and fallopian tubes with high grade serous carcinoma with clear cell changes, with tumor entirely replacing left ovary 12.1 cm maximum diameter and on right also extending into paraovarian soft tissue, extensive angiolymphatic space involvement, as well as involvement of sigmoid mesentery, posterior cul de sac, right pelvic sidewall, omentum, 6 of 6 nodes involved, and malignant ascites. She was hospitalized again Nov 8-14, 2015 at [facility], with pelvic abscess. She received first chemotherapy with weekly taxol carboplatin on 08-05-14 by Dr [name]. She was admitted 08-23-14 with respiratory symptoms and possible gastritis/ colitis, with ANC nadir 0.41 during that hospitalization. She received cycle 2 taxol carboplatin 09-02-14, then was again admitted to [facility] 09-04-14 with acute respiratory failure, requiring several days on ventilator in ICU. At visit with Dr [name] 09-22-14, pulmonary situation was not stable for resuming chemotherapy. She has been followed with observation by gyn oncology and medical oncology at [facility] since 09-2014. CT AP 05-19-15 showed progressive disease, with plan to attempt single agent carboplatin after full dental extractions done 06-14-15, however she presented with symptomatic multiple brain mets in early Nov 2016.    Objective:  Vital signs in last 24 hours:  BP 123/96 mmHg  Pulse 80  Temp(Src) 98.8 F (37.1 C) (Oral)  Resp 18  Ht _0  (  1.575 m)  Wt 237 lb 14.4 oz (107.911 kg)  BMI 43.50 kg/m2  SpO2 97% Weight up 8 lbs with LE swelling. In seated in [place] but walked short  distance. Respirations not labored, one episode coughing with small tan mucous. Looks uncomfortable, tearful at times, tremors of extremities intermittently, alert, oriented and appropriate, speech clear.  Alopecia  HEENT:Cushingoid. PERRL, sclerae not icteric. Oral mucosa moist with candida tongue, buccal mucosa, posterior pharynx.  No JVD.  Lymphatics:no supraclavicular adenopathy Resp: diminished BS bilaterally without wheeze or rales, no dullness to percussion bilaterally Cardio: regular rate and rhythm. No gallop. GI: soft, nontender. Normally active bowel sounds. Surgical incision not remarkable. Musculoskeletal/ Extremities: 3+ tight swelling LLE to thigh and 2+ RLE. No weepiing Neuro: speech fluent. Moves all extremities. Gait unsteady. Tremors intermittently extremities Skin without rash, ecchymosis, petechiae Portacath-without erythema or tenderness  Lab Results:  Results for orders placed or performed in visit on 71/69/67  Basic metabolic panel  Result Value Ref Range   Sodium 141 136 - 145 mEq/L   Potassium 3.4 (L) 3.5 - 5.1 mEq/L   Chloride 101 98 - 109 mEq/L   CO2 27 22 - 29 mEq/L   Glucose 136 70 - 140 mg/dl   BUN 8.5 7.0 - 26.0 mg/dL   Creatinine 0.8 0.6 - 1.1 mg/dL   Calcium 8.7 8.4 - 10.4 mg/dL   Anion Gap 13 (H) 3 - 11 mEq/L   EGFR 86 (L) >90 ml/min/1.73 m2     Studies/Results:  US Venous Img Lower Bilateral  08/07/2015  CLINICAL DATA:  Bilateral leg edema for 2 months. History of brain carcinoma and ovarian cancer. EXAM: BILATERAL LOWER EXTREMITY VENOUS DOPPLER ULTRASOUND TECHNIQUE: Gray-scale sonography with graded compression, as well as color Doppler and duplex ultrasound were performed to evaluate the lower extremity deep venous systems from the level of the common femoral vein and including the common femoral, femoral, profunda femoral, popliteal and calf veins including the posterior tibial, peroneal and gastrocnemius veins when visible. The superficial  great saphenous vein was also interrogated. Spectral Doppler was utilized to evaluate flow at rest and with distal augmentation maneuvers in the common femoral, femoral and popliteal veins. COMPARISON:  None. FINDINGS: RIGHT LOWER EXTREMITY Common Femoral Vein: No evidence of thrombus. Normal compressibility, respiratory phasicity and response to augmentation. Saphenofemoral Junction: No evidence of thrombus. Normal compressibility and flow on color Doppler imaging. Profunda Femoral Vein: No evidence of thrombus. Normal compressibility and flow on color Doppler imaging. Femoral Vein: No evidence of thrombus. Normal compressibility, respiratory phasicity and response to augmentation. Popliteal Vein: No evidence of thrombus. Normal compressibility, respiratory phasicity and response to augmentation. Calf Veins: No evidence of thrombus. Normal compressibility and flow on color Doppler imaging. Superficial Great Saphenous Vein: No evidence of thrombus. Normal compressibility and flow on color Doppler imaging. Venous Reflux:  None. Other Findings:  Nonspecific lower extremity subcutaneous edema. LEFT LOWER EXTREMITY Common Femoral Vein: No evidence of thrombus. Normal compressibility, respiratory phasicity and response to augmentation. Saphenofemoral Junction: No evidence of thrombus. Normal compressibility and flow on color Doppler imaging. Profunda Femoral Vein: No evidence of thrombus. Normal compressibility and flow on color Doppler imaging. Femoral Vein: No evidence of thrombus. Normal compressibility, respiratory phasicity and response to augmentation. Popliteal Vein: No evidence of thrombus. Normal compressibility, respiratory phasicity and response to augmentation. Calf Veins: Not well visualized. Superficial Great Saphenous Vein: No evidence of thrombus. Normal compressibility and flow on color Doppler imaging. Venous Reflux:  None. Other Findings:  Nonspecific  lower extremity subcutaneous edema. IMPRESSION: No  evidence of deep venous thrombosis. Electronically Signed   By: Lajean Manes M.D.   On: 08/07/2015 19:21   Dg Abd Acute W/chest  08/07/2015  CLINICAL DATA:  Constant mid epigastric pain.  Onset 2 days ago. EXAM: DG ABDOMEN ACUTE W/ 1V CHEST COMPARISON:  06/23/2015 FINDINGS: Right chest wall port a catheter is noted with tip in the projection of the SVC. Normal heart size. No pleural effusion or edema. Previous cholecystectomy. No abnormal dilatation of the large or small bowel loops. No fluid levels identified. IMPRESSION: 1. Nonobstructive bowel gas pattern. 2. No acute cardiopulmonary abnormalities. Electronically Signed   By: Kerby Moors M.D.   On: 08/07/2015 19:03    Medications: I have reviewed the patient's current medications. Pain medication changed and rewritten to allow scripts to be filled now, as follows: MSContin increased from present 30 mg bid to 45 mg bid, with script for #45 of 15 mg tablets. If out of 30 mg tablets prior to refill allowed, will need to use the 15 mg tablets instead for correct dose.  MSIR 30 mg #30 tablets to use q 4 hrs prn breakthru pain. She and mother understand that these can be cut in half if needed, as previous dose was 15 mg MSIR   Also diflucan 100 mg daily x 7. Note may need nystatin mouthwash or further diflucan as she will stay on decadron.  Has potassium script from ED  DISCUSSION: Patient, mother and I have discussed difficult situation with advanced metastatic cancer, still very symptomatic from brain mets despite radiation completed almost 3 weeks ago. She did not tolerate adjuvant chemotherapy, that complicated by hospitalizations with intubation. She does not want and could not tolerate additional chemotherapy now. She and mother are in full agreement with hospice referral. They are very appreciative of care and know that we will continue to work with Hospice team. Patient requests follow up visit be scheduled here, understands that this can  be cancelled or moved closer to date if needed. Hospice to be informed that she needs WC. Will try gently wrapping legs with ace as she cannot tolerate teds.  Patient likely cannot manage genetics appointment. They understand that her mother, who had ovarian and breast cancers, could have testing done, particularly to have this information for [place]'s daughter.  Medications discussed as above. I will let Dr Holley Raring know about changes in pain medication and those prescriptions, and Hospice also will assist.  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 Recent progressive disease in abdomen pelvis, then multiple brain mets apparent in early Nov. Whole brain radiation completed ~ 07-21-15, decadron tapered to 2 mg daily which will continue. Hospice referral now. 2.recurrent pneumonias: multiple hospitalizations in past 1-2 years, intubated x several days at [facility] in [facility]. Dr [name] follows closely for pulmonary, several courses of antibiotics and steroids in last 3-4 months.Asthma since childhood. Minimal past tobacco 3.LE swelling: no DVTs by dopplers 08-07-15. Likely related to progressive malignancy in pelvis.  4.PAC in. Slightly tilted, sometimes difficult to access. Flushed 08-07-15. 5.chronic pain since tractor accident in childhood: until now pain medication by PCP under pain management contract. I have adjusted doses and given prescriptions now as above.  6.family  history of ovarian and breast cancer in mother. They are interested in genetics counseling, but need to reschedule due to other situation today 7.post laparoscopic cholecystectomy, vaginal hysterectomy for cervical  indications, knee surgeries, C section 8.hypothyroid x ~ 20 years, on replacement by PCP 9. Post full dental extraction in Oct. 10. Reported ulcerative colitis 2000, tho I am not sure of that diagnosis 11.morbid obesity, BMI 42.9. Multifactorial, including necessary steroids. 12.long standing GERD. Colonoscopy done Dec 2015 13.past incarceration for drugs. Her probation was ended with diagnosis of brain mets. 14.flu vaccine done 04-21-15 15. Patient does not want to be maintained on life support in irreversible situation.  [employer]si to zofran causing HA   All questions answered. Time spent 35 min including >50% counseling and coordination of care. Cc Drs Holley Raring, Ernestene Kiel. Will ask HIM to send to Dr Pura Spice, MD   08/08/2015, 6:19 PM

## 2015-08-08 NOTE — Telephone Encounter (Signed)
Returning mother's call. Pt went to ER last night for headache and lymphedema. Verify appt times for today. S/w Dr Marko Plume and she does want to recheck BMET today to follow the pt's low K. Will r/s genetics visit d/t pt still at home and won't make appt. Pt will try to make 2 00 lab appt and 330 with dr LL. Pt started taking K this morning, received script from ER.

## 2015-08-08 NOTE — Telephone Encounter (Signed)
Prescriptions were sent to [facility] by mistake and called [facility] to fix mistake. [facility] called to cancel.

## 2015-08-09 ENCOUNTER — Telehealth: Payer: Self-pay

## 2015-08-09 ENCOUNTER — Telehealth: Payer: Self-pay | Admitting: Oncology

## 2015-08-09 ENCOUNTER — Encounter (HOSPITAL_COMMUNITY): Payer: Self-pay | Admitting: Dentistry

## 2015-08-09 ENCOUNTER — Telehealth: Payer: Self-pay | Admitting: Radiation Oncology

## 2015-08-09 NOTE — Telephone Encounter (Signed)
Received refill request for Ativan 0.5 mg tablet from [facility]. Phoned patient's home, spoke with her mother, and inquired if Ativan was still needed and which pharmacy to send it to. Debra [name] reports they are in the process of switching from [facility] to [facility] and the script should be called to [facility]. Phoned [facility] with Ativan 0.5 mg tablet, one tablet by mouth tid, qty 90, and no refills. Explained to Debra [name] the script would be called to [facility]. Debra [name] verbalized understanding and expressed appreciation for the call.

## 2015-08-09 NOTE — Telephone Encounter (Signed)
Gave referral for Debra [name].  Told her that a wheel chair is also needed by the patient. Debra [name] stated that the patient will be seen tomorrow.  Ms. Debra [name] mother called earlier today.

## 2015-08-09 NOTE — Telephone Encounter (Signed)
Left message for patient re appointments for 1/23 @ 2 pm - genetics/lab/flush/LL. Appointments scheduled per 12/19 pof's. Schedule mailed.

## 2015-08-10 ENCOUNTER — Telehealth: Payer: Self-pay | Admitting: Radiation Oncology

## 2015-08-10 NOTE — Telephone Encounter (Signed)
Patient phoned requesting refill of her Lasix. Lasix prescribed by Dr. Holley Raring. Since patient is pleasantly confused this RN took the [place] of calling Dr. [place] nurse. Left  her a message explaining a new script was needed for the Lasix 80 mg because insurance will not cover a refill of the Lasix 40 mg because it's too early. Patient aware this RN is calling Dr. Lindwood Qua office.

## 2015-08-10 NOTE — Progress Notes (Signed)
  Radiation Oncology         [phone] ________________________________  Name: Debra [name] MRN: [id number]  Date: 07/21/2015  DOB: Jul 21, 1971  End of Treatment Note  Diagnosis:   44 y.o. woman with 8 brain metastases from ovarian  high grade cerous carcinoma with clear cell changes with the largest two metastases measuring 4.8 cm and 4.2 cm     Indication for treatment:  Palliation       Radiation treatment dates:   06/27/15-07/21/15  Site/dose:   The whole brain was treated to 35 Gy in 14 fractions of 2.5 Gy  Beams/energy:   Right and Left radiation fields were treated using 6 MV X-rays with custom MLC collimation to shield the eyes and face.  The patient was immobilized with a thermoplastic mask and isocenter was verified with weekly port films.  Narrative: The patient tolerated radiation treatment relatively well.   Reports intermittent headaches. She reported that at times her headaches were severe and could wake her from sleep. Unsteady gait reported. Reported her knees buckled and she fell during treatment. Abrasions were noted on both her knees. Hand tremors continued. Reported less diarrhea related to colitis. Reported taking decadron 4 mg bid. She did not have a seizure since she was in the hospital  Plan: The patient has completed radiation treatment. The patient will return to radiation oncology clinic for routine followup in one month. I advised them to call or return sooner if they have any questions or concerns related to their recovery or treatment. ________________________________  Sheral Apley. Tammi Klippel, M.D.

## 2015-08-12 ENCOUNTER — Telehealth: Payer: Self-pay | Admitting: Oncology

## 2015-08-12 NOTE — Telephone Encounter (Signed)
Medical Oncology  Spoke with [facility] RN, who had been called by Hospice RN, then with Hospice RN [place] [phone],  then with patient's mother on her cell.  Per Hospice RN, patient has been using only one of the 15 mg MSContin bid since that prescription written for #45 tablets on 08-08-15. Patient has taken the MSIR 30 mg tablets as breakthru every 4 hours and has used all of the #30 tablets written on 08-08-15.  Patient has been managing own medicines, but is not able to do this.  I reached mother on her cell, church members at their home so she was unable to count MSContin tablets, but believes that there are enough of these 15 mg tablets to take 3 tablets= 45 mg bid starting late this afternoon thru weekend. With this increase in MSContin, patient should not need another prescription for MSIR now. Mother wrote down instructions. She will now manage the medications. Mother appreciated call. I could hear [place] in background speaking with their visitors.  I spoke back with Hospice RN to let her know above. She will follow up early next week.  Godfrey Pick, MD

## 2015-08-17 ENCOUNTER — Other Ambulatory Visit: Payer: Self-pay | Admitting: Oncology

## 2015-08-18 ENCOUNTER — Telehealth: Payer: Self-pay | Admitting: Radiation Oncology

## 2015-08-18 ENCOUNTER — Encounter: Payer: Self-pay | Admitting: Radiation Oncology

## 2015-08-18 ENCOUNTER — Ambulatory Visit
Admission: RE | Admit: 2015-08-18 | Discharge: 2015-08-18 | Disposition: A | Discharge status: Home / Self Care | Payer: Medicaid Other | Source: Ambulatory Visit | Attending: Radiation Oncology | Admitting: Radiation Oncology

## 2015-08-18 VITALS — BP 112/76 | HR 103 | Resp 18 | Wt 239.6 lb

## 2015-08-18 DIAGNOSIS — C7931 Secondary malignant neoplasm of brain: Secondary | ICD-10-CM

## 2015-08-18 DIAGNOSIS — B370 Candidal stomatitis: Secondary | ICD-10-CM

## 2015-08-18 MED ORDER — FLUCONAZOLE 100 MG PO TABS: 100.0000 mg | ORAL_TABLET | Freq: Every day | ORAL | Status: AC

## 2015-08-18 NOTE — Progress Notes (Signed)
Weight and vitals stable. Continues Lasix 40 mg daily, decadron 2 mg once daily, and Keppra 500 mg bid. Reports most mornings she wakes up with a mild headache that resolves after taking her medication. Reports rare nausea or which she take phenergan. Reports HOSPICE care is now in the home. Lymphedema of left leg noted. Reports tremors continue. Patient has a history of MRSA. New onset of bright red macules noted left upper thigh. Denies ringing in her ears or diplopia. Reports she has an amazing appetite. Mother reports she ate three pecan pies in one day. [name] has resolved.   BP 112/76 mmHg  Pulse 103  Resp 18  Wt 239 lb 9.6 oz (108.682 kg)  SpO2 100% Wt Readings from Last 3 Encounters:  08/18/15 239 lb 9.6 oz (108.682 kg)  08/08/15 237 lb 14.4 oz (107.911 kg)  08/07/15 300 lb (136.079 kg)

## 2015-08-18 NOTE — Telephone Encounter (Signed)
Per Dr. Johny Shears order called in Diflucan 100 mg, one tablet by mouth daily, qty 7, and 3 refills. Phoned patient's mother making her aware this was done.

## 2015-08-18 NOTE — Progress Notes (Signed)
Radiation Oncology         [phone] ________________________________  Name: Debra [name] MRN: [id number]  Date: 08/18/2015  DOB: April 24, 1971    Follow-Up Visit Note  CC: Debra Spanish, MD  Gordy Levan, MD  Diagnosis:   44 y.o. 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 Oral candidiasis 112.0 B37.0 fluconazole (DIFLUCAN) 100 MG tablet  2. Brain metastasis (HCC) 198.3 C79.31     Interval Since Last Radiation:  1  months Radiation treatment dates:   06/27/15-07/21/15  Narrative:  The patient returns today for routine follow-up.  Weight and vitals stable. Continues Lasix 40 mg daily, decadron 2 mg once daily, and Keppra 500 mg bid. Reports most mornings she wakes up with a mild headache that resolves after taking her medication. Reports rare nausea for which she takes phenergan. Reports HOSPICE care is now in the home. Lymphedema of left leg noted. Reports tremors continue. Patient has a history of MRSA. New onset of bright red macules noted left upper thigh over the past four days. Referring to the skin irritation on her left leg, she reports that it burns and stings. She has been using bactrim on her knees after falling. Notes that she is allergic to sulfur medications and the skin irritation on her leg began after using the bactrim. Denies ringing in her ears or diplopia. Reports she has an amazing appetite. Mother reports she ate three pecan pies in one day. [name] has resolved. Reports that her current medication regimen has helped her a lot in the management of her pain.                                ALLERGIES:  is allergic to zofran; penicillins; and sulfamethoxazole-trimethoprim.  Meds: Current Outpatient Prescriptions  Medication Sig Dispense Refill  . albuterol (PROAIR HFA) 108 (90 BASE) MCG/ACT inhaler Inhale 2 puffs into the  lungs every 4 (four) hours as needed for wheezing or shortness of breath. 18 g 0  . ALPRAZolam (XANAX) 0.5 MG tablet Take 1 tablet (0.5 mg total) by mouth 3 (three) times daily as needed. For tremors 30 tablet 0  . benzonatate (TESSALON) 100 MG capsule Take 1 capsule (100 mg total) by mouth 3 (three) times daily as needed for cough. 20 capsule 3  . buPROPion (WELLBUTRIN SR) 150 MG 12 hr tablet Take 300 mg by mouth daily.   3  . citalopram (CELEXA) 40 MG tablet Take 40 mg by mouth every morning.     Marland Kitchen dexamethasone (DECADRON) 4 MG tablet Take 1 tablet (4 mg total) by mouth 2 (two) times daily. For one week, then, 2 mg twice daily for one week, then, 2 mg daily. 60 tablet 0  . Dextromethorphan-Guaifenesin (MUCINEX DM) 30-600 MG TB12 Take 2 tablets by mouth every 6 (six) hours as needed (for cough/congestion).    . fluconazole (DIFLUCAN) 100 MG tablet Take 1 tablet (100 mg total) by mouth daily. 7 tablet 3  . Fluticasone-Salmeterol (ADVAIR) 500-50 MCG/DOSE AEPB Inhale 2 puffs into the lungs 2 (two) times daily.     . furosemide (LASIX) 40 MG tablet Take 80 mg by mouth daily.     Marland Kitchen ipratropium-albuterol (DUONEB) 0.5-2.5 (3) MG/3ML SOLN Take 3 mLs by nebulization every 6 (six) hours as needed (  for wheezing. Take three times daily for  next 3 days). 360 mL 0  . levETIRAcetam (KEPPRA) 500 MG tablet Take 1 tablet (500 mg total) by mouth 2 (two) times daily. 60 tablet 5  . levothyroxine (SYNTHROID, LEVOTHROID) 175 MCG tablet Take 175 mcg by mouth daily before breakfast.    . LORazepam (ATIVAN) 0.5 MG tablet Take 0.5 mg by mouth 3 (three) times daily.  0  . montelukast (SINGULAIR) 10 MG tablet Take 10 mg by mouth at bedtime.     Marland Kitchen morphine (MS CONTIN) 15 MG 12 hr tablet Take 1 tablet (15 mg total) by mouth every 12 (twelve) hours. 45 tablet 0  . morphine (MSIR) 30 MG tablet Take 1 tablet (30 mg total) by mouth every 4 (four) hours as needed for severe pain. 30 tablet 0  . nystatin (MYCOSTATIN/NYSTOP) 100000  UNIT/GM POWD Apply 1 g topically 2 (two) times daily. To abdomen and groin 30 g 0  . omeprazole (PRILOSEC) 40 MG capsule Take 80 mg by mouth 2 (two) times daily.     . OXYGEN Inhale 3 L/min into the lungs every evening.     . potassium chloride SA (K-DUR,KLOR-CON) 20 MEQ tablet Take 1 tablet (20 mEq total) by mouth daily. 3 tablet 0  . sorbitol 70 % SOLN Take 30 mLs by mouth daily as needed for moderate constipation. 473 mL 0  . Tiotropium Bromide Monohydrate (SPIRIVA RESPIMAT) 2.5 MCG/ACT AERS Inhale 2 puffs into the lungs 2 (two) times daily.    . traZODone (DESYREL) 100 MG tablet Take 100 mg by mouth at bedtime as needed for sleep.     Marland Kitchen lidocaine-prilocaine (EMLA) cream Apply 1 application topically daily as needed. Reported on 08/18/2015    . prochlorperazine (COMPAZINE) 10 MG tablet Take 1 tablet (10 mg total) by mouth every 6 (six) hours as needed for nausea or vomiting. (Patient not taking: Reported on 08/18/2015) 30 tablet 2  . promethazine (PHENERGAN) 25 MG tablet Take 1 tablet (25 mg total) by mouth every 6 (six) hours as needed for nausea or vomiting. (Patient not taking: Reported on 08/18/2015) 30 tablet 1   No current facility-administered medications for this encounter.    Physical Findings: The patient is in no acute distress. Patient is alert and oriented.  weight is 239 lb 9.6 oz (108.682 kg). Her blood pressure is 112/76 and her pulse is 103. Her respiration is 18 and oxygen saturation is 100%. .  No significant changes. The patient has a follicular rash involving the left leg circumferentially from ankle to hip. The rash is not confined to a single dermatome.   Lab Findings: Lab Results  Component Value Date   WBC 7.4 08/07/2015   WBC 9.1 07/12/2015   HGB 9.4* 08/07/2015   HGB 11.1* 07/12/2015   HCT 31.6* 08/07/2015   HCT 35.4 07/12/2015   PLT 198 08/07/2015   PLT 135* 07/12/2015    Lab Results  Component Value Date   NA 141 08/08/2015   NA 140 08/07/2015   K  3.4* 08/08/2015   K 2.8* 08/07/2015   CHLORIDE 101 08/08/2015   CO2 27 08/08/2015   CO2 33* 08/07/2015   GLUCOSE 136 08/08/2015   GLUCOSE 94 08/07/2015   BUN 8.5 08/08/2015   BUN 10 08/07/2015   CREATININE 0.8 08/08/2015   CREATININE 0.66 08/07/2015   BILITOT 0.4 08/07/2015   BILITOT 0.87 07/12/2015   ALKPHOS 53 08/07/2015   ALKPHOS 54 07/12/2015   AST 14* 08/07/2015  AST 8 07/12/2015   ALT 17 08/07/2015   ALT 11 07/12/2015   PROT 5.8* 08/07/2015   PROT 6.3* 07/12/2015   ALBUMIN 2.8* 08/07/2015   ALBUMIN 3.3* 07/12/2015   CALCIUM 8.7 08/08/2015   CALCIUM 7.9* 08/07/2015   ANIONGAP 13* 08/08/2015   ANIONGAP 7 08/07/2015    Radiographic Findings: US Venous Img Lower Bilateral  08/07/2015  CLINICAL DATA:  Bilateral leg edema for 2 months. History of brain carcinoma and ovarian cancer. EXAM: BILATERAL LOWER EXTREMITY VENOUS DOPPLER ULTRASOUND TECHNIQUE: Gray-scale sonography with graded compression, as well as color Doppler and duplex ultrasound were performed to evaluate the lower extremity deep venous systems from the level of the common femoral vein and including the common femoral, femoral, profunda femoral, popliteal and calf veins including the posterior tibial, peroneal and gastrocnemius veins when visible. The superficial great saphenous vein was also interrogated. Spectral Doppler was utilized to evaluate flow at rest and with distal augmentation maneuvers in the common femoral, femoral and popliteal veins. COMPARISON:  None. FINDINGS: RIGHT LOWER EXTREMITY Common Femoral Vein: No evidence of thrombus. Normal compressibility, respiratory phasicity and response to augmentation. Saphenofemoral Junction: No evidence of thrombus. Normal compressibility and flow on color Doppler imaging. Profunda Femoral Vein: No evidence of thrombus. Normal compressibility and flow on color Doppler imaging. Femoral Vein: No evidence of thrombus. Normal compressibility, respiratory phasicity and  response to augmentation. Popliteal Vein: No evidence of thrombus. Normal compressibility, respiratory phasicity and response to augmentation. Calf Veins: No evidence of thrombus. Normal compressibility and flow on color Doppler imaging. Superficial Great Saphenous Vein: No evidence of thrombus. Normal compressibility and flow on color Doppler imaging. Venous Reflux:  None. Other Findings:  Nonspecific lower extremity subcutaneous edema. LEFT LOWER EXTREMITY Common Femoral Vein: No evidence of thrombus. Normal compressibility, respiratory phasicity and response to augmentation. Saphenofemoral Junction: No evidence of thrombus. Normal compressibility and flow on color Doppler imaging. Profunda Femoral Vein: No evidence of thrombus. Normal compressibility and flow on color Doppler imaging. Femoral Vein: No evidence of thrombus. Normal compressibility, respiratory phasicity and response to augmentation. Popliteal Vein: No evidence of thrombus. Normal compressibility, respiratory phasicity and response to augmentation. Calf Veins: Not well visualized. Superficial Great Saphenous Vein: No evidence of thrombus. Normal compressibility and flow on color Doppler imaging. Venous Reflux:  None. Other Findings:  Nonspecific lower extremity subcutaneous edema. IMPRESSION: No evidence of deep venous thrombosis. Electronically Signed   By: Lajean Manes M.D.   On: 08/07/2015 19:21   Dg Abd Acute W/chest  08/07/2015  CLINICAL DATA:  Constant mid epigastric pain.  Onset 2 days ago. EXAM: DG ABDOMEN ACUTE W/ 1V CHEST COMPARISON:  06/23/2015 FINDINGS: Right chest wall port a catheter is noted with tip in the projection of the SVC. Normal heart size. No pleural effusion or edema. Previous cholecystectomy. No abnormal dilatation of the large or small bowel loops. No fluid levels identified. IMPRESSION: 1. Nonobstructive bowel gas pattern. 2. No acute cardiopulmonary abnormalities. Electronically Signed   By: Kerby Moors M.D.    On: 08/07/2015 19:03    Impression:  The patient is recovering from the effects of radiation.    Plan:  Suggested for the patient to use neosporin on the open sores on her left leg to avoid bacterial infection.   Advised for the patient to discontinue dexamethasone at this time. If headaches continue, we will discuss restarting this medication.  Ms. Hector will continue to follow closely with medical oncology and hospice care. She will follow up in  radiation oncology as needed. _____________________________________  Sheral Apley Tammi Klippel, M.D.   This document serves as a record of services personally performed by Tyler Pita, MD. It was created on his behalf by Arlyce Harman, a trained medical scribe. The creation of this record is based on the scribe's personal observations and the provider's statements to them. This document has been checked and approved by the attending provider.

## 2015-08-19 ENCOUNTER — Other Ambulatory Visit: Payer: Self-pay

## 2015-08-19 ENCOUNTER — Telehealth: Payer: Self-pay | Admitting: *Deleted

## 2015-08-19 DIAGNOSIS — C562 Malignant neoplasm of left ovary: Principal | ICD-10-CM

## 2015-08-19 DIAGNOSIS — C561 Malignant neoplasm of right ovary: Secondary | ICD-10-CM

## 2015-08-19 DIAGNOSIS — C7931 Secondary malignant neoplasm of brain: Secondary | ICD-10-CM

## 2015-08-19 DIAGNOSIS — C563 Malignant neoplasm of bilateral ovaries: Secondary | ICD-10-CM

## 2015-08-19 MED ORDER — BENZONATATE 100 MG PO CAPS: 100.0000 mg | ORAL_CAPSULE | Freq: Three times a day (TID) | ORAL | Status: AC | PRN

## 2015-08-19 NOTE — Telephone Encounter (Signed)
Debra Simon Rutherford Limerick RN called with several questions: 1. Could we get benzonate refilled - already done by Baruch Merl RN 2. Could we get VO to bring out DNR form to patient ?   It is fine to bring out DNR form to patient when she is agreeable. 3. Patient mentioned getting legs wrapped.  Onyege does not have an order.  Per Dr. Mariana Kaufman note on 08/08/15 she does mention that we can "gently wrap legs with ace as patient cannot tolerate teds." 4. Patient appears to have shingles on knee to back of thigh.  As Dr. Marko Plume is not in office today, Onyege will contact Hospice MD Dr. Robin Searing for medication order.  She will call us back if there is any problem with that.

## 2015-08-23 ENCOUNTER — Telehealth: Payer: Self-pay | Admitting: Radiation Oncology

## 2015-08-23 NOTE — Telephone Encounter (Signed)
Received return call from [place], Delia of Hospice. She reports she received a call from Dr. Mariana Kaufman nurse with order to wrap left leg gently with ace wrap to help manage lymphedema. Expressed appreciation for the call and update. No orders needed since they were already given by Dr. Marko Plume.

## 2015-08-23 NOTE — Telephone Encounter (Signed)
Left message for Hospice nurse, Jac Canavan, RN, reference patient's pain/concern about increasing lymphedema in her left leg and inability to wear hose.

## 2015-08-29 ENCOUNTER — Telehealth: Payer: Self-pay

## 2015-08-29 NOTE — Telephone Encounter (Signed)
onyeje from hospice called requesting clarification on medications. Pt has xanax and ativan prescriptions. onyeje was making sure she was to have both. Pt currently has xanax and is out of ativan. She does have compazine and phenergan for nausea, she does have MS contin and MS IR for pain. Her main problem at present is lymphedema of legs. She is wrapping and elevating legs. Dr Lyman Speller wanted Dr Edwyna Shell to address the xanax/ativan issue. The ativan was Rx by London Pepper working with Dr Tammi Klippel on 08/09/15 for #90 Rx 1 tab TID. The pt is out of ativan. [name] said pt and mother were a bit confused about her medications. Marylen Ponto and she admitted to giving ativan 4 times per day not knowing rx was for tid. She is agreeable to no refill on the ativan until after Dr Edwyna Shell returns. She is using ativan for tremors mostly and that is also what xanax is for. Reiterated that xanax is TID if needed. Suanne Marker is using secura extra protective cream, and simethacon cream on her legs and peri region.

## 2015-09-01 ENCOUNTER — Emergency Department (HOSPITAL_COMMUNITY): Payer: Medicaid Other

## 2015-09-01 ENCOUNTER — Emergency Department (HOSPITAL_COMMUNITY)
Admission: EM | Admit: 2015-09-01 | Discharge: 2015-09-01 | Disposition: A | Discharge status: Home / Self Care | Payer: Medicaid Other | Attending: Emergency Medicine | Admitting: Emergency Medicine

## 2015-09-01 ENCOUNTER — Telehealth: Payer: Self-pay | Admitting: *Deleted

## 2015-09-01 ENCOUNTER — Encounter (HOSPITAL_COMMUNITY): Payer: Self-pay

## 2015-09-01 DIAGNOSIS — Z880 Allergy status to penicillin: Secondary | ICD-10-CM | POA: Diagnosis not present

## 2015-09-01 DIAGNOSIS — E039 Hypothyroidism, unspecified: Secondary | ICD-10-CM | POA: Insufficient documentation

## 2015-09-01 DIAGNOSIS — J449 Chronic obstructive pulmonary disease, unspecified: Secondary | ICD-10-CM | POA: Insufficient documentation

## 2015-09-01 DIAGNOSIS — Z8739 Personal history of other diseases of the musculoskeletal system and connective tissue: Secondary | ICD-10-CM | POA: Diagnosis not present

## 2015-09-01 DIAGNOSIS — R1110 Vomiting, unspecified: Secondary | ICD-10-CM | POA: Diagnosis not present

## 2015-09-01 DIAGNOSIS — F329 Major depressive disorder, single episode, unspecified: Secondary | ICD-10-CM | POA: Insufficient documentation

## 2015-09-01 DIAGNOSIS — K219 Gastro-esophageal reflux disease without esophagitis: Secondary | ICD-10-CM | POA: Diagnosis not present

## 2015-09-01 DIAGNOSIS — Z7951 Long term (current) use of inhaled steroids: Secondary | ICD-10-CM | POA: Insufficient documentation

## 2015-09-01 DIAGNOSIS — R197 Diarrhea, unspecified: Secondary | ICD-10-CM | POA: Diagnosis not present

## 2015-09-01 DIAGNOSIS — Z87891 Personal history of nicotine dependence: Secondary | ICD-10-CM | POA: Insufficient documentation

## 2015-09-01 DIAGNOSIS — W1839XA Other fall on same level, initial encounter: Secondary | ICD-10-CM | POA: Insufficient documentation

## 2015-09-01 DIAGNOSIS — Z8543 Personal history of malignant neoplasm of ovary: Secondary | ICD-10-CM | POA: Insufficient documentation

## 2015-09-01 DIAGNOSIS — F419 Anxiety disorder, unspecified: Secondary | ICD-10-CM | POA: Diagnosis not present

## 2015-09-01 DIAGNOSIS — W19XXXA Unspecified fall, initial encounter: Secondary | ICD-10-CM

## 2015-09-01 DIAGNOSIS — Y92009 Unspecified place in unspecified non-institutional (private) residence as the place of occurrence of the external cause: Secondary | ICD-10-CM | POA: Insufficient documentation

## 2015-09-01 DIAGNOSIS — R410 Disorientation, unspecified: Secondary | ICD-10-CM | POA: Diagnosis not present

## 2015-09-01 DIAGNOSIS — S0990XA Unspecified injury of head, initial encounter: Secondary | ICD-10-CM | POA: Diagnosis not present

## 2015-09-01 DIAGNOSIS — Z79899 Other long term (current) drug therapy: Secondary | ICD-10-CM | POA: Diagnosis not present

## 2015-09-01 DIAGNOSIS — M25552 Pain in left hip: Secondary | ICD-10-CM | POA: Diagnosis present

## 2015-09-01 DIAGNOSIS — L03116 Cellulitis of left lower limb: Secondary | ICD-10-CM | POA: Insufficient documentation

## 2015-09-01 DIAGNOSIS — Y9389 Activity, other specified: Secondary | ICD-10-CM | POA: Insufficient documentation

## 2015-09-01 DIAGNOSIS — Y998 Other external cause status: Secondary | ICD-10-CM | POA: Diagnosis not present

## 2015-09-01 DIAGNOSIS — G8929 Other chronic pain: Secondary | ICD-10-CM | POA: Insufficient documentation

## 2015-09-01 DIAGNOSIS — Z79891 Long term (current) use of opiate analgesic: Secondary | ICD-10-CM | POA: Diagnosis not present

## 2015-09-01 LAB — COMPREHENSIVE METABOLIC PANEL
ALK PHOS: 77 U/L (ref 38–126)
ALT: 10 U/L — AB (ref 14–54)
AST: 19 U/L (ref 15–41)
Albumin: 3 g/dL — ABNORMAL LOW (ref 3.5–5.0)
Anion gap: 12 (ref 5–15)
BILIRUBIN TOTAL: 0.8 mg/dL (ref 0.3–1.2)
CALCIUM: 8.3 mg/dL — AB (ref 8.9–10.3)
CHLORIDE: 98 mmol/L — AB (ref 101–111)
CO2: 29 mmol/L (ref 22–32)
CREATININE: 0.71 mg/dL (ref 0.44–1.00)
Glucose, Bld: 79 mg/dL (ref 65–99)
Potassium: 2.9 mmol/L — ABNORMAL LOW (ref 3.5–5.1)
Sodium: 139 mmol/L (ref 135–145)
TOTAL PROTEIN: 6.6 g/dL (ref 6.5–8.1)

## 2015-09-01 LAB — I-STAT CG4 LACTIC ACID, ED
LACTIC ACID, VENOUS: 1.46 mmol/L (ref 0.5–2.0)
Lactic Acid, Venous: 0.74 mmol/L (ref 0.5–2.0)

## 2015-09-01 LAB — CBC WITH DIFFERENTIAL/PLATELET
BASOS ABS: 0 10*3/uL (ref 0.0–0.1)
Basophils Relative: 1 %
EOS PCT: 3 %
Eosinophils Absolute: 0.1 10*3/uL (ref 0.0–0.7)
HEMATOCRIT: 36.9 % (ref 36.0–46.0)
Hemoglobin: 11.4 g/dL — ABNORMAL LOW (ref 12.0–15.0)
LYMPHS ABS: 0.6 10*3/uL — AB (ref 0.7–4.0)
LYMPHS PCT: 26 %
MCH: 27.7 pg (ref 26.0–34.0)
MCHC: 30.9 g/dL (ref 30.0–36.0)
MCV: 89.6 fL (ref 78.0–100.0)
MONO ABS: 0.3 10*3/uL (ref 0.1–1.0)
MONOS PCT: 11 %
NEUTROS ABS: 1.4 10*3/uL — AB (ref 1.7–7.7)
Neutrophils Relative %: 59 %
PLATELETS: 284 10*3/uL (ref 150–400)
RBC: 4.12 MIL/uL (ref 3.87–5.11)
RDW: 16.9 % — AB (ref 11.5–15.5)
WBC: 2.4 10*3/uL — ABNORMAL LOW (ref 4.0–10.5)

## 2015-09-01 LAB — URINALYSIS, ROUTINE W REFLEX MICROSCOPIC
BILIRUBIN URINE: NEGATIVE
GLUCOSE, UA: NEGATIVE mg/dL
HGB URINE DIPSTICK: NEGATIVE
Ketones, ur: NEGATIVE mg/dL
Leukocytes, UA: NEGATIVE
Nitrite: NEGATIVE
PROTEIN: NEGATIVE mg/dL
Specific Gravity, Urine: 1.004 — ABNORMAL LOW (ref 1.005–1.030)
pH: 5.5 (ref 5.0–8.0)

## 2015-09-01 LAB — LIPASE, BLOOD: LIPASE: 21 U/L (ref 11–51)

## 2015-09-01 MED ORDER — SODIUM CHLORIDE 0.9 % IV BOLUS (SEPSIS)
500.0000 mL | Freq: Once | INTRAVENOUS | Status: AC
  Administered 2015-09-01: 500 mL via INTRAVENOUS

## 2015-09-01 MED ORDER — HYDROMORPHONE HCL 1 MG/ML IJ SOLN
1.0000 mg | Freq: Once | INTRAMUSCULAR | Status: AC
  Administered 2015-09-01: 1 mg via INTRAVENOUS
  Filled 2015-09-01: qty 1

## 2015-09-01 MED ORDER — HYDROMORPHONE HCL 1 MG/ML IJ SOLN
0.5000 mg | Freq: Once | INTRAMUSCULAR | Status: AC
  Administered 2015-09-01: 0.5 mg via INTRAVENOUS
  Filled 2015-09-01: qty 1

## 2015-09-01 MED ORDER — CLINDAMYCIN HCL 300 MG PO CAPS
300.0000 mg | ORAL_CAPSULE | Freq: Once | ORAL | Status: AC
  Administered 2015-09-01: 300 mg via ORAL
  Filled 2015-09-01: qty 1

## 2015-09-01 MED ORDER — DOXYCYCLINE HYCLATE 100 MG PO CAPS: 100.0000 mg | ORAL_CAPSULE | Freq: Two times a day (BID) | ORAL | Status: AC

## 2015-09-01 MED ORDER — SODIUM CHLORIDE 0.9 % IV SOLN
Freq: Once | INTRAVENOUS | Status: AC
  Administered 2015-09-01: 16:00:00 via INTRAVENOUS

## 2015-09-01 MED ORDER — PROMETHAZINE HCL 25 MG/ML IJ SOLN
12.5000 mg | Freq: Once | INTRAMUSCULAR | Status: AC
  Administered 2015-09-01: 12.5 mg via INTRAVENOUS
  Filled 2015-09-01: qty 1

## 2015-09-01 MED ORDER — POTASSIUM CHLORIDE CRYS ER 20 MEQ PO TBCR
40.0000 meq | EXTENDED_RELEASE_TABLET | Freq: Once | ORAL | Status: AC
  Administered 2015-09-01: 40 meq via ORAL
  Filled 2015-09-01: qty 2

## 2015-09-01 MED ORDER — CLINDAMYCIN HCL 150 MG PO CAPS: 300.0000 mg | ORAL_CAPSULE | Freq: Four times a day (QID) | ORAL | Status: DC

## 2015-09-01 NOTE — Discharge Instructions (Signed)
You were seen today regarding the fall you had at home.  It does not appear that you broke anything or have any significant injury.  It appears that the skin of your left leg is infected.  Take the antibiotic prescribed and have this reevaluated by your primary care physician.  Use your walker to ambulate and try to limit activity on your own as you continue to be unsteady on your feet.  Continue to work with your home nurses and with hospice.    Cellulitis Cellulitis is an infection of the skin and the tissue beneath it. The infected area is usually red and tender. Cellulitis occurs most often in the arms and lower legs.  CAUSES  Cellulitis is caused by bacteria that enter the skin through cracks or cuts in the skin. The most common types of bacteria that cause cellulitis are staphylococci and streptococci. SIGNS AND SYMPTOMS   Redness and warmth.  Swelling.  Tenderness or pain.  Fever. DIAGNOSIS  Your health care provider can usually determine what is wrong based on a physical exam. Blood tests may also be done. TREATMENT  Treatment usually involves taking an antibiotic medicine. HOME CARE INSTRUCTIONS   Take your antibiotic medicine as directed by your health care provider. Finish the antibiotic even if you start to feel better.  Keep the infected arm or leg elevated to reduce swelling.  Apply a warm cloth to the affected area up to 4 times per day to relieve pain.  Take medicines only as directed by your health care provider.  Keep all follow-up visits as directed by your health care provider. SEEK MEDICAL CARE IF:   You notice red streaks coming from the infected area.  Your red area gets larger or turns dark in color.  Your bone or joint underneath the infected area becomes painful after the skin has healed.  Your infection returns in the same area or another area.  You notice a swollen bump in the infected area.  You develop new symptoms.  You have a fever. SEEK  IMMEDIATE MEDICAL CARE IF:   You feel very sleepy.  You develop vomiting or diarrhea.  You have a general ill feeling (malaise) with muscle aches and pains.   This information is not intended to replace advice given to you by your health care provider. Make sure you discuss any questions you have with your health care provider.   Document Released: 05/16/2005 Document Revised: 04/27/2015 Document Reviewed: 10/22/2011 Elsevier Interactive Patient Education 2016 [place] in the Home  Falls can cause injuries and can affect people from all age groups. There are many simple things that you can do to make your home safe and to help prevent falls. WHAT CAN I DO ON THE OUTSIDE OF MY HOME?  Regularly repair the edges of walkways and driveways and fix any cracks.  Remove high doorway thresholds.  Trim any shrubbery on the main path into your home.  Use bright outdoor lighting.  Clear walkways of debris and clutter, including tools and rocks.  Regularly check that handrails are securely fastened and in good repair. Both sides of any steps should have handrails.  Install guardrails along the edges of any raised decks or porches.  Have leaves, snow, and ice cleared regularly.  Use sand or salt on walkways during winter months.  In the garage, clean up any spills right away, including grease or oil spills. WHAT CAN I DO IN THE BATHROOM?  Use night lights.  Install grab bars by the toilet and in the tub and shower. Do not use towel bars as grab bars.  Use non-skid mats or decals on the floor of the tub or shower.  If you need to sit down while you are in the shower, use a plastic, non-slip stool.Marland Kitchen  Keep the floor dry. Immediately clean up any water that spills on the floor.  Remove soap buildup in the tub or shower on a regular basis.  Attach bath mats securely with double-sided non-slip rug tape.  Remove throw rugs and other tripping hazards from the  floor. WHAT CAN I DO IN THE BEDROOM?  Use night lights.  Make sure that a bedside light is easy to reach.  Do not use oversized bedding that drapes onto the floor.  Have a firm chair that has side arms to use for getting dressed.  Remove throw rugs and other tripping hazards from the floor. WHAT CAN I DO IN THE KITCHEN?   Clean up any spills right away.  Avoid walking on wet floors.  Place frequently used items in easy-to-reach places.  If you need to reach for something above you, use a sturdy step stool that has a grab bar.  Keep electrical cables out of the way.  Do not use floor polish or wax that makes floors slippery. If you have to use wax, make sure that it is non-skid floor wax.  Remove throw rugs and other tripping hazards from the floor. WHAT CAN I DO IN THE STAIRWAYS?  Do not leave any items on the stairs.  Make sure that there are handrails on both sides of the stairs. Fix handrails that are broken or loose. Make sure that handrails are as long as the stairways.  Check any carpeting to make sure that it is firmly attached to the stairs. Fix any carpet that is loose or worn.  Avoid having throw rugs at the top or bottom of stairways, or secure the rugs with carpet tape to prevent them from moving.  Make sure that you have a light switch at the top of the stairs and the bottom of the stairs. If you do not have them, have them installed. WHAT ARE SOME OTHER FALL PREVENTION TIPS?  Wear closed-toe shoes that fit well and support your feet. Wear shoes that have rubber soles or low heels.  When you use a stepladder, make sure that it is completely opened and that the sides are firmly locked. Have someone hold the ladder while you are using it. Do not climb a closed stepladder.  Add color or contrast paint or tape to grab bars and handrails in your home. Place contrasting color strips on the first and last steps.  Use mobility aids as needed, such as canes, walkers,  scooters, and crutches.  Turn on lights if it is dark. Replace any light bulbs that burn out.  Set up furniture so that there are clear paths. Keep the furniture in the same spot.  Fix any uneven floor surfaces.  Choose a carpet design that does not hide the edge of steps of a stairway.  Be aware of any and all pets.  Review your medicines with your healthcare provider. Some medicines can cause dizziness or changes in blood pressure, which increase your risk of falling. Talk with your health care provider about other ways that you can decrease your risk of falls. This may include working with a physical therapist or trainer to improve your strength, balance, and endurance.  This information is not intended to replace advice given to you by your health care provider. Make sure you discuss any questions you have with your health care provider.   Document Released: 07/27/2002 Document Revised: 12/21/2014 Document Reviewed: 09/10/2014 Elsevier Interactive Patient Education [employer].

## 2015-09-01 NOTE — ED Notes (Signed)
Patient transported to CT 

## 2015-09-01 NOTE — ED Provider Notes (Signed)
CSN: [id number]     Arrival date & time 09/01/15  1409 History   First MD Initiated Contact with Patient 09/01/15 1502     Chief Complaint  Patient presents with  . Fall  . Hip Pain     (Consider location/radiation/quality/duration/timing/severity/associated sxs/prior Treatment) HPI Comments: 45 y.o. Female with history of Ovarian CA with metastatic disease, COPD, ulcerative colitis presents for hip pain and recurrent falls.  The patient reports that she has been unstable on her feet for multiple days even with her walker and that she has been having multiple falls at home.  She reports over the last three days she has also been having increasing headache as well as diarrhea and vomiting.  Denies fever or chills.  No vision changes.  She says that today she fell and was unable to get up and was having pain in her left hip and so her mother called EMS.  The patient has not had chemo treatment since January 2016 and says that she was told she was too sick for further treatment at [facility].  She says that she was supposed to have an oncology appointment today at 2:30 regarding possible treatment but was unable to make it secondary to the fall.  Per patient's mother.  The patient has had balance issues, headaches, and confusion chronically.  The patient has been on hospice for the last 1 month.     Past Medical History  Diagnosis Date  . Asthma   . Ulcerative colitis   . Anxiety   . Depression   . Chronic back pain   . Chronic knee pain   . Thyroid disease     hypo  . GERD (gastroesophageal reflux disease)   . Insomnia   . Allergic rhinitis   . Arthritis   . COPD (chronic obstructive pulmonary disease) ([place])   . Mouth pain     jaw and mouth pain 06-13-15  . Cancer [facility])     Ovarian cancer 10'15- Dr. Curlene Dolphin follows. Chemo last 1'16, additional planned .  Marland Kitchen Chronic respiratory failure ([place]): 3L [place] 06/23/2015   Past Surgical History  Procedure Laterality Date  . Knee surgery      x3,  two on left and one on right.  Arthoscopy  . Cholecystectomy    . Abdominal hysterectomy    . Cesarean section    . Tonsillectomy    . Joint replacement      2x on left, 1x on right  . Portacath placement Right     06-13-15 remains inplace  . Multiple extractions with alveoloplasty N/A 06/14/2015    Procedure: Extraction of tooth #'s 2, 4-14, 20-29 with alveoloplasty;  Surgeon: Lenn Cal, DDS;  Location: [facility];  Service: Oral Surgery;  Laterality: N/A;   Family History  Problem Relation Age of Onset  . High blood pressure Mother   . Bipolar disorder Mother   . Hypothyroidism Mother   . Rheum arthritis Mother   . Breast cancer Mother   . Ovarian cancer Mother   . Asthma Father   . Parkinson's disease Father   . CAD Father    Social History  Substance Use Topics  . Smoking status: Former Smoker -- 1.00 packs/day for 3 years    Types: Cigarettes    Quit date: 10/19/2011  . Smokeless tobacco: Never Used  . Alcohol Use: No     Comment: rarely    OB History    No data available     Review  of Systems  Constitutional: Negative for fever, chills, appetite change and fatigue.  HENT: Negative for congestion, ear pain, postnasal drip, rhinorrhea, sinus pressure and voice change.   Eyes: Negative for pain, redness and visual disturbance.  Respiratory: Negative for cough, chest tightness and shortness of breath.   Cardiovascular: Negative for chest pain, palpitations and leg swelling.  Gastrointestinal: Negative for nausea, vomiting, abdominal pain and diarrhea.  Genitourinary: Negative for dysuria, urgency, frequency and flank pain.  Musculoskeletal: Negative for myalgias, back pain, neck pain and neck stiffness.  Skin: Negative for rash.  Neurological: Positive for headaches. Negative for dizziness, seizures, syncope, speech difficulty and weakness.       Unsteady gait  Hematological: Does not bruise/bleed easily.      Allergies  Zofran; Penicillins; and  Sulfamethoxazole-trimethoprim  Home Medications   Prior to Admission medications   Medication Sig Start Date End Date Taking? Authorizing Provider  albuterol (PROAIR HFA) 108 (90 BASE) MCG/ACT inhaler Inhale 2 puffs into the lungs every 4 (four) hours as needed for wheezing or shortness of breath. 06/30/15  Yes Nita Sells, MD  ALPRAZolam Duanne Moron) 0.5 MG tablet Take 1 tablet (0.5 mg total) by mouth 3 (three) times daily as needed. For tremors 08/08/15  Yes Lennis Marion Downer, MD  benzonatate (TESSALON) 100 MG capsule Take 1 capsule (100 mg total) by mouth 3 (three) times daily as needed for cough. 08/19/15  Yes Lennis Marion Downer, MD  buPROPion (WELLBUTRIN SR) 150 MG 12 hr tablet Take 150 mg by mouth 2 (two) times daily.  06/21/15  Yes Historical Provider, MD  citalopram (CELEXA) 40 MG tablet Take 40 mg by mouth every morning.    Yes Historical Provider, MD  Fluticasone-Salmeterol (ADVAIR) 500-50 MCG/DOSE AEPB Inhale 2 puffs into the lungs 2 (two) times daily.    Yes Historical Provider, MD  furosemide (LASIX) 40 MG tablet Take 40 mg by mouth 2 (two) times daily.    Yes Historical Provider, MD  ipratropium-albuterol (DUONEB) 0.5-2.5 (3) MG/3ML SOLN Take 3 mLs by nebulization every 6 (six) hours as needed (for wheezing. Take three times daily for  next 3 days). 02/28/14  Yes Shanker Kristeen Mans, MD  levETIRAcetam (KEPPRA) 500 MG tablet Take 1 tablet (500 mg total) by mouth 2 (two) times daily. 07/20/15  Yes Tyler Pita, MD  levothyroxine (SYNTHROID, LEVOTHROID) 175 MCG tablet Take 175 mcg by mouth daily before breakfast.   Yes Historical Provider, MD  lidocaine-prilocaine (EMLA) cream Apply 1 application topically daily as needed. Reported on 08/18/2015 07/07/14  Yes Historical Provider, MD  montelukast (SINGULAIR) 10 MG tablet Take 10 mg by mouth daily.    Yes Historical Provider, MD  morphine (MS CONTIN) 30 MG 12 hr tablet Take 30 mg by mouth 2 (two) times daily. 08/17/15  Yes Historical  Provider, MD  morphine (MSIR) 15 MG tablet Take 15 mg by mouth 2 (two) times daily as needed for moderate pain.  08/17/15  Yes Historical Provider, MD  nystatin (MYCOSTATIN/NYSTOP) 100000 UNIT/GM POWD Apply 1 g topically 2 (two) times daily. To abdomen and groin 08/08/15  Yes Lennis Marion Downer, MD  omeprazole (PRILOSEC) 40 MG capsule Take 80 mg by mouth 2 (two) times daily.    Yes Historical Provider, MD  prochlorperazine (COMPAZINE) 10 MG tablet Take 1 tablet (10 mg total) by mouth every 6 (six) hours as needed for nausea or vomiting. 07/27/15  Yes Lennis Marion Downer, MD  promethazine (PHENERGAN) 25 MG tablet Take 1 tablet (25 mg total) by  mouth every 6 (six) hours as needed for nausea or vomiting. 05/27/15  Yes Lennis Marion Downer, MD  sorbitol 70 % SOLN Take 30 mLs by mouth daily as needed for moderate constipation. 06/30/15  Yes Nita Sells, MD  Tiotropium Bromide Monohydrate (SPIRIVA RESPIMAT) 2.5 MCG/ACT AERS Inhale 2 puffs into the lungs 2 (two) times daily.   Yes Historical Provider, MD  traZODone (DESYREL) 100 MG tablet Take 100 mg by mouth at bedtime as needed for sleep.    Yes Historical Provider, MD  dexamethasone (DECADRON) 4 MG tablet Take 1 tablet (4 mg total) by mouth 2 (two) times daily. For one week, then, 2 mg twice daily for one week, then, 2 mg daily. 07/21/15   Tyler Pita, MD  doxycycline (VIBRAMYCIN) 100 MG capsule Take 1 capsule (100 mg total) by mouth 2 (two) times daily. 09/01/15   Harvel Quale, MD  fluconazole (DIFLUCAN) 100 MG tablet Take 1 tablet (100 mg total) by mouth daily. 08/18/15   Tyler Pita, MD  morphine (MS CONTIN) 15 MG 12 hr tablet Take 1 tablet (15 mg total) by mouth every 12 (twelve) hours. 08/08/15   Lennis Marion Downer, MD  morphine (MSIR) 30 MG tablet Take 1 tablet (30 mg total) by mouth every 4 (four) hours as needed for severe pain. 08/08/15   Lennis Marion Downer, MD  OXYGEN Inhale 3 L/min into the lungs every evening.     Historical Provider, MD   potassium chloride SA (K-DUR,KLOR-CON) 20 MEQ tablet Take 1 tablet (20 mEq total) by mouth daily. 08/07/15   Malvin Johns, MD   BP 105/53 mmHg  Pulse 87  Temp(Src) 98.4 F (36.9 C) (Oral)  Resp 18  SpO2 96% Physical Exam  Constitutional: She is oriented to person, place, and time. She appears well-developed and well-nourished. No distress.  HENT:  Head: Normocephalic and atraumatic.  Right Ear: External ear normal.  Left Ear: External ear normal.  Nose: Nose normal.  Mouth/Throat: Oropharynx is clear and moist. No oropharyngeal exudate.  Eyes: EOM are normal. Pupils are equal, round, and reactive to light.  Neck: Normal range of motion. Neck supple.  Cardiovascular: Normal rate, regular rhythm, normal heart sounds and intact distal pulses.   No murmur heard. Pulmonary/Chest: Effort normal. No respiratory distress. She has no wheezes. She has no rales. She exhibits no tenderness.  Abdominal: Soft. She exhibits no distension. There is no tenderness.  Musculoskeletal: Normal range of motion. She exhibits edema (bilateral, symmetric, 2+ pitting). She exhibits no tenderness.  Neurological: She is alert and oriented to person, place, and time. No cranial nerve deficit. She exhibits normal muscle tone.  Skin: Skin is warm and dry. No rash noted. She is not diaphoretic. There is erythema (mild in the folds of the bilateral hip areas as well as erythema with warmth of the left lower extremity).  Vitals reviewed.   ED Course  Procedures (including critical care time) Labs Review Labs Reviewed  CBC WITH DIFFERENTIAL/PLATELET - Abnormal; Notable for the following:    WBC 2.4 (*)    Hemoglobin 11.4 (*)    RDW 16.9 (*)    Neutro Abs 1.4 (*)    Lymphs Abs 0.6 (*)    All other components within normal limits  COMPREHENSIVE METABOLIC PANEL - Abnormal; Notable for the following:    Potassium 2.9 (*)    Chloride 98 (*)    BUN <5 (*)    Calcium 8.3 (*)    Albumin 3.0 (*)  ALT 10 (*)     All other components within normal limits  URINALYSIS, ROUTINE W REFLEX MICROSCOPIC (NOT AT [facility]) - Abnormal; Notable for the following:    Specific Gravity, Urine 1.004 (*)    All other components within normal limits  LIPASE, BLOOD  I-STAT CG4 LACTIC ACID, ED  I-STAT CG4 LACTIC ACID, ED    Imaging Review Dg Chest 2 View  09/01/2015  CLINICAL DATA:  Unsteady gait.  Brain metastases.  Witnessed fall. EXAM: CHEST - 2 VIEW COMPARISON:  Acute abdominal series 08/07/2015. FINDINGS: The heart size is exaggerated by low lung volumes. Mild interstitial coarsening is chronic. There is no focal airspace disease. A right IJ Port-A-Cath is stable. There is no edema or effusion to suggest failure. Emphysematous changes are present. The visualized soft tissues and bony thorax are unremarkable. IMPRESSION: 1. No acute cardiopulmonary disease or significant interval change. 2. Stable right IJ Port-A-Cath. Electronically Signed   By: San Morelle M.D.   On: 09/01/2015 16:10   Ct Head Wo Contrast  09/01/2015  CLINICAL DATA:  Witnessed fall at home, poor cord may shin with a walker, orthostatics hypotension, diarrhea, ovarian and brain cancer EXAM: CT HEAD WITHOUT CONTRAST CT CERVICAL SPINE WITHOUT CONTRAST TECHNIQUE: Multidetector CT imaging of the head and cervical spine was performed following the standard protocol without intravenous contrast. Multiplanar CT image reconstructions of the cervical spine were also generated. COMPARISON:  CT head 06/23/2015, CT cervical spine 04/01/2013 FINDINGS: CT HEAD FINDINGS Normal ventricular morphology. No midline shift. Previously identified large cystic lesion at RIGHT temporal lobe has decreased in size now 3.0 x 2.0 cm image 13 previously 4.6 x 3.4 cm. Previously identified cystic lesion at high RIGHT frontal lobe now appears higher in attenuation and much smaller with resolution of cystic component, overall now measuring 1.3 x 0.8 cm previously 3.8 x 2.6 cm.  Decreased vasogenic white matter edema versus previous exam. Lesion seen previously at LEFT centrum semiovale no longer identified. No intracranial hemorrhage, new mass lesion or evidence acute infarction. No extra-axial fluid collections. Mucosal thickening RIGHT maxillary sinus. Chronic thickening and heterogeneity of RIGHT frontal bone appears unchanged 04/01/2013. Bones and sinuses otherwise unremarkable. CT CERVICAL SPINE FINDINGS Scattered motion artifacts degrade image quality notably at the odontoid process and C2 vertebral body. Additional beam hardening artifacts from the patient shoulders. Diffuse osseous demineralization. Visualized skullbase intact. Within limitations of patient motion no gross evidence of cervical spine fracture or bone destruction seen. IMPRESSION: Marked interval decreases in sizes of previously identified cystic cerebral metastases. The 2 RIGHT hemispheric lesions now show areas of higher attenuation which likely represent developing calcification ; the decrease in sizes and lack of surrounding edema make these unlikely to represent areas of acute hemorrhage. Decreased scattered white matter vasogenic edema. No new intracranial abnormalities. No gross acute cervical spine abnormality identified on exam limited by patient motion. Electronically Signed   By: Lavonia Dana M.D.   On: 09/01/2015 16:14   Ct Cervical Spine Wo Contrast  09/01/2015  CLINICAL DATA:  Witnessed fall at home, poor cord may shin with a walker, orthostatics hypotension, diarrhea, ovarian and brain cancer EXAM: CT HEAD WITHOUT CONTRAST CT CERVICAL SPINE WITHOUT CONTRAST TECHNIQUE: Multidetector CT imaging of the head and cervical spine was performed following the standard protocol without intravenous contrast. Multiplanar CT image reconstructions of the cervical spine were also generated. COMPARISON:  CT head 06/23/2015, CT cervical spine 04/01/2013 FINDINGS: CT HEAD FINDINGS Normal ventricular morphology. No  midline shift. Previously identified  large cystic lesion at RIGHT temporal lobe has decreased in size now 3.0 x 2.0 cm image 13 previously 4.6 x 3.4 cm. Previously identified cystic lesion at high RIGHT frontal lobe now appears higher in attenuation and much smaller with resolution of cystic component, overall now measuring 1.3 x 0.8 cm previously 3.8 x 2.6 cm. Decreased vasogenic white matter edema versus previous exam. Lesion seen previously at LEFT centrum semiovale no longer identified. No intracranial hemorrhage, new mass lesion or evidence acute infarction. No extra-axial fluid collections. Mucosal thickening RIGHT maxillary sinus. Chronic thickening and heterogeneity of RIGHT frontal bone appears unchanged 04/01/2013. Bones and sinuses otherwise unremarkable. CT CERVICAL SPINE FINDINGS Scattered motion artifacts degrade image quality notably at the odontoid process and C2 vertebral body. Additional beam hardening artifacts from the patient shoulders. Diffuse osseous demineralization. Visualized skullbase intact. Within limitations of patient motion no gross evidence of cervical spine fracture or bone destruction seen. IMPRESSION: Marked interval decreases in sizes of previously identified cystic cerebral metastases. The 2 RIGHT hemispheric lesions now show areas of higher attenuation which likely represent developing calcification ; the decrease in sizes and lack of surrounding edema make these unlikely to represent areas of acute hemorrhage. Decreased scattered white matter vasogenic edema. No new intracranial abnormalities. No gross acute cervical spine abnormality identified on exam limited by patient motion. Electronically Signed   By: Lavonia Dana M.D.   On: 09/01/2015 16:14   Dg Hip Unilat With Pelvis 2-3 Views Left  09/01/2015  CLINICAL DATA:  Status post fall today. Left hip pain. Initial encounter. EXAM: DG HIP (WITH OR WITHOUT PELVIS) 2-3V LEFT COMPARISON:  None. FINDINGS: There is no evidence of  hip fracture or dislocation. There is no evidence of arthropathy or other focal bone abnormality. IMPRESSION: Negative exam. Electronically Signed   By: Inge Rise M.D.   On: 09/01/2015 14:59   I have personally reviewed and evaluated these images and lab results as part of my medical decision-making.   EKG Interpretation None      MDM  Patient was seen and evaluated in stable condition.  Physical examination consistent with cellulitis of the left lower extremity.  Hypokalemia supplemented.  Imaging negative for acute process.  Patient able to ambulate at her baseline in the department.  Patient afebrile.  Normal vitals.  Patient's mother and patient felt comfortable with discharge.  Patient was discharged home in stable condition with doxycycline for cellulitis secondary to patient's multiple allergies.  Patient is to be seen at home by visiting hospice nurse tomorrow.  Strict return precautions given. Final diagnoses:  Fall, initial encounter  Cellulitis of left lower extremity    1. Fall, chronic gait instability  2. Cellulitis, left lower extremity    Harvel Quale, MD 09/02/15 0151

## 2015-09-01 NOTE — Telephone Encounter (Signed)
PATIENT'S MOTHER CALLED WITH EMS IN THE HOME REPORTING Jolynda HAS BRAIN AND OVARIAN CANCER.  REPEATED FALLS SO ON THE WAY TO HOSPITAL.  Today she fell, hit her left side from the waist down where the lymphedema is.  Hospice instructed me to call EMS if she fell again.  Has memory lapse, headache the last three days, upset stomach, cough with phlegm, using oxygen, trying to learn to walk and sways as she can't balance."  Will notify on-call provider of this call.  Dr. Marko Plume not in office today.

## 2015-09-01 NOTE — ED Notes (Signed)
Per EMS, pt from home.  Pt is alert and oriented.  Pt normally ambulates with walker.  Pt had witness fall.  Poor coordination with walker.  Orthostatic hypotension by EMS. 130 lying to upper 90 sitting.  Diarrhea x 4 days.  Pt lives with family.  Pt has cancer stage 4 brain and ovarian.  Pt with port. Current under chemo.  400NS in route.  20 g Rt hand.  Vitals:  132/92, hr 82, NSR, zofran 4 mg in route.  Continues with dizziness.  On hip...  Pain is on left side.  No rotation or shortening noted.  Pt does have lymphedema on that side.

## 2015-09-01 NOTE — ED Notes (Signed)
Rn Accessing IV or [employer]

## 2015-09-02 NOTE — Telephone Encounter (Signed)
Spoke with [place]ett [place], RN with Hospice and told her the suggestion noted below by Dr. Marko Plume. [name] spoke with the patient and family this morning and they are in agreement to using the xanax only.

## 2015-09-02 NOTE — Telephone Encounter (Signed)
Better to use only xanax OR ativan, not both. If hospice feels she is doing ok now with just xanax, should just use that.  thanks

## 2015-09-05 ENCOUNTER — Telehealth: Payer: Self-pay | Admitting: Oncology

## 2015-09-05 ENCOUNTER — Telehealth: Payer: Self-pay | Admitting: *Deleted

## 2015-09-05 ENCOUNTER — Other Ambulatory Visit: Payer: Self-pay | Admitting: Oncology

## 2015-09-05 NOTE — Telephone Encounter (Signed)
Call from Hospice nurse asking if refill on pain medications has been processed.  First request made by patient's mother.  Hospice reports patient is out of medication.  "Will bring this up at Team Meeting this week as I saw a bottle with the PCP name, Dr. Holley Raring with [facility] in [place].  Mom has called other provider and Dr. Marko Plume is the attending for Hospice."

## 2015-09-05 NOTE — Telephone Encounter (Signed)
Mother called stating patient is out of both of her morphine pills. Needs refills. Is a Hospice patient

## 2015-09-05 NOTE — Telephone Encounter (Signed)
Medical Oncology  Multiple requests to Hospice for pain medication today. Hospice RN called [place] with concerns about multiple physicians involved and difficulty tracking the narcotics.  [place] RN spoke with [facility] [address], prescriptions for MSContin 30 mg bid #60 and MSIR 15 mg #30 filled from Dr Lindwood Qua prescriptions 08-17-15. There were additional prescriptions from this office written 08-12-15.  See RN note also.  This MD called to let patient/ family know that no additional prescriptions will be given this pm  Spoke with patient's daughter as patient's mother not available. Told daughter that if patient needs additional medication tonight, she would need to go to [facility] ED. Per daughter, patient has apt with Dr Holley Raring 1030 tomorrow.  We will let Hospice and Dr Lindwood Qua office know situation.  Godfrey Pick, MD

## 2015-09-05 NOTE — Telephone Encounter (Signed)
Told [name] that Dr. Marko Plume spoke with patient's daughter as noted below. Told her that Dr. Marko Plume wants Hospice or herself to prescribe narcotics/controlled substances only.  Note made in Epic under Patient Care Coordination Note on face page of patient EMR. Requested that Methadone protocol be discussed at [place] Wednesday 09-07-15 with Dr. Lyman Speller per Dr. Marko Plume.  Tried to reach Dr. Lindwood Qua office to tell them about narcotic/controlled prescriptions to be written by Hospice or Dr. Marko Plume as patient has an appointment there tomorrow at 1030. Office [phone]. Office open 8 am to 6pm. Waited in phone que and then disconnected x 2.  Will call tomorrow am.

## 2015-09-06 NOTE — Telephone Encounter (Signed)
Called Dr. Patrina Levering office ~ 8:50 am.  Not able to speak directly to his nurse but left a deatied message as noted below by Dr. Marko Plume as well as refaxing her note to [phone].  This is to the [facility] where her appointment is today.

## 2015-09-09 ENCOUNTER — Telehealth: Payer: Self-pay

## 2015-09-09 NOTE — Telephone Encounter (Signed)
Spoke with mother [name]  to remind her of appointments for 09-12-15  for Genetics at 1400, lab 1515, flush 1530, and Dr. Marko Plume at 1600. Mother aware of appointments. Told her that she and her daughter can discuss further pain management  at the visit.   Vivia [place] that Hospice nurse [place] saw Ms. Kory  today and said that she has 4  MS Contin 15 mg tabs left.  She was given # 12 on 09-05-15 by Hospice physician on call. Per [name]. Patient denied being in pain at visit and felt good.   This will be enough to get her to Sunday night dose.   [name] verbalized understanding.

## 2015-09-09 NOTE — Telephone Encounter (Signed)
Note: [name] stated that is is going to be off the next 2 weeks.  Bradly Bienenstock, RN will be  Nurse following Ms. Mcbrien.  She will make visit on Fridays as [name] has done unless family calls to change visit or patient needs to be seen sooner.

## 2015-09-11 ENCOUNTER — Other Ambulatory Visit: Payer: Self-pay | Admitting: Oncology

## 2015-09-11 DIAGNOSIS — C562 Malignant neoplasm of left ovary: Principal | ICD-10-CM

## 2015-09-11 DIAGNOSIS — C563 Malignant neoplasm of bilateral ovaries: Secondary | ICD-10-CM

## 2015-09-11 DIAGNOSIS — C561 Malignant neoplasm of right ovary: Secondary | ICD-10-CM

## 2015-09-12 ENCOUNTER — Encounter: Payer: Self-pay | Admitting: Genetic Counselor

## 2015-09-12 ENCOUNTER — Ambulatory Visit: Payer: Self-pay | Admitting: Oncology

## 2015-09-12 ENCOUNTER — Other Ambulatory Visit: Payer: Self-pay

## 2015-09-14 ENCOUNTER — Telehealth: Payer: Self-pay

## 2015-09-14 DIAGNOSIS — C7931 Secondary malignant neoplasm of brain: Secondary | ICD-10-CM

## 2015-09-14 DIAGNOSIS — C562 Malignant neoplasm of left ovary: Principal | ICD-10-CM

## 2015-09-14 DIAGNOSIS — C563 Malignant neoplasm of bilateral ovaries: Secondary | ICD-10-CM

## 2015-09-14 DIAGNOSIS — C561 Malignant neoplasm of right ovary: Secondary | ICD-10-CM

## 2015-09-14 DIAGNOSIS — F4323 Adjustment disorder with mixed anxiety and depressed mood: Secondary | ICD-10-CM

## 2015-09-14 NOTE — Telephone Encounter (Signed)
Re pain meds  Sounds like she has enough until tomorrow, so I can do scripts when in office then. Agree no more than 1 week supply.  Thank you

## 2015-09-14 NOTE — Telephone Encounter (Signed)
Bradly Bienenstock, RN called and stated that she saw  Ms. Wolters late yesterday. Ms. Speese had 10 tablets of MSIR 30 mg left.  She is taking 1 tab q 4 hrs  ATC. Ms. Neill told Barnetta Chapel that she was comfortable when she took MS Contin 15 mg q 12 hrs and MSIR 30 mg in between for breakthrough.  Ms. Olmeda reported that she would use on average 2-3 MSIR 30 mg in a day.  Ms. Helms is out of her xanax  0.5mg  which she uses tid.  She has been using her mother's xanax supply. Barnetta Chapel suggests a weeks supply of pain medication with dysfunctional confusion in the home.  She is going to see the patient again on Friday and do a pill count as well.

## 2015-09-15 ENCOUNTER — Encounter (HOSPITAL_COMMUNITY): Payer: Self-pay | Admitting: Emergency Medicine

## 2015-09-15 ENCOUNTER — Emergency Department (HOSPITAL_COMMUNITY)
Admission: EM | Admit: 2015-09-15 | Discharge: 2015-09-15 | Disposition: A | Discharge status: Home / Self Care | Payer: Medicaid Other | Attending: Emergency Medicine | Admitting: Emergency Medicine

## 2015-09-15 ENCOUNTER — Emergency Department (HOSPITAL_COMMUNITY): Payer: Medicaid Other

## 2015-09-15 ENCOUNTER — Emergency Department (HOSPITAL_BASED_OUTPATIENT_CLINIC_OR_DEPARTMENT_OTHER)
Admit: 2015-09-15 | Discharge: 2015-09-15 | Disposition: A | Discharge status: Home / Self Care | Payer: Medicaid Other | Attending: Emergency Medicine | Admitting: Emergency Medicine

## 2015-09-15 DIAGNOSIS — F329 Major depressive disorder, single episode, unspecified: Secondary | ICD-10-CM | POA: Insufficient documentation

## 2015-09-15 DIAGNOSIS — J441 Chronic obstructive pulmonary disease with (acute) exacerbation: Secondary | ICD-10-CM | POA: Diagnosis not present

## 2015-09-15 DIAGNOSIS — C799 Secondary malignant neoplasm of unspecified site: Secondary | ICD-10-CM

## 2015-09-15 DIAGNOSIS — R609 Edema, unspecified: Secondary | ICD-10-CM | POA: Insufficient documentation

## 2015-09-15 DIAGNOSIS — G8929 Other chronic pain: Secondary | ICD-10-CM | POA: Insufficient documentation

## 2015-09-15 DIAGNOSIS — Z8739 Personal history of other diseases of the musculoskeletal system and connective tissue: Secondary | ICD-10-CM | POA: Insufficient documentation

## 2015-09-15 DIAGNOSIS — R42 Dizziness and giddiness: Secondary | ICD-10-CM | POA: Diagnosis not present

## 2015-09-15 DIAGNOSIS — Z79899 Other long term (current) drug therapy: Secondary | ICD-10-CM | POA: Insufficient documentation

## 2015-09-15 DIAGNOSIS — C569 Malignant neoplasm of unspecified ovary: Secondary | ICD-10-CM | POA: Diagnosis not present

## 2015-09-15 DIAGNOSIS — R197 Diarrhea, unspecified: Secondary | ICD-10-CM | POA: Insufficient documentation

## 2015-09-15 DIAGNOSIS — K219 Gastro-esophageal reflux disease without esophagitis: Secondary | ICD-10-CM | POA: Insufficient documentation

## 2015-09-15 DIAGNOSIS — F419 Anxiety disorder, unspecified: Secondary | ICD-10-CM | POA: Diagnosis not present

## 2015-09-15 DIAGNOSIS — E039 Hypothyroidism, unspecified: Secondary | ICD-10-CM | POA: Diagnosis not present

## 2015-09-15 DIAGNOSIS — Z7951 Long term (current) use of inhaled steroids: Secondary | ICD-10-CM | POA: Insufficient documentation

## 2015-09-15 DIAGNOSIS — F17210 Nicotine dependence, cigarettes, uncomplicated: Secondary | ICD-10-CM | POA: Diagnosis not present

## 2015-09-15 DIAGNOSIS — R1084 Generalized abdominal pain: Secondary | ICD-10-CM | POA: Diagnosis not present

## 2015-09-15 DIAGNOSIS — R0602 Shortness of breath: Secondary | ICD-10-CM | POA: Diagnosis present

## 2015-09-15 DIAGNOSIS — G4700 Insomnia, unspecified: Secondary | ICD-10-CM | POA: Insufficient documentation

## 2015-09-15 DIAGNOSIS — J9610 Chronic respiratory failure, unspecified whether with hypoxia or hypercapnia: Secondary | ICD-10-CM | POA: Diagnosis not present

## 2015-09-15 DIAGNOSIS — Z880 Allergy status to penicillin: Secondary | ICD-10-CM | POA: Diagnosis not present

## 2015-09-15 DIAGNOSIS — Z9981 Dependence on supplemental oxygen: Secondary | ICD-10-CM | POA: Insufficient documentation

## 2015-09-15 DIAGNOSIS — C7931 Secondary malignant neoplasm of brain: Secondary | ICD-10-CM | POA: Insufficient documentation

## 2015-09-15 LAB — CBC WITH DIFFERENTIAL/PLATELET
BASOS ABS: 0 10*3/uL (ref 0.0–0.1)
Basophils Relative: 1 %
EOS PCT: 7 %
Eosinophils Absolute: 0.2 10*3/uL (ref 0.0–0.7)
HEMATOCRIT: 33.2 % — AB (ref 36.0–46.0)
Hemoglobin: 10.1 g/dL — ABNORMAL LOW (ref 12.0–15.0)
LYMPHS ABS: 0.4 10*3/uL — AB (ref 0.7–4.0)
LYMPHS PCT: 12 %
MCH: 26.9 pg (ref 26.0–34.0)
MCHC: 30.4 g/dL (ref 30.0–36.0)
MCV: 88.3 fL (ref 78.0–100.0)
Monocytes Absolute: 0.3 10*3/uL (ref 0.1–1.0)
Monocytes Relative: 9 %
NEUTROS ABS: 2.6 10*3/uL (ref 1.7–7.7)
Neutrophils Relative %: 71 %
Platelets: 328 10*3/uL (ref 150–400)
RBC: 3.76 MIL/uL — AB (ref 3.87–5.11)
RDW: 16.6 % — ABNORMAL HIGH (ref 11.5–15.5)
WBC: 3.6 10*3/uL — AB (ref 4.0–10.5)

## 2015-09-15 LAB — BRAIN NATRIURETIC PEPTIDE: B NATRIURETIC PEPTIDE 5: 44.7 pg/mL (ref 0.0–100.0)

## 2015-09-15 LAB — COMPREHENSIVE METABOLIC PANEL
ALT: 10 U/L — AB (ref 14–54)
AST: 18 U/L (ref 15–41)
Albumin: 3.1 g/dL — ABNORMAL LOW (ref 3.5–5.0)
Alkaline Phosphatase: 82 U/L (ref 38–126)
Anion gap: 10 (ref 5–15)
CHLORIDE: 99 mmol/L — AB (ref 101–111)
CO2: 27 mmol/L (ref 22–32)
CREATININE: 0.84 mg/dL (ref 0.44–1.00)
Calcium: 8.6 mg/dL — ABNORMAL LOW (ref 8.9–10.3)
Glucose, Bld: 88 mg/dL (ref 65–99)
POTASSIUM: 3.2 mmol/L — AB (ref 3.5–5.1)
SODIUM: 136 mmol/L (ref 135–145)
Total Bilirubin: 0.7 mg/dL (ref 0.3–1.2)
Total Protein: 6.6 g/dL (ref 6.5–8.1)

## 2015-09-15 LAB — TROPONIN I: Troponin I: <0.03 ng/mL (ref <0.031)

## 2015-09-15 MED ORDER — OXYCODONE-ACETAMINOPHEN 5-325 MG PO TABS
2.0000 | ORAL_TABLET | Freq: Once | ORAL | Status: AC
  Administered 2015-09-15: 2 via ORAL
  Filled 2015-09-15: qty 2

## 2015-09-15 MED ORDER — HYDROMORPHONE HCL 1 MG/ML IJ SOLN
1.0000 mg | Freq: Once | INTRAMUSCULAR | Status: AC
Start: 2015-09-15 — End: 2015-09-15
  Administered 2015-09-15: 1 mg via INTRAVENOUS

## 2015-09-15 MED ORDER — MORPHINE SULFATE 30 MG PO TABS: ORAL_TABLET | ORAL | Status: DC

## 2015-09-15 MED ORDER — HYDROMORPHONE HCL 1 MG/ML IJ SOLN
1.0000 mg | Freq: Once | INTRAMUSCULAR | Status: AC
  Administered 2015-09-15: 1 mg via INTRAVENOUS
  Filled 2015-09-15: qty 1

## 2015-09-15 MED ORDER — HYDROMORPHONE HCL 1 MG/ML IJ SOLN
INTRAMUSCULAR | Status: AC
  Administered 2015-09-15: 1 mg via INTRAVENOUS
  Filled 2015-09-15: qty 1

## 2015-09-15 MED ORDER — ALPRAZOLAM 0.5 MG PO TABS: 0.5000 mg | ORAL_TABLET | Freq: Three times a day (TID) | ORAL | Status: DC | PRN

## 2015-09-15 MED ORDER — MORPHINE SULFATE (PF) 10 MG/ML IV SOLN
10.0000 mg | Freq: Once | INTRAVENOUS | Status: AC
  Administered 2015-09-15: 10 mg via INTRAVENOUS
  Filled 2015-09-15: qty 1

## 2015-09-15 MED ORDER — DEXAMETHASONE SODIUM PHOSPHATE 10 MG/ML IJ SOLN
10.0000 mg | Freq: Once | INTRAMUSCULAR | Status: AC
  Administered 2015-09-15: 10 mg via INTRAVENOUS
  Filled 2015-09-15: qty 1

## 2015-09-15 MED ORDER — MORPHINE SULFATE ER 15 MG PO TBCR: 15.0000 mg | EXTENDED_RELEASE_TABLET | Freq: Two times a day (BID) | ORAL | Status: DC

## 2015-09-15 NOTE — ED Notes (Signed)
Pt calling out for pain medication.  Will notify EDP.

## 2015-09-15 NOTE — Progress Notes (Signed)
*  PRELIMINARY RESULTS* Vascular Ultrasound Lower extremity venous duplex has been completed.  Preliminary findings: technically limited due to body habitus. Poor visualization of veins. However, no obvious DVT is identified.  Landry Mellow, RDMS, RVT   09/15/2015, 7:01 PM

## 2015-09-15 NOTE — Telephone Encounter (Signed)
LM for [facility] with Hospice that Dr. Marko Plume is prescribing the following for One week supply. No refills before 09-22-15 MS Contin 15 mg every 12 hours for pain # 14  MSIR  30 mg 1 tablet every 6-8 hours as needed for breakthrough pain. #28 Xanax 0.5 mg  1 tablet tid as needed for tremors. #21 Faxed prescriptions to [place] [facility] in [place].  Requested that [facility] call PCP Dr. Lindwood Qua office and let them know that Hospice is managing all pain .  No rerills from his office without discussing with Hospice or Dr. Marko Plume.

## 2015-09-15 NOTE — ED Provider Notes (Signed)
CSN: [id number]     Arrival date & time 09/15/15  1341 History   First MD Initiated Contact with Patient 09/15/15 1408     Chief Complaint  Patient presents with  . Shortness of Breath    Patient is a 45 y.o. female presenting with shortness of breath. The history is provided by the patient.  Shortness of Breath Associated symptoms: abdominal pain, cough, fever, headaches and wheezing   Associated symptoms: no chest pain    patient presents with shortness of breath and feeling bad. Has a history of asthma. 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 She is on hospice but states she is a full code and there still treating her. Reviewing the notes it looks as if they have had some difficulty getting her pain medicines right she's been taking her mother's medications also. Patient states she has increased swelling or legs. States she has had a rough month so far and she has had ulcerative colitis flare more swelling in her legs.  Past Medical History  Diagnosis Date  . Asthma   . Ulcerative colitis   . Anxiety   . Depression   . Chronic back pain   . Chronic knee pain   . Thyroid disease     hypo  . GERD (gastroesophageal reflux disease)   . Insomnia   . Allergic rhinitis   . Arthritis   . COPD (chronic obstructive pulmonary disease) ([place])   . Mouth pain     jaw and mouth pain 06-13-15  . Cancer [facility])     Ovarian cancer 10'15- Dr. Curlene Dolphin follows. Chemo last 1'16, additional planned .  Marland Kitchen Chronic respiratory failure ([place]): 3L [place] 06/23/2015   Past Surgical History  Procedure Laterality Date  . Knee surgery      x3, two on left and one on right.  Arthoscopy  . Cholecystectomy    . Abdominal hysterectomy    . Cesarean section    . Tonsillectomy    . Joint replacement      2x on left, 1x on right  . Portacath placement Right     06-13-15 remains inplace  . Multiple extractions with alveoloplasty N/A 06/14/2015    Procedure: Extraction of tooth #'s 2, 4-14, 20-29 with  alveoloplasty;  Surgeon: Lenn Cal, DDS;  Location: [facility];  Service: Oral Surgery;  Laterality: N/A;   Family History  Problem Relation Age of Onset  . High blood pressure Mother   . Bipolar disorder Mother   . Hypothyroidism Mother   . Rheum arthritis Mother   . Breast cancer Mother   . Ovarian cancer Mother   . Asthma Father   . Parkinson's disease Father   . CAD Father    Social History  Substance Use Topics  . Smoking status: Former Smoker -- 1.00 packs/day for 3 years    Types: Cigarettes    Quit date: 10/19/2011  . Smokeless tobacco: Current User  . Alcohol Use: No     Comment: rarely    OB History    No data available     Review of Systems  Constitutional: Positive for fever, appetite change and fatigue.  Respiratory: Positive for cough, shortness of breath and wheezing.   Cardiovascular: Positive for leg swelling. Negative for chest pain.  Gastrointestinal: Positive for abdominal pain and diarrhea.  Genitourinary: Negative for dyspareunia.  Musculoskeletal: Negative for back pain.  Skin: Negative for wound.  Neurological: Positive for light-headedness and headaches.  Allergies  Zofran; Penicillins; and Sulfamethoxazole-trimethoprim  Home Medications   Prior to Admission medications   Medication Sig Start Date End Date Taking? Authorizing Provider  albuterol (PROAIR HFA) 108 (90 BASE) MCG/ACT inhaler Inhale 2 puffs into the lungs every 4 (four) hours as needed for wheezing or shortness of breath. 06/30/15  Yes Nita Sells, MD  ALPRAZolam Duanne Moron) 0.5 MG tablet Take 1 tablet (0.5 mg total) by mouth 3 (three) times daily as needed (For tremors). For tremors 09/15/15  Yes Lennis Marion Downer, MD  benzonatate (TESSALON) 100 MG capsule Take 1 capsule (100 mg total) by mouth 3 (three) times daily as needed for cough. 08/19/15  Yes Lennis Marion Downer, MD  buPROPion (WELLBUTRIN SR) 150 MG 12 hr tablet Take 150 mg by mouth 2 (two) times daily.  06/21/15   Yes Historical Provider, MD  citalopram (CELEXA) 40 MG tablet Take 40 mg by mouth every morning.    Yes Historical Provider, MD  Fluticasone-Salmeterol (ADVAIR) 500-50 MCG/DOSE AEPB Inhale 2 puffs into the lungs 2 (two) times daily.    Yes Historical Provider, MD  furosemide (LASIX) 40 MG tablet Take 40 mg by mouth 2 (two) times daily.    Yes Historical Provider, MD  ipratropium-albuterol (DUONEB) 0.5-2.5 (3) MG/3ML SOLN Take 3 mLs by nebulization every 6 (six) hours as needed (for wheezing. Take three times daily for  next 3 days). 02/28/14  Yes Shanker Kristeen Mans, MD  levETIRAcetam (KEPPRA) 500 MG tablet Take 1 tablet (500 mg total) by mouth 2 (two) times daily. 07/20/15  Yes Tyler Pita, MD  levothyroxine (SYNTHROID, LEVOTHROID) 175 MCG tablet Take 175 mcg by mouth daily before breakfast.   Yes Historical Provider, MD  lidocaine-prilocaine (EMLA) cream Apply 1 application topically daily as needed. Reported on 08/18/2015 07/07/14  Yes Historical Provider, MD  montelukast (SINGULAIR) 10 MG tablet Take 10 mg by mouth daily.    Yes Historical Provider, MD  morphine (MS CONTIN) 15 MG 12 hr tablet Take 1 tablet (15 mg total) by mouth every 12 (twelve) hours. 09/15/15  Yes Lennis Marion Downer, MD  nystatin (MYCOSTATIN/NYSTOP) 100000 UNIT/GM POWD Apply 1 g topically 2 (two) times daily. To abdomen and groin 08/08/15  Yes Lennis Marion Downer, MD  omeprazole (PRILOSEC) 40 MG capsule Take 40-80 mg by mouth 2 (two) times daily.    Yes Historical Provider, MD  OXYGEN Inhale 3-4 L/min into the lungs every evening.    Yes Historical Provider, MD  prochlorperazine (COMPAZINE) 10 MG tablet Take 1 tablet (10 mg total) by mouth every 6 (six) hours as needed for nausea or vomiting. 07/27/15  Yes Lennis Marion Downer, MD  promethazine (PHENERGAN) 25 MG tablet Take 1 tablet (25 mg total) by mouth every 6 (six) hours as needed for nausea or vomiting. 05/27/15  Yes Lennis Marion Downer, MD  sorbitol 70 % SOLN Take 30 mLs by mouth daily  as needed for moderate constipation. 06/30/15  Yes Nita Sells, MD  traZODone (DESYREL) 100 MG tablet Take 100 mg by mouth at bedtime as needed for sleep.    Yes Historical Provider, MD  dexamethasone (DECADRON) 4 MG tablet Take 1 tablet (4 mg total) by mouth 2 (two) times daily. For one week, then, 2 mg twice daily for one week, then, 2 mg daily. Patient not taking: Reported on 09/15/2015 07/21/15   Tyler Pita, MD  doxycycline (VIBRAMYCIN) 100 MG capsule Take 1 capsule (100 mg total) by mouth 2 (two) times daily. Patient not taking: Reported on 09/15/2015  09/01/15   Harvel Quale, MD  fluconazole (DIFLUCAN) 100 MG tablet Take 1 tablet (100 mg total) by mouth daily. Patient not taking: Reported on 09/15/2015 08/18/15   Tyler Pita, MD  morphine (MSIR) 30 MG tablet Take 1 tablet every 6- 8 hrs as needed for breakthrough pain 09/15/15   Lennis Marion Downer, MD  potassium chloride SA (K-DUR,KLOR-CON) 20 MEQ tablet Take 1 tablet (20 mEq total) by mouth daily. Patient not taking: Reported on 09/15/2015 08/07/15   Malvin Johns, MD   BP 115/65 mmHg  Pulse 88  Temp(Src) 98.1 F (36.7 C) (Oral)  Resp 13  SpO2 95% Physical Exam  Constitutional: She appears well-developed.  HENT:  Head: Atraumatic.  Neck: Neck supple.  Cardiovascular: Normal rate.   Pulmonary/Chest: Effort normal. She has no wheezes.  Abdominal: Soft. There is tenderness.  Mild diffuse tenderness  Musculoskeletal: She exhibits tenderness.  Moderate pitting edema to bilateral lower extremities.  Neurological: She is alert.  Skin: Skin is warm.    ED Course  Procedures (including critical care time) Labs Review Labs Reviewed  COMPREHENSIVE METABOLIC PANEL  BRAIN NATRIURETIC PEPTIDE  CBC WITH DIFFERENTIAL/PLATELET  TROPONIN I    Imaging Review Dg Chest Portable 1 View  09/15/2015  CLINICAL DATA:  Shortness of Breath EXAM: PORTABLE CHEST 1 VIEW COMPARISON:  09/01/2015 FINDINGS: Cardiomediastinal silhouette is  stable. No acute infiltrate or pleural effusion. No pulmonary edema. Right IJ Port-A-Cath is unchanged in position. IMPRESSION: No active disease.  Stable right IJ Port-A-Cath position. Electronically Signed   By: Lahoma Crocker M.D.   On: 09/15/2015 14:43   I have personally reviewed and evaluated these images and lab results as part of my medical decision-making.   EKG Interpretation   Date/Time:  Thursday September 15 2015 14:21:09 EST Ventricular Rate:  91 PR Interval:  157 QRS Duration: 106 QT Interval:  455 QTC Calculation: 560 R Axis:   64 Text Interpretation:  Sinus rhythm Low voltage, precordial leads Prolonged  QT interval Confirmed by Alvino Chapel  MD, Ovid Curd [phone]) on 09/15/2015  2:33:12 PM      MDM   Final diagnoses:  Chronic respiratory failure, unspecified whether with hypoxia or hypercapnia ([place])  Metastatic cancer [facility])    Patient presented with some shortness of breath. Also multiple complaints of pain all over. Feels moderately better after some pain medicine. X-ray reassuring lab work still pending. Patient states she feels better she thinks she can go back home. She does have hospice following her. She has an appointment with her oncologist tomorrow.    Davonna Belling, MD 09/15/15 [phone]

## 2015-09-15 NOTE — ED Notes (Addendum)
Per [facility] pt reports increased shortness of breath and cough x 2 weeks, Hx brain and ovarian cancer. Received neb yet persistent wheezing, pt received a duo nebulizer . O2 norms are 85-90 RA per pt . Pt denies chest pain nor SOB at this time. Per EMS pt is still wheezing. Alert and oriented x 4. Hx COPD . Pt had 4 mg Zofran IV by EMS

## 2015-09-15 NOTE — ED Notes (Signed)
Bed: RESB Expected date:  Expected time:  Means of arrival:  Comments: EMS- [place] Pt, SOB/Fever

## 2015-09-15 NOTE — ED Provider Notes (Addendum)
Patient with bilateral leg swelling left greater than right.  Patient will undergo venous duplex imaging of her bilateral lower extremities to evaluate for DVT given her leg swelling and active cancer.  She feels better at this time after DuoNeb.  Much of this may represent reactive airway disease/asthma exacerbation.  Patient be given a dose of Decadron in the ER.  Bilateral venous duplexes without gross DVT noted on either side.  Some limitation secondary to habitus.  Legs are swollen and likely consistent with lymphedema/peripheral edema  Jola Schmidt, MD 09/15/15 [place], MD 09/15/15 1949

## 2015-09-15 NOTE — Progress Notes (Signed)
Pt seen by Marzetta Board, Staff of [facility]

## 2015-09-15 NOTE — Progress Notes (Signed)
[facility] ED[facility]-RN Visit  Patient seen in room with mother, Suanne Marker at bedside.  Patient lethargic, but responds to voice.  Her mother stated she activated EMS, when patient started coughing and could not catch her breath.  [facility] was not notified until patient was already in ED.  Encouraged mother and patient to call [facility], prior to calling EMS, so [facility] can assist with symptom management.  [facility] medications were reconciled with Dr. Mariana Kaufman office today and mother is to pick up prescriptions when patient is discharged from the ED.  Updated [facility] medication list given to bedside RN.  [facility] RN scheduled to see patient in the morning.    Please call with any questions.  Thank you, Freddi Starr RN, [facility] Liaison [phone]

## 2015-09-20 ENCOUNTER — Telehealth: Payer: Self-pay

## 2015-09-20 NOTE — Telephone Encounter (Signed)
Catherine,RN stated that she saw Debra [name] yesterday.   She was not using her O2.  Pain level was 6-8/10. Since 09-16-15 ED visit, she has only used 3 MS Contin 15 mg tabs. MSIR 30 mg tabs count correct for using 3 in 24 hours. She has used Xanax 0.5 mg # 14 with 7 left. Debra [name] feels that the medications are not being diverted. The  mother does not understand the medication instructions. Debra [name] wanted to fill pill boxes for the mother to give to [name] at appropriate times. Mother Debra [name] did not want to do that right now.  Debra [name] stated that she was going to work on this issue  with mother. Pain medications and Xanax not to be filled prior to 09-22-15

## 2015-09-22 ENCOUNTER — Telehealth: Payer: Self-pay

## 2015-09-22 DIAGNOSIS — C563 Malignant neoplasm of bilateral ovaries: Secondary | ICD-10-CM

## 2015-09-22 DIAGNOSIS — F4323 Adjustment disorder with mixed anxiety and depressed mood: Secondary | ICD-10-CM

## 2015-09-22 DIAGNOSIS — C562 Malignant neoplasm of left ovary: Principal | ICD-10-CM

## 2015-09-22 DIAGNOSIS — C7931 Secondary malignant neoplasm of brain: Secondary | ICD-10-CM

## 2015-09-22 DIAGNOSIS — C561 Malignant neoplasm of right ovary: Secondary | ICD-10-CM

## 2015-09-22 MED ORDER — ALPRAZOLAM 0.5 MG PO TABS: 0.5000 mg | ORAL_TABLET | Freq: Three times a day (TID) | ORAL | Status: DC | PRN

## 2015-09-22 MED ORDER — MORPHINE SULFATE ER 15 MG PO TBCR: 15.0000 mg | EXTENDED_RELEASE_TABLET | Freq: Two times a day (BID) | ORAL | Status: DC

## 2015-09-22 MED ORDER — MORPHINE SULFATE 30 MG PO TABS: 30.0000 mg | ORAL_TABLET | Freq: Three times a day (TID) | ORAL | Status: DC | PRN

## 2015-09-22 NOTE — Telephone Encounter (Signed)
lvm on home and cell that pt does not have appt at [facility] on Monday, and that the pain and xanax prescriptions were faxed to pharmacy.

## 2015-09-22 NOTE — Telephone Encounter (Signed)
Scarlette Ar an RN with hospice [place] called. The pt needs refills on MS contin, MS IR and xanax. Barnetta Chapel was able to write out a schedule and the pt took her pills appropriately this week. The pill counts were correct. The pt states her pain is 6-8/10. The number she quotes has not changed but the pt states she is feeling better. Barnetta Chapel stated pt had an appt on Monday but none is seen in computer.

## 2015-09-23 ENCOUNTER — Other Ambulatory Visit: Payer: Self-pay

## 2015-09-23 DIAGNOSIS — C562 Malignant neoplasm of left ovary: Secondary | ICD-10-CM

## 2015-09-23 DIAGNOSIS — G40109 Localization-related (focal) (partial) symptomatic epilepsy and epileptic syndromes with simple partial seizures, not intractable, without status epilepticus: Secondary | ICD-10-CM

## 2015-09-23 DIAGNOSIS — E079 Disorder of thyroid, unspecified: Secondary | ICD-10-CM

## 2015-09-23 DIAGNOSIS — C7931 Secondary malignant neoplasm of brain: Secondary | ICD-10-CM

## 2015-09-23 DIAGNOSIS — C563 Malignant neoplasm of bilateral ovaries: Secondary | ICD-10-CM

## 2015-09-23 DIAGNOSIS — C561 Malignant neoplasm of right ovary: Secondary | ICD-10-CM

## 2015-09-23 MED ORDER — LEVETIRACETAM 500 MG PO TABS: 500.0000 mg | ORAL_TABLET | Freq: Two times a day (BID) | ORAL | Status: AC

## 2015-09-23 MED ORDER — FUROSEMIDE 40 MG PO TABS: 40.0000 mg | ORAL_TABLET | Freq: Two times a day (BID) | ORAL | Status: DC

## 2015-09-23 MED ORDER — LEVOTHYROXINE SODIUM 175 MCG PO TABS: 175.0000 ug | ORAL_TABLET | Freq: Every day | ORAL | Status: DC

## 2015-09-23 NOTE — Telephone Encounter (Signed)
Patient Demographics     Patient Name Sex DOB SSN Address Phone    Debra [name], Debra [name] Female 1971/07/25 [id number]  [place] [address] [phone] [facility]) [phone] (Mobile) *Preferred*      meds needing refill  Received: Yesterday    Janace Hoard, RN  Baruch Merl, RN           Bradly Bienenstock RN with hospice, stated pt will be needing refill on lasix, keppra, and synthroid on Monday. She was under the impression pt was to see [facility] on Monday but that is incorrect.

## 2015-09-23 NOTE — Telephone Encounter (Signed)
Told Bradly Bienenstock, RN that refills sent for 3 medications noted below.

## 2015-09-25 ENCOUNTER — Encounter (HOSPITAL_COMMUNITY): Payer: Self-pay | Admitting: Emergency Medicine

## 2015-09-25 ENCOUNTER — Emergency Department (HOSPITAL_COMMUNITY): Payer: Medicaid Other

## 2015-09-25 ENCOUNTER — Emergency Department (HOSPITAL_COMMUNITY)
Admission: EM | Admit: 2015-09-25 | Discharge: 2015-09-25 | Disposition: A | Discharge status: Home / Self Care | Payer: Medicaid Other | Attending: Emergency Medicine | Admitting: Emergency Medicine

## 2015-09-25 DIAGNOSIS — Y9289 Other specified places as the place of occurrence of the external cause: Secondary | ICD-10-CM | POA: Diagnosis not present

## 2015-09-25 DIAGNOSIS — Y998 Other external cause status: Secondary | ICD-10-CM | POA: Diagnosis not present

## 2015-09-25 DIAGNOSIS — F419 Anxiety disorder, unspecified: Secondary | ICD-10-CM | POA: Diagnosis not present

## 2015-09-25 DIAGNOSIS — Z87891 Personal history of nicotine dependence: Secondary | ICD-10-CM | POA: Diagnosis not present

## 2015-09-25 DIAGNOSIS — Z8543 Personal history of malignant neoplasm of ovary: Secondary | ICD-10-CM | POA: Insufficient documentation

## 2015-09-25 DIAGNOSIS — E039 Hypothyroidism, unspecified: Secondary | ICD-10-CM | POA: Diagnosis not present

## 2015-09-25 DIAGNOSIS — G8929 Other chronic pain: Secondary | ICD-10-CM | POA: Insufficient documentation

## 2015-09-25 DIAGNOSIS — Z880 Allergy status to penicillin: Secondary | ICD-10-CM | POA: Insufficient documentation

## 2015-09-25 DIAGNOSIS — Z8739 Personal history of other diseases of the musculoskeletal system and connective tissue: Secondary | ICD-10-CM | POA: Diagnosis not present

## 2015-09-25 DIAGNOSIS — W010XXA Fall on same level from slipping, tripping and stumbling without subsequent striking against object, initial encounter: Secondary | ICD-10-CM | POA: Diagnosis not present

## 2015-09-25 DIAGNOSIS — Z79899 Other long term (current) drug therapy: Secondary | ICD-10-CM | POA: Insufficient documentation

## 2015-09-25 DIAGNOSIS — J449 Chronic obstructive pulmonary disease, unspecified: Secondary | ICD-10-CM | POA: Diagnosis not present

## 2015-09-25 DIAGNOSIS — W19XXXA Unspecified fall, initial encounter: Secondary | ICD-10-CM

## 2015-09-25 DIAGNOSIS — Z7951 Long term (current) use of inhaled steroids: Secondary | ICD-10-CM | POA: Insufficient documentation

## 2015-09-25 DIAGNOSIS — F329 Major depressive disorder, single episode, unspecified: Secondary | ICD-10-CM | POA: Diagnosis not present

## 2015-09-25 DIAGNOSIS — S29001A Unspecified injury of muscle and tendon of front wall of thorax, initial encounter: Secondary | ICD-10-CM | POA: Diagnosis present

## 2015-09-25 DIAGNOSIS — Y9389 Activity, other specified: Secondary | ICD-10-CM | POA: Insufficient documentation

## 2015-09-25 DIAGNOSIS — K219 Gastro-esophageal reflux disease without esophagitis: Secondary | ICD-10-CM | POA: Insufficient documentation

## 2015-09-25 DIAGNOSIS — R0781 Pleurodynia: Secondary | ICD-10-CM

## 2015-09-25 MED ORDER — IPRATROPIUM-ALBUTEROL 0.5-2.5 (3) MG/3ML IN SOLN
3.0000 mL | Freq: Once | RESPIRATORY_TRACT | Status: AC
  Administered 2015-09-25: 3 mL via RESPIRATORY_TRACT
  Filled 2015-09-25: qty 3

## 2015-09-25 MED ORDER — MORPHINE SULFATE (PF) 2 MG/ML IV SOLN
2.0000 mg | Freq: Once | INTRAVENOUS | Status: DC
  Filled 2015-09-25: qty 1

## 2015-09-25 MED ORDER — MORPHINE SULFATE (PF) 2 MG/ML IV SOLN
2.0000 mg | Freq: Once | INTRAVENOUS | Status: AC
  Administered 2015-09-25: 2 mg via INTRAMUSCULAR

## 2015-09-25 MED ORDER — MORPHINE SULFATE (PF) 4 MG/ML IV SOLN
4.0000 mg | Freq: Once | INTRAVENOUS | Status: AC
  Administered 2015-09-25: 4 mg via INTRAVENOUS
  Filled 2015-09-25: qty 1

## 2015-09-25 NOTE — Discharge Instructions (Signed)
Chest Wall Pain °Chest wall pain is pain in or around the bones and muscles of your chest. Sometimes, an injury causes this pain. Sometimes, the cause may not be known. This pain may take several weeks or longer to get better. °HOME CARE °Pay attention to any changes in your symptoms. Take these actions to help with your pain: °· Rest as told by your doctor. °· Avoid activities that cause pain. Try not to use your chest, belly (abdominal), or side muscles to lift heavy things. °· If directed, apply ice to the painful area: °¨ Put ice in a plastic bag. °¨ Place a towel between your skin and the bag. °¨ Leave the ice on for 20 minutes, 2-3 times per day. °· Take over-the-counter and prescription medicines only as told by your doctor. °· Do not use tobacco products, including cigarettes, chewing tobacco, and e-cigarettes. If you need help quitting, ask your doctor. °· Keep all follow-up visits as told by your doctor. This is important. °GET HELP IF: °· You have a fever. °· Your chest pain gets worse. °· You have new symptoms. °GET HELP RIGHT AWAY IF: °· You feel sick to your stomach (nauseous) or you throw up (vomit). °· You feel sweaty or light-headed. °· You have a cough with phlegm (sputum) or you cough up blood. °· You are short of breath. °  °This information is not intended to replace advice given to you by your health care provider. Make sure you discuss any questions you have with your health care provider. °  °Document Released: 01/23/2008 Document Revised: 04/27/2015 Document Reviewed: 11/01/2014 °Elsevier Interactive Patient Education ©2016 [employer] ° °

## 2015-09-25 NOTE — ED Notes (Signed)
Pt refused to be changed to gown for x-ray---- states, "There are no metals on my tops, just buttons and I don't have a bra".  X-ray tech was made aware that pt has refused to be changed to hospital gown.

## 2015-09-25 NOTE — ED Notes (Addendum)
Brought in by EMS from home with c/o left rib cage pain after her fall.  Pt reports that she fell last Thursday and since then, she has been having pain to her left rib cage area.  Pt has two more falls since her fall last Thursday--- last fall aggravated left rib cage pain and now, she is also having lower back pain.  Pt on hospice care for brain cancer.  Was sent here for further evaluation and treatment of pain to left rib cage and lower back.

## 2015-09-25 NOTE — ED Notes (Signed)
Awake. Verbally responsive. A/O x4. Resp even and unlabored. No audible adventitious breath sounds noted. ABC's intact.  

## 2015-09-25 NOTE — ED Notes (Addendum)
INCENTIVE SPIROMETRY PERFORMED WITHOUT DIFFICULTY. Family present to understand and assist.

## 2015-09-25 NOTE — ED Provider Notes (Signed)
CSN: [id number]     Arrival date & time 09/25/15  0531 History   First MD Initiated Contact with Patient 09/25/15 [phone]     Chief Complaint  Patient presents with  . Fall  . 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 Patient reports that 4 days ago she tripped and fell landing on her left side. She notes immediate pain to the left anterior lateral rib cage. She reports pain with deep inspiration. Patient reports she has been taking her morphine 30 mg twice daily at home for cancer related pain, she reports this has been providing some relief but has not taken away the pain. Patient denies any shortness of breath, cough, abdominal pain. Patient notes that she had some wheezing this morning, used a breathing treatment which significantly improved her symptoms, but notes to have some wheezing here again in the ED. Patient denies any fever, chills, nausea, vomiting. Last dose of morphine was 6 hours ago.    Past Medical History  Diagnosis Date  . Asthma   . Ulcerative colitis   . Anxiety   . Depression   . Chronic back pain   . Chronic knee pain   . Thyroid disease     hypo  . GERD (gastroesophageal reflux disease)   . Insomnia   . Allergic rhinitis   . Arthritis   . COPD (chronic obstructive pulmonary disease) ([place])   . Mouth pain     jaw and mouth pain 06-13-15  . Cancer [facility])     Ovarian cancer 10'15- Dr. Curlene Dolphin follows. Chemo last 1'16, additional planned .  Marland Kitchen Chronic respiratory failure ([place]): 3L [place] 06/23/2015   Past Surgical History  Procedure Laterality Date  . Knee surgery      x3, two on left and one on right.  Arthoscopy  . Cholecystectomy    . Abdominal hysterectomy    . Cesarean section    . Tonsillectomy    . Joint replacement      2x on left, 1x on right  . Portacath placement Right     06-13-15 remains inplace  . Multiple extractions with alveoloplasty N/A 06/14/2015     Procedure: Extraction of tooth #'s 2, 4-14, 20-29 with alveoloplasty;  Surgeon: Lenn Cal, DDS;  Location: [facility];  Service: Oral Surgery;  Laterality: N/A;   Family History  Problem Relation Age of Onset  . High blood pressure Mother   . Bipolar disorder Mother   . Hypothyroidism Mother   . Rheum arthritis Mother   . Breast cancer Mother   . Ovarian cancer Mother   . Asthma Father   . Parkinson's disease Father   . CAD Father    Social History  Substance Use Topics  . Smoking status: Former Smoker -- 1.00 packs/day for 3 years    Types: Cigarettes    Quit date: 10/19/2011  . Smokeless tobacco: Current User  . Alcohol Use: No     Comment: rarely    OB History    No data available     Review of Systems  All other systems reviewed and are negative.   Allergies  Zofran; Penicillins; and Sulfamethoxazole-trimethoprim  Home Medications   Prior to Admission medications   Medication Sig Start Date End Date Taking? Authorizing Provider  albuterol (PROAIR HFA) 108 (90 BASE) MCG/ACT inhaler Inhale 2 puffs into the lungs every 4 (  four) hours as needed for wheezing or shortness of breath. 06/30/15   Nita Sells, MD  ALPRAZolam Duanne Moron) 0.5 MG tablet Take 1 tablet (0.5 mg total) by mouth 3 (three) times daily as needed for anxiety (For tremors). For tremors 09/22/15   Lennis Marion Downer, MD  benzonatate (TESSALON) 100 MG capsule Take 1 capsule (100 mg total) by mouth 3 (three) times daily as needed for cough. 08/19/15   Lennis Marion Downer, MD  buPROPion (WELLBUTRIN SR) 150 MG 12 hr tablet Take 150 mg by mouth 2 (two) times daily.  06/21/15   Historical Provider, MD  citalopram (CELEXA) 40 MG tablet Take 40 mg by mouth every morning.     Historical Provider, MD  dexamethasone (DECADRON) 4 MG tablet Take 1 tablet (4 mg total) by mouth 2 (two) times daily. For one week, then, 2 mg twice daily for one week, then, 2 mg daily. Patient not taking: Reported on 09/15/2015 07/21/15    Tyler Pita, MD  doxycycline (VIBRAMYCIN) 100 MG capsule Take 1 capsule (100 mg total) by mouth 2 (two) times daily. Patient not taking: Reported on 09/15/2015 09/01/15   Harvel Quale, MD  fluconazole (DIFLUCAN) 100 MG tablet Take 1 tablet (100 mg total) by mouth daily. Patient not taking: Reported on 09/15/2015 08/18/15   Tyler Pita, MD  Fluticasone-Salmeterol (ADVAIR) 500-50 MCG/DOSE AEPB Inhale 2 puffs into the lungs 2 (two) times daily.     Historical Provider, MD  furosemide (LASIX) 40 MG tablet Take 1 tablet (40 mg total) by mouth 2 (two) times daily. 09/23/15   Lennis Marion Downer, MD  ipratropium-albuterol (DUONEB) 0.5-2.5 (3) MG/3ML SOLN Take 3 mLs by nebulization every 6 (six) hours as needed (for wheezing. Take three times daily for  next 3 days). 02/28/14   Shanker Kristeen Mans, MD  levETIRAcetam (KEPPRA) 500 MG tablet Take 1 tablet (500 mg total) by mouth 2 (two) times daily. 09/23/15   Lennis Marion Downer, MD  levothyroxine (SYNTHROID, LEVOTHROID) 175 MCG tablet Take 1 tablet (175 mcg total) by mouth daily before breakfast. 09/23/15   Lennis Marion Downer, MD  lidocaine-prilocaine (EMLA) cream Apply 1 application topically daily as needed. Reported on 08/18/2015 07/07/14   Historical Provider, MD  montelukast (SINGULAIR) 10 MG tablet Take 10 mg by mouth daily.     Historical Provider, MD  morphine (MS CONTIN) 15 MG 12 hr tablet Take 1 tablet (15 mg total) by mouth every 12 (twelve) hours. 09/22/15   Lennis Marion Downer, MD  morphine (MSIR) 30 MG tablet Take 1 tablet (30 mg total) by mouth every 8 (eight) hours as needed for severe pain. 09/22/15   Lennis Marion Downer, MD  nystatin (MYCOSTATIN/NYSTOP) 100000 UNIT/GM POWD Apply 1 g topically 2 (two) times daily. To abdomen and groin 08/08/15   Lennis Marion Downer, MD  omeprazole (PRILOSEC) 40 MG capsule Take 40-80 mg by mouth 2 (two) times daily.     Historical Provider, MD  OXYGEN Inhale 3-4 L/min into the lungs every evening.     Historical Provider, MD   potassium chloride SA (K-DUR,KLOR-CON) 20 MEQ tablet Take 1 tablet (20 mEq total) by mouth daily. Patient not taking: Reported on 09/15/2015 08/07/15   Malvin Johns, MD  prochlorperazine (COMPAZINE) 10 MG tablet Take 1 tablet (10 mg total) by mouth every 6 (six) hours as needed for nausea or vomiting. 07/27/15   Lennis Marion Downer, MD  promethazine (PHENERGAN) 25 MG tablet Take 1 tablet (25 mg total) by mouth every 6 (six)  hours as needed for nausea or vomiting. 05/27/15   Lennis Marion Downer, MD  sorbitol 70 % SOLN Take 30 mLs by mouth daily as needed for moderate constipation. 06/30/15   Nita Sells, MD  traZODone (DESYREL) 100 MG tablet Take 100 mg by mouth at bedtime as needed for sleep.     Historical Provider, MD   BP 113/80 mmHg  Pulse 85  Temp(Src) 97.6 F (36.4 C) (Oral)  Resp 18  Wt 108.863 kg  SpO2 98%   Physical Exam  Constitutional: She is oriented to person, place, and time. She appears well-developed and well-nourished.  HENT:  Head: Normocephalic and atraumatic.  Eyes: Conjunctivae are normal. Pupils are equal, round, and reactive to light. Right eye exhibits no discharge. Left eye exhibits no discharge. No scleral icterus.  Neck: Normal range of motion. No JVD present. No tracheal deviation present.  Cardiovascular: Normal rate, regular rhythm, normal heart sounds and intact distal pulses.  Exam reveals no gallop and no friction rub.   No murmur heard. Pulmonary/Chest: Effort normal and breath sounds normal. No stridor. No respiratory distress. She has no wheezes. She has no rales. She exhibits tenderness.  Tenderness to palpation of the left anterior and lateral ribs, normal lung expansion  Abdominal: She exhibits no distension and no mass. There is no tenderness. There is no rebound and no guarding.  Musculoskeletal: Normal range of motion. She exhibits no edema.  Neurological: She is alert and oriented to person, place, and time. Coordination normal.  Skin: Skin is  warm and dry. No rash noted. No erythema. No pallor.  Psychiatric: She has a normal mood and affect. Her behavior is normal. Judgment and thought content normal.  Nursing note and vitals reviewed.   ED Course  Procedures (including critical care time) Labs Review Labs Reviewed - No data to display  Imaging Review Dg Ribs Unilateral W/chest Left  09/25/2015  CLINICAL DATA:  Fall with left anterior rib pain EXAM: LEFT RIBS AND CHEST - 3+ VIEW COMPARISON:  Chest x-ray 09/15/2015 FINDINGS: Right IJ porta catheter with tip in good position at the upper cavoatrial junction. Mild bilateral atelectasis. Stable borderline cardiomegaly. Mild widening of the lower mediastinum correlates with fat on 2016 chest CT. There is no edema, consolidation, effusion, or pneumothorax. Negative for rib fracture. Incidental bifid anterior left fifth rib. IMPRESSION: 1. Negative for acute fracture or pneumothorax. 2. Mild bilateral atelectasis. Electronically Signed   By: Monte Fantasia M.D.   On: 09/25/2015 06:41   I have personally reviewed and evaluated these images and lab results as part of my medical decision-making.   EKG Interpretation None      MDM   Final diagnoses:  Fall, initial encounter  Rib pain    Labs:  Imaging:dg ribs with chest- mild bilateral atelectasis   Consults:  Therapeutics: morphine   Discharge Meds: incentive spirometry   Assessment/Plan: 45 year old female presents today status post fall with rib cage pain. She has no acute findings on her chest x-ray, she was given a breathing treatment at her request. She had clear lung sounds, and pain was improved with above medications. Patient has a follow-up appointment with her oncologist who is managing her chronic pain tomorrow. Patient was given strict return precautions, verbalized understanding and agreement for today's plan and had no questions or concerns at the time of discharge.          Okey Regal,  PA-C 09/25/15 [id number]  April Palumbo, MD 09/25/15 2329

## 2015-09-25 NOTE — ED Notes (Signed)
Bed: WA10 Expected date:  Expected time:  Means of arrival:  Comments: ems 

## 2015-09-25 NOTE — ED Notes (Signed)
ED PA at bedside

## 2015-09-26 ENCOUNTER — Telehealth: Payer: Self-pay | Admitting: *Deleted

## 2015-09-26 NOTE — Telephone Encounter (Signed)
TC from Hospice[facility] RN regarding refills needed on pt's MScontin 15 mg, Morphine 30 mg short acting and xanax.  Relayed message to [name], RN with Dr. Marko Plume. [name] is aware of the refill request.

## 2015-09-26 NOTE — Telephone Encounter (Signed)
Spoke with Oneje and stated that the prescription were for a week supply and will not be filled prior to 09-29-15. Requested that she speak with Dr. Lyman Speller at team conference about methadone protocol per Dr. Marko Plume.   Oneje requested that Dr. Marko Plume speak directly to Dr. Lyman Speller regarding the  difficulty with the pain medication management. Gave information to Dr. Marko Plume.  She will call hospice to discuss with Dr. Lyman Speller.

## 2015-09-26 NOTE — Telephone Encounter (Signed)
Please see if Hospice would consider changing pain medication to methadone.  If not methadone, can refill MS Contin, MSIR and Xanax on 09-29-15 for one week supply.  Godfrey Pick, MD

## 2015-09-27 ENCOUNTER — Telehealth: Payer: Self-pay | Admitting: Oncology

## 2015-09-27 NOTE — Telephone Encounter (Signed)
Medical Oncology   left nonurgent message for Dr Karie Georges on his office VM, as he will be back at office on 09-28-15. Per Hospice staff, team meeting on 09-28-15. Requested consideration of methadone instead of present pain medications. Left my pager if he wants to discuss.   Godfrey Pick, MD

## 2015-10-07 ENCOUNTER — Telehealth: Payer: Self-pay

## 2015-10-07 NOTE — Telephone Encounter (Signed)
Debra [name] calling to update Dr. Marko Plume regarding pain management. Dr. Karie Georges and Garnett Farm visited Debra [name] at her home on 09-30-15 to discuss pain management.  Patient's MS Contin was increased from 15 mg q 12 hours to 60 mg q 12 hours. The MSIR remained at 30 mg every 4 hours as needed for breakthrough pain. Xanax was refilled as well. Xanax was refilled. Debra [name] stated that Dr. Karie Georges would like for Dr. Marko Plume to refill medications going forward. Requested Debra [name] fax Dr. Mariana Kaufman nurse the most recent medication list along with the information of LR, quantity given , and direction of medications. Fax: [phone]. Debra [name] stated that there was an incident recently when Debra [name] took took a lot of her Xanax at one time.  She will fax the incident report as well. Thisi nformation will be sent on Monday 10-10-15 when Debra [name] returns to the office.

## 2015-10-11 ENCOUNTER — Telehealth: Payer: Self-pay | Admitting: *Deleted

## 2015-10-11 NOTE — Telephone Encounter (Signed)
Spoke with Mother [name] and reiterated information shared by [employer] in note below. Validated mother's feelings of sadness at watching her daughter's health decline. Discussed [place] placement.  Mother and daughter could have more quality time with Catina as her condition declines and the physical care increases. Sharifah would be in an environment that would meet her physical needs and support her and the family during this difficult time. [name] stated that it would be good for someone else other than the family to discuss [employer] with [employer]. Hospice is to come out Friday 10-14-15 to see [place].  [name] feels it would be good to bring this up at that visit. Told [name] that this information  Would be relayed to Hospice Nurse Garnett Farm and Director of patient care Mariel Kansky, RN. Mother appreciated the call. Message left in Jan Parker's vm regarding above and spoke directly with [name],RN.  Garnett Farm will try to have the Hospice SSW go with her to Fridays appointment.

## 2015-10-11 NOTE — Telephone Encounter (Signed)
VM message received from patient's mother regarding ongoing and worsening bilateral leg edema. Return call to [place].  [name] states that Katarzyna is experiencing worsening swelling in her legs and thighs and now up into her abdomen.  [name] states they have tried ted hose, elevating legs, diuretics but nothing seems to help the edema.  [name] is trying to ask for some type of special compression stockings but is unclear as to what exactly she wants. Reviewed pt's medical history and attempted to discuss disease progression, increased tumor pressure on veins and and lymphatics causing slow fluid return from legs. [name] stated that she had her daughter get a pedicure and a leg massage and those did not help either.  Unclear as to how much pt's mother understands daughter's disease process. Recommended to [name] to have Hospice nurse and Hospice MD come out to discuss these things with her and daughter. Informed [name] that i would call hospice to let them know she has questions and concerns about daughter's current state and what to expect in the days ahead.  TC to Hospice in [place]. Spoke with Mariel Kansky, RN supervisor and updated her on this patient's concerns and concerns of the mother.  Jan states she will discuss with pt's nurse, Dr. Lyman Speller and the SW, Marilynne Halsted.

## 2015-10-14 ENCOUNTER — Telehealth: Payer: Self-pay

## 2015-10-14 MED ORDER — ALPRAZOLAM 0.5 MG PO TABS: 0.5000 mg | ORAL_TABLET | Freq: Three times a day (TID) | ORAL | Status: DC | PRN

## 2015-10-14 NOTE — Telephone Encounter (Signed)
[name] stated that Ms. Simonetti needed a refill on her Xanax sent to [facility]. Prescription called in for Xanax 0.5 mg 1 po tid prn anxiety or tremor #21 No RF. [name] lightly ace wrapped Ms. Buffalo' legs.  Dr. Marko Plume does not want to order Ted hose.

## 2015-10-17 ENCOUNTER — Telehealth: Payer: Self-pay

## 2015-10-17 ENCOUNTER — Telehealth: Payer: Self-pay | Admitting: Oncology

## 2015-10-17 NOTE — Telephone Encounter (Signed)
Dr. Marko Plume spoke with Mother this afternoon.

## 2015-10-17 NOTE — Telephone Encounter (Signed)
Mother Debra [name]  x2 this am. She wants to speak with Dr. Marko Plume directly,not her nurse or triage, about who is incharge of her medications.  There seems to be a discrepancy between Hospice physician and Dr. Marko Plume. She knows Dr. Marko Plume is seeing patients but needs to speak with her  Regarding this matter.

## 2015-10-17 NOTE — Telephone Encounter (Signed)
MEDICAL ONCOLOGY   Returned call to mother at her request.   DIscussed multiple symptoms (tremors, itching, dry skin, fluid retention, loss of hair, memory loss, bowels, HA, po intake, gradual decline).  Debra [name] is remarkably resilient in spite of all of the problems related to metastatic cancer.   I have explained that we are working as a team with the Hospice staff, including RN and Dr Karie Georges, and that we appreciate that Dr Karie Georges is able to see Debra [name] at home and his input for her medications. Prescriptions are written thru this office.   Mother expressed appreciation for call. She mentions that Dr [facility] cared for her husband, who  died with Hospice.   Godfrey Pick, MD

## 2015-10-20 ENCOUNTER — Other Ambulatory Visit: Payer: Self-pay | Admitting: *Deleted

## 2015-10-20 DIAGNOSIS — C563 Malignant neoplasm of bilateral ovaries: Secondary | ICD-10-CM

## 2015-10-20 DIAGNOSIS — C561 Malignant neoplasm of right ovary: Secondary | ICD-10-CM

## 2015-10-20 DIAGNOSIS — C7931 Secondary malignant neoplasm of brain: Secondary | ICD-10-CM

## 2015-10-20 DIAGNOSIS — F4323 Adjustment disorder with mixed anxiety and depressed mood: Secondary | ICD-10-CM

## 2015-10-20 DIAGNOSIS — C562 Malignant neoplasm of left ovary: Principal | ICD-10-CM

## 2015-10-20 MED ORDER — MORPHINE SULFATE 30 MG PO TABS: 30.0000 mg | ORAL_TABLET | Freq: Three times a day (TID) | ORAL | Status: DC | PRN

## 2015-10-20 MED ORDER — ALPRAZOLAM 0.5 MG PO TABS: 0.5000 mg | ORAL_TABLET | Freq: Three times a day (TID) | ORAL | Status: DC | PRN

## 2015-10-20 NOTE — Telephone Encounter (Signed)
Hospice nurse [name] left message that patient needs a refill on Xanax and MSIR 30 mg.   Will fax to [facility]

## 2015-10-25 ENCOUNTER — Telehealth: Payer: Self-pay

## 2015-10-25 NOTE — Telephone Encounter (Signed)
Mother Thomesha Rickner called stating that she has three tablets  left of the MSIR 30 mg for breakthrough pain.  #21 given on 10-20-15.  Mother did not look at the bottle and gave MSIR q 4 hrs instead of q 8 hrs as prescribed on 10-20-15.  Suanne Marker takes full responsibility for this.  She is requesting a refill for the MSIR~ 1 day early.

## 2015-10-25 NOTE — Telephone Encounter (Signed)
Spoke with Mother and discussed plan as noted below by Dr. Marko Plume.  Mother has Advil in the home and will use this. Will send a refill to the pharmacy on Thursday 10-27-15. Mother verbalized understanding.

## 2015-10-25 NOTE — Telephone Encounter (Signed)
Re med refill early  I will refill on 10-27-15 when I am back in office, enough for a week as we have been doing,  due to concerns about narcotics at home. Suggest trying Aleve or OTC ibuprofen with food bid to stretch out the MSIR left.  Could also split the 30 mg MSIR tablets to use 15 mg per dose for now.  Thank you

## 2015-10-26 ENCOUNTER — Other Ambulatory Visit: Payer: Self-pay | Admitting: Oncology

## 2015-10-26 NOTE — Telephone Encounter (Signed)
Refill request came in today 10-26-15 for Xanax 0.5 mg tabs. Take 1 po tid prn anxiety (For tremors) # 21. (LF 10-20-15) This can be refilled on 10-27-15 along with MSIR 30 mg tabs 1 tab po q 8 hrs prn for severe pain. # 21 (LF 10-20-15)

## 2015-10-27 ENCOUNTER — Other Ambulatory Visit: Payer: Self-pay

## 2015-10-27 DIAGNOSIS — C562 Malignant neoplasm of left ovary: Principal | ICD-10-CM

## 2015-10-27 DIAGNOSIS — C561 Malignant neoplasm of right ovary: Secondary | ICD-10-CM

## 2015-10-27 DIAGNOSIS — C563 Malignant neoplasm of bilateral ovaries: Secondary | ICD-10-CM

## 2015-10-27 DIAGNOSIS — F4323 Adjustment disorder with mixed anxiety and depressed mood: Secondary | ICD-10-CM

## 2015-10-27 DIAGNOSIS — C7931 Secondary malignant neoplasm of brain: Secondary | ICD-10-CM

## 2015-10-27 MED ORDER — MORPHINE SULFATE 30 MG PO TABS: 30.0000 mg | ORAL_TABLET | Freq: Three times a day (TID) | ORAL | Status: DC | PRN

## 2015-10-27 MED ORDER — ALPRAZOLAM 0.5 MG PO TABS: 0.5000 mg | ORAL_TABLET | Freq: Three times a day (TID) | ORAL | Status: DC | PRN

## 2015-10-27 NOTE — Telephone Encounter (Signed)
S/w rhonda that rx was signed and being faxed.

## 2015-11-02 ENCOUNTER — Other Ambulatory Visit: Payer: Self-pay | Admitting: Oncology

## 2015-11-02 DIAGNOSIS — C561 Malignant neoplasm of right ovary: Secondary | ICD-10-CM

## 2015-11-02 DIAGNOSIS — C562 Malignant neoplasm of left ovary: Principal | ICD-10-CM

## 2015-11-02 DIAGNOSIS — C563 Malignant neoplasm of bilateral ovaries: Secondary | ICD-10-CM

## 2015-11-03 ENCOUNTER — Telehealth: Payer: Self-pay

## 2015-11-03 ENCOUNTER — Other Ambulatory Visit: Payer: Self-pay

## 2015-11-03 MED ORDER — ALPRAZOLAM 0.5 MG PO TABS: ORAL_TABLET | ORAL | Status: DC

## 2015-11-03 MED ORDER — MORPHINE SULFATE 30 MG PO TABS: 30.0000 mg | ORAL_TABLET | Freq: Three times a day (TID) | ORAL | Status: DC | PRN

## 2015-11-03 MED ORDER — MORPHINE SULFATE ER 60 MG PO TBCR: 60.0000 mg | EXTENDED_RELEASE_TABLET | Freq: Two times a day (BID) | ORAL | Status: DC

## 2015-11-03 NOTE — Telephone Encounter (Signed)
Spoke with mother Debra [name] and told her that the prescriptions for xanax, MS Contin, and MSIR 30 mg  were sent to [facility] this morning and the next refill will be on Thursday 11-10-15. Mother feeling that the refills are close and that she may run out of medication. MSIR 30 mg tabs.  She had 4 tabs left on 10-26-15 MScontin 60 mg tabs. 2 left on 10-26-15 Xanax 0.5 mg tabs 2 left on 10-26-15. Tried to explain that the prescriptions are for 1 week at a time.  If her daughter Debra [name] is requiring more medication then prescribed, she needs to call the hospice nurse to come and evaluate her condition. Debra [name] verbalized understanding.

## 2015-11-04 ENCOUNTER — Telehealth: Payer: Self-pay

## 2015-11-04 NOTE — Telephone Encounter (Signed)
[name] stated that she attempted to flush [name]  PAC.  She feels that the [facility] is turned.   Does [name] need to come to the [facility] to have it flushed/evaluated or is it ok to leave un flushed? Hospice can arrage for transportation if necessary.

## 2015-11-04 NOTE — Telephone Encounter (Signed)
Re PAC  Would just leave it without flushing if seems to be flipped over

## 2015-11-07 ENCOUNTER — Telehealth: Payer: Self-pay

## 2015-11-07 DIAGNOSIS — J9611 Chronic respiratory failure with hypoxia: Secondary | ICD-10-CM

## 2015-11-07 DIAGNOSIS — C561 Malignant neoplasm of right ovary: Secondary | ICD-10-CM

## 2015-11-07 DIAGNOSIS — E079 Disorder of thyroid, unspecified: Secondary | ICD-10-CM

## 2015-11-07 DIAGNOSIS — C563 Malignant neoplasm of bilateral ovaries: Secondary | ICD-10-CM

## 2015-11-07 DIAGNOSIS — C562 Malignant neoplasm of left ovary: Secondary | ICD-10-CM

## 2015-11-07 MED ORDER — FUROSEMIDE 40 MG PO TABS: 40.0000 mg | ORAL_TABLET | Freq: Two times a day (BID) | ORAL | Status: AC

## 2015-11-07 NOTE — Telephone Encounter (Signed)
LM in [name]'s voice mail  Regarding Dr. Mariana Kaufman response to the [facility] as noted below.

## 2015-11-07 NOTE — Telephone Encounter (Signed)
Judeen Hammans called stating that Ms. Azer needs a refill on her xanax 0.5 mg tabs.  She has 8 left.   Prescription were  sent to her pharmacy on 11-02-15~1342 by the Hospice physician on call  for #30 xanax 0.5 mg tabs.  She is taking 3-4 a day. MS Contin 60 mg 1 po q 12 hours #60. MSIR 30 mg # 30. [employer] and  Spoke with a Merchant navy officer.  The prescriptions were picked up 11-03-15.  Inquired about the status of the prescriptions faxed in on 11-03-15 for the same medications from Dr. Marko Plume. The pharmacist put them aside to be filled at a later date since pt just picked up the medications. Requested that the prescriptions from Dr. Marko Plume be destroyed.  Ms. Macneal is set up to be notified when [facility] are [facility] Dr. Marko Plume notified of medication refills.

## 2015-11-08 ENCOUNTER — Telehealth: Payer: Self-pay | Admitting: Oncology

## 2015-11-08 NOTE — Telephone Encounter (Signed)
Medical Oncology  LM for Hospice RN case manager to call me back on office cell re concerns about narcotics prescriptions being prescribed both by Hospice providers and by this physician.  L.Daliah Chaudoin

## 2015-11-08 NOTE — Telephone Encounter (Signed)
MEDICAL ONCOLOGY  Hospice case manager returned my call, discussed concerns about both Hospice MD and oncologist prescribing narcotics and xanax. I believe it is best for a single provider to do all prescriptions for this patient.   Hospice team to discuss and Abigail Butts will be back in touch with me on 11-09-15.   Patient does not need any prescriptions today- see information from pharmacy in RN phone note 11-07-15.   Godfrey Pick, MD

## 2015-11-09 ENCOUNTER — Telehealth: Payer: Self-pay | Admitting: Oncology

## 2015-11-09 NOTE — Telephone Encounter (Signed)
Medical Oncology  Phone call back to this MD from Hospice, requesting that all prescriptions for morphine (MS Contin and immediate release) and xanax be done by this physician.  Hospice aware that we will do prescriptions weekly for amount required for one week.   Godfrey Pick, MD

## 2015-11-11 ENCOUNTER — Telehealth: Payer: Self-pay

## 2015-11-11 DIAGNOSIS — C563 Malignant neoplasm of bilateral ovaries: Secondary | ICD-10-CM

## 2015-11-11 DIAGNOSIS — C562 Malignant neoplasm of left ovary: Principal | ICD-10-CM

## 2015-11-11 DIAGNOSIS — C561 Malignant neoplasm of right ovary: Secondary | ICD-10-CM

## 2015-11-11 MED ORDER — ALPRAZOLAM 0.5 MG PO TABS: ORAL_TABLET | ORAL | Status: DC

## 2015-11-11 MED ORDER — MORPHINE SULFATE 30 MG PO TABS: 30.0000 mg | ORAL_TABLET | Freq: Three times a day (TID) | ORAL | Status: DC | PRN

## 2015-11-11 NOTE — Telephone Encounter (Signed)
[name] from hospice called for pain medication and xanax refill. The pt has no xanax left. She has 8 msir tablets left. She has plenty of MS contin.  When looking at [facility] phone call message from 11/07/15, if the pt is taking these medications as prescribed she should be out of xanax and MSIR on 3/25. She should be out of MS contin on 12/02/15.  Dr Jana Hakim is oncall today for Dr Edwyna Shell. Prescriptions for 1 week worth of xanax and MSIR prepared for signature.

## 2015-11-13 ENCOUNTER — Encounter (HOSPITAL_COMMUNITY): Payer: Self-pay | Admitting: Emergency Medicine

## 2015-11-13 ENCOUNTER — Emergency Department (HOSPITAL_COMMUNITY)
Admission: EM | Admit: 2015-11-13 | Discharge: 2015-11-13 | Disposition: A | Discharge status: Home / Self Care | Payer: Medicaid Other | Attending: Emergency Medicine | Admitting: Emergency Medicine

## 2015-11-13 ENCOUNTER — Emergency Department (HOSPITAL_COMMUNITY): Payer: Medicaid Other

## 2015-11-13 DIAGNOSIS — G8929 Other chronic pain: Secondary | ICD-10-CM | POA: Insufficient documentation

## 2015-11-13 DIAGNOSIS — J449 Chronic obstructive pulmonary disease, unspecified: Secondary | ICD-10-CM | POA: Diagnosis not present

## 2015-11-13 DIAGNOSIS — S8002XA Contusion of left knee, initial encounter: Secondary | ICD-10-CM | POA: Diagnosis not present

## 2015-11-13 DIAGNOSIS — K219 Gastro-esophageal reflux disease without esophagitis: Secondary | ICD-10-CM | POA: Diagnosis not present

## 2015-11-13 DIAGNOSIS — E039 Hypothyroidism, unspecified: Secondary | ICD-10-CM | POA: Insufficient documentation

## 2015-11-13 DIAGNOSIS — Z8739 Personal history of other diseases of the musculoskeletal system and connective tissue: Secondary | ICD-10-CM | POA: Diagnosis not present

## 2015-11-13 DIAGNOSIS — W010XXA Fall on same level from slipping, tripping and stumbling without subsequent striking against object, initial encounter: Secondary | ICD-10-CM | POA: Diagnosis not present

## 2015-11-13 DIAGNOSIS — F329 Major depressive disorder, single episode, unspecified: Secondary | ICD-10-CM | POA: Insufficient documentation

## 2015-11-13 DIAGNOSIS — F419 Anxiety disorder, unspecified: Secondary | ICD-10-CM | POA: Insufficient documentation

## 2015-11-13 DIAGNOSIS — Y998 Other external cause status: Secondary | ICD-10-CM | POA: Diagnosis not present

## 2015-11-13 DIAGNOSIS — Z7951 Long term (current) use of inhaled steroids: Secondary | ICD-10-CM | POA: Insufficient documentation

## 2015-11-13 DIAGNOSIS — Z87891 Personal history of nicotine dependence: Secondary | ICD-10-CM | POA: Insufficient documentation

## 2015-11-13 DIAGNOSIS — Z79899 Other long term (current) drug therapy: Secondary | ICD-10-CM | POA: Diagnosis not present

## 2015-11-13 DIAGNOSIS — Y9389 Activity, other specified: Secondary | ICD-10-CM | POA: Insufficient documentation

## 2015-11-13 DIAGNOSIS — R531 Weakness: Secondary | ICD-10-CM | POA: Diagnosis present

## 2015-11-13 DIAGNOSIS — Z8543 Personal history of malignant neoplasm of ovary: Secondary | ICD-10-CM | POA: Diagnosis not present

## 2015-11-13 DIAGNOSIS — Y9289 Other specified places as the place of occurrence of the external cause: Secondary | ICD-10-CM | POA: Diagnosis not present

## 2015-11-13 DIAGNOSIS — Z880 Allergy status to penicillin: Secondary | ICD-10-CM | POA: Diagnosis not present

## 2015-11-13 MED ORDER — OXYCODONE-ACETAMINOPHEN 5-325 MG PO TABS
2.0000 | ORAL_TABLET | Freq: Once | ORAL | Status: AC
  Administered 2015-11-13: 2 via ORAL
  Filled 2015-11-13: qty 2

## 2015-11-13 NOTE — ED Provider Notes (Signed)
CSN: [id number]     Arrival date & time 11/13/15  1300 History   First MD Initiated Contact with Patient 11/13/15 1332     Chief Complaint  Patient presents with  . Weakness     (Consider location/radiation/quality/duration/timing/severity/associated sxs/prior Treatment) Patient is a 45 y.o. female presenting with weakness. The history is provided by the patient and the EMS personnel.  Weakness Pertinent negatives include no headaches and no shortness of breath.  Patient w hx metastatic ovarian ca/brain mets, hospice care, presents indicating she stumbled at home, falling forward onto left knee. Contusion and pain to left knee. Moderate, constant, worse w palpation. Denies other pain or injury. No faintness or dizziness prior to fall. Head injury or headache. No neck or back pain. No numbness/weakness.  Pt indicates her recent health otherwise c/w her baseline.       Past Medical History  Diagnosis Date  . Asthma   . Ulcerative colitis   . Anxiety   . Depression   . Chronic back pain   . Chronic knee pain   . Thyroid disease     hypo  . GERD (gastroesophageal reflux disease)   . Insomnia   . Allergic rhinitis   . Arthritis   . COPD (chronic obstructive pulmonary disease) ([place])   . Mouth pain     jaw and mouth pain 06-13-15  . Cancer [facility])     Ovarian cancer 10'15- Dr. Curlene Dolphin follows. Chemo last 1'16, additional planned .  Marland Kitchen Chronic respiratory failure ([place]): 3L [place] 06/23/2015   Past Surgical History  Procedure Laterality Date  . Knee surgery      x3, two on left and one on right.  Arthoscopy  . Cholecystectomy    . Abdominal hysterectomy    . Cesarean section    . Tonsillectomy    . Joint replacement      2x on left, 1x on right  . Portacath placement Right     06-13-15 remains inplace  . Multiple extractions with alveoloplasty N/A 06/14/2015    Procedure: Extraction of tooth #'s 2, 4-14, 20-29 with alveoloplasty;  Surgeon: Lenn Cal, DDS;  Location: [facility]  ORS;  Service: Oral Surgery;  Laterality: N/A;   Family History  Problem Relation Age of Onset  . High blood pressure Mother   . Bipolar disorder Mother   . Hypothyroidism Mother   . Rheum arthritis Mother   . Breast cancer Mother   . Ovarian cancer Mother   . Asthma Father   . Parkinson's disease Father   . CAD Father    Social History  Substance Use Topics  . Smoking status: Former Smoker -- 1.00 packs/day for 3 years    Types: Cigarettes    Quit date: 10/19/2011  . Smokeless tobacco: Current User  . Alcohol Use: No     Comment: rarely    OB History    No data available     Review of Systems  Constitutional: Negative for fever and chills.  Respiratory: Negative for shortness of breath.   Gastrointestinal: Negative for nausea and vomiting.  Musculoskeletal: Negative for back pain and neck pain.  Skin: Negative for wound.  Neurological: Negative for weakness, numbness and headaches.      Allergies  Zofran; Penicillins; and Sulfamethoxazole-trimethoprim  Home Medications   Prior to Admission medications   Medication Sig Start Date End Date Taking? Authorizing Provider  albuterol (PROAIR HFA) 108 (90 BASE) MCG/ACT inhaler Inhale 2 puffs into the lungs every 4 (four)  hours as needed for wheezing or shortness of breath. 06/30/15   Nita Sells, MD  ALPRAZolam Duanne Moron) 0.5 MG tablet take 1 tablet by mouth three times a day if needed for anxiety (FOR TREMORES) 11/11/15   Chauncey Cruel, MD  benzonatate (TESSALON) 100 MG capsule Take 1 capsule (100 mg total) by mouth 3 (three) times daily as needed for cough. 08/19/15   Lennis Marion Downer, MD  buPROPion (WELLBUTRIN SR) 150 MG 12 hr tablet Take 150 mg by mouth 2 (two) times daily.  06/21/15   Historical Provider, MD  citalopram (CELEXA) 40 MG tablet Take 40 mg by mouth every morning.     Historical Provider, MD  dexamethasone (DECADRON) 4 MG tablet Take 1 tablet (4 mg total) by mouth 2 (two) times daily. For one week,  then, 2 mg twice daily for one week, then, 2 mg daily. Patient not taking: Reported on 09/15/2015 07/21/15   Tyler Pita, MD  doxycycline (VIBRAMYCIN) 100 MG capsule Take 1 capsule (100 mg total) by mouth 2 (two) times daily. Patient not taking: Reported on 09/15/2015 09/01/15   Harvel Quale, MD  fluconazole (DIFLUCAN) 100 MG tablet Take 1 tablet (100 mg total) by mouth daily. Patient not taking: Reported on 09/15/2015 08/18/15   Tyler Pita, MD  Fluticasone-Salmeterol (ADVAIR) 500-50 MCG/DOSE AEPB Inhale 2 puffs into the lungs 2 (two) times daily.     Historical Provider, MD  furosemide (LASIX) 40 MG tablet Take 1 tablet (40 mg total) by mouth 2 (two) times daily. 11/07/15   Lennis Marion Downer, MD  ipratropium-albuterol (DUONEB) 0.5-2.5 (3) MG/3ML SOLN Take 3 mLs by nebulization every 6 (six) hours as needed (for wheezing. Take three times daily for  next 3 days). 02/28/14   Shanker Kristeen Mans, MD  levETIRAcetam (KEPPRA) 500 MG tablet Take 1 tablet (500 mg total) by mouth 2 (two) times daily. 09/23/15   Lennis Marion Downer, MD  levothyroxine (SYNTHROID, LEVOTHROID) 175 MCG tablet Take 1 tablet (175 mcg total) by mouth daily before breakfast. 09/23/15   Lennis Marion Downer, MD  lidocaine-prilocaine (EMLA) cream Apply 1 application topically daily as needed. Reported on 08/18/2015 07/07/14   Historical Provider, MD  montelukast (SINGULAIR) 10 MG tablet Take 10 mg by mouth daily.     Historical Provider, MD  morphine (MS CONTIN) 60 MG 12 hr tablet Take 1 tablet (60 mg total) by mouth every 12 (twelve) hours. 11/03/15   Lennis Marion Downer, MD  morphine (MSIR) 30 MG tablet Take 1 tablet (30 mg total) by mouth every 8 (eight) hours as needed for severe pain. 11/11/15   Chauncey Cruel, MD  nystatin (MYCOSTATIN/NYSTOP) 100000 UNIT/GM POWD Apply 1 g topically 2 (two) times daily. To abdomen and groin 08/08/15   Lennis Marion Downer, MD  omeprazole (PRILOSEC) 40 MG capsule Take 40-80 mg by mouth 2 (two) times daily.      Historical Provider, MD  OXYGEN Inhale 3-4 L/min into the lungs every evening.     Historical Provider, MD  potassium chloride SA (K-DUR,KLOR-CON) 20 MEQ tablet Take 1 tablet (20 mEq total) by mouth daily. Patient not taking: Reported on 09/15/2015 08/07/15   Malvin Johns, MD  prochlorperazine (COMPAZINE) 10 MG tablet Take 1 tablet (10 mg total) by mouth every 6 (six) hours as needed for nausea or vomiting. 07/27/15   Lennis Marion Downer, MD  promethazine (PHENERGAN) 25 MG tablet Take 1 tablet (25 mg total) by mouth every 6 (six) hours as needed for nausea or  vomiting. 05/27/15   Lennis Marion Downer, MD  sorbitol 70 % SOLN Take 30 mLs by mouth daily as needed for moderate constipation. 06/30/15   Nita Sells, MD  traZODone (DESYREL) 100 MG tablet Take 100 mg by mouth at bedtime as needed for sleep.     Historical Provider, MD   BP 102/74 mmHg  Pulse 86  Temp(Src) 97.9 F (36.6 C) (Oral)  Resp 18  SpO2 92% Physical Exam  Constitutional: She is oriented to person, place, and time. She appears well-developed and well-nourished. No distress.  HENT:  Head: Atraumatic.  Eyes: Conjunctivae are normal. Pupils are equal, round, and reactive to light. No scleral icterus.  Neck: Normal range of motion. Neck supple. No tracheal deviation present.  Cardiovascular: Normal rate, normal heart sounds and intact distal pulses.   Pulmonary/Chest: Effort normal and breath sounds normal. No respiratory distress. She exhibits no tenderness.  Abdominal: Soft. Normal appearance. She exhibits no distension. There is no tenderness.  obese  Musculoskeletal: She exhibits no edema.  Tenderness left knee anteriorly. No effusion. Knee grossly stable. Distal pulses palp. No other focal bony tenderness on ext exam.  CTLS spine, non tender, aligned, no step off.   Neurological: She is alert and oriented to person, place, and time.  Skin: Skin is warm and dry. No rash noted.  Skin intact.   Psychiatric: She has a  normal mood and affect.  Nursing note and vitals reviewed.   ED Course  Procedures (including critical care time)  Imaging Review Dg Knee Complete 4 Views Left  11/13/2015  CLINICAL DATA:  Fall, left knee pain EXAM: LEFT KNEE - COMPLETE 4+ VIEW COMPARISON:  None. FINDINGS: No fracture or dislocation is seen. Moderate degenerative changes, most prominent in the medial and lateral compartments. Visualized soft tissues are within normal limits. No definite suprapatellar knee joint effusion. IMPRESSION: No fracture or dislocation is seen. Moderate degenerative changes. Electronically Signed   By: Julian Hy M.D.   On: 11/13/2015 14:14   I have personally reviewed and evaluated these images as part of my medical decision-making.    MDM   Xrays.  Percocet po for pain.  Pt requests po fluids/SPrite - provided to her, tolerates well.    Pt indicates recent health otherwise c/w baseline.  Pt current appears stable for d/c to home, pt indicates is currently with hospice care, has home hospice nurse 3x/week.      Lajean Saver, MD 11/13/15 1435

## 2015-11-13 NOTE — Discharge Instructions (Signed)
It was our pleasure to provide your ER care today - we hope that you feel better.  Rest. Ice/coldpack to sore area.   Take your pain medication as need.  Follow up with your hospice care providers.  Return to ER if worse, new symptoms, fevers, trouble breathing, medical emergency, other concern.     Contusion A contusion is a deep bruise. Contusions are the result of a blunt injury to tissues and muscle fibers under the skin. The injury causes bleeding under the skin. The skin overlying the contusion may turn blue, purple, or yellow. Minor injuries will give you a painless contusion, but more severe contusions may stay painful and swollen for a few weeks.  CAUSES  This condition is usually caused by a blow, trauma, or direct force to an area of the body. SYMPTOMS  Symptoms of this condition include:  Swelling of the injured area.  Pain and tenderness in the injured area.  Discoloration. The area may have redness and then turn blue, purple, or yellow. DIAGNOSIS  This condition is diagnosed based on a physical exam and medical history. An X-ray, CT scan, or MRI may be needed to determine if there are any associated injuries, such as broken bones (fractures). TREATMENT  Specific treatment for this condition depends on what area of the body was injured. In general, the best treatment for a contusion is resting, icing, applying pressure to (compression), and elevating the injured area. This is often called the RICE strategy. Over-the-counter anti-inflammatory medicines may also be recommended for pain control.  HOME CARE INSTRUCTIONS   Rest the injured area.  If directed, apply ice to the injured area:  Put ice in a plastic bag.  Place a towel between your skin and the bag.  Leave the ice on for 20 minutes, 2-3 times per day.  If directed, apply light compression to the injured area using an elastic bandage. Make sure the bandage is not wrapped too tightly. Remove and reapply the  bandage as directed by your health care provider.  If possible, raise (elevate) the injured area above the level of your heart while you are sitting or lying down.  Take over-the-counter and prescription medicines only as told by your health care provider. SEEK MEDICAL CARE IF:  Your symptoms do not improve after several days of treatment.  Your symptoms get worse.  You have difficulty moving the injured area. SEEK IMMEDIATE MEDICAL CARE IF:   You have severe pain.  You have numbness in a hand or foot.  Your hand or foot turns pale or cold.   This information is not intended to replace advice given to you by your health care provider. Make sure you discuss any questions you have with your health care provider.   Document Released: 05/16/2005 Document Revised: 04/27/2015 Document Reviewed: 12/22/2014 Elsevier Interactive Patient Education 2016 Elsevier Inc.      Cryotherapy Cryotherapy is when you put ice on your injury. Ice helps lessen pain and puffiness (swelling) after an injury. Ice works the best when you start using it in the first 24 to 48 hours after an injury. HOME CARE  Put a dry or damp towel between the ice pack and your skin.  You may press gently on the ice pack.  Leave the ice on for no more than 10 to 20 minutes at a time.  Check your skin after 5 minutes to make sure your skin is okay.  Rest at least 20 minutes between ice pack uses.  Stop using ice when your skin loses feeling (numbness).  Do not use ice on someone who cannot tell you when it hurts. This includes small children and people with memory problems (dementia). GET HELP RIGHT AWAY IF:  You have white spots on your skin.  Your skin turns blue or pale.  Your skin feels waxy or hard.  Your puffiness gets worse. MAKE SURE YOU:   Understand these instructions.  Will watch your condition.  Will get help right away if you are not doing well or get worse.   This information is not  intended to replace advice given to you by your health care provider. Make sure you discuss any questions you have with your health care provider.   Document Released: 01/23/2008 Document Revised: 10/29/2011 Document Reviewed: 03/29/2011 Elsevier Interactive Patient Education 2016 Elsevier Inc.    Knee Pain Knee pain is a common problem. It can have many causes. The pain often goes away by following your doctor's home care instructions. Treatment for ongoing pain will depend on the cause of your pain. If your knee pain continues, more tests may be needed to diagnose your condition. Tests may include X-rays or other imaging studies of your knee. HOME CARE  Take medicines only as told by your doctor.  Rest your knee and keep it raised (elevated) while you are resting.  Do not do things that cause pain or make your pain worse.  Avoid activities where both feet leave the ground at the same time, such as running, jumping rope, or doing jumping jacks.  Apply ice to the knee area:  Put ice in a plastic bag.  Place a towel between your skin and the bag.  Leave the ice on for 20 minutes, 2-3 times a day.  Ask your doctor if you should wear an elastic knee support.  Sleep with a pillow under your knee.  Lose weight if you are overweight. Being overweight can make your knee hurt more.  Do not use any tobacco products, including cigarettes, chewing tobacco, or electronic cigarettes. If you need help quitting, ask your doctor. Smoking may slow the healing of any bone and joint problems that you may have. GET HELP IF:  Your knee pain does not stop, it changes, or it gets worse.  You have a fever along with knee pain.  Your knee gives out or locks up.  Your knee becomes more swollen. GET HELP RIGHT AWAY IF:   Your knee feels hot to the touch.  You have chest pain or trouble breathing.   This information is not intended to replace advice given to you by your health care provider.  Make sure you discuss any questions you have with your health care provider.   Document Released: 11/02/2008 Document Revised: 08/27/2014 Document Reviewed: 10/07/2013 Elsevier Interactive Patient Education [employer].

## 2015-11-13 NOTE — ED Notes (Signed)
Awake. Verbally responsive. A/O x4. Resp even and unlabored. No audible adventitious breath sounds noted. ABC's intact.  

## 2015-11-13 NOTE — ED Notes (Signed)
Pt from home via EMS- Per EMS, family reports that pt has had frequent falls recently with no injury. Pt family reports no LOC or hitting head. Pt has hx of brain and ovarian CA and is on hospice care at this time. Pt last chemo tx was Oct 2016. Pt is A&O x4 and in NAD.

## 2015-11-15 ENCOUNTER — Telehealth: Payer: Self-pay

## 2015-11-15 MED ORDER — HYDROXYZINE HCL 10 MG PO TABS: 10.0000 mg | ORAL_TABLET | ORAL | Status: AC | PRN

## 2015-11-15 NOTE — Telephone Encounter (Signed)
S/w ronda and Dr Jenene Slicker has ordered the atarax. She was letting us know.

## 2015-11-15 NOTE — Telephone Encounter (Signed)
-----   Message from Gordy Levan, MD sent at 11/15/2015  2:06 PM EDT ----- Fine to do script for atarax if that is one she has used   Also need to be sure we know when next scripts for pain meds are really due, counting from the last large amount given by Hospice. We will do one week at a time when due from there  thanks

## 2015-11-15 NOTE — Telephone Encounter (Signed)
Mother called for refill on atarax for itching. 1116 lvm for [place], we do not have atarax on the pt's medication list. Can she tell us who the original prescriber was? Hospice?

## 2015-11-18 ENCOUNTER — Other Ambulatory Visit: Payer: Self-pay

## 2015-11-18 DIAGNOSIS — C561 Malignant neoplasm of right ovary: Secondary | ICD-10-CM

## 2015-11-18 DIAGNOSIS — C563 Malignant neoplasm of bilateral ovaries: Secondary | ICD-10-CM

## 2015-11-18 DIAGNOSIS — C562 Malignant neoplasm of left ovary: Principal | ICD-10-CM

## 2015-11-18 MED ORDER — ALPRAZOLAM 0.5 MG PO TABS: ORAL_TABLET | ORAL | Status: DC

## 2015-11-18 NOTE — Telephone Encounter (Signed)
Notified Ms. Debra [name] that her Xanax prescription was sent to [facility]. [name] stated that her mother just went to the pharmacy to pick up the medication.

## 2015-11-18 NOTE — Telephone Encounter (Signed)
Xanax refilled last on 11-11-15 for 1 week supply. Will fax in another prescription to [facility] aide  No refills needed on MSIR tablets per mother.(Received #30 11-03-15 by [facility])  MS Contin 60 mg #60 on 11-03-15 per [facility]  Dr. Marko Plume requesting refills to be on Thursdays while she is in the office so she can sign the prescriptions. Notation made in Patient coordination notes on face page of chart.

## 2015-11-21 ENCOUNTER — Other Ambulatory Visit: Payer: Self-pay

## 2015-11-21 DIAGNOSIS — C563 Malignant neoplasm of bilateral ovaries: Secondary | ICD-10-CM

## 2015-11-21 DIAGNOSIS — C562 Malignant neoplasm of left ovary: Principal | ICD-10-CM

## 2015-11-21 DIAGNOSIS — C561 Malignant neoplasm of right ovary: Secondary | ICD-10-CM

## 2015-11-21 MED ORDER — MORPHINE SULFATE 30 MG PO TABS: 30.0000 mg | ORAL_TABLET | Freq: Three times a day (TID) | ORAL | Status: DC | PRN

## 2015-11-21 NOTE — Telephone Encounter (Signed)
Requesting ms ir refill. Last filled 3/24 for 21 tablets. Pt has 5 left at present.

## 2015-11-21 NOTE — Telephone Encounter (Signed)
S/w ronda rx ready for pickup

## 2015-11-22 ENCOUNTER — Telehealth: Payer: Self-pay | Admitting: *Deleted

## 2015-11-22 MED ORDER — CITALOPRAM HYDROBROMIDE 40 MG PO TABS: 40.0000 mg | ORAL_TABLET | Freq: Every morning | ORAL | Status: AC

## 2015-11-22 NOTE — Telephone Encounter (Signed)
S/w hospice manager. So long as we are not having issues with early refills on medications other than morphine and xanax we call refill for 1 month. Otherwise we can refill for shorter time periods. Will refill citalopram for 4 weeks per Dr Mariana Kaufman attached message.   lvm that citalopram was e-scribed.

## 2015-11-22 NOTE — Telephone Encounter (Signed)
Re citalopram  If she has been taking this regularly, ok to refill, x 2wks or 4 wks, which every Hospice prefers if they are covering this medication (may not be covered by Hospice?)  Thank you

## 2015-11-22 NOTE — Telephone Encounter (Signed)
"  This is Tanairi Geno calling for my daughter Debra [name].  She is a hospice patient.  Has two pills left of the  Morphine IR 30 mg.   She also needs Citalopram refilled.  This was originally Dr. Sheliah Plane but she is now under Hospice" Informed Mom the Morphine was taken care of yesterday will notify staff about the citalopram request.

## 2015-11-25 ENCOUNTER — Telehealth: Payer: Self-pay

## 2015-11-25 DIAGNOSIS — C561 Malignant neoplasm of right ovary: Secondary | ICD-10-CM

## 2015-11-25 DIAGNOSIS — C562 Malignant neoplasm of left ovary: Principal | ICD-10-CM

## 2015-11-25 DIAGNOSIS — C563 Malignant neoplasm of bilateral ovaries: Secondary | ICD-10-CM

## 2015-11-25 MED ORDER — ALPRAZOLAM 0.5 MG PO TABS: ORAL_TABLET | ORAL | Status: DC

## 2015-11-25 NOTE — Telephone Encounter (Signed)
Debra [name]' mother Debra [name]  called stating that [name] needs a refill on her xanax.  She is completely out of medication.  Will refill today for 1 week supply. Prescription faxed to [facility].

## 2015-11-28 ENCOUNTER — Telehealth: Payer: Self-pay

## 2015-11-28 ENCOUNTER — Other Ambulatory Visit: Payer: Self-pay | Admitting: Oncology

## 2015-11-28 DIAGNOSIS — C562 Malignant neoplasm of left ovary: Principal | ICD-10-CM

## 2015-11-28 DIAGNOSIS — C563 Malignant neoplasm of bilateral ovaries: Secondary | ICD-10-CM

## 2015-11-28 DIAGNOSIS — E079 Disorder of thyroid, unspecified: Secondary | ICD-10-CM

## 2015-11-28 DIAGNOSIS — C561 Malignant neoplasm of right ovary: Secondary | ICD-10-CM

## 2015-11-28 MED ORDER — MORPHINE SULFATE 30 MG PO TABS: 30.0000 mg | ORAL_TABLET | Freq: Three times a day (TID) | ORAL | Status: DC | PRN

## 2015-11-28 MED ORDER — LEVOTHYROXINE SODIUM 175 MCG PO TABS: 175.0000 ug | ORAL_TABLET | Freq: Every day | ORAL | Status: AC

## 2015-11-28 NOTE — Progress Notes (Unsigned)
I was called at 6:18 PM tonight by someone claiming to be a hospice nurse with phone number [phone] regarding this patient and wanting changes to be made tonight with a prescription for breakthrough oral medication. I explained that Dr. Marko Plume is not here and that I do not know this patient and I do not know the person was calling coincidentally has an out-of-town area code. I referred it to the hospice medical Dir. The nurse said that would be fine.

## 2015-11-28 NOTE — Telephone Encounter (Signed)
Left message on Mother's cell phone as to the refill amount and directions for the MSIR 30 mg tabs. as noted below. Levothyroxine 175 mcg refilled as well.

## 2015-11-28 NOTE — Telephone Encounter (Signed)
Mother Andriette Buchmann called stating that the frequency of the  MSIR 30 mg tablets was increased to 1 tablet every 4 hours  This weekend by Dr. Benay Spice. Daughter Debra [name] had a foley catheter placed on Friday 11-25-15 by [name],RN with Hospice.  Insertion was a little traumatic.   She had 500 cc drained from bladder.  Folwy kept in as [name] is falling frequently when trying to walk to the DR.  The urine is clear today. No blood noted as family called Hospice due to seeing blood in catheter tubing 11-27-15. Increased abdominal and loower back pain may be due to difficulty with foley insertion 11-25-15. Onueje will continue to monitor the urine.  She will get a urine specimen for UA/ C&S if it appears cloudy or foul smelling.  [name] stated that [name] was comfortable watching TV ~1430. Mother has giving MSIR  ~5hrs. ATC.  She has 3 pills left and needs a refill. Reviewed above with Dr. Marko Plume.  Dr. Marko Plume wrote prescription for MSIR 30 mg tabs: 1 every 4-6 hours as needed for pain.  #28 tabs given and can be refilled on 12-05-15.

## 2015-11-29 ENCOUNTER — Telehealth: Payer: Self-pay

## 2015-11-29 NOTE — Telephone Encounter (Signed)
Mother Pretty Dileo called and let message requesting a refill on  Bernedette's MS Contin 60 mg tablets and the Xanax. Ms. Linck has xanax 0.5 mg tabs. She has 6 tablets left and is using them three times a day and will be out 11-30-15 pm.  Given #21 for 7 day supply on 11-25-15. Refill due 12-02-15. Ms Contin 60 mg tabs.  She has 3 tablets left.  This will be enough through 11-30-15 pm(Takes on tablet 8 am and 8 pm).  Given # 60 on 11-02-15. Refill due 4-12 or 4-13. Mother requesting refill for both medications  no later then 11-30-15 due to the up coming  Easter Holiday.   Is it ok to fill the MS contin 11-30-15?  Fill the Xanax on 12-02-15?

## 2015-11-30 NOTE — Telephone Encounter (Signed)
Patient Demographics     Patient Name Sex DOB SSN Address Phone    Alois, Drath Female 22-Oct-1970 [id number] [place] [address] [phone] [facility]) [phone] (Mobile) *Preferred*      Message  Received: Norman Herrlich, MD  Baruch Merl, RN           Will fill both MS Contin and xanax on Thurs 4-13 in AM, for 1 week. We will refill these on Thursdays from here.  Let me know if you need me to tell patient's mother this plan   Thanks

## 2015-11-30 NOTE — Telephone Encounter (Signed)
Told mother [name] that the prescriptions will be filled tomorrow morning as noted below by Dr. Marko Plume and faxed to [facility].  Mother verbalized understanding.

## 2015-12-01 ENCOUNTER — Telehealth: Payer: Self-pay | Admitting: *Deleted

## 2015-12-01 ENCOUNTER — Other Ambulatory Visit: Payer: Self-pay

## 2015-12-01 ENCOUNTER — Telehealth: Payer: Self-pay

## 2015-12-01 DIAGNOSIS — C563 Malignant neoplasm of bilateral ovaries: Secondary | ICD-10-CM

## 2015-12-01 DIAGNOSIS — C561 Malignant neoplasm of right ovary: Secondary | ICD-10-CM

## 2015-12-01 DIAGNOSIS — C562 Malignant neoplasm of left ovary: Principal | ICD-10-CM

## 2015-12-01 MED ORDER — MORPHINE SULFATE ER 60 MG PO TBCR: 60.0000 mg | EXTENDED_RELEASE_TABLET | Freq: Two times a day (BID) | ORAL | Status: DC

## 2015-12-01 MED ORDER — ALPRAZOLAM 0.5 MG PO TABS: ORAL_TABLET | ORAL | Status: DC

## 2015-12-01 NOTE — Telephone Encounter (Signed)
Home health nurse called to let us know that she collected a urine specimen and sent it to the lab. She said patient was c/o some burning, urine looks clear.

## 2015-12-01 NOTE — Telephone Encounter (Signed)
Spoke with mother [name] and told her that the prescriptions were sent to [facility] this morning.

## 2015-12-01 NOTE — Telephone Encounter (Signed)
ENCOUNTER OPENED IN ERROR

## 2015-12-05 ENCOUNTER — Telehealth: Payer: Self-pay

## 2015-12-05 NOTE — Telephone Encounter (Signed)
Efrain Sella of hospice [place] called requesting VO for pt to go to [facility] for 5 night respite stay 12/05/15 to 12/10/15. VO given per Dr Marko Plume.

## 2015-12-06 ENCOUNTER — Telehealth: Payer: Self-pay

## 2015-12-06 DIAGNOSIS — C563 Malignant neoplasm of bilateral ovaries: Secondary | ICD-10-CM

## 2015-12-06 DIAGNOSIS — C561 Malignant neoplasm of right ovary: Secondary | ICD-10-CM

## 2015-12-06 DIAGNOSIS — C562 Malignant neoplasm of left ovary: Principal | ICD-10-CM

## 2015-12-06 MED ORDER — ALPRAZOLAM 0.5 MG PO TABS: ORAL_TABLET | ORAL | Status: DC

## 2015-12-06 NOTE — Telephone Encounter (Signed)
Conversations with Manufacturing engineer at hospice and Environmental consultant at [facility]. Pt has 3 alprazolam left ( she should have more like 8 left to get to Thursday). Pt has 5 MS ER left which is correct and 4 MS IR left which is correct. [facility] is calling for refills d/t her 5 day respite stay there. Will ask Dr Marko Plume for refill instructions.  She also had no advair, no keppra(on refill remains on Rx) and her mother's empty bottle for omeprazole. - wendy at hospice is working on refills for these.

## 2015-12-06 NOTE — Telephone Encounter (Signed)
When does she go to [facility]?  How long will she be there for respite? Do patients bring their own meds for the respite stay?

## 2015-12-06 NOTE — Telephone Encounter (Signed)
I forgot to mention that Debra [name] stated she lost the synthroid pills that were prescribed on 11/28/15. She is asking if we can help her with this?

## 2015-12-06 NOTE — Addendum Note (Signed)
Addended by: Janace Hoard on: 12/06/2015 02:41 PM   Modules accepted: Orders

## 2015-12-06 NOTE — Telephone Encounter (Signed)
Debra [name] is at [facility] since last night for 5 nights, 4/17 through 4/22. Rhonda or the hospice RN will pick up meds from pt's pharmacy and take them to [facility]. Yes the patients bring their own meds for respite stay.

## 2015-12-06 NOTE — Telephone Encounter (Signed)
Opened in error

## 2015-12-06 NOTE — Telephone Encounter (Signed)
Called Abigail Butts at [facility] that xanax was refilled until Thursday. Per Dr Edwyna Shell attached message.

## 2015-12-06 NOTE — Telephone Encounter (Signed)
ua results received. No new orders at present. If ua symptoms worsen - get culture per Dr Edwyna Shell

## 2015-12-06 NOTE — Telephone Encounter (Signed)
Ok for xanax to [facility] to get her to 4-20   Then on 4-20 I will do scripts for a week supply (thru 4-27) for MS extended release, MSIR and xanax. Full prescriptions should be picked up and taken to [facility], then remainder dispensed back to [facility] RN or family when patient completes respite stay. [facility] should count & record tablets received and dispensed in case any questions.    thanks

## 2015-12-07 NOTE — Telephone Encounter (Signed)
[facility] and they will refill the synthroid.  Requested it to be ready asap as mother going to pick up xanax this am.

## 2015-12-08 ENCOUNTER — Other Ambulatory Visit: Payer: Self-pay

## 2015-12-08 DIAGNOSIS — C563 Malignant neoplasm of bilateral ovaries: Secondary | ICD-10-CM

## 2015-12-08 DIAGNOSIS — C562 Malignant neoplasm of left ovary: Principal | ICD-10-CM

## 2015-12-08 DIAGNOSIS — C561 Malignant neoplasm of right ovary: Secondary | ICD-10-CM

## 2015-12-08 MED ORDER — MORPHINE SULFATE ER 60 MG PO TBCR: 60.0000 mg | EXTENDED_RELEASE_TABLET | Freq: Two times a day (BID) | ORAL | Status: DC

## 2015-12-08 MED ORDER — ALPRAZOLAM 0.5 MG PO TABS: ORAL_TABLET | ORAL | Status: DC

## 2015-12-08 MED ORDER — MORPHINE SULFATE 30 MG PO TABS: ORAL_TABLET | ORAL | Status: DC

## 2015-12-08 NOTE — Telephone Encounter (Signed)
Faxed prescription for MSIR 30 mg tabs 1 po every 4-6 hrs prn pain to [facility]. MS Contin 60 mg 1 po every 12 hrs  for pain. #14 Xanax 0.5 mg tabs tid prn  Anxiety and tremors. #21

## 2015-12-08 NOTE — Telephone Encounter (Signed)
Notified mother [name] that the prescriptions were faxed to [facility].

## 2015-12-12 ENCOUNTER — Telehealth: Payer: Self-pay

## 2015-12-12 NOTE — Telephone Encounter (Signed)
lvm heard from hospice nurse [name] earlier today, urine is now tea color in foley. The UA from last week did not show urine infection. If Natalia gets more symptoms we can run a urine culture. Any questions call [facility] or hospice.

## 2015-12-13 ENCOUNTER — Telehealth: Payer: Self-pay

## 2015-12-13 DIAGNOSIS — R300 Dysuria: Secondary | ICD-10-CM

## 2015-12-13 NOTE — Telephone Encounter (Signed)
Pyridium 200 mg tid x 7 d (or AZO)

## 2015-12-13 NOTE — Telephone Encounter (Addendum)
[name] called stating that Ms. Teng cc of burning with urinary catheter.  She took catheter out and urine was foul smelling cloudy.  Ms Fisch would not allow [name] to place new catheter.  Afebrile. Called [facility] and U/A done on 12-01-15 by Hospice was ordered with Reflex so culture not needed and done. Could Dr. Marko Plume order something for the burning?   Can let [name] know tomorrow 12-14-15.

## 2015-12-14 ENCOUNTER — Telehealth: Payer: Self-pay

## 2015-12-14 DIAGNOSIS — C561 Malignant neoplasm of right ovary: Secondary | ICD-10-CM

## 2015-12-14 DIAGNOSIS — N390 Urinary tract infection, site not specified: Secondary | ICD-10-CM

## 2015-12-14 DIAGNOSIS — C562 Malignant neoplasm of left ovary: Principal | ICD-10-CM

## 2015-12-14 DIAGNOSIS — T83511A Infection and inflammatory reaction due to indwelling urethral catheter, initial encounter: Secondary | ICD-10-CM

## 2015-12-14 DIAGNOSIS — C563 Malignant neoplasm of bilateral ovaries: Secondary | ICD-10-CM

## 2015-12-14 MED ORDER — PHENAZOPYRIDINE HCL 200 MG PO TABS: 200.0000 mg | ORAL_TABLET | Freq: Three times a day (TID) | ORAL | Status: AC

## 2015-12-14 NOTE — Telephone Encounter (Signed)
Spoke with Debra [name] and she was able to convince Debra [name] to have foley placed.   Hospice will obtain a urine culture and today and then Debra [name] can start the pyridium.    Urine culture order will be under Dr. Mariana Kaufman name and results faxed to [phone].

## 2015-12-14 NOTE — Telephone Encounter (Addendum)
Ms. Villada has 1 Ms contin 60 mg tablet left for 10 pm this evening. MSIR 30 mg tab #8 left. Xanax is due RF 12-15-15  Faxed prescriptions to [facility]

## 2015-12-15 MED ORDER — MORPHINE SULFATE 30 MG PO TABS: ORAL_TABLET | ORAL | Status: DC

## 2015-12-15 MED ORDER — ALPRAZOLAM 0.5 MG PO TABS: ORAL_TABLET | ORAL | Status: DC

## 2015-12-15 MED ORDER — MORPHINE SULFATE ER 60 MG PO TBCR: 60.0000 mg | EXTENDED_RELEASE_TABLET | Freq: Two times a day (BID) | ORAL | Status: DC

## 2015-12-15 NOTE — Telephone Encounter (Signed)
Debra [name] was able to place a new foley catheter  12-13-15.   A U/A C&S was obtained 12-14-15.   U/A results given to Dr. Marko Plume  to review.

## 2015-12-16 MED ORDER — CIPROFLOXACIN HCL 250 MG PO TABS: 250.0000 mg | ORAL_TABLET | Freq: Two times a day (BID) | ORAL | Status: AC

## 2015-12-16 NOTE — Telephone Encounter (Addendum)
Told mother [name] yesterday that if Ms. Haxton has buring with cathether or runs a temp of 101.5 or higher to call the ofice or hospice nurse. Dr. Marko Plume would begin Cipro 250 mg bid for 3 days until urine culture results. Left this same information for Hospice nurse [name]'s voice mail. Cell [phone].

## 2015-12-16 NOTE — Telephone Encounter (Signed)
Spoke with mother [name] ans told her that the urine culture from 12-14-15 shows that [place] has a UTI.  Dr. Marko Plume ordered Cipro 250 mg BID x 7 days.  Sent prescription to [facility].  Mother can begin ATB in am as pharmacy is going to close at 0900. Will notify mother if ATB needs to be changed once sensitivities are are final. Mother verbalized understanding.

## 2015-12-19 ENCOUNTER — Encounter: Payer: Self-pay | Admitting: Oncology

## 2015-12-19 NOTE — Progress Notes (Unsigned)
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  Sensitive to pip/tazo, ceftriaxone, ceftazidime, cefepime and intermediate to cipro, tobra, levaquin  Patient was started on cipro x 7 days on 12-16-15.  Godfrey Pick, MD

## 2015-12-19 NOTE — Telephone Encounter (Signed)
Dr. Marko Plume revied the sensitivity report from urine culture 12-14-15 and Cipro is appropriate for her UTI  Organism isolated.

## 2015-12-20 ENCOUNTER — Telehealth: Payer: Self-pay

## 2015-12-20 ENCOUNTER — Other Ambulatory Visit: Payer: Self-pay

## 2015-12-20 DIAGNOSIS — C569 Malignant neoplasm of unspecified ovary: Secondary | ICD-10-CM

## 2015-12-20 MED ORDER — PROCHLORPERAZINE MALEATE 10 MG PO TABS: 10.0000 mg | ORAL_TABLET | Freq: Four times a day (QID) | ORAL | Status: AC | PRN

## 2015-12-20 NOTE — Telephone Encounter (Signed)
Abigail Butts called requesting an order for DNR form to be signed by the Hospice physician as their SSW stated that the patient requested it. Renda Rolls that Dr. Marko Plume is fine with DNR form as noted above.

## 2015-12-20 NOTE — Telephone Encounter (Signed)
Hospice nurse called stating that Debra. [name] is having some nausea.  The compazine is helping with nausea.  She will need a refill.   A refill was sent to [facility].  Debra Evers is using apple sauce to get Cipro down.  She is having a little difficulty swallowing.

## 2015-12-21 ENCOUNTER — Telehealth: Payer: Self-pay

## 2015-12-21 MED ORDER — PROMETHAZINE HCL 6.25 MG/5ML PO SYRP: 25.0000 mg | ORAL_SOLUTION | Freq: Four times a day (QID) | ORAL | Status: AC | PRN

## 2015-12-21 NOTE — Telephone Encounter (Signed)
onyeje with Hospice called stating pt had called EMS because her family could not help her with a compazine suppository. [name] said Dr Lyman Speller said to call Dr Marko Plume. She is requesting that phenergan tablet be changed to liquid medication.

## 2015-12-21 NOTE — Telephone Encounter (Signed)
TC from [name]-pt's mother stating that pt continues to vomit several times a day and this has been goin gon for several days-at least 3.  She is not always able to keep meds down, including pain meds. She is due for refills tomorrow 12/22/15 of MSIR 30 mg (2 tablets remaining)and MSContin (3 tablets remaining) and alprazolam (4 tablets remaining)  Pt is not able to keep Cipro down at this time.  Pt will need refills fax'd to her pharmacy on Thursday 12/22/15 (she is a Hospice pt)  Debra [name] states the liquid phenergan will not be ready until tomorrow @ 5 pm per their pharmacy. Advised to try compazine around the clock to get stomach settled down if possible.

## 2015-12-21 NOTE — Telephone Encounter (Signed)
Phenergan ordered per Dr Edwyna Shell attached message. It takes 3 people to turn the pt and the family is unable to do suppository. onyeje will tell family just to not use the suppository and use phenergan instead. [name] mentioned the urine culture was positive, upon chart review it was found that Dr Marko Plume did see culture report from 4/26 and cipro is appropriate.  Left voice message with Suanne Marker that phenergan has been prescribed.

## 2015-12-21 NOTE — Telephone Encounter (Signed)
-----   Message from Gordy Levan, MD sent at 12/21/2015  2:39 PM EDT ----- Re phone note: onyeje with Hospice called stating pt had called EMS because her family could not help her with a compazine suppository. [name] said Dr Lyman Speller said to call Dr Marko Plume. She is requesting that phenergan tablet be changed to liquid medication.   Phenergan fine to change to liquid if that is available  I don't understand what Hospice is asking me to do about the suppository -?

## 2015-12-22 ENCOUNTER — Telehealth: Payer: Self-pay | Admitting: *Deleted

## 2015-12-22 ENCOUNTER — Telehealth: Payer: Self-pay

## 2015-12-22 ENCOUNTER — Other Ambulatory Visit: Payer: Self-pay

## 2015-12-22 DIAGNOSIS — T83511A Infection and inflammatory reaction due to indwelling urethral catheter, initial encounter: Secondary | ICD-10-CM

## 2015-12-22 DIAGNOSIS — N390 Urinary tract infection, site not specified: Secondary | ICD-10-CM

## 2015-12-22 DIAGNOSIS — C562 Malignant neoplasm of left ovary: Principal | ICD-10-CM

## 2015-12-22 DIAGNOSIS — C561 Malignant neoplasm of right ovary: Secondary | ICD-10-CM

## 2015-12-22 DIAGNOSIS — C563 Malignant neoplasm of bilateral ovaries: Secondary | ICD-10-CM

## 2015-12-22 MED ORDER — ALPRAZOLAM 0.5 MG PO TABS: ORAL_TABLET | ORAL | Status: AC

## 2015-12-22 MED ORDER — MORPHINE SULFATE 30 MG PO TABS: ORAL_TABLET | ORAL | Status: AC

## 2015-12-22 MED ORDER — MORPHINE SULFATE ER 60 MG PO TBCR: 60.0000 mg | EXTENDED_RELEASE_TABLET | Freq: Two times a day (BID) | ORAL | Status: AC

## 2015-12-22 MED ORDER — LORAZEPAM 1 MG PO TABS: ORAL_TABLET | ORAL | Status: AC

## 2015-12-22 NOTE — Telephone Encounter (Signed)
TC from [name] stating that Debra [name] is out of Xanax, MSIR and MSContin. Spoke with [name], RN with Dr. Marko Plume and prescriptions will be fax'd to their pharmacy today.  Pt is scheduled to go to [facility] when a bed is available-hopefully today but it may be tomorrow. [name] to call us when pt does go to [facility].

## 2015-12-22 NOTE — Telephone Encounter (Signed)
Call from [facility] with Hospice.  "We're transferring patient to [facility] as a G.I.P., general in-patient.  We can't do anything in the home to help her.  She is nauseated and vomiting.  I called yesterday and did not receive return call in reference to the urine culture [facility] lab faxed result to your office.

## 2015-12-22 NOTE — Telephone Encounter (Signed)
Spoke with Mother [name].

## 2015-12-22 NOTE — Telephone Encounter (Signed)
Told Ms.Debra [name] that that [employer], Rn at  [facility] said that Dr. Jenene Slicker, hospice physician was going to see daughter Debra [name] at [facility].  He will thenorder her medications for pain , tremors, and nausea.  She does not need to bring the medications from home.   She will  receive narcotic prescriptions prior to her discharge to home.  Gave [name] the dosing on the pain meds and xanax. Told [name] that patient Debra [name] has a UTI and is on Cipro for 7 days. She cannot keep cipro down.the last few days. She will also receive prescriptions prior to her discharge to home.

## 2015-12-29 ENCOUNTER — Encounter: Payer: Self-pay | Admitting: Oncology

## 2015-12-29 NOTE — Progress Notes (Signed)
Medical Oncology  Notification by Hospice that patient died at [facility] on 16-Jan-2016 at 1128.  Sympathy letter written to family. Will let other MDs know, as patient and I both appreciated all of their help with her care.  Godfrey Pick, MD

## 2016-01-19 DEATH — deceased

## 2016-02-06 ENCOUNTER — Other Ambulatory Visit: Payer: Self-pay | Admitting: Nurse Practitioner

## 2016-09-07 IMAGING — CR DG RIBS W/ CHEST 3+V*L*
4 series · 4 of 4 positions shown · non-contrast
Comparison: Chest x-ray 09/15/2015

CLINICAL DATA: Fall with left anterior rib pain

EXAM:
LEFT RIBS AND CHEST - 3+ VIEW

[t chest supine]
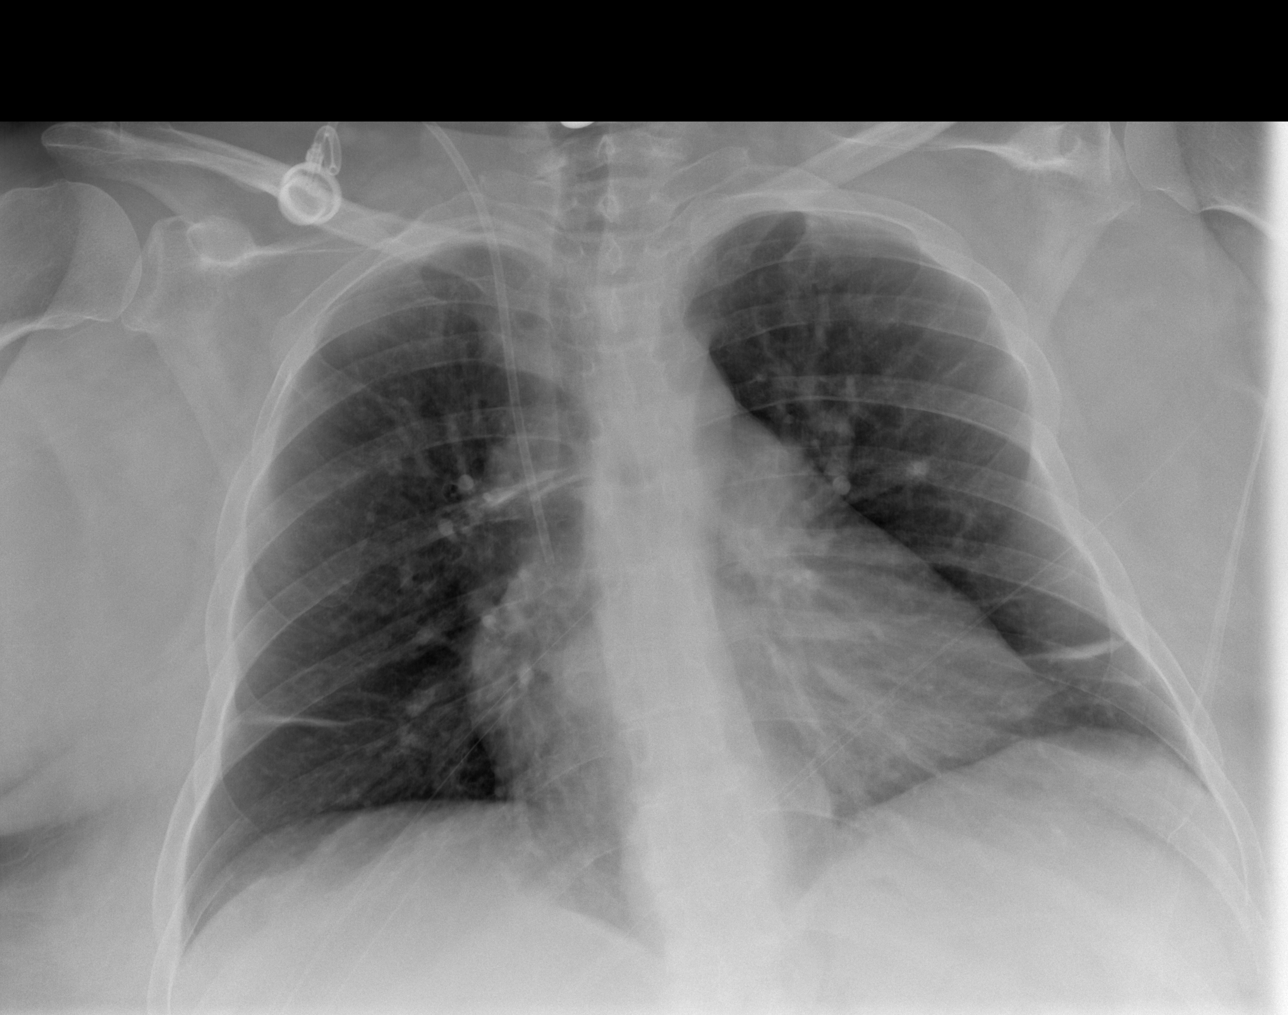

[t ribs ap upper left]
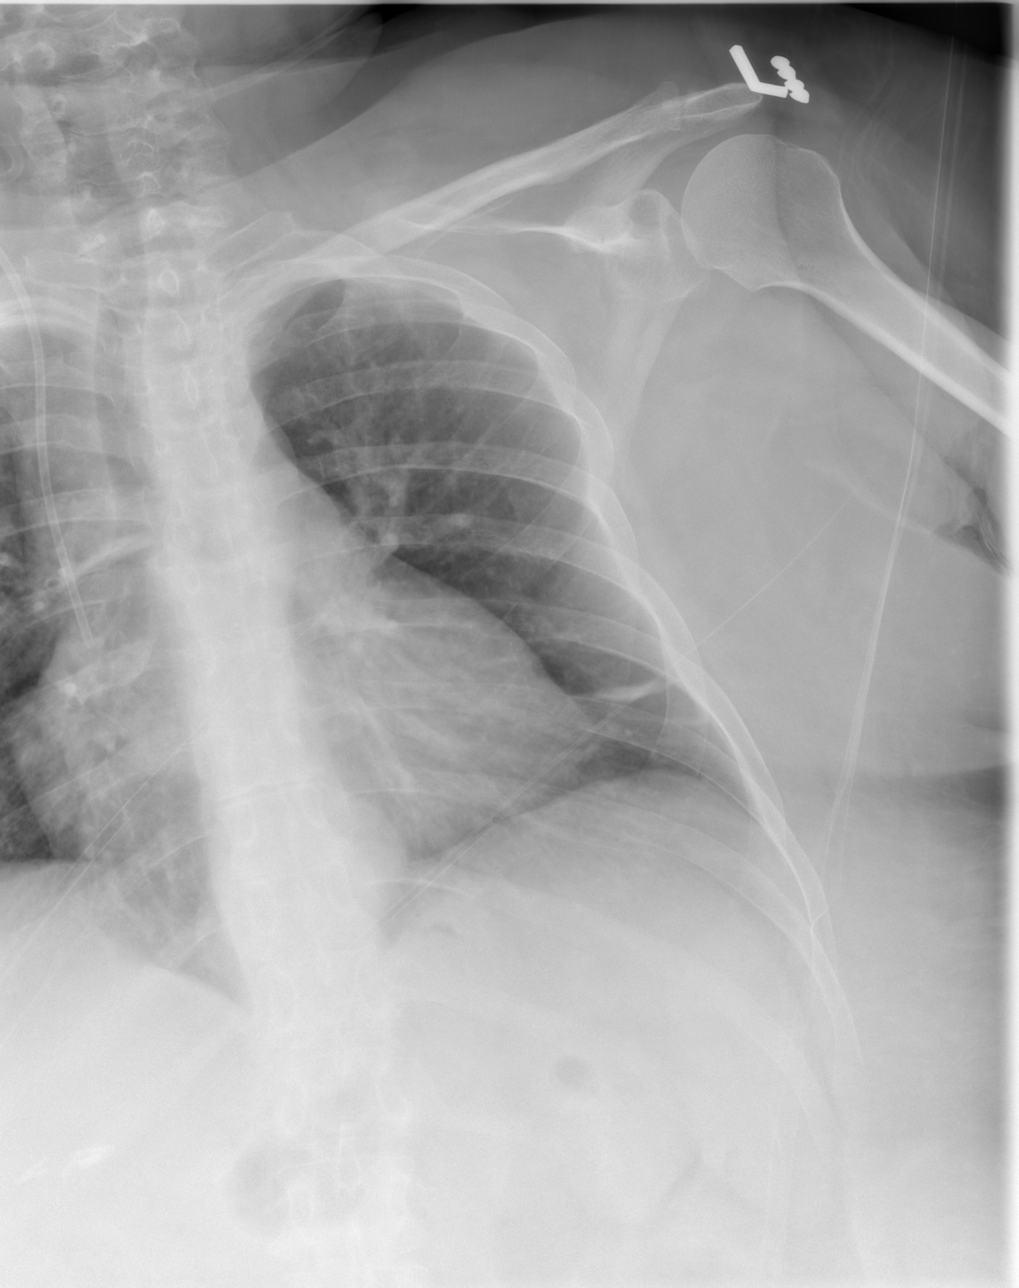

[t ribs ap lower left]
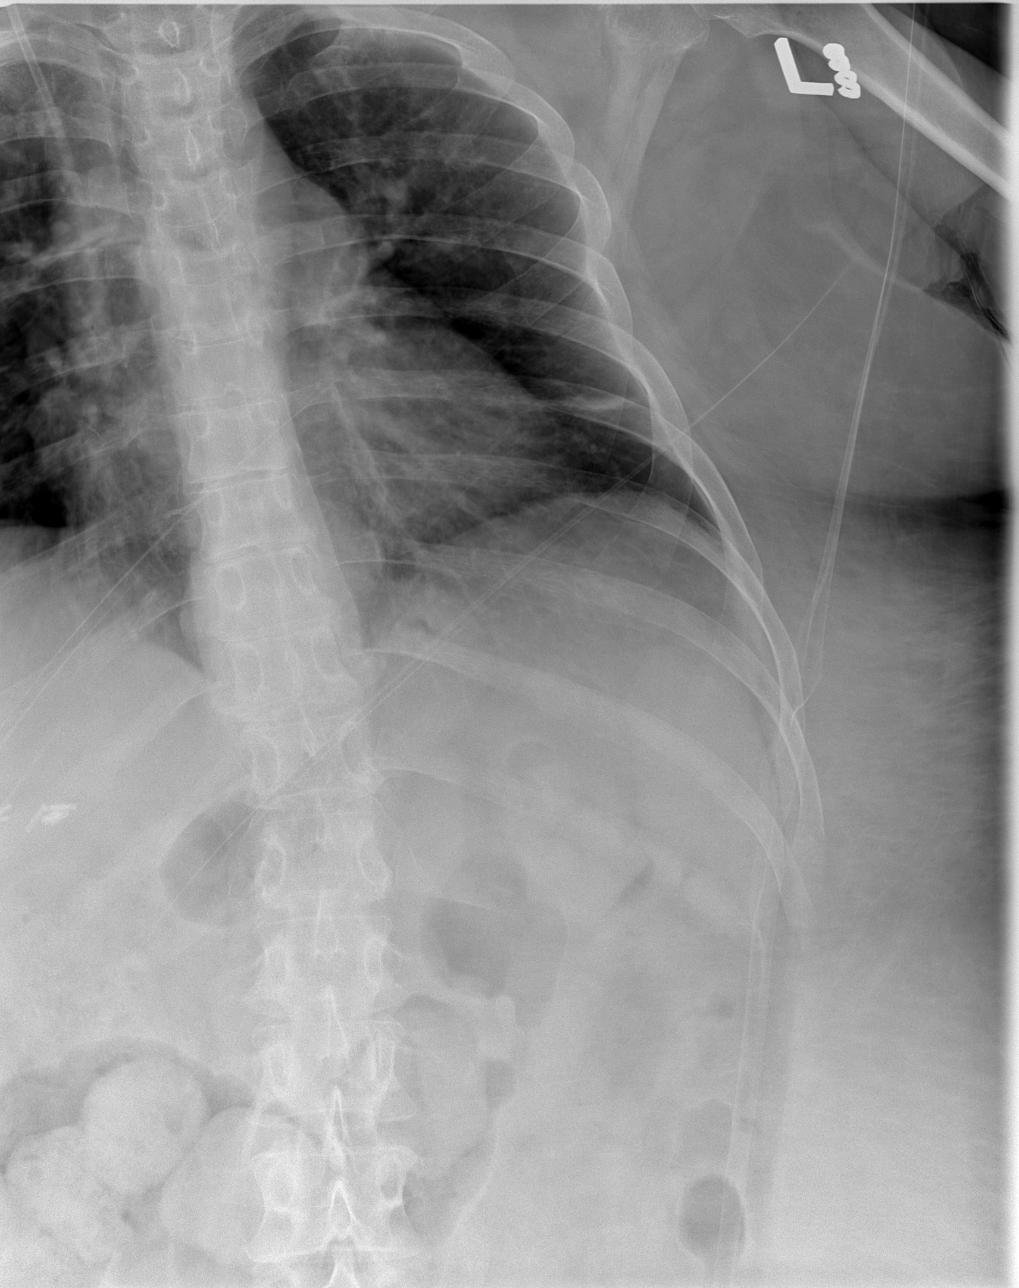

[t ribs lpo left]
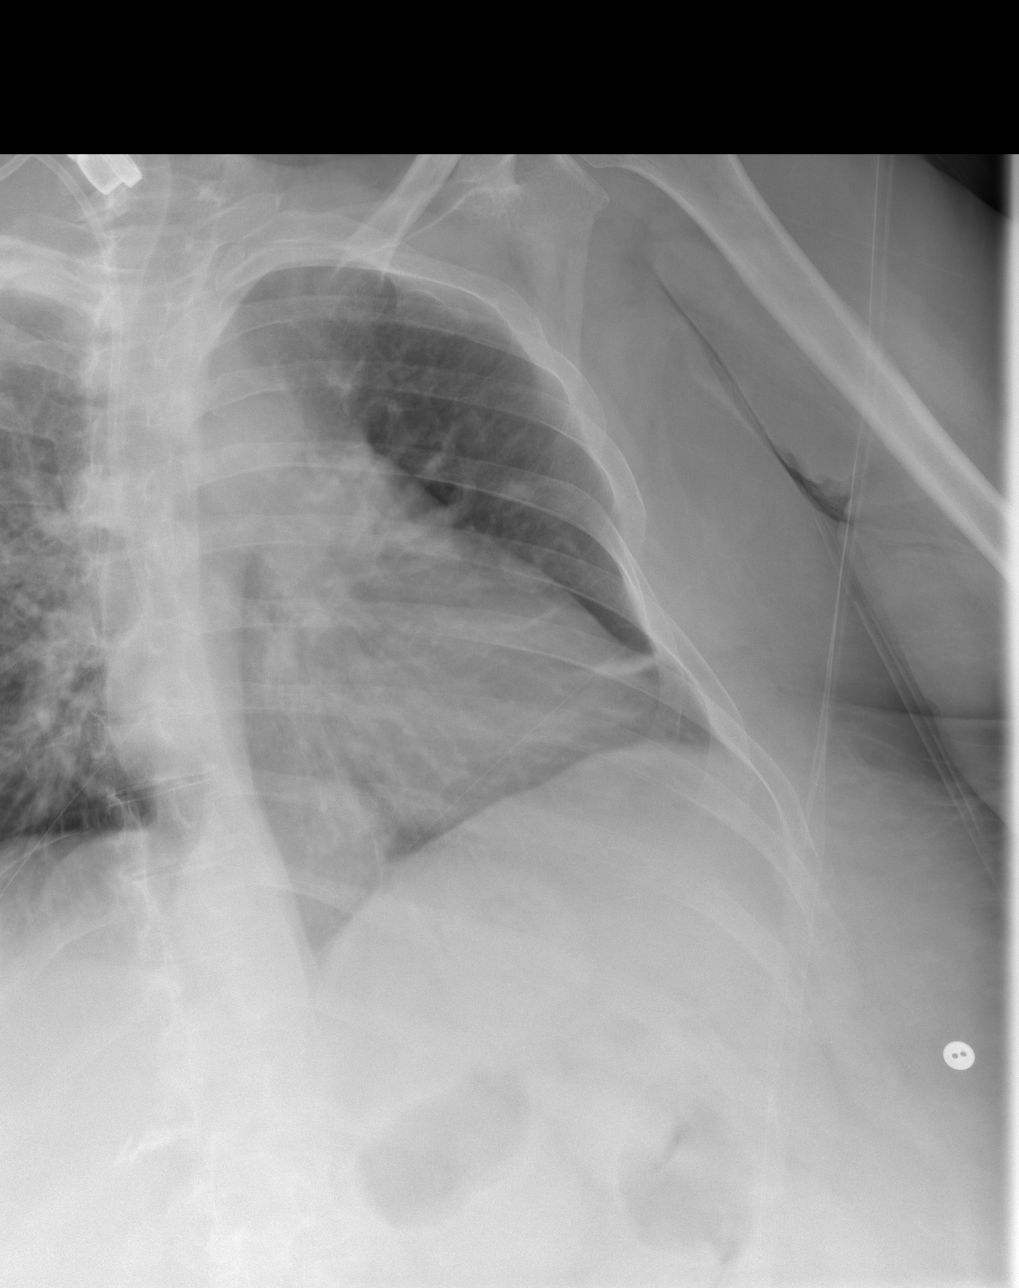

[4 of 4 positions shown; findings below may reference images not displayed]

FINDINGS: Right IJ porta catheter with tip in good position at the upper
cavoatrial junction.

Mild bilateral atelectasis. Stable borderline cardiomegaly. Mild
widening of the lower mediastinum correlates with fat on 7471 chest
CT. There is no edema, consolidation, effusion, or pneumothorax.

Negative for rib fracture. Incidental bifid anterior left fifth rib.
IMPRESSION: 1. Negative for acute fracture or pneumothorax.
2. Mild bilateral atelectasis.
